# Patient Record
Sex: Female | Born: 1950 | Race: White | Hispanic: No | Marital: Married
Health system: Southern US, Community
[De-identification: ages and names within clinical notes are randomized; demographics above are authoritative.]

## PROBLEM LIST (undated history)

## (undated) DIAGNOSIS — I4891 Unspecified atrial fibrillation: Secondary | ICD-10-CM

## (undated) DIAGNOSIS — I639 Cerebral infarction, unspecified: Secondary | ICD-10-CM

## (undated) DIAGNOSIS — R0600 Dyspnea, unspecified: Secondary | ICD-10-CM

## (undated) DIAGNOSIS — Z95810 Presence of automatic (implantable) cardiac defibrillator: Secondary | ICD-10-CM

## (undated) DIAGNOSIS — I313 Pericardial effusion (noninflammatory): Secondary | ICD-10-CM

## (undated) DIAGNOSIS — E7800 Pure hypercholesterolemia, unspecified: Secondary | ICD-10-CM

## (undated) DIAGNOSIS — I499 Cardiac arrhythmia, unspecified: Secondary | ICD-10-CM

## (undated) DIAGNOSIS — K269 Duodenal ulcer, unspecified as acute or chronic, without hemorrhage or perforation: Secondary | ICD-10-CM

## (undated) DIAGNOSIS — M1990 Unspecified osteoarthritis, unspecified site: Secondary | ICD-10-CM

## (undated) DIAGNOSIS — I3139 Other pericardial effusion (noninflammatory): Secondary | ICD-10-CM

## (undated) DIAGNOSIS — I10 Essential (primary) hypertension: Secondary | ICD-10-CM

## (undated) DIAGNOSIS — I429 Cardiomyopathy, unspecified: Secondary | ICD-10-CM

## (undated) HISTORY — PX: FOOT SURGERY: SHX648

## (undated) HISTORY — DX: Presence of automatic (implantable) cardiac defibrillator: Z95.810

## (undated) HISTORY — DX: Duodenal ulcer, unspecified as acute or chronic, without hemorrhage or perforation: K26.9

## (undated) HISTORY — PX: ABDOMINAL HYSTERECTOMY: SHX81

---

## 1997-12-26 ENCOUNTER — Encounter: Admission: RE | Admit: 1997-12-26 | Discharge: 1998-03-26 | Payer: Self-pay | Admitting: Family Medicine

## 2000-08-22 ENCOUNTER — Emergency Department (HOSPITAL_COMMUNITY): Admission: EM | Admit: 2000-08-22 | Discharge: 2000-08-22 | Payer: Self-pay | Admitting: Emergency Medicine

## 2000-08-22 ENCOUNTER — Encounter: Payer: Self-pay | Admitting: Emergency Medicine

## 2001-03-09 ENCOUNTER — Emergency Department (HOSPITAL_COMMUNITY): Admission: EM | Admit: 2001-03-09 | Discharge: 2001-03-09 | Payer: Self-pay | Admitting: Emergency Medicine

## 2001-03-09 ENCOUNTER — Encounter: Payer: Self-pay | Admitting: Emergency Medicine

## 2001-03-26 ENCOUNTER — Encounter: Admission: RE | Admit: 2001-03-26 | Discharge: 2001-04-13 | Payer: Self-pay | Admitting: Family Medicine

## 2001-04-08 ENCOUNTER — Encounter: Payer: Self-pay | Admitting: Emergency Medicine

## 2001-04-08 ENCOUNTER — Emergency Department (HOSPITAL_COMMUNITY): Admission: EM | Admit: 2001-04-08 | Discharge: 2001-04-09 | Payer: Self-pay | Admitting: Emergency Medicine

## 2001-07-03 ENCOUNTER — Emergency Department (HOSPITAL_COMMUNITY): Admission: EM | Admit: 2001-07-03 | Discharge: 2001-07-03 | Payer: Self-pay | Admitting: *Deleted

## 2001-09-04 ENCOUNTER — Encounter: Payer: Self-pay | Admitting: Family Medicine

## 2001-09-04 ENCOUNTER — Ambulatory Visit (HOSPITAL_COMMUNITY): Admission: RE | Admit: 2001-09-04 | Discharge: 2001-09-04 | Payer: Self-pay | Admitting: Family Medicine

## 2002-05-11 ENCOUNTER — Ambulatory Visit (HOSPITAL_COMMUNITY): Admission: RE | Admit: 2002-05-11 | Discharge: 2002-05-11 | Payer: Self-pay | Admitting: Gastroenterology

## 2002-05-11 ENCOUNTER — Encounter: Payer: Self-pay | Admitting: Gastroenterology

## 2002-05-11 ENCOUNTER — Encounter (INDEPENDENT_AMBULATORY_CARE_PROVIDER_SITE_OTHER): Payer: Self-pay | Admitting: Specialist

## 2002-05-13 ENCOUNTER — Ambulatory Visit (HOSPITAL_COMMUNITY): Admission: RE | Admit: 2002-05-13 | Discharge: 2002-05-13 | Payer: Self-pay | Admitting: Gastroenterology

## 2002-11-11 ENCOUNTER — Ambulatory Visit (HOSPITAL_COMMUNITY): Admission: RE | Admit: 2002-11-11 | Discharge: 2002-11-11 | Payer: Self-pay | Admitting: Gastroenterology

## 2002-11-18 ENCOUNTER — Ambulatory Visit (HOSPITAL_COMMUNITY): Admission: RE | Admit: 2002-11-18 | Discharge: 2002-11-18 | Payer: Self-pay | Admitting: Family Medicine

## 2002-11-18 ENCOUNTER — Encounter: Payer: Self-pay | Admitting: Family Medicine

## 2003-07-27 ENCOUNTER — Encounter: Admission: RE | Admit: 2003-07-27 | Discharge: 2003-10-25 | Payer: Self-pay | Admitting: Family Medicine

## 2003-10-27 ENCOUNTER — Encounter: Admission: RE | Admit: 2003-10-27 | Discharge: 2003-12-19 | Payer: Self-pay | Admitting: Family Medicine

## 2003-12-07 ENCOUNTER — Ambulatory Visit (HOSPITAL_COMMUNITY): Admission: RE | Admit: 2003-12-07 | Discharge: 2003-12-07 | Payer: Self-pay | Admitting: Family Medicine

## 2004-01-24 ENCOUNTER — Encounter: Admission: RE | Admit: 2004-01-24 | Discharge: 2004-04-23 | Payer: Self-pay | Admitting: Orthopedic Surgery

## 2004-02-02 ENCOUNTER — Emergency Department (HOSPITAL_COMMUNITY): Admission: EM | Admit: 2004-02-02 | Discharge: 2004-02-02 | Payer: Self-pay | Admitting: Family Medicine

## 2004-12-12 ENCOUNTER — Ambulatory Visit (HOSPITAL_COMMUNITY): Admission: RE | Admit: 2004-12-12 | Discharge: 2004-12-12 | Payer: Self-pay | Admitting: Family Medicine

## 2005-09-25 ENCOUNTER — Encounter: Admission: RE | Admit: 2005-09-25 | Discharge: 2005-09-25 | Payer: Self-pay | Admitting: Otolaryngology

## 2005-09-26 ENCOUNTER — Encounter: Admission: RE | Admit: 2005-09-26 | Discharge: 2005-12-25 | Payer: Self-pay | Admitting: Psychiatry

## 2005-12-13 ENCOUNTER — Ambulatory Visit (HOSPITAL_COMMUNITY): Admission: RE | Admit: 2005-12-13 | Discharge: 2005-12-13 | Payer: Self-pay | Admitting: Family Medicine

## 2006-01-22 ENCOUNTER — Encounter: Admission: RE | Admit: 2006-01-22 | Discharge: 2006-01-22 | Payer: Self-pay | Admitting: Psychiatry

## 2006-11-28 ENCOUNTER — Ambulatory Visit (HOSPITAL_COMMUNITY): Admission: RE | Admit: 2006-11-28 | Discharge: 2006-11-29 | Payer: Self-pay | Admitting: Otolaryngology

## 2006-11-28 ENCOUNTER — Encounter (INDEPENDENT_AMBULATORY_CARE_PROVIDER_SITE_OTHER): Payer: Self-pay | Admitting: Specialist

## 2006-12-25 ENCOUNTER — Emergency Department (HOSPITAL_COMMUNITY): Admission: EM | Admit: 2006-12-25 | Discharge: 2006-12-25 | Payer: Self-pay | Admitting: Emergency Medicine

## 2007-01-23 ENCOUNTER — Ambulatory Visit (HOSPITAL_COMMUNITY): Admission: RE | Admit: 2007-01-23 | Discharge: 2007-01-23 | Payer: Self-pay | Admitting: Family Medicine

## 2008-01-25 ENCOUNTER — Ambulatory Visit (HOSPITAL_COMMUNITY): Admission: RE | Admit: 2008-01-25 | Discharge: 2008-01-25 | Payer: Self-pay | Admitting: Family Medicine

## 2009-01-31 ENCOUNTER — Ambulatory Visit (HOSPITAL_COMMUNITY): Admission: RE | Admit: 2009-01-31 | Discharge: 2009-01-31 | Payer: Self-pay | Admitting: Family Medicine

## 2010-02-01 ENCOUNTER — Ambulatory Visit (HOSPITAL_COMMUNITY): Admission: RE | Admit: 2010-02-01 | Discharge: 2010-02-01 | Payer: Self-pay | Admitting: Cardiology

## 2010-08-04 ENCOUNTER — Emergency Department (HOSPITAL_COMMUNITY): Admission: EM | Admit: 2010-08-04 | Discharge: 2010-08-04 | Payer: Self-pay | Admitting: Emergency Medicine

## 2010-11-27 LAB — GLUCOSE, CAPILLARY: Glucose-Capillary: 141 mg/dL — ABNORMAL HIGH (ref 70–99)

## 2011-01-09 ENCOUNTER — Other Ambulatory Visit (HOSPITAL_COMMUNITY): Payer: Self-pay | Admitting: Family Medicine

## 2011-01-09 DIAGNOSIS — Z1231 Encounter for screening mammogram for malignant neoplasm of breast: Secondary | ICD-10-CM

## 2011-02-01 NOTE — Op Note (Signed)
NAME:  Linda Lowe, Linda Lowe                            ACCOUNT NO.:  [id number]   MEDICAL RECORD NO.:  [id number]                   PATIENT TYPE:  AMB   LOCATION:  ENDO                                 FACILITY:  [facility]   PHYSICIAN:  Anselmo Rod, M.D.               DATE OF BIRTH:  1951-08-27   DATE OF PROCEDURE:  11/11/2002  DATE OF DISCHARGE:                                 OPERATIVE REPORT   PROCEDURE:  Screening colonoscopy.   ENDOSCOPIST:  Anselmo Rod, M.D.   INSTRUMENT USED:  Olympus video colonoscope.   INDICATION FOR PROCEDURE:  A 60 year old African-American female undergoing  screening colonoscopy.  Rule out colonic polyps, masses, etc.   PREPROCEDURE PREPARATION:  Informed consent was procured from the patient.  The patient fasted for eight hours prior to the procedure and prepped with a  bottle of Miralax and Gatorade the night prior to the procedure.   PREPROCEDURE PHYSICAL:  VITAL SIGNS:  The patient had stable vital signs.  NECK:  Supple.  CHEST:  Clear to auscultation.  S1, S2 regular.  ABDOMEN:  Soft with normal bowel sounds.   DESCRIPTION OF PROCEDURE:  The patient was placed in the left lateral  decubitus position and sedated with 50 mg of Demerol and 5 mg of Versed  intravenously.  Once the patient was adequately sedate and maintained on low-  flow oxygen and continuous cardiac monitoring, the Olympus video colonoscope  was advanced from the rectum to the cecum with slight difficulty.  There was  some residual stool in the colon.  Multiple washes were done.  No masses or  polyps were seen.  There was evidence of a few early right-sided  diverticula.  The appendiceal orifice and the ileocecal valve were clearly  visualized and photographed.  The terminal ileum appeared normal.  Small,  nonbleeding internal hemorrhoids were seen on retroflexion.  The patient  tolerated the procedure well without complications.   IMPRESSION:  1. Small, nonbleeding internal  hemorrhoids.  2. A few early right-sided diverticula.  3. No masses or polyps seen.  4. Normal-appearing terminal ileum.   RECOMMENDATIONS:  1. A high-fiber diet with liberal fluid intake has been advocated.     Brochures on diverticulosis have been given to the patient for her     education.  2.     Repeat CRC screening is recommended in the next 10 years unless the patient      develops any abnormal symptoms in the interim.  3. Outpatient follow-up on a p.r.n. basis.                                               Anselmo Rod, M.D.    JNM/MEDQ  D:  11/11/2002  T:  11/11/2002  Job:  [id number]   cc:   Renaye Rakers, M.D.  [phone] [address]  [place]  [place] [address]  Fax: [phone]

## 2011-02-01 NOTE — Op Note (Signed)
NAMETAMILYN, LUPIEN NO.:  [id number]   MEDICAL RECORD NO.:  [id number]          PATIENT TYPE:  OIB   LOCATION:  3305                         FACILITY:  [facility]   PHYSICIAN:  Suzanna Obey, M.D.       DATE OF BIRTH:  1950/12/20   DATE OF PROCEDURE:  11/28/2006  DATE OF DISCHARGE:  11/29/2006                               OPERATIVE REPORT   PREOPERATIVE DIAGNOSIS:  Obstructive sleep apnea with deviated septum  and turbinate hypertrophy.   POSTOPERATIVE DIAGNOSIS:  Obstructive sleep apnea with deviated septum  and turbinate hypertrophy.   SURGICAL PROCEDURES:  Septoplasty, submucous resection of inferior  turbinates, uvulopharyngeal palatoplasty, and tonsillectomy.   ANESTHESIA:  General.   ESTIMATED BLOOD LOSS:  Approximately 10 mL.   INDICATIONS:  This is a 60 year old with significance obstructive sleep  apnea that has failed CPAP.  She now wants to proceed with the he  surgery and understands the risks of bleeding, infection, scarring,  velopharyngeal insufficiency, change in the voice, chronic crusting and  drying, septal perforation, change in the external appearance of the  nose, injury to the teeth, and risk of the anesthetic.  She also  understands that there is about a 60% at best cure rate of the sleep  apnea from surgery.  All questions were answered and consent was  obtained.   OPERATION:  The patient was taken to the operating room and placed in  supine position. After adequate general endotracheal tube anesthesia,  was placed in supine position, prepped and draped in the usual sterile  manner.  The septum and inferior turbinates were injected with 1%  lidocaine with 1:100,000 epinephrine.  A left hemitransfixion incision  was performed raising a mucoperichondrial and mucoperiosteal flap.  The  cautery was used to divide about 2 cm posterior to the caudal strut and  the cartilage was removed with a Therapist, nutritional.  Portions of the bone  were  removed with the Glorious Peach, as well, and the Takahashi forceps.  This  corrected the septal deflection.  The turbinates were infractured, a  midline incision made with a 15 blade, the mucosal flap elevated  superiorly and the inferior  mucosa and bone were removed with the  turbinate scissors.  The edges was cauterized with suction cautery and  the flaps were laid back down over the raw surface and both turbinates  were outfractured.  The hemitransfixion incision was closed with  interrupted 4-0 chromic and a 4-0 plain gut quilting stitch placed in  the septum.  Telfa rolls soaked in bacitracin were placed in the nose  bilaterally and secured with a 3-0 nylon.   The oral cavity and oropharynx was then addressed.  A Crowe-Davis mouth  gag was inserted, retracted, and suspended from the Mayo stand.  The  palate was cut just at the base of the uvula and the dissection was done  with electrocautery carrying into the left tonsillar fossa where the  capsule of the tonsil was identified and removed with electrocautery  dissection. The dissection was carried across the palate to the other  tonsil and it was also removed to along its capsule and the tissue,  tonsils, and uvula with a portion of the palate were removed en bloc.  Suction cautery was used to gain hemostasis.  The tonsillar fossae and  palate were closed with interrupted 3-0 Vicryl.  Hypopharynx, esophagus, and stomach were suctioned with the NG tube.  The Crowe-Davis was released and resuspended, there was hemostasis  present in all locations.  The patient is awakened and brought to  recovery in stable condition.  Counts correct.           ______________________________  Suzanna Obey, M.D.     JB/MEDQ  D:  11/28/2006  T:  11/29/2006  Job:  [id number]   cc:   Renaye Rakers, M.D.

## 2011-02-01 NOTE — Op Note (Signed)
NAME:  CINCERE, DEPREY                            ACCOUNT NO.:  [id number]   MEDICAL RECORD NO.:  [id number]                   PATIENT TYPE:  AMB   LOCATION:  ENDO                                 FACILITY:  [facility]   PHYSICIAN:  Charna Elizabeth, M.D.                   DATE OF BIRTH:  12-16-1950   DATE OF PROCEDURE:  05/13/2002  DATE OF DISCHARGE:                                 OPERATIVE REPORT   PROCEDURE:  Esophagogastroduodenoscopy with biopsy.   ENDOSCOPIST:  Charna Elizabeth, M.D.   INSTRUMENT USED:  Olympus video panendoscope.   INDICATIONS FOR PROCEDURE:  Nausea and vomiting and epigastric pain with  abnormal abdominal ultrasound and HIDA scan in 60 year old female, rule out  peptic ulcer disease, esophagitis, gastric outlet obstruction.   PREPROCEDURE PREPARATION:  Informed consent was obtained from the patient.  The patient was fasted for eight hours prior to the procedure.   PREPROCEDURE PHYSICAL:  Patient with stable vital signs.  Neck supple.  Chest clear to auscultation.  S1 and S2 regular.  Abdomen soft with normal  bowel sounds.   DESCRIPTION OF PROCEDURE:  The patient was placed in the left lateral  decubitus position, sedated with 60 mg of Demerol and 6 mg Versed  intravenously.  Once the patient was adequately sedated, maintained on low  flow oxygen, continuous cardiac monitoring, the Olympus video panendoscope  was advanced through the mouth piece over the tongue into the esophagus  under direct vision.  The entire esophagus appeared normal without any  evidence of ring, strictures, masses, esophagitis, or Barrett's mucosa.  The  scope was then advanced into the stomach.  There were multiple small antral  and mid body ulcers seen with old heme overlying the small ulcers, there was  no evidence of visible vessels.  Biopsies were done from these areas to rule  out H. pylori.  The proximal small bowel appeared normal and without  lesions.  There was no evidence of outlet  obstruction.   IMPRESSION:  1. Multiple mid body and antral ulcerations, small punched out lesions with     old heme in them, no visible vessels present.  2. Normal appearing esophagus and proximal small bowel.   RECOMMENDATIONS:  1. Prevacid 30 mg 1 p.o. q.d., samples were given to the patient from the     office.  2.     Avoid all nonsteroidals including aspirin has been advised for now.  3. Treat with antibiotics if H. pylori present on pathology.  4. Outpatient follow up in the next two weeks for further recommendations.                                               Charna Elizabeth, M.D.    JM/MEDQ  D:  05/13/2002  T:  05/17/2002  Job:  [id number]   cc:   Geraldo Pitter, M.D.

## 2011-02-04 ENCOUNTER — Ambulatory Visit (HOSPITAL_COMMUNITY)
Admission: RE | Admit: 2011-02-04 | Discharge: 2011-02-04 | Disposition: A | Discharge status: Home / Self Care | Payer: Federal, State, Local not specified - PPO | Source: Ambulatory Visit | Attending: Family Medicine | Admitting: Family Medicine

## 2011-02-04 DIAGNOSIS — Z1231 Encounter for screening mammogram for malignant neoplasm of breast: Secondary | ICD-10-CM

## 2011-04-09 ENCOUNTER — Ambulatory Visit
Admission: RE | Admit: 2011-04-09 | Discharge: 2011-04-09 | Disposition: A | Discharge status: Home / Self Care | Payer: Federal, State, Local not specified - PPO | Source: Ambulatory Visit | Attending: Family Medicine | Admitting: Family Medicine

## 2011-04-09 ENCOUNTER — Other Ambulatory Visit: Payer: Self-pay | Admitting: Family Medicine

## 2011-04-09 DIAGNOSIS — M1250 Traumatic arthropathy, unspecified site: Secondary | ICD-10-CM

## 2011-11-06 DIAGNOSIS — IMO0001 Reserved for inherently not codable concepts without codable children: Secondary | ICD-10-CM | POA: Diagnosis not present

## 2011-12-18 DIAGNOSIS — E7800 Pure hypercholesterolemia, unspecified: Secondary | ICD-10-CM | POA: Diagnosis not present

## 2011-12-18 DIAGNOSIS — IMO0001 Reserved for inherently not codable concepts without codable children: Secondary | ICD-10-CM | POA: Diagnosis not present

## 2011-12-18 DIAGNOSIS — I10 Essential (primary) hypertension: Secondary | ICD-10-CM | POA: Diagnosis not present

## 2012-01-22 ENCOUNTER — Other Ambulatory Visit (HOSPITAL_COMMUNITY): Payer: Self-pay | Admitting: Family Medicine

## 2012-01-22 DIAGNOSIS — Z1231 Encounter for screening mammogram for malignant neoplasm of breast: Secondary | ICD-10-CM

## 2012-02-17 ENCOUNTER — Ambulatory Visit (HOSPITAL_COMMUNITY)
Admission: RE | Admit: 2012-02-17 | Discharge: 2012-02-17 | Disposition: A | Discharge status: Home / Self Care | Payer: Federal, State, Local not specified - PPO | Source: Ambulatory Visit | Attending: Family Medicine | Admitting: Family Medicine

## 2012-02-17 DIAGNOSIS — Z1231 Encounter for screening mammogram for malignant neoplasm of breast: Secondary | ICD-10-CM | POA: Diagnosis not present

## 2012-02-20 DIAGNOSIS — I10 Essential (primary) hypertension: Secondary | ICD-10-CM | POA: Diagnosis not present

## 2012-02-20 DIAGNOSIS — IMO0001 Reserved for inherently not codable concepts without codable children: Secondary | ICD-10-CM | POA: Diagnosis not present

## 2012-02-20 DIAGNOSIS — R809 Proteinuria, unspecified: Secondary | ICD-10-CM | POA: Diagnosis not present

## 2012-02-20 DIAGNOSIS — E7800 Pure hypercholesterolemia, unspecified: Secondary | ICD-10-CM | POA: Diagnosis not present

## 2012-04-23 DIAGNOSIS — IMO0001 Reserved for inherently not codable concepts without codable children: Secondary | ICD-10-CM | POA: Diagnosis not present

## 2012-04-23 DIAGNOSIS — I10 Essential (primary) hypertension: Secondary | ICD-10-CM | POA: Diagnosis not present

## 2012-04-23 DIAGNOSIS — E7800 Pure hypercholesterolemia, unspecified: Secondary | ICD-10-CM | POA: Diagnosis not present

## 2012-05-23 ENCOUNTER — Inpatient Hospital Stay (HOSPITAL_COMMUNITY)
Admission: EM | Admit: 2012-05-23 | Discharge: 2012-05-27 | DRG: 544 | Disposition: A | Discharge status: Home / Self Care | Payer: Federal, State, Local not specified - PPO | Attending: Internal Medicine | Admitting: Internal Medicine

## 2012-05-23 ENCOUNTER — Emergency Department (HOSPITAL_COMMUNITY): Payer: Federal, State, Local not specified - PPO

## 2012-05-23 ENCOUNTER — Encounter (HOSPITAL_COMMUNITY): Payer: Self-pay | Admitting: Physical Medicine and Rehabilitation

## 2012-05-23 DIAGNOSIS — I319 Disease of pericardium, unspecified: Secondary | ICD-10-CM | POA: Diagnosis present

## 2012-05-23 DIAGNOSIS — I509 Heart failure, unspecified: Secondary | ICD-10-CM | POA: Diagnosis present

## 2012-05-23 DIAGNOSIS — I447 Left bundle-branch block, unspecified: Secondary | ICD-10-CM | POA: Diagnosis present

## 2012-05-23 DIAGNOSIS — Z794 Long term (current) use of insulin: Secondary | ICD-10-CM | POA: Diagnosis not present

## 2012-05-23 DIAGNOSIS — Z79899 Other long term (current) drug therapy: Secondary | ICD-10-CM | POA: Diagnosis not present

## 2012-05-23 DIAGNOSIS — I5021 Acute systolic (congestive) heart failure: Principal | ICD-10-CM | POA: Diagnosis present

## 2012-05-23 DIAGNOSIS — E119 Type 2 diabetes mellitus without complications: Secondary | ICD-10-CM | POA: Diagnosis not present

## 2012-05-23 DIAGNOSIS — J189 Pneumonia, unspecified organism: Secondary | ICD-10-CM | POA: Diagnosis present

## 2012-05-23 DIAGNOSIS — M129 Arthropathy, unspecified: Secondary | ICD-10-CM | POA: Diagnosis present

## 2012-05-23 DIAGNOSIS — E7800 Pure hypercholesterolemia, unspecified: Secondary | ICD-10-CM | POA: Diagnosis present

## 2012-05-23 DIAGNOSIS — R599 Enlarged lymph nodes, unspecified: Secondary | ICD-10-CM | POA: Diagnosis present

## 2012-05-23 DIAGNOSIS — I10 Essential (primary) hypertension: Secondary | ICD-10-CM | POA: Diagnosis not present

## 2012-05-23 DIAGNOSIS — R112 Nausea with vomiting, unspecified: Secondary | ICD-10-CM | POA: Diagnosis present

## 2012-05-23 DIAGNOSIS — R110 Nausea: Secondary | ICD-10-CM | POA: Diagnosis not present

## 2012-05-23 DIAGNOSIS — I313 Pericardial effusion (noninflammatory): Secondary | ICD-10-CM

## 2012-05-23 DIAGNOSIS — I3139 Other pericardial effusion (noninflammatory): Secondary | ICD-10-CM

## 2012-05-23 DIAGNOSIS — I428 Other cardiomyopathies: Secondary | ICD-10-CM | POA: Diagnosis present

## 2012-05-23 DIAGNOSIS — Z90710 Acquired absence of both cervix and uterus: Secondary | ICD-10-CM | POA: Diagnosis not present

## 2012-05-23 DIAGNOSIS — I499 Cardiac arrhythmia, unspecified: Secondary | ICD-10-CM | POA: Diagnosis not present

## 2012-05-23 HISTORY — DX: Unspecified osteoarthritis, unspecified site: M19.90

## 2012-05-23 HISTORY — DX: Pericardial effusion (noninflammatory): I31.3

## 2012-05-23 HISTORY — DX: Essential (primary) hypertension: I10

## 2012-05-23 HISTORY — DX: Other pericardial effusion (noninflammatory): I31.39

## 2012-05-23 HISTORY — DX: Pure hypercholesterolemia, unspecified: E78.00

## 2012-05-23 HISTORY — DX: Cardiomyopathy, unspecified: I42.9

## 2012-05-23 LAB — CK TOTAL AND CKMB (NOT AT ARMC)
CK, MB: 3.6 ng/mL (ref 0.3–4.0)
CK, MB: 3.6 ng/mL (ref 0.3–4.0)
Relative Index: 3 — ABNORMAL HIGH (ref 0.0–2.5)
Relative Index: 3.4 — ABNORMAL HIGH (ref 0.0–2.5)
Total CK: 119 U/L (ref 7–177)
Total CK: 106 U/L (ref 7–177)

## 2012-05-23 LAB — COMPREHENSIVE METABOLIC PANEL
ALT: 22 U/L (ref 0–35)
AST: 22 U/L (ref 0–37)
Albumin: 3.8 g/dL (ref 3.5–5.2)
Alkaline Phosphatase: 127 U/L — ABNORMAL HIGH (ref 39–117)
BUN: 12 mg/dL (ref 6–23)
CO2: 25 mEq/L (ref 19–32)
Calcium: 9.1 mg/dL (ref 8.4–10.5)
Chloride: 101 mEq/L (ref 96–112)
Creatinine, Ser: 0.61 mg/dL (ref 0.50–1.10)
GFR calc Af Amer: >90 mL/min (ref >90)
GFR calc non Af Amer: >90 mL/min (ref >90)
Glucose, Bld: 271 mg/dL — ABNORMAL HIGH (ref 70–99)
Potassium: 3 mEq/L — ABNORMAL LOW (ref 3.5–5.1)
Sodium: 138 mEq/L (ref 135–145)
Total Bilirubin: 0.4 mg/dL (ref 0.3–1.2)
Total Protein: 7.6 g/dL (ref 6.0–8.3)

## 2012-05-23 LAB — CBC
HCT: 41.5 % (ref 36.0–46.0)
HCT: 39.7 % (ref 36.0–46.0)
Hemoglobin: 14.3 g/dL (ref 12.0–15.0)
Hemoglobin: 13.3 g/dL (ref 12.0–15.0)
MCH: 28.7 pg (ref 26.0–34.0)
MCH: 28.2 pg (ref 26.0–34.0)
MCHC: 34.5 g/dL (ref 30.0–36.0)
MCHC: 33.5 g/dL (ref 30.0–36.0)
MCV: 83.3 fL (ref 78.0–100.0)
MCV: 84.3 fL (ref 78.0–100.0)
Platelets: 248 10*3/uL (ref 150–400)
Platelets: 255 10*3/uL (ref 150–400)
RBC: 4.98 MIL/uL (ref 3.87–5.11)
RBC: 4.71 MIL/uL (ref 3.87–5.11)
RDW: 13.3 % (ref 11.5–15.5)
RDW: 13.4 % (ref 11.5–15.5)
WBC: 9.3 10*3/uL (ref 4.0–10.5)
WBC: 10.3 10*3/uL (ref 4.0–10.5)

## 2012-05-23 LAB — HEMOGLOBIN A1C
Hgb A1c MFr Bld: 9.3 % — ABNORMAL HIGH (ref <5.7)
Mean Plasma Glucose: 220 mg/dL — ABNORMAL HIGH (ref <117)

## 2012-05-23 LAB — CREATININE, SERUM
Creatinine, Ser: 0.76 mg/dL (ref 0.50–1.10)
GFR calc Af Amer: >90 mL/min (ref >90)
GFR calc non Af Amer: >90 mL/min (ref >90)

## 2012-05-23 LAB — TROPONIN I: Troponin I: <0.3 ng/mL (ref <0.30)

## 2012-05-23 LAB — LIPASE, BLOOD: Lipase: 20 U/L (ref 11–59)

## 2012-05-23 LAB — GLUCOSE, CAPILLARY: Glucose-Capillary: 239 mg/dL — ABNORMAL HIGH (ref 70–99)

## 2012-05-23 LAB — PRO B NATRIURETIC PEPTIDE: Pro B Natriuretic peptide (BNP): 1854 pg/mL — ABNORMAL HIGH (ref 0–125)

## 2012-05-23 MED ORDER — FUROSEMIDE 10 MG/ML IJ SOLN
40.0000 mg | Freq: Two times a day (BID) | INTRAMUSCULAR | Status: DC
  Administered 2012-05-23 – 2012-05-26 (×6): 40 mg via INTRAVENOUS
  Filled 2012-05-23 (×9): qty 4

## 2012-05-23 MED ORDER — POTASSIUM CHLORIDE CRYS ER 20 MEQ PO TBCR
40.0000 meq | EXTENDED_RELEASE_TABLET | Freq: Every day | ORAL | Status: DC
  Administered 2012-05-23 – 2012-05-27 (×4): 40 meq via ORAL
  Filled 2012-05-23 (×5): qty 2

## 2012-05-23 MED ORDER — DEXTROSE 5 % IV SOLN
1.0000 g | Freq: Once | INTRAVENOUS | Status: AC
  Administered 2012-05-23: 1 g via INTRAVENOUS
  Filled 2012-05-23: qty 10

## 2012-05-23 MED ORDER — ENOXAPARIN SODIUM 40 MG/0.4ML ~~LOC~~ SOLN
40.0000 mg | SUBCUTANEOUS | Status: DC
  Administered 2012-05-23 – 2012-05-25 (×3): 40 mg via SUBCUTANEOUS
  Filled 2012-05-23 (×5): qty 0.4

## 2012-05-23 MED ORDER — OXYBUTYNIN CHLORIDE ER 5 MG PO TB24
5.0000 mg | ORAL_TABLET | Freq: Every day | ORAL | Status: DC
  Administered 2012-05-23 – 2012-05-27 (×5): 5 mg via ORAL
  Filled 2012-05-23 (×5): qty 1

## 2012-05-23 MED ORDER — ACETAMINOPHEN 325 MG PO TABS
650.0000 mg | ORAL_TABLET | Freq: Four times a day (QID) | ORAL | Status: DC | PRN
  Administered 2012-05-23 – 2012-05-26 (×6): 650 mg via ORAL
  Filled 2012-05-23 (×6): qty 2

## 2012-05-23 MED ORDER — MORPHINE SULFATE 4 MG/ML IJ SOLN
6.0000 mg | Freq: Once | INTRAMUSCULAR | Status: AC
  Administered 2012-05-23: 6 mg via INTRAVENOUS
  Filled 2012-05-23: qty 2

## 2012-05-23 MED ORDER — INSULIN ASPART 100 UNIT/ML ~~LOC~~ SOLN
0.0000 [IU] | Freq: Three times a day (TID) | SUBCUTANEOUS | Status: DC
  Administered 2012-05-23: 3 [IU] via SUBCUTANEOUS

## 2012-05-23 MED ORDER — ASPIRIN 325 MG PO TABS
325.0000 mg | ORAL_TABLET | Freq: Once | ORAL | Status: AC
  Administered 2012-05-23: 325 mg via ORAL
  Filled 2012-05-23: qty 1

## 2012-05-23 MED ORDER — SIMVASTATIN 40 MG PO TABS
40.0000 mg | ORAL_TABLET | Freq: Every day | ORAL | Status: DC
  Administered 2012-05-23 – 2012-05-25 (×3): 40 mg via ORAL
  Filled 2012-05-23 (×5): qty 1

## 2012-05-23 MED ORDER — INSULIN ASPART 100 UNIT/ML ~~LOC~~ SOLN
8.0000 [IU] | Freq: Three times a day (TID) | SUBCUTANEOUS | Status: DC
  Administered 2012-05-23 – 2012-05-25 (×5): 8 [IU] via SUBCUTANEOUS

## 2012-05-23 MED ORDER — MONTELUKAST SODIUM 10 MG PO TABS
10.0000 mg | ORAL_TABLET | Freq: Every day | ORAL | Status: DC
  Administered 2012-05-23 – 2012-05-26 (×4): 10 mg via ORAL
  Filled 2012-05-23 (×5): qty 1

## 2012-05-23 MED ORDER — DEXTROSE 5 % IV SOLN
500.0000 mg | Freq: Once | INTRAVENOUS | Status: AC
  Administered 2012-05-23: 500 mg via INTRAVENOUS
  Filled 2012-05-23: qty 500

## 2012-05-23 MED ORDER — METOPROLOL TARTRATE 12.5 MG HALF TABLET
12.5000 mg | ORAL_TABLET | Freq: Two times a day (BID) | ORAL | Status: DC
  Administered 2012-05-23 – 2012-05-25 (×4): 12.5 mg via ORAL
  Filled 2012-05-23 (×5): qty 1

## 2012-05-23 MED ORDER — INSULIN DETEMIR 100 UNIT/ML ~~LOC~~ SOLN
10.0000 [IU] | Freq: Two times a day (BID) | SUBCUTANEOUS | Status: DC
  Administered 2012-05-23 – 2012-05-25 (×4): 10 [IU] via SUBCUTANEOUS
  Filled 2012-05-23: qty 10

## 2012-05-23 MED ORDER — ONDANSETRON HCL 4 MG/2ML IJ SOLN: 4.0000 mg | Freq: Four times a day (QID) | INTRAMUSCULAR | Status: DC | PRN

## 2012-05-23 MED ORDER — DEXTROSE 5 % IV SOLN
500.0000 mg | INTRAVENOUS | Status: DC
  Administered 2012-05-24 – 2012-05-25 (×2): 500 mg via INTRAVENOUS
  Filled 2012-05-23 (×2): qty 500

## 2012-05-23 MED ORDER — ASPIRIN 325 MG PO TABS
325.0000 mg | ORAL_TABLET | Freq: Every day | ORAL | Status: DC
  Administered 2012-05-23: 325 mg via ORAL
  Filled 2012-05-23 (×2): qty 1

## 2012-05-23 MED ORDER — ONDANSETRON HCL 4 MG/2ML IJ SOLN
4.0000 mg | Freq: Once | INTRAMUSCULAR | Status: AC
  Administered 2012-05-23: 4 mg via INTRAVENOUS
  Filled 2012-05-23: qty 2

## 2012-05-23 MED ORDER — ACETAMINOPHEN 650 MG RE SUPP: 650.0000 mg | Freq: Four times a day (QID) | RECTAL | Status: DC | PRN

## 2012-05-23 MED ORDER — DEXTROSE 5 % IV SOLN
1.0000 g | INTRAVENOUS | Status: DC
  Administered 2012-05-24 – 2012-05-25 (×2): 1 g via INTRAVENOUS
  Filled 2012-05-23 (×2): qty 10

## 2012-05-23 NOTE — ED Provider Notes (Signed)
History     CSN: [id number]  Arrival date & time 05/23/12  0910   First MD Initiated Contact with Patient 05/23/12 0930      Chief Complaint  Patient presents with  . Nausea  . Emesis  . Cough    (Consider location/radiation/quality/duration/timing/severity/associated sxs/prior treatment) The history is provided by the patient.   patient reports 2 weeks of cough and congestion.  She's had chills at home.  She has no documented fever.  Today she developed acute onset nausea and vomiting associated with coughing.  She reports generalized weakness as well.  Her EKG demonstrates a left bundle branch block which is new for her.  She has no prior history of coronary disease or congestive heart failure.  She does have diabetes hypertension and hypercholesterolemia.  She had some mild chest discomfort earlier today which seems to resolve.  She has had some shortness of breath.  She denies unilateral leg swelling.  No history of DVT or pulmonary and wasn't.  Past Medical History  Diagnosis Date  . Diabetes mellitus   . Hypertension   . Hypercholesteremia     No past surgical history on file.  No family history on file.  History  Substance Use Topics  . Smoking status: Never Smoker   . Smokeless tobacco: Not on file  . Alcohol Use: No    OB History    Grav Para Term Preterm Abortions TAB SAB Ect Mult Living                  Review of Systems  All other systems reviewed and are negative.    Allergies  Review of patient's allergies indicates no known allergies.  Home Medications   Current Outpatient Rx  Name Route Sig Dispense Refill  . GLYBURIDE-METFORMIN 1.25-250 MG PO TABS Oral Take 1 tablet by mouth 2 (two) times daily with a meal.    . NOVOLOG FLEXPEN [place] Subcutaneous Inject 18-25 Units into the skin 2 (two) times daily with a meal.    . LEVEMIR FLEXPEN [place] Subcutaneous Inject 10 Units into the skin 2 (two) times daily with a meal.    . MONTELUKAST SODIUM 10 MG PO  TABS Oral Take 10 mg by mouth at bedtime.    Marland Kitchen NAPROXEN 500 MG PO TABS Oral Take 500 mg by mouth 2 (two) times daily with a meal.    . OXYBUTYNIN CHLORIDE ER 5 MG PO TB24 Oral Take 5 mg by mouth daily.    Marland Kitchen POTASSIUM CHLORIDE CRYS ER 20 MEQ PO TBCR Oral Take 20 mEq by mouth daily.    Marland Kitchen SAXAGLIPTIN HCL 5 MG PO TABS Oral Take 5 mg by mouth daily.    Marland Kitchen SIMVASTATIN 40 MG PO TABS Oral Take 40 mg by mouth every evening.    Marland Kitchen SITAGLIPTIN-METFORMIN HCL 50-500 MG PO TABS Oral Take 1 tablet by mouth 2 (two) times daily with a meal.      BP 155/84  Pulse 72  Temp 97.9 F (36.6 C) (Oral)  Resp 20  SpO2 97%  Physical Exam  Nursing note and vitals reviewed. Constitutional: She is oriented to person, place, and time. She appears well-developed and well-nourished. No distress.  HENT:  Head: Normocephalic and atraumatic.  Eyes: EOM are normal.  Neck: Normal range of motion.  Cardiovascular: Normal rate, regular rhythm and normal heart sounds.   Pulmonary/Chest: Effort normal. She has no wheezes.       Rhonchi   Abdominal: Soft. She exhibits no  distension. There is no tenderness.  Musculoskeletal: Normal range of motion.  Neurological: She is alert and oriented to person, place, and time.  Skin: Skin is warm and dry.  Psychiatric: She has a normal mood and affect. Judgment normal.    ED Course  Procedures (including critical care time)   Date: 05/23/2012  Rate: 77  Rhythm: normal sinus rhythm  QRS Axis: normal  Intervals:LBBB  ST/T Wave abnormalities: normal  Conduction Disutrbances: none  Narrative Interpretation:   Old EKG Reviewed: LBBB is new since priorNo significant changes noted     Labs Reviewed  COMPREHENSIVE METABOLIC PANEL - Abnormal; Notable for the following:    Potassium 3.0 (*)     Glucose, Bld 271 (*)     Alkaline Phosphatase 127 (*)     All other components within normal limits  PRO B NATRIURETIC PEPTIDE - Abnormal; Notable for the following:    Pro B  Natriuretic peptide (BNP) 1854.0 (*)     All other components within normal limits  CBC  LIPASE, BLOOD  TROPONIN I   Dg Chest 2 View  05/23/2012  *RADIOLOGY REPORT*  Clinical Data: Nausea, emesis and cough.  CHEST - 2 VIEW  Comparison: Chest x-ray 11/26/2006.  Findings: Lung volumes are normal.  There is cephalization of the pulmonary vasculature, increased interstitial markings and patchy airspace disease scattered throughout the lungs bilaterally, most suspicious for moderate pulmonary edema (strictly speaking, multilobar pneumonia could have a similar appearance).  No definite pleural effusions.  Moderate cardiomegaly.  Mediastinal contours are otherwise unremarkable.  IMPRESSION: 1.  Findings, as above, most concerning for congestive heart failure.  Strictly speaking, the multifocal interstitial and airspace disease could also relate to multilobar pneumonia. Clinical correlation is recommended.   Original Report Authenticated By: Florencia Reasons, M.D.     I personally reviewed the imaging tests through PACS system  I reviewed available ER/hospitalization records thought the EMR   1. CAP (community acquired pneumonia)       MDM  The patient be meds the hospital for multifocal pneumonia versus diffuse pulmonary infiltrates suggestive of congestive heart failure.  BNP is elevated.  Her history are as per suggestive of pneumonia.  She recovered for community acquired pneumonia.  The patient be admitted to the hospitalist service.        Lyanne Co, MD 05/23/12 [phone]

## 2012-05-23 NOTE — ED Notes (Signed)
Pt presents to department via [facility] for evaluation of nausea/vomiting and productive cough. States generalized weakness and green colored sputum. LBBB noted on EKG. Pt is conscious alert and oriented x4.

## 2012-05-23 NOTE — ED Notes (Signed)
Transferred to [id number] via stretcher report called to [employer]

## 2012-05-23 NOTE — Progress Notes (Signed)
PCP -Renaye Rakers 60/F w/ cough/congestion/chills CXR w/ Multifocal pneumonia, BNP elevated too Accepted to team 10/tele  Zannie Cove, MD [phone]

## 2012-05-23 NOTE — ED Notes (Signed)
Denies pain or nausea 

## 2012-05-23 NOTE — H&P (Signed)
[facility]          History and Physical    PCP:   Geraldo Pitter, MD   Chief Complaint:  Shortness of breath  HPI: Ms.Atteberry is a 60/F with PMH of HTN, DM reports progressive dyspnea on exertion for 2 weeks, associated with cough initially dry then productive of white phlegm. She also experienced nausea and an episode of post tussive vomiting associated with chest tightness and pain up her chest briefly earlier today which has since resolved. She denies any fevers, reports chills Denies PND/orthopnea or swelling of feet. EValuation in ER noted CXR w/ volume overload vs interstitial pneumonia She was started on Rocephin and Zithromax in ER Allergies:  No Known Allergies    Past Medical History  Diagnosis Date  . Diabetes mellitus   . Hypertension   . Hypercholesteremia     No past surgical history on file.  Prior to Admission medications   Medication Sig Start Date End Date Taking? Authorizing Provider  glyBURIDE-metformin (GLUCOVANCE) 1.25-250 MG per tablet Take 1 tablet by mouth 2 (two) times daily with a meal.   Yes Historical Provider, MD  Insulin Aspart (NOVOLOG FLEXPEN [place]) Inject 18-25 Units into the skin 2 (two) times daily with a meal.   Yes Historical Provider, MD  Insulin Detemir (LEVEMIR FLEXPEN [place]) Inject 10 Units into the skin 2 (two) times daily with a meal.   Yes Historical Provider, MD  montelukast (SINGULAIR) 10 MG tablet Take 10 mg by mouth at bedtime.   Yes Historical Provider, MD  naproxen (NAPROSYN) 500 MG tablet Take 500 mg by mouth 2 (two) times daily with a meal.   Yes Historical Provider, MD  oxybutynin (DITROPAN-XL) 5 MG 24 hr tablet Take 5 mg by mouth daily.   Yes Historical Provider, MD  potassium chloride SA (K-DUR,KLOR-CON) 20 MEQ tablet Take 20 mEq by mouth daily.   Yes Historical Provider, MD  saxagliptin HCl (ONGLYZA) 5 MG TABS tablet Take 5 mg by mouth daily.   Yes Historical Provider, MD  simvastatin (ZOCOR) 40 MG tablet Take  40 mg by mouth every evening.   Yes Historical Provider, MD  sitaGLIPtan-metformin (JANUMET) 50-500 MG per tablet Take 1 tablet by mouth 2 (two) times daily with a meal.   Yes Historical Provider, MD    Social History:  reports that she has never smoked. She does not have any smokeless tobacco history on file. She reports that she does not drink alcohol or use illicit drugs.  No family history on file.  Review of Systems:  Constitutional: Denies fever, chills, diaphoresis, appetite change and fatigue.  HEENT: Denies photophobia, eye pain, redness, hearing loss, ear pain, congestion, sore throat, rhinorrhea, sneezing, mouth sores, trouble swallowing, neck pain, neck stiffness and tinnitus.   Respiratory: Denies SOB, DOE, cough, chest tightness,  and wheezing.   Cardiovascular: Denies chest pain, palpitations and leg swelling.  Gastrointestinal: Denies nausea, vomiting, abdominal pain, diarrhea, constipation, blood in stool and abdominal distention.  Genitourinary: Denies dysuria, urgency, frequency, hematuria, flank pain and difficulty urinating.  Musculoskeletal: Denies myalgias, back pain, joint swelling, arthralgias and gait problem.  Skin: Denies pallor, rash and wound.  Neurological: Denies dizziness, seizures, syncope, weakness, light-headedness, numbness and headaches.  Hematological: Denies adenopathy. Easy bruising, personal or family bleeding history  Psychiatric/Behavioral: Denies suicidal ideation, mood changes, confusion, nervousness, sleep disturbance and agitation   Physical Exam: Blood pressure 159/85, pulse 68, temperature 97.5 F (36.4 C), temperature source Oral, resp. rate 17, SpO2 96.00%. Gen:  AAOx3,  HEENT: PERRLA, EOMI, JVD 2 plus CVS: S1S2/RRR Lungs: bibasilar crackles Abd: soft,NT,BS present EXT: trace edema, no c/c Neuro: moves all ext, non focal Skin: no rashes or skin breakdown  Labs on Admission:  Results for orders placed during the hospital encounter  of 05/23/12 (from the past 48 hour(s))  CBC     Status: Normal   Collection Time   05/23/12  9:40 AM      Component Value Range Comment   WBC 9.3  4.0 - 10.5 K/uL    RBC 4.98  3.87 - 5.11 MIL/uL    Hemoglobin 14.3  12.0 - 15.0 g/dL    HCT 16.1  09.6 - 04.5 %    MCV 83.3  78.0 - 100.0 fL    MCH 28.7  26.0 - 34.0 pg    MCHC 34.5  30.0 - 36.0 g/dL    RDW 40.9  81.1 - 91.4 %    Platelets 248  150 - 400 K/uL   COMPREHENSIVE METABOLIC PANEL     Status: Abnormal   Collection Time   05/23/12  9:40 AM      Component Value Range Comment   Sodium 138  135 - 145 mEq/L    Potassium 3.0 (*) 3.5 - 5.1 mEq/L    Chloride 101  96 - 112 mEq/L    CO2 25  19 - 32 mEq/L    Glucose, Bld 271 (*) 70 - 99 mg/dL    BUN 12  6 - 23 mg/dL    Creatinine, Ser 7.82  0.50 - 1.10 mg/dL    Calcium 9.1  8.4 - 95.6 mg/dL    Total Protein 7.6  6.0 - 8.3 g/dL    Albumin 3.8  3.5 - 5.2 g/dL    AST 22  0 - 37 U/L    ALT 22  0 - 35 U/L    Alkaline Phosphatase 127 (*) 39 - 117 U/L    Total Bilirubin 0.4  0.3 - 1.2 mg/dL    GFR calc non Af Amer >90  >90 mL/min    GFR calc Af Amer >90  >90 mL/min   LIPASE, BLOOD     Status: Normal   Collection Time   05/23/12  9:40 AM      Component Value Range Comment   Lipase 20  11 - 59 U/L   TROPONIN I     Status: Normal   Collection Time   05/23/12  9:42 AM      Component Value Range Comment   Troponin I <0.30  <0.30 ng/mL   PRO B NATRIURETIC PEPTIDE     Status: Abnormal   Collection Time   05/23/12 11:09 AM      Component Value Range Comment   Pro B Natriuretic peptide (BNP) 1854.0 (*) 0 - 125 pg/mL     Radiological Exams on Admission: Dg Chest 2 View  05/23/2012  *RADIOLOGY REPORT*  Clinical Data: Nausea, emesis and cough.  CHEST - 2 VIEW  Comparison: Chest x-ray 11/26/2006.  Findings: Lung volumes are normal.  There is cephalization of the pulmonary vasculature, increased interstitial markings and patchy airspace disease scattered throughout the lungs bilaterally, most  suspicious for moderate pulmonary edema (strictly speaking, multilobar pneumonia could have a similar appearance).  No definite pleural effusions.  Moderate cardiomegaly.  Mediastinal contours are otherwise unremarkable.  IMPRESSION: 1.  Findings, as above, most concerning for congestive heart failure.  Strictly speaking, the multifocal interstitial and airspace disease could also relate  to multilobar pneumonia. Clinical correlation is recommended.   Original Report Authenticated By: Florencia Reasons, M.D.     Assessment/Plan 1.  CHF exacerbation: Clinically Suspect CHF exacerbation, over pneumonia Start IV lasix 40mg  Q12 Start low dose metoprolol 2D echo I/Os Daily weights Will keep on antibiotics for now and repeat CXR in am, could DC abx soon if CXR improving   2.  DM (diabetes mellitus): continue levemir/novolog with meals and SSI  3.  LBBB: denies any chest pain currently, no old EKG for comparison, cycle cardiac enzymes and FU echo Start ASA  4. HTN: stable  DVT prophylaxis: lovenox   Time Spent on Admission:  Denese Mentink [facility] Pager: [phone] 05/23/2012, 2:53 PM

## 2012-05-24 ENCOUNTER — Inpatient Hospital Stay (HOSPITAL_COMMUNITY): Payer: Federal, State, Local not specified - PPO

## 2012-05-24 DIAGNOSIS — I319 Disease of pericardium, unspecified: Secondary | ICD-10-CM

## 2012-05-24 LAB — CBC
HCT: 40.4 % (ref 36.0–46.0)
Hemoglobin: 13.3 g/dL (ref 12.0–15.0)
MCH: 28.1 pg (ref 26.0–34.0)
MCHC: 32.9 g/dL (ref 30.0–36.0)
MCV: 85.2 fL (ref 78.0–100.0)
Platelets: 263 10*3/uL (ref 150–400)
RBC: 4.74 MIL/uL (ref 3.87–5.11)
RDW: 13.5 % (ref 11.5–15.5)
WBC: 10.6 10*3/uL — ABNORMAL HIGH (ref 4.0–10.5)

## 2012-05-24 LAB — BASIC METABOLIC PANEL
BUN: 17 mg/dL (ref 6–23)
CO2: 27 mEq/L (ref 19–32)
Calcium: 9.2 mg/dL (ref 8.4–10.5)
Chloride: 100 mEq/L (ref 96–112)
Creatinine, Ser: 0.56 mg/dL (ref 0.50–1.10)
GFR calc Af Amer: >90 mL/min (ref >90)
GFR calc non Af Amer: >90 mL/min (ref >90)
Glucose, Bld: 222 mg/dL — ABNORMAL HIGH (ref 70–99)
Potassium: 3.4 mEq/L — ABNORMAL LOW (ref 3.5–5.1)
Sodium: 139 mEq/L (ref 135–145)

## 2012-05-24 LAB — GLUCOSE, CAPILLARY
Glucose-Capillary: 250 mg/dL — ABNORMAL HIGH (ref 70–99)
Glucose-Capillary: 215 mg/dL — ABNORMAL HIGH (ref 70–99)
Glucose-Capillary: 255 mg/dL — ABNORMAL HIGH (ref 70–99)
Glucose-Capillary: 212 mg/dL — ABNORMAL HIGH (ref 70–99)
Glucose-Capillary: 149 mg/dL — ABNORMAL HIGH (ref 70–99)

## 2012-05-24 MED ORDER — ASPIRIN 81 MG PO CHEW
81.0000 mg | CHEWABLE_TABLET | Freq: Every day | ORAL | Status: DC
  Administered 2012-05-24 – 2012-05-27 (×4): 81 mg via ORAL
  Filled 2012-05-24: qty 4
  Filled 2012-05-24 (×4): qty 1

## 2012-05-24 MED ORDER — INSULIN ASPART 100 UNIT/ML ~~LOC~~ SOLN
0.0000 [IU] | Freq: Every day | SUBCUTANEOUS | Status: DC
  Administered 2012-05-26: 10 [IU] via SUBCUTANEOUS

## 2012-05-24 MED ORDER — PNEUMOCOCCAL VAC POLYVALENT 25 MCG/0.5ML IJ INJ
0.5000 mL | INJECTION | INTRAMUSCULAR | Status: AC
  Administered 2012-05-25: 0.5 mL via INTRAMUSCULAR
  Filled 2012-05-24: qty 0.5

## 2012-05-24 MED ORDER — INSULIN ASPART 100 UNIT/ML ~~LOC~~ SOLN
0.0000 [IU] | Freq: Three times a day (TID) | SUBCUTANEOUS | Status: DC
  Administered 2012-05-24 (×2): 5 [IU] via SUBCUTANEOUS
  Administered 2012-05-24: 8 [IU] via SUBCUTANEOUS
  Administered 2012-05-25: 3 [IU] via SUBCUTANEOUS
  Administered 2012-05-25 (×2): 8 [IU] via SUBCUTANEOUS
  Administered 2012-05-27 (×2): 3 [IU] via SUBCUTANEOUS

## 2012-05-24 NOTE — Progress Notes (Signed)
[facility] PROGRESS NOTE  Linda Lowe ZOX:[id number] DOB: September 19, 1950 DOA: 05/23/2012 PCP: Geraldo Pitter, MD  Assessment/Plan: Acute CHF exacerbation - -1375 cc last night -Continue intravenous furosemide -Await echocardiogram Hypertension -Poorly controlled -Continue metoprolol tartrate, suspect blood pressure will come down with continued diuresis -Plan to switch to metoprolol succinate if no contraindications at discharge Diabetes mellitus type 2 -Patient has very poor insight into her medications -Unusual outpatient regimen -Will try to streamline regimen, hemoglobin A1c 9.3 -Check morning lipids -Monitor sugars for now, and adjust regimen -Hold oral hypoglycemics for now Pulmonary infiltrates -Cannot rule out underlying pneumonia -Followup chest x-ray after continued diuresis   Procedures/Studies: Dg Chest 2 View  05/23/2012  *RADIOLOGY REPORT*  Clinical Data: Nausea, emesis and cough.  CHEST - 2 VIEW  Comparison: Chest x-ray 11/26/2006.  Findings: Lung volumes are normal.  There is cephalization of the pulmonary vasculature, increased interstitial markings and patchy airspace disease scattered throughout the lungs bilaterally, most suspicious for moderate pulmonary edema (strictly speaking, multilobar pneumonia could have a similar appearance).  No definite pleural effusions.  Moderate cardiomegaly.  Mediastinal contours are otherwise unremarkable.  IMPRESSION: 1.  Findings, as above, most concerning for congestive heart failure.  Strictly speaking, the multifocal interstitial and airspace disease could also relate to multilobar pneumonia. Clinical correlation is recommended.   Original Report Authenticated By: Florencia Reasons, M.D.    **  Antibiotics:  Ceftriaxone September 7>>>  Azithromycin September 7>>>   Subjective: Patient complaint of headache this morning that is relieved with acetaminophen. Her breathing is a little bit better than yesterday. She did  have orthopnea prior to coming to the hospital. She denies any hemoptysis, chest pain, nausea, vomiting, diarrhea, abdominal pain, dysuria, fever, chills   Objective: Filed Vitals:   05/23/12 1700 05/23/12 2119 05/24/12 0200 05/24/12 0446  BP: 131/79 158/69 147/74 161/80  Pulse:  66 70 82  Temp:  97.7 F (36.5 C) 98.2 F (36.8 C) 98.5 F (36.9 C)  TempSrc:  Oral Oral Oral  Resp:  21 22 20   Height:      Weight:    84.732 kg (186 lb 12.8 oz)  SpO2:  90% 96% 96%    Intake/Output Summary (Last 24 hours) at 05/24/12 0715 Last data filed at 05/24/12 0300  Gross per 24 hour  Intake      0 ml  Output   1375 ml  Net  -1375 ml   Weight change:  Exam:   General:  Pt is alert, follows commands appropriately, not in acute distress  HEENT: No icterus, No thrush,  [place]/AT  Cardiovascular: Regular rate and rhythm, S1/S2, no rubs, no gallops  Respiratory: Bilateral crackles, no wheezes, good air movement, no rhonchi   Abdomen: Soft, non tender, non distended, bowel sounds present, no guarding  Extremities: 1+ edema, No lymphangitis, No petechiae, No rashes, no synovitis  Data Reviewed: Basic Metabolic Panel:  Lab 05/23/12 8119 05/23/12 0940  NA -- 138  K -- 3.0*  CL -- 101  CO2 -- 25  GLUCOSE -- 271*  BUN -- 12  CREATININE 0.76 0.61  CALCIUM -- 9.1  MG -- --  PHOS -- --   Liver Function Tests:  Lab 05/23/12 0940  AST 22  ALT 22  ALKPHOS 127*  BILITOT 0.4  PROT 7.6  ALBUMIN 3.8    Lab 05/23/12 0940  LIPASE 20  AMYLASE --   No results found for this basename: AMMONIA:5 in the last 168 hours CBC:  Lab 05/24/12  0515 05/23/12 1701 05/23/12 0940  WBC 10.6* 10.3 9.3  NEUTROABS -- -- --  HGB 13.3 13.3 14.3  HCT 40.4 39.7 41.5  MCV 85.2 84.3 83.3  PLT 263 255 248   Cardiac Enzymes:  Lab 05/23/12 2043 05/23/12 1656 05/23/12 0942  CKTOTAL 106 119 --  CKMB 3.6 3.6 --  CKMBINDEX -- -- --  TROPONINI -- -- <0.30   BNP: No components found with this basename:  POCBNP:5 CBG:  Lab 05/23/12 2143 05/23/12 1518  GLUCAP 250* 239*    No results found for this or any previous visit (from the past 240 hour(s)).   Scheduled Meds:   . aspirin  325 mg Oral Once  . aspirin  325 mg Oral Daily  . azithromycin (ZITHROMAX) 500 MG IVPB  500 mg Intravenous Once  . azithromycin (ZITHROMAX) 500 MG IVPB  500 mg Intravenous Q24H  . cefTRIAXone (ROCEPHIN)  IV  1 g Intravenous Once  . cefTRIAXone (ROCEPHIN)  IV  1 g Intravenous Q24H  . enoxaparin (LOVENOX) injection  40 mg Subcutaneous Q24H  . furosemide  40 mg Intravenous Q12H  . insulin aspart  0-9 Units Subcutaneous TID WC  . insulin aspart  8 Units Subcutaneous TID WC  . insulin detemir  10 Units Subcutaneous BID  . metoprolol tartrate  12.5 mg Oral BID  . montelukast  10 mg Oral QHS  .  morphine injection  6 mg Intravenous Once  . ondansetron (ZOFRAN) IV  4 mg Intravenous Once  . oxybutynin  5 mg Oral Daily  . potassium chloride SA  40 mEq Oral Daily  . simvastatin  40 mg Oral q1800   Continuous Infusions:    Richanda Darin, DO  [facility] Pager [phone]  If 7PM-7AM, please contact night-coverage [email or web address] Password [email or web address] 05/24/2012, 7:15 AM   LOS: 1 day

## 2012-05-24 NOTE — Progress Notes (Signed)
  Echocardiogram 2D Echocardiogram has been performed.  Tanisa Lagace FRANCES 05/24/2012, 9:19 AM

## 2012-05-25 ENCOUNTER — Inpatient Hospital Stay (HOSPITAL_COMMUNITY): Payer: Federal, State, Local not specified - PPO

## 2012-05-25 ENCOUNTER — Encounter (HOSPITAL_COMMUNITY): Payer: Self-pay | Admitting: Nurse Practitioner

## 2012-05-25 DIAGNOSIS — I313 Pericardial effusion (noninflammatory): Secondary | ICD-10-CM

## 2012-05-25 DIAGNOSIS — I5021 Acute systolic (congestive) heart failure: Principal | ICD-10-CM

## 2012-05-25 DIAGNOSIS — I319 Disease of pericardium, unspecified: Secondary | ICD-10-CM

## 2012-05-25 DIAGNOSIS — I3139 Other pericardial effusion (noninflammatory): Secondary | ICD-10-CM

## 2012-05-25 DIAGNOSIS — I509 Heart failure, unspecified: Secondary | ICD-10-CM

## 2012-05-25 LAB — GLUCOSE, CAPILLARY
Glucose-Capillary: 195 mg/dL — ABNORMAL HIGH (ref 70–99)
Glucose-Capillary: 272 mg/dL — ABNORMAL HIGH (ref 70–99)
Glucose-Capillary: 286 mg/dL — ABNORMAL HIGH (ref 70–99)
Glucose-Capillary: 134 mg/dL — ABNORMAL HIGH (ref 70–99)

## 2012-05-25 LAB — CBC
HCT: 44.1 % (ref 36.0–46.0)
Hemoglobin: 15 g/dL (ref 12.0–15.0)
MCH: 28.8 pg (ref 26.0–34.0)
MCHC: 34 g/dL (ref 30.0–36.0)
MCV: 84.6 fL (ref 78.0–100.0)
Platelets: 279 10*3/uL (ref 150–400)
RBC: 5.21 MIL/uL — ABNORMAL HIGH (ref 3.87–5.11)
RDW: 13.3 % (ref 11.5–15.5)
WBC: 14.1 10*3/uL — ABNORMAL HIGH (ref 4.0–10.5)

## 2012-05-25 LAB — LIPID PANEL
Cholesterol: 167 mg/dL (ref 0–200)
HDL: 61 mg/dL (ref >39)
LDL Cholesterol: 86 mg/dL (ref 0–99)
Total CHOL/HDL Ratio: 2.7 RATIO
Triglycerides: 101 mg/dL (ref <150)
VLDL: 20 mg/dL (ref 0–40)

## 2012-05-25 LAB — BASIC METABOLIC PANEL
BUN: 18 mg/dL (ref 6–23)
CO2: 34 mEq/L — ABNORMAL HIGH (ref 19–32)
Calcium: 9.9 mg/dL (ref 8.4–10.5)
Chloride: 97 mEq/L (ref 96–112)
Creatinine, Ser: 0.62 mg/dL (ref 0.50–1.10)
GFR calc Af Amer: >90 mL/min (ref >90)
GFR calc non Af Amer: >90 mL/min (ref >90)
Glucose, Bld: 210 mg/dL — ABNORMAL HIGH (ref 70–99)
Potassium: 3.3 mEq/L — ABNORMAL LOW (ref 3.5–5.1)
Sodium: 139 mEq/L (ref 135–145)

## 2012-05-25 LAB — MAGNESIUM: Magnesium: 2.1 mg/dL (ref 1.5–2.5)

## 2012-05-25 MED ORDER — INSULIN ASPART 100 UNIT/ML ~~LOC~~ SOLN
10.0000 [IU] | Freq: Three times a day (TID) | SUBCUTANEOUS | Status: DC
  Administered 2012-05-25 – 2012-05-27 (×3): 10 [IU] via SUBCUTANEOUS

## 2012-05-25 MED ORDER — METOPROLOL SUCCINATE ER 25 MG PO TB24
25.0000 mg | ORAL_TABLET | Freq: Every day | ORAL | Status: DC
  Filled 2012-05-25: qty 1

## 2012-05-25 MED ORDER — IRBESARTAN 75 MG PO TABS
75.0000 mg | ORAL_TABLET | Freq: Every day | ORAL | Status: DC
  Administered 2012-05-25: 75 mg via ORAL
  Filled 2012-05-25 (×2): qty 1

## 2012-05-25 MED ORDER — IOHEXOL 300 MG/ML  SOLN
80.0000 mL | Freq: Once | INTRAMUSCULAR | Status: AC | PRN
  Administered 2012-05-25: 80 mL via INTRAVENOUS

## 2012-05-25 MED ORDER — ASPIRIN 81 MG PO CHEW
324.0000 mg | CHEWABLE_TABLET | ORAL | Status: AC
  Administered 2012-05-26: 324 mg via ORAL

## 2012-05-25 MED ORDER — SODIUM CHLORIDE 0.9 % IJ SOLN
3.0000 mL | Freq: Two times a day (BID) | INTRAMUSCULAR | Status: DC
  Administered 2012-05-25 – 2012-05-26 (×2): 3 mL via INTRAVENOUS

## 2012-05-25 MED ORDER — SODIUM CHLORIDE 0.9 % IJ SOLN: 3.0000 mL | INTRAMUSCULAR | Status: DC | PRN

## 2012-05-25 MED ORDER — INSULIN DETEMIR 100 UNIT/ML ~~LOC~~ SOLN
15.0000 [IU] | Freq: Two times a day (BID) | SUBCUTANEOUS | Status: DC
  Administered 2012-05-25: 15 [IU] via SUBCUTANEOUS
  Filled 2012-05-25 (×2): qty 10

## 2012-05-25 MED ORDER — SODIUM CHLORIDE 0.9 % IV SOLN
250.0000 mL | INTRAVENOUS | Status: DC | PRN
  Administered 2012-05-26: 250 mL via INTRAVENOUS

## 2012-05-25 MED ORDER — CARVEDILOL 6.25 MG PO TABS
6.2500 mg | ORAL_TABLET | Freq: Two times a day (BID) | ORAL | Status: DC
  Administered 2012-05-25 – 2012-05-27 (×4): 6.25 mg via ORAL
  Filled 2012-05-25 (×6): qty 1

## 2012-05-25 NOTE — Progress Notes (Signed)
[facility] PROGRESS NOTE  Linda Lowe XBJ:[id number] DOB: 15-Apr-1951 DOA: 05/23/2012 PCP: Geraldo Pitter, MD  Assessment/Plan: Acute CHF exacerbation  - -5435cc for the admission -Continue intravenous furosemide  -Restart ARB, ejection fraction 35-40%, patient on Diovan at home Pericardial effusion -Due to its size, consult cardiology for recommendations -Patient remains hemodynamically stable -No clinical or electrocardiographic signs of tamponade Hypertension  -Improving with diuresis -Convert patient to metoprolol succinate, suspect blood pressure will come down with continued diuresis  -Plan to switch to metoprolol succinate if no contraindications at discharge  Diabetes mellitus type 2  -Patient has very poor insight into her medications  -Unusual outpatient regimen  -streamline regimen, hemoglobin A1c 9.3  -LDL is not at goal, start simvastatin - increase Levemir to 15 mg twice a day, increase pre-meal NovoLog  -Hold oral hypoglycemics for now  Pulmonary infiltrates  -Doubt pneumonia  - Discontinue antibiotics and observe     Procedures/Studies: Dg Chest 2 View  05/24/2012  *RADIOLOGY REPORT*  Clinical Data: CHF versus pneumonia.  Follow-up.  CHEST - 2 VIEW  Comparison: Chest radiograph 05/23/2012 and 11/26/2006  Findings: Stable moderate enlargement of the cardiopericardial silhouette.  Prominent right hilum appears similar to priors, likely reflecting prominent central pulmonary vascularity.  There is mild diffuse pulmonary vascular congestion, with some improvement since 05/23/2012.  Interstitial prominence and airspace opacities have significantly decreased since 05/23/2012. Streaky atelectasis left lung base.  No pleural effusion evident.  IMPRESSION: Significant improved aeration of both lungs.  Given the rapid improvement in aeration, pulmonary edema is favored over pneumonia.   Original Report Authenticated By: Britta Mccreedy, M.D.    Dg Chest 2 View  05/23/2012   *RADIOLOGY REPORT*  Clinical Data: Nausea, emesis and cough.  CHEST - 2 VIEW  Comparison: Chest x-ray 11/26/2006.  Findings: Lung volumes are normal.  There is cephalization of the pulmonary vasculature, increased interstitial markings and patchy airspace disease scattered throughout the lungs bilaterally, most suspicious for moderate pulmonary edema (strictly speaking, multilobar pneumonia could have a similar appearance).  No definite pleural effusions.  Moderate cardiomegaly.  Mediastinal contours are otherwise unremarkable.  IMPRESSION: 1.  Findings, as above, most concerning for congestive heart failure.  Strictly speaking, the multifocal interstitial and airspace disease could also relate to multilobar pneumonia. Clinical correlation is recommended.   Original Report Authenticated By: Florencia Reasons, M.D.       Code Status: Full Family Communication: husband at bedside  Disposition Plan: Home when medically stable  Subjective:  patient is breathing better. She is still having some dyspnea on exertion. Denies any chest pain, nausea, vomiting, dizziness, headache, syncope.   Objective: Filed Vitals:   05/24/12 2135 05/25/12 0503 05/25/12 0620 05/25/12 0922  BP: 138/63 120/55  134/72  Pulse: 68 59 63 74  Temp: 99.1 F (37.3 C) 98.1 F (36.7 C)    TempSrc: Oral Oral    Resp: 20 21    Height:      Weight:  82.827 kg (182 lb 9.6 oz)    SpO2: 97% 100%      Intake/Output Summary (Last 24 hours) at 05/25/12 1245 Last data filed at 05/25/12 0834  Gross per 24 hour  Intake    480 ml  Output   3150 ml  Net  -2670 ml   Weight change: -1.814 kg (-4 lb) Exam:   General:  Pt is alert, follows commands appropriately, not in acute distress  HEENT: No icterus, No thrush,  [place]/AT  Cardiovascular: Regular rate and rhythm,  no rubs, noS3  Respiratory: bibasilar crackles, no wheezes or rhonchi   Abdomen: Soft, non tender, non distended, bowel sounds present, no  guarding  Extremities: Trace pedal  edema, No lymphangitis, No petechiae, No rashes, no synovitis  Data Reviewed: Basic Metabolic Panel:  Lab 05/25/12 1308 05/24/12 0515 05/23/12 1701 05/23/12 0940  NA 139 139 -- 138  K 3.3* 3.4* -- 3.0*  CL 97 100 -- 101  CO2 34* 27 -- 25  GLUCOSE 210* 222* -- 271*  BUN 18 17 -- 12  CREATININE 0.62 0.56 0.76 0.61  CALCIUM 9.9 9.2 -- 9.1  MG 2.1 -- -- --  PHOS -- -- -- --   Liver Function Tests:  Lab 05/23/12 0940  AST 22  ALT 22  ALKPHOS 127*  BILITOT 0.4  PROT 7.6  ALBUMIN 3.8    Lab 05/23/12 0940  LIPASE 20  AMYLASE --   No results found for this basename: AMMONIA:5 in the last 168 hours CBC:  Lab 05/25/12 0622 05/24/12 0515 05/23/12 1701 05/23/12 0940  WBC 14.1* 10.6* 10.3 9.3  NEUTROABS -- -- -- --  HGB 15.0 13.3 13.3 14.3  HCT 44.1 40.4 39.7 41.5  MCV 84.6 85.2 84.3 83.3  PLT 279 263 255 248   Cardiac Enzymes:  Lab 05/23/12 2043 05/23/12 1656 05/23/12 0942  CKTOTAL 106 119 --  CKMB 3.6 3.6 --  CKMBINDEX -- -- --  TROPONINI -- -- <0.30   BNP: No components found with this basename: POCBNP:5 CBG:  Lab 05/25/12 1113 05/25/12 0607 05/24/12 2138 05/24/12 1617 05/24/12 1050  GLUCAP 272* 195* 149* 212* 255*    No results found for this or any previous visit (from the past 240 hour(s)).   Scheduled Meds:   . aspirin  81 mg Oral Daily  . azithromycin (ZITHROMAX) 500 MG IVPB  500 mg Intravenous Q24H  . cefTRIAXone (ROCEPHIN)  IV  1 g Intravenous Q24H  . enoxaparin (LOVENOX) injection  40 mg Subcutaneous Q24H  . furosemide  40 mg Intravenous Q12H  . insulin aspart  0-10 Units Subcutaneous QHS  . insulin aspart  0-15 Units Subcutaneous TID WC  . insulin aspart  10 Units Subcutaneous TID WC  . insulin detemir  15 Units Subcutaneous BID  . metoprolol tartrate  12.5 mg Oral BID  . montelukast  10 mg Oral QHS  . oxybutynin  5 mg Oral Daily  . pneumococcal 23 valent vaccine  0.5 mL Intramuscular Tomorrow-1000  .  potassium chloride SA  40 mEq Oral Daily  . simvastatin  40 mg Oral q1800  . DISCONTD: insulin aspart  8 Units Subcutaneous TID WC  . DISCONTD: insulin detemir  10 Units Subcutaneous BID   Continuous Infusions:    [name], DO  [facility] Pager [phone]  If 7PM-7AM, please contact night-coverage [email or web address] Password [email or web address] 05/25/2012, 12:45 PM   LOS: 2 days

## 2012-05-25 NOTE — Consult Note (Signed)
CARDIOLOGY CONSULT NOTE  Patient ID: Linda Lowe MRN: [id number], DOB/AGE: August 21, 1951   Admit date: 05/23/2012 Date of Consult: 05/25/2012   Primary Physician: Geraldo Pitter, MD Primary Cardiologist: new - seen by B. Jens Som, MD  Pt. Profile  61 y/o Asian female w/o prior cardiac hx who presented for dyspnea and has been found to have newly dx cardiomyopathy and pericardial effusion.  Problem List  Past Medical History  Diagnosis Date  . Diabetes mellitus     a. Dx > 5 y ago  . Hypertension   . Hypercholesteremia   . Arthritis   . Cardiomyopathy     a. 05/2012 Echo: EF 30-35%, Gr 1 DD, mild LVH, mild MR, pericardial effusion  . Pericardial effusion     a. 05/2012 Echo: circumferential pericardial effusion, mostly 10mm, largest pocket posterior - 23mm, mild RA indentation, no RV collapse, no tamponade.    Past Surgical History  Procedure Date  . Abdominal hysterectomy   . Foot surgery     Allergies  No Known Allergies  HPI   61 y/o female with the above problem list.  She has no prior cardiac hx.  She was in her usoh until approx 2 wks ago when she began to experience DOE w/o pnd, orthopnea, n, v, dizziness, syncope, edema, early satiety, of chest pain.  DOE occurred with less and less activity and this past Friday, 9/6, she became very dyspneic while lying in bed @ night, assoc w/ productive cough and mild chest and throat tightness.  This persisted into Saturday morning and she presented to the ED, where CXR showed increased interstitial markings and patchy ASD concerning for pulmonary edema, though multilobar pna could not be ruled out. She was admitted and placed on abx along with IV lasix.  Echo was performed yesterday and showed reduced LV fxn (30-35%) and mild to mod pericardial effusion.  There also appeared to be impingement on the RA.  With diuresis, pt has had symptomatic improvement, with a net negative I/O of 5 Liters and 4 lb weight loss.  She currently is Ss  free.  Inpatient Medications     . aspirin  81 mg Oral Daily  . azithromycin (ZITHROMAX) 500 MG IVPB  500 mg Intravenous Q24H  . carvedilol  6.25 mg Oral BID WC  . cefTRIAXone (ROCEPHIN)  IV  1 g Intravenous Q24H  . enoxaparin (LOVENOX) injection  40 mg Subcutaneous Q24H  . furosemide  40 mg Intravenous Q12H  . insulin aspart  0-10 Units Subcutaneous QHS  . insulin aspart  0-15 Units Subcutaneous TID WC  . insulin aspart  10 Units Subcutaneous TID WC  . insulin detemir  15 Units Subcutaneous BID  . irbesartan  75 mg Oral Daily  . montelukast  10 mg Oral QHS  . oxybutynin  5 mg Oral Daily  . pneumococcal 23 valent vaccine  0.5 mL Intramuscular Tomorrow-1000  . potassium chloride SA  40 mEq Oral Daily  . simvastatin  40 mg Oral q1800  . DISCONTD: insulin aspart  8 Units Subcutaneous TID WC  . DISCONTD: insulin detemir  10 Units Subcutaneous BID  . DISCONTD: metoprolol succinate  25 mg Oral QHS  . DISCONTD: metoprolol tartrate  12.5 mg Oral BID    Family History Family History  Problem Relation Age of Onset  . Other      No history of CAD  . Tuberculosis Father     died when pt was young.     Social  History History   Social History  . Marital Status: Married    Spouse Name: N/A    Number of Children: N/A  . Years of Education: N/A   Occupational History  . Not on file.   Social History Main Topics  . Smoking status: Former Games developer  . Smokeless tobacco: Not on file   Comment: Quit "long time ago."  . Alcohol Use: No  . Drug Use: No  . Sexually Active: Not on file   Other Topics Concern  . Not on file   Social History Narrative   Originally from the [place].  She lives in [place] with her husband.      Review of Systems  General:  No chills, fever, night sweats or weight changes.  Cardiovascular:  +++ chest tightness in the setting of coughing and dyspnea on Friday 9/6.  She has had DOE over the past 2 wks w/o edema, orthopnea, palpitations, paroxysmal  nocturnal dyspnea. Dermatological: No rash, lesions/masses Respiratory: +++ cough, dyspnea Urologic: No hematuria, dysuria Abdominal:   No nausea, vomiting, diarrhea, bright red blood per rectum, melena, or hematemesis Neurologic:  No visual changes, wkns, changes in mental status. All other systems reviewed and are otherwise negative except as noted above.  Physical Exam  Blood pressure 134/72, pulse 74, temperature 98.1 F (36.7 C), temperature source Oral, resp. rate 21, height 5\' 2"  (1.575 m), weight 182 lb 9.6 oz (82.827 kg), SpO2 100.00%.  General: Pleasant, NAD Psych: Normal affect. Neuro: Alert and oriented X 3. Moves all extremities spontaneously. HEENT: Normal  Neck: Supple without bruits or JVD. Lungs:  Resp regular and unlabored, bibasilar crackles. Heart: RRR no s3, s4, or murmurs. Abdomen: Soft, non-tender, non-distended, BS + x 4.  Extremities: No clubbing, cyanosis or edema. DP/PT/Radials 2+ and equal bilaterally.  Labs   [facility] 05/23/12 2043 05/23/12 1656 05/23/12 0942  CKTOTAL 106 119 --  CKMB 3.6 3.6 --  TROPONINI -- -- <0.30   Lab Results  Component Value Date   WBC 14.1* 05/25/2012   HGB 15.0 05/25/2012   HCT 44.1 05/25/2012   MCV 84.6 05/25/2012   PLT 279 05/25/2012     Lab 05/25/12 0622 05/23/12 0940  NA 139 --  K 3.3* --  CL 97 --  CO2 34* --  BUN 18 --  CREATININE 0.62 --  CALCIUM 9.9 --  PROT -- 7.6  BILITOT -- 0.4  ALKPHOS -- 127*  ALT -- 22  AST -- 22  GLUCOSE 210* --   Lab Results  Component Value Date   CHOL 167 05/25/2012   HDL 61 05/25/2012   LDLCALC 86 05/25/2012   TRIG 101 05/25/2012   No results found for this basename: DDIMER    Radiology/Studies  Dg Chest 2 View  05/24/2012  *RADIOLOGY REPORT*  Clinical Data: CHF versus pneumonia.  Follow-up.  CHEST - 2 VIEW  Comparison: Chest radiograph 05/23/2012 and 11/26/2006  Findings: Stable moderate enlargement of the cardiopericardial silhouette.  Prominent right hilum appears similar to  priors, likely reflecting prominent central pulmonary vascularity.  There is mild diffuse pulmonary vascular congestion, with some improvement since 05/23/2012.  Interstitial prominence and airspace opacities have significantly decreased since 05/23/2012. Streaky atelectasis left lung base.  No pleural effusion evident.  IMPRESSION: Significant improved aeration of both lungs.  Given the rapid improvement in aeration, pulmonary edema is favored over pneumonia.   Original Report Authenticated By: Britta Mccreedy, M.D.    Dg Chest 2 View  05/23/2012  *RADIOLOGY REPORT*  Clinical Data: Nausea, emesis and cough.  CHEST - 2 VIEW  Comparison: Chest x-ray 11/26/2006.  Findings: Lung volumes are normal.  There is cephalization of the pulmonary vasculature, increased interstitial markings and patchy airspace disease scattered throughout the lungs bilaterally, most suspicious for moderate pulmonary edema (strictly speaking, multilobar pneumonia could have a similar appearance).  No definite pleural effusions.  Moderate cardiomegaly.  Mediastinal contours are otherwise unremarkable.  IMPRESSION: 1.  Findings, as above, most concerning for congestive heart failure.  Strictly speaking, the multifocal interstitial and airspace disease could also relate to multilobar pneumonia. Clinical correlation is recommended.   Original Report Authenticated By: Florencia Reasons, M.D.    2D Echocardiogram 05/24/2012 Study Conclusions  - Left ventricle: The cavity size was normal. Wall thickness   was increased in a pattern of mild LVH. Systolic function   was moderately to severely reduced. The estimated ejection   fraction was in the range of 30% to 35%. Doppler   parameters are consistent with abnormal left ventricular   relaxation (grade 1 diastolic dysfunction). - Mitral valve: Mild regurgitation. - Pericardium, extracardiac: Pericardial effusion surrounds   heart. Mostly measures 10 mm . Largest pocket is posterior   to RA at  23 mm. There is mild RA indentation. No RV   collapse. Mtiral inflow is without signif variation. IVC   decreases with inspiration Overall does not meet criteria   for tamponade by echo criteria Clinical correlation   indicated. _____________   ECG  pending  ASSESSMENT AND PLAN  See Attending Note.   Signed, Nicolasa Ducking, NP 05/25/2012, 2:01 PM   As above, patient seen and examined. Briefly 61 year old female with diabetes, hypertension and hyperlipidemia for evaluation of congestive heart failure and pericardial effusion. Patient admitted on September 7 with complaints of 2 weeks of dyspnea on exertion. On September 6 she noticed significant coughing with lying flat productive of whitish sputum. This improved with sitting up. She had some chest tightness with her cough but otherwise no chest pain. She denies pedal edema, fevers, chills or other URI symptoms. Initial blood work showed a BNP of 1854. White count was 9.3. Chest x-ray showed CHF. She has been treated with diuretics and antibiotics with improvement in her symptoms. Echocardiogram shows an ejection fraction of 30-35%, mild mitral regurgitation and a small to moderate pericardial effusion. Upon my review there is a question of a mass near the right atrium. Note patient also has a father with a history of tuberculosis. Presentation most consistent with congestive heart failure. Would discontinue antibiotics. Add lisinopril 5 mg daily. Discontinue metoprolol. Add carvedilol 3.125 mg by mouth twice a day. Titrate medications as tolerated by pulse and blood pressure. Continue Lasix at 40 mg twice a day. Follow potassium and renal function. Patient will need right and left cardiac catheterization to exclude CAD as cause of cardiomyopathy. The risks and benefits were discussed and she agrees to proceed. Continue aspirin and statin. Check TSH. I will plan a chest CT without contrast for question mass close to right atrium. Note the  patient also with pericardial effusion. Given family history of tuberculosis this should be a consideration. 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 We will ask ID to evaluate for further workup. Plan followup echocardiogram in approximately 2 weeks. Olga Millers 2:01 PM

## 2012-05-26 ENCOUNTER — Encounter (HOSPITAL_COMMUNITY)
Admission: EM | Disposition: A | Discharge status: Home / Self Care | Payer: Self-pay | Source: Home / Self Care | Attending: Internal Medicine

## 2012-05-26 DIAGNOSIS — I509 Heart failure, unspecified: Secondary | ICD-10-CM

## 2012-05-26 DIAGNOSIS — J189 Pneumonia, unspecified organism: Secondary | ICD-10-CM

## 2012-05-26 HISTORY — PX: LEFT AND RIGHT HEART CATHETERIZATION WITH CORONARY ANGIOGRAM: SHX5449

## 2012-05-26 LAB — POCT I-STAT 3, ART BLOOD GAS (G3+)
Acid-Base Excess: 6 mmol/L — ABNORMAL HIGH (ref 0.0–2.0)
Bicarbonate: 31.5 mEq/L — ABNORMAL HIGH (ref 20.0–24.0)
O2 Saturation: 95 %
TCO2: 33 mmol/L (ref 0–100)
pCO2 arterial: 49 mmHg — ABNORMAL HIGH (ref 35.0–45.0)
pH, Arterial: 7.415 (ref 7.350–7.450)
pO2, Arterial: 78 mmHg — ABNORMAL LOW (ref 80.0–100.0)

## 2012-05-26 LAB — BASIC METABOLIC PANEL
BUN: 20 mg/dL (ref 6–23)
CO2: 30 mEq/L (ref 19–32)
Calcium: 9.6 mg/dL (ref 8.4–10.5)
Chloride: 98 mEq/L (ref 96–112)
Creatinine, Ser: 0.62 mg/dL (ref 0.50–1.10)
GFR calc Af Amer: >90 mL/min (ref >90)
GFR calc non Af Amer: >90 mL/min (ref >90)
Glucose, Bld: 230 mg/dL — ABNORMAL HIGH (ref 70–99)
Potassium: 3.4 mEq/L — ABNORMAL LOW (ref 3.5–5.1)
Sodium: 139 mEq/L (ref 135–145)

## 2012-05-26 LAB — CBC
HCT: 44.2 % (ref 36.0–46.0)
Hemoglobin: 15.1 g/dL — ABNORMAL HIGH (ref 12.0–15.0)
MCH: 28.8 pg (ref 26.0–34.0)
MCHC: 34.2 g/dL (ref 30.0–36.0)
MCV: 84.2 fL (ref 78.0–100.0)
Platelets: 248 10*3/uL (ref 150–400)
RBC: 5.25 MIL/uL — ABNORMAL HIGH (ref 3.87–5.11)
RDW: 13.3 % (ref 11.5–15.5)
WBC: 10.6 10*3/uL — ABNORMAL HIGH (ref 4.0–10.5)

## 2012-05-26 LAB — POCT I-STAT 3, VENOUS BLOOD GAS (G3P V)
Acid-Base Excess: 7 mmol/L — ABNORMAL HIGH (ref 0.0–2.0)
Bicarbonate: 33.6 mEq/L — ABNORMAL HIGH (ref 20.0–24.0)
O2 Saturation: 70 %
TCO2: 35 mmol/L (ref 0–100)
pCO2, Ven: 52.6 mmHg — ABNORMAL HIGH (ref 45.0–50.0)
pH, Ven: 7.414 — ABNORMAL HIGH (ref 7.250–7.300)
pO2, Ven: 37 mmHg (ref 30.0–45.0)

## 2012-05-26 LAB — GLUCOSE, CAPILLARY
Glucose-Capillary: 227 mg/dL — ABNORMAL HIGH (ref 70–99)
Glucose-Capillary: 207 mg/dL — ABNORMAL HIGH (ref 70–99)
Glucose-Capillary: 227 mg/dL — ABNORMAL HIGH (ref 70–99)
Glucose-Capillary: 391 mg/dL — ABNORMAL HIGH (ref 70–99)

## 2012-05-26 LAB — PROTIME-INR
INR: 1.05 (ref 0.00–1.49)
Prothrombin Time: 13.9 seconds (ref 11.6–15.2)

## 2012-05-26 SURGERY — LEFT AND RIGHT HEART CATHETERIZATION WITH CORONARY ANGIOGRAM

## 2012-05-26 MED ORDER — IRBESARTAN 150 MG PO TABS
150.0000 mg | ORAL_TABLET | Freq: Every day | ORAL | Status: DC
  Administered 2012-05-26 – 2012-05-27 (×2): 150 mg via ORAL
  Filled 2012-05-26 (×2): qty 1

## 2012-05-26 MED ORDER — SODIUM CHLORIDE 0.9 % IV SOLN: 1.0000 mL/kg/h | INTRAVENOUS | Status: DC

## 2012-05-26 MED ORDER — NITROGLYCERIN 0.2 MG/ML ON CALL CATH LAB
INTRAVENOUS | Status: AC
  Filled 2012-05-26: qty 1

## 2012-05-26 MED ORDER — HEPARIN (PORCINE) IN NACL 2-0.9 UNIT/ML-% IJ SOLN
INTRAMUSCULAR | Status: AC
  Filled 2012-05-26: qty 2000

## 2012-05-26 MED ORDER — FENTANYL CITRATE 0.05 MG/ML IJ SOLN
INTRAMUSCULAR | Status: AC
  Filled 2012-05-26: qty 2

## 2012-05-26 MED ORDER — SODIUM CHLORIDE 0.9 % IJ SOLN: 3.0000 mL | INTRAMUSCULAR | Status: DC | PRN

## 2012-05-26 MED ORDER — SODIUM CHLORIDE 0.9 % IJ SOLN: 3.0000 mL | Freq: Two times a day (BID) | INTRAMUSCULAR | Status: DC

## 2012-05-26 MED ORDER — LIDOCAINE HCL (PF) 1 % IJ SOLN
INTRAMUSCULAR | Status: AC
  Filled 2012-05-26: qty 30

## 2012-05-26 MED ORDER — SODIUM CHLORIDE 0.9 % IV SOLN: 250.0000 mL | INTRAVENOUS | Status: DC

## 2012-05-26 MED ORDER — ACETAMINOPHEN 325 MG PO TABS: 650.0000 mg | ORAL_TABLET | ORAL | Status: DC | PRN

## 2012-05-26 MED ORDER — SODIUM CHLORIDE 0.9 % IV SOLN
INTRAVENOUS | Status: AC
  Administered 2012-05-26: 18:00:00 via INTRAVENOUS

## 2012-05-26 MED ORDER — INSULIN DETEMIR 100 UNIT/ML ~~LOC~~ SOLN
20.0000 [IU] | Freq: Two times a day (BID) | SUBCUTANEOUS | Status: DC
  Administered 2012-05-26 – 2012-05-27 (×2): 20 [IU] via SUBCUTANEOUS
  Filled 2012-05-26: qty 10

## 2012-05-26 MED ORDER — MOXIFLOXACIN HCL 400 MG PO TABS
400.0000 mg | ORAL_TABLET | Freq: Every day | ORAL | Status: DC
  Administered 2012-05-26: 400 mg via ORAL
  Filled 2012-05-26 (×2): qty 1

## 2012-05-26 MED ORDER — MIDAZOLAM HCL 2 MG/2ML IJ SOLN
INTRAMUSCULAR | Status: AC
  Filled 2012-05-26: qty 2

## 2012-05-26 MED ORDER — ONDANSETRON HCL 4 MG/2ML IJ SOLN: 4.0000 mg | Freq: Four times a day (QID) | INTRAMUSCULAR | Status: DC | PRN

## 2012-05-26 MED ORDER — POTASSIUM CHLORIDE CRYS ER 20 MEQ PO TBCR
40.0000 meq | EXTENDED_RELEASE_TABLET | Freq: Once | ORAL | Status: AC
  Administered 2012-05-26: 40 meq via ORAL

## 2012-05-26 NOTE — Progress Notes (Signed)
Utilization Review Completed.  

## 2012-05-26 NOTE — H&P (View-Only) (Signed)
@   Subjective:  Denies CP or dyspnea   Objective:  Filed Vitals:   05/25/12 1554 05/25/12 2034 05/26/12 0447 05/26/12 0525  BP:  122/71 155/76 144/82  Pulse: 67 72 74 66  Temp:  99.5 F (37.5 C) 98.4 F (36.9 C)   TempSrc:  Oral Oral   Resp:  20 20   Height:      Weight:   181 lb 10.5 oz (82.4 kg)   SpO2:  97% 95%     Intake/Output from previous day:  Intake/Output Summary (Last 24 hours) at 05/26/12 0650 Last data filed at 05/26/12 0644  Gross per 24 hour  Intake    883 ml  Output   3075 ml  Net  -2192 ml    Physical Exam: Physical exam: Well-developed well-nourished in no acute distress.  Skin is warm and dry.  HEENT is normal.  Neck is supple.  Chest with mildly diminished BS bases bilaterally  Cardiovascular exam is regular rate and rhythm.  Abdominal exam nontender or distended. No masses palpated. Extremities show no edema. neuro grossly intact    Lab Results: Basic Metabolic Panel:  Basename 05/25/12 0622 05/24/12 0515  NA 139 139  K 3.3* 3.4*  CL 97 100  CO2 34* 27  GLUCOSE 210* 222*  BUN 18 17  CREATININE 0.62 0.56  CALCIUM 9.9 9.2  MG 2.1 --  PHOS -- --   CBC:  Basename 05/26/12 0615 05/25/12 0622  WBC 10.6* 14.1*  NEUTROABS -- --  HGB 15.1* 15.0  HCT 44.2 44.1  MCV 84.2 84.6  PLT 248 279   Cardiac Enzymes:  Basename 05/23/12 2043 05/23/12 1656 05/23/12 0942  CKTOTAL 106 119 --  CKMB 3.6 3.6 --  CKMBINDEX -- -- --  TROPONINI -- -- <0.30     Assessment/Plan:  1) Cardiomyopathy - Etiology unclear; for Right and left cath today; Await TSH; continue coreg; increase avapro to 150 mg po BID. 2) Acute systolic CHF - symptoms improving; hold lasix today for cath and resume in AM; follow renal function. 3) Pericardial effusion - small to moderate; repeat echo in 2 weeks. 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 Contribution to effusion; would ask ID to see to further w/u. 5) Abnormal chest CT - will need FU chest CT. Olga Millers 05/26/2012, 6:50 AM

## 2012-05-26 NOTE — CV Procedure (Signed)
   Cardiac Catheterization Procedure Note  Name: RHANDA LEMIRE MRN: [id number] DOB: 19-Apr-1951  Procedure: Right Heart Cath, Left Heart Cath, Selective Coronary Angiography, LV angiography  Indication: CHF, new-onset with severe LV dysfunction   Procedural Details: The right groin was prepped, draped, and anesthetized with 1% lidocaine. Using the modified Seldinger technique a 5 French sheath was placed in the right femoral artery and a 6 French sheath was placed in the right femoral vein. A multipurpose catheter was used for the right heart catheterization. Standard protocol was followed for recording of right heart pressures and sampling of oxygen saturations. Fick cardiac output was calculated. Standard Judkins catheters were used for selective coronary angiography and left ventriculography. There were no immediate procedural complications. The patient was transferred to the post catheterization recovery area for further monitoring.  Procedural Findings: Hemodynamics RA 4 RV 30/6 PA 34/17 mean 24 PCWP 7 LV 144/8 AO 145/76 mean 104  Oxygen saturations: PA 70 AO 96  Cardiac Output (Fick) 4.6  Cardiac Index (Fick) 2.5   Coronary angiography: Coronary dominance: right  Left mainstem: Angiographically normal, divides into the LAD and left circumflex the  Left anterior descending (LAD): Widely patent throughout. This is a large caliber vessel that wraps around the left ventricular apex. It supplies 2 diagonal branches without significant stenoses.  Left circumflex (LCx): Patent without significant disease. Supplies a moderate to large caliber obtuse marginal branch in the second OM distribution. The first OM is tiny.  Right coronary artery (RCA): Large, dominant vessel. No significant obstructive disease is noted. The PDA and PLA branches are widely patent.  Left ventriculography: Severe global hypokinesis of the left ventricle, the left ventricular ejection fraction is estimated at  30-35%.  Final Conclusions:   1. No significant coronary artery disease 2. Severe left ventricular dysfunction 3. Normal intracardiac hemodynamics  Recommendations: Medical therapy for nonischemic cardiomyopathy.  Tonny Bollman 05/26/2012, 5:08 PM

## 2012-05-26 NOTE — Progress Notes (Signed)
@   Subjective:  Denies CP or dyspnea   Objective:  Filed Vitals:   05/25/12 1554 05/25/12 2034 05/26/12 0447 05/26/12 0525  BP:  122/71 155/76 144/82  Pulse: 67 72 74 66  Temp:  99.5 F (37.5 C) 98.4 F (36.9 C)   TempSrc:  Oral Oral   Resp:  20 20   Height:      Weight:   181 lb 10.5 oz (82.4 kg)   SpO2:  97% 95%     Intake/Output from previous day:  Intake/Output Summary (Last 24 hours) at 05/26/12 0650 Last data filed at 05/26/12 0644  Gross per 24 hour  Intake    883 ml  Output   3075 ml  Net  -2192 ml    Physical Exam: Physical exam: Well-developed well-nourished in no acute distress.  Skin is warm and dry.  HEENT is normal.  Neck is supple.  Chest with mildly diminished BS bases bilaterally  Cardiovascular exam is regular rate and rhythm.  Abdominal exam nontender or distended. No masses palpated. Extremities show no edema. neuro grossly intact    Lab Results: Basic Metabolic Panel:  Basename 05/25/12 0622 05/24/12 0515  NA 139 139  K 3.3* 3.4*  CL 97 100  CO2 34* 27  GLUCOSE 210* 222*  BUN 18 17  CREATININE 0.62 0.56  CALCIUM 9.9 9.2  MG 2.1 --  PHOS -- --   CBC:  Basename 05/26/12 0615 05/25/12 0622  WBC 10.6* 14.1*  NEUTROABS -- --  HGB 15.1* 15.0  HCT 44.2 44.1  MCV 84.2 84.6  PLT 248 279   Cardiac Enzymes:  Basename 05/23/12 2043 05/23/12 1656 05/23/12 0942  CKTOTAL 106 119 --  CKMB 3.6 3.6 --  CKMBINDEX -- -- --  TROPONINI -- -- <0.30     Assessment/Plan:  1) Cardiomyopathy - Etiology unclear; for Right and left cath today; Await TSH; continue coreg; increase avapro to 150 mg po BID. 2) Acute systolic CHF - symptoms improving; hold lasix today for cath and resume in AM; follow renal function. 3) Pericardial effusion - small to moderate; repeat echo in 2 weeks. 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 Contribution to effusion; would ask ID to see to further w/u. 5) Abnormal chest CT - will need FU chest CT. Brian  Crenshaw 05/26/2012, 6:50 AM    

## 2012-05-26 NOTE — Progress Notes (Signed)
[facility] PROGRESS NOTE  Linda Lowe ZOX:[id number] DOB: [birth year] DOA: 05/23/2012 PCP: Geraldo Pitter, MD  Assessment/Plan: Acute CHF exacerbation  - -7267cc for the admission  -lasix stopped for hearth cath today (9/10) -Continue intravenous furosemide in am if ok with cardiology -continue ARB, ejection fraction 35-40%, patient on Diovan at home  -Cardiac catheterization today revealed  nonobstructive CAD Pericardial effusion  -Tuberculosis very low suspicion-patient has no fevers, no weight loss, no other "B"symptoms -Patient gives a history of a negative PPD 26 years ago when she came to [place] (from [place]) and a negative PPD again last year -Can consider Quanti-FERON-TB Gold if suspicion remained high do to  interobserver variability with PPD interpretation -Patient remains hemodynamically stable  -No clinical or electrocardiographic signs of tamponade  Pulmonary infiltrates with mediastinal lymphadenopathy -Will treat with antibiotics empirically at this time for CAP -After treatment, plan to repeat CT in approximately 6 weeks to ensure clearance of the infiltrates and mediastinal lymphadenopathy Hypertension  -Improving with diuresis  -Continue carvedilol, suspect blood pressure will come down with continued diuresis  -May need to increase dosing of antihypertensives Diabetes mellitus type 2  -Unusual outpatient regimen  -streamline regimen, hemoglobin A1c 9.3  -LDL is not at goal, start simvastatin  - increase Levemir to 20 mg twice a day, increase pre-meal NovoLog  -Hold oral hypoglycemics for now    Procedures/Studies: Dg Chest 2 View  05/24/2012  *RADIOLOGY REPORT*  Clinical Data: CHF versus pneumonia.  Follow-up.  CHEST - 2 VIEW  Comparison: Chest radiograph 05/23/2012 and 11/26/2006  Findings: Stable moderate enlargement of the cardiopericardial silhouette.  Prominent right hilum appears similar to priors, likely reflecting prominent central  pulmonary vascularity.  There is mild diffuse pulmonary vascular congestion, with some improvement since 05/23/2012.  Interstitial prominence and airspace opacities have significantly decreased since 05/23/2012. Streaky atelectasis left lung base.  No pleural effusion evident.  IMPRESSION: Significant improved aeration of both lungs.  Given the rapid improvement in aeration, pulmonary edema is favored over pneumonia.   Original Report Authenticated By: Britta Mccreedy, M.D.    Dg Chest 2 View  05/23/2012  *RADIOLOGY REPORT*  Clinical Data: Nausea, emesis and cough.  CHEST - 2 VIEW  Comparison: Chest x-ray 11/26/2006.  Findings: Lung volumes are normal.  There is cephalization of the pulmonary vasculature, increased interstitial markings and patchy airspace disease scattered throughout the lungs bilaterally, most suspicious for moderate pulmonary edema (strictly speaking, multilobar pneumonia could have a similar appearance).  No definite pleural effusions.  Moderate cardiomegaly.  Mediastinal contours are otherwise unremarkable.  IMPRESSION: 1.  Findings, as above, most concerning for congestive heart failure.  Strictly speaking, the multifocal interstitial and airspace disease could also relate to multilobar pneumonia. Clinical correlation is recommended.   Original Report Authenticated By: Florencia Reasons, M.D.    Ct Chest W Contrast  05/25/2012  *RADIOLOGY REPORT*  Clinical Data: Shortness of breath.  Abnormal echocardiogram.  CT CHEST WITH CONTRAST  Technique:  Multidetector CT imaging of the chest was performed following the standard protocol during bolus administration of intravenous contrast.  Contrast: 80mL OMNIPAQUE IOHEXOL 300 MG/ML  SOLN  Comparison: Chest radiograph 05/24/2012.  Findings: Sub-centimeter thyroid nodule(s) noted, too small to characterize, but most likely benign in the absence of known clinical risk factors for thyroid carcinoma. Mediastinal lymph nodes measure up to 10 mm in the low  right paratracheal station. Right hilar lymph node measures 8 mm.  No left hilar or axillary adenopathy.  Pulmonary arteries and  heart are enlarged.  Small pericardial effusion.  There is a small area of irregular airspace consolidation in the subpleural left lower lobe (image 41).  Prominent subpleural density is seen in the medial aspect of the superior segment right lower lobe, in the paraspinal region (image 22).  Scattered scarring and linear volume loss at the lung bases.  No pleural fluid.  Airway is unremarkable.  Incidental imaging of the upper abdomen shows no acute findings. No worrisome lytic or sclerotic lesions.  IMPRESSION:  1.  Small pericardial effusion. 2.  Irregular subpleural airspace consolidation in the left lower lobe.  Follow-up to clearing is recommended. 3.  Paraspinal right lower lobe subpleural soft tissue density may be due to scarring.  This can be reevaluated on follow-up imaging as well. 4.  Pulmonary arterial hypertension.   Original Report Authenticated By: Reyes Ivan, M.D.    Procedures:  Cardiac catheterization September 10  Antibiotics: Avelox September 10>>>  Code Status: Full Family Communication: Pt at bedside Disposition Plan: Home when medically stable  Subjective: Patient is feeling well. She denies any fevers, chills, chest pain, shortness of breath, dizziness, rash, vomiting, diarrhea.  Objective: Filed Vitals:   05/26/12 0826 05/26/12 1349 05/26/12 1617 05/26/12 1813  BP:  135/80  135/60  Pulse:  84 79 71  Temp:  98 F (36.7 C)  98.2 F (36.8 C)  TempSrc:  Oral  Oral  Resp:  20  18  Height:      Weight:      SpO2: 97% 97%      Intake/Output Summary (Last 24 hours) at 05/26/12 1845 Last data filed at 05/26/12 1300  Gross per 24 hour  Intake    223 ml  Output   1950 ml  Net  -1727 ml   Weight change: 0 kg (0 oz) Exam:   General:  Pt is alert, follows commands appropriately, not in acute distress  HEENT: No icterus, No  thrush, No neck mass, Dobson/AT  Cardiovascular: Regular rate and rhythm, S1/S2, no rubs, no gallops  Respiratory: Clear to auscultation bilaterally, no wheezing, no crackles, no rhonchi  Abdomen: Soft, non tender, non distended, bowel sounds present, no guarding  Extremities: Trace edema, No lymphangitis, No petechiae, No rashes, no synovitis  Data Reviewed: Basic Metabolic Panel:  Lab 05/26/12 4098 05/25/12 0622 05/24/12 0515 05/23/12 1701 05/23/12 0940  NA 139 139 139 -- 138  K 3.4* 3.3* 3.4* -- 3.0*  CL 98 97 100 -- 101  CO2 30 34* 27 -- 25  GLUCOSE 230* 210* 222* -- 271*  BUN 20 18 17  -- 12  CREATININE 0.62 0.62 0.56 0.76 0.61  CALCIUM 9.6 9.9 9.2 -- 9.1  MG -- 2.1 -- -- --  PHOS -- -- -- -- --   Liver Function Tests:  Lab 05/23/12 0940  AST 22  ALT 22  ALKPHOS 127*  BILITOT 0.4  PROT 7.6  ALBUMIN 3.8    Lab 05/23/12 0940  LIPASE 20  AMYLASE --   No results found for this basename: AMMONIA:5 in the last 168 hours CBC:  Lab 05/26/12 0615 05/25/12 0622 05/24/12 0515 05/23/12 1701 05/23/12 0940  WBC 10.6* 14.1* 10.6* 10.3 9.3  NEUTROABS -- -- -- -- --  HGB 15.1* 15.0 13.3 13.3 14.3  HCT 44.2 44.1 40.4 39.7 41.5  MCV 84.2 84.6 85.2 84.3 83.3  PLT 248 279 263 255 248   Cardiac Enzymes:  Lab 05/23/12 2043 05/23/12 1656 05/23/12 0942  CKTOTAL 106 119 --  CKMB 3.6 3.6 --  CKMBINDEX -- -- --  TROPONINI -- -- <0.30   BNP: No components found with this basename: POCBNP:5 CBG:  Lab 05/26/12 1713 05/26/12 1051 05/26/12 0610 05/25/12 2107 05/25/12 1615  GLUCAP 227* 207* 227* 134* 286*    No results found for this or any previous visit (from the past 240 hour(s)).   Scheduled Meds:   . aspirin  324 mg Oral Pre-Cath  . aspirin  81 mg Oral Daily  . carvedilol  6.25 mg Oral BID WC  . enoxaparin (LOVENOX) injection  40 mg Subcutaneous Q24H  . fentaNYL      . heparin      . insulin aspart  0-10 Units Subcutaneous QHS  . insulin aspart  0-15 Units  Subcutaneous TID WC  . insulin aspart  10 Units Subcutaneous TID WC  . insulin detemir  20 Units Subcutaneous BID  . irbesartan  150 mg Oral Daily  . lidocaine      . midazolam      . montelukast  10 mg Oral QHS  . moxifloxacin  400 mg Oral Q2000  . nitroGLYCERIN      . oxybutynin  5 mg Oral Daily  . potassium chloride SA  40 mEq Oral Daily  . potassium chloride  40 mEq Oral Once  . simvastatin  40 mg Oral q1800  . DISCONTD: furosemide  40 mg Intravenous Q12H  . DISCONTD: insulin detemir  15 Units Subcutaneous BID  . DISCONTD: irbesartan  75 mg Oral Daily  . DISCONTD: sodium chloride  3 mL Intravenous Q12H  . DISCONTD: sodium chloride  3 mL Intravenous Q12H   Continuous Infusions:   . sodium chloride 82 mL/hr at 05/26/12 1818  . DISCONTD: sodium chloride    . DISCONTD: sodium chloride       Shyheim Tanney, DO  [facility] Pager [phone]  If 7PM-7AM, please contact night-coverage [email or web address] Password [email or web address] 05/26/2012, 6:45 PM   LOS: 3 days

## 2012-05-26 NOTE — Interval H&P Note (Signed)
History and Physical Interval Note:  05/26/2012 4:37 PM  Linda Lowe  has presented today for surgery, with the diagnosis of cp  The various methods of treatment have been discussed with the patient and family. After consideration of risks, benefits and other options for treatment, the patient has consented to  Procedure(s) (LRB) with comments: LEFT AND RIGHT HEART CATHETERIZATION WITH CORONARY ANGIOGRAM () as a surgical intervention .  The patient's history has been reviewed, patient examined, no change in status, stable for surgery.  I have reviewed the patient's chart and labs.  Questions were answered to the patient's satisfaction.     Tonny Bollman

## 2012-05-27 LAB — BASIC METABOLIC PANEL
BUN: 20 mg/dL (ref 6–23)
CO2: 28 mEq/L (ref 19–32)
Calcium: 10.1 mg/dL (ref 8.4–10.5)
Chloride: 100 mEq/L (ref 96–112)
Creatinine, Ser: 0.62 mg/dL (ref 0.50–1.10)
GFR calc Af Amer: >90 mL/min (ref >90)
GFR calc non Af Amer: >90 mL/min (ref >90)
Glucose, Bld: 193 mg/dL — ABNORMAL HIGH (ref 70–99)
Potassium: 3.9 mEq/L (ref 3.5–5.1)
Sodium: 138 mEq/L (ref 135–145)

## 2012-05-27 LAB — GLUCOSE, CAPILLARY
Glucose-Capillary: 196 mg/dL — ABNORMAL HIGH (ref 70–99)
Glucose-Capillary: 199 mg/dL — ABNORMAL HIGH (ref 70–99)

## 2012-05-27 LAB — CBC
HCT: 45 % (ref 36.0–46.0)
Hemoglobin: 15.1 g/dL — ABNORMAL HIGH (ref 12.0–15.0)
MCH: 28.4 pg (ref 26.0–34.0)
MCHC: 33.6 g/dL (ref 30.0–36.0)
MCV: 84.7 fL (ref 78.0–100.0)
Platelets: 281 10*3/uL (ref 150–400)
RBC: 5.31 MIL/uL — ABNORMAL HIGH (ref 3.87–5.11)
RDW: 13.3 % (ref 11.5–15.5)
WBC: 9.3 10*3/uL (ref 4.0–10.5)

## 2012-05-27 LAB — TSH: TSH: 1.474 u[IU]/mL (ref 0.350–4.500)

## 2012-05-27 MED ORDER — MOXIFLOXACIN HCL 400 MG PO TABS: 400.0000 mg | ORAL_TABLET | Freq: Every day | ORAL | Status: AC

## 2012-05-27 MED ORDER — SODIUM CHLORIDE 0.9 % IJ SOLN: 3.0000 mL | INTRAMUSCULAR | Status: DC | PRN

## 2012-05-27 MED ORDER — CARVEDILOL 6.25 MG PO TABS: 6.2500 mg | ORAL_TABLET | Freq: Two times a day (BID) | ORAL | Status: DC

## 2012-05-27 MED ORDER — POTASSIUM CHLORIDE CRYS ER 20 MEQ PO TBCR: 40.0000 meq | EXTENDED_RELEASE_TABLET | Freq: Every day | ORAL | Status: DC

## 2012-05-27 MED ORDER — FUROSEMIDE 20 MG PO TABS
20.0000 mg | ORAL_TABLET | Freq: Every day | ORAL | Status: DC
  Administered 2012-05-27: 20 mg via ORAL
  Filled 2012-05-27: qty 1

## 2012-05-27 MED ORDER — FUROSEMIDE 20 MG PO TABS: 20.0000 mg | ORAL_TABLET | Freq: Every day | ORAL | Status: DC

## 2012-05-27 MED ORDER — INFLUENZA VIRUS VACC SPLIT PF IM SUSP
0.5000 mL | Freq: Once | INTRAMUSCULAR | Status: DC
  Filled 2012-05-27: qty 0.5

## 2012-05-27 MED ORDER — IRBESARTAN 150 MG PO TABS: 150.0000 mg | ORAL_TABLET | Freq: Every day | ORAL | Status: DC

## 2012-05-27 MED ORDER — SODIUM CHLORIDE 0.9 % IJ SOLN
3.0000 mL | Freq: Two times a day (BID) | INTRAMUSCULAR | Status: DC
  Administered 2012-05-27: 3 mL via INTRAVENOUS

## 2012-05-27 NOTE — Discharge Summary (Addendum)
Physician Discharge Summary  Patient ID: Linda Lowe MRN: [id number] DOB/AGE: [birth year] 61 y.o.  Admit date: 05/23/2012 Discharge date: 05/27/2012  Primary Care Physician:  Geraldo Pitter, MD  Cardiology: [facility]  Follow up 1. repeat CT in approximately 6 weeks to ensure clearance of the infiltrates 2. Repeat ECHO in 3 months to decide re: need for ICD 3. PCP in 1 week, please FU TSH and t4 4. Dr.Crenshaw, [place] [facility] in 1-2 months   Discharge Diagnoses:    Active Problems:  Acute systolic CHF (congestive heart failure) EF of 30-35%, NICM Cath 9/10 without significant coronary disease  DM (diabetes mellitus)  HTN (hypertension)  Pericardial effusion  Left lower lobe pneumonia  Dyslipidemia     Medication List     As of 05/27/2012  2:19 PM    STOP taking these medications         naproxen 500 MG tablet   Commonly known as: NAPROSYN      valsartan-hydrochlorothiazide 160-12.5 MG per tablet   Commonly known as: DIOVAN-HCT      TAKE these medications         buPROPion 300 MG 24 hr tablet   Commonly known as: WELLBUTRIN XL   Take 300 mg by mouth daily.      carvedilol 6.25 MG tablet   Commonly known as: COREG   Take 1 tablet (6.25 mg total) by mouth 2 (two) times daily with a meal.      EPIPEN 0.3 mg/0.3 mL Devi   Generic drug: EPINEPHrine   Inject 0.3 mg into the muscle as needed. For allergic reactions      furosemide 20 MG tablet   Commonly known as: LASIX   Take 1 tablet (20 mg total) by mouth daily.      irbesartan 150 MG tablet   Commonly known as: AVAPRO   Take 1 tablet (150 mg total) by mouth daily.      LEVEMIR FLEXPEN 100 UNIT/ML injection   Generic drug: insulin detemir   Inject 18-25 Units into the skin 2 (two) times daily before lunch and supper. Takes 18 units before lunch and 25 units before dinner      montelukast 10 MG tablet   Commonly known as: SINGULAIR   Take 10 mg by mouth at bedtime.      moxifloxacin 400 MG  tablet   Commonly known as: AVELOX   Take 1 tablet (400 mg total) by mouth daily at 8 pm. FOR 3 MORE DAYS      NOVOLOG FLEXPEN 100 UNIT/ML injection   Generic drug: insulin aspart   Inject 10-12 Units into the skin 2 (two) times daily before lunch and supper. Takes 10 units before lunch and 12 units before dinner      oxybutynin 5 MG 24 hr tablet   Commonly known as: DITROPAN-XL   Take 5 mg by mouth daily.      potassium chloride SA 20 MEQ tablet   Commonly known as: K-DUR,KLOR-CON   Take 2 tablets (40 mEq total) by mouth daily.      PRESCRIPTION MEDICATION   Apply 1 application topically daily as needed. Phenergan 25mg /mL gel.  Apply to skin on wrist for nausea from migraines.      simvastatin 40 MG tablet   Commonly known as: ZOCOR   Take 40 mg by mouth every evening.      sitaGLIPtan-metformin 50-500 MG per tablet   Commonly known as: JANUMET   Take 1 tablet by mouth 2 (  two) times daily with a meal.       Consults:  [place] [facility]  Significant Diagnostic Studies:  Ct Chest W Contrast  05/25/2012  *RADIOLOGY REPORT*  Clinical Data: Shortness of breath.  Abnormal echocardiogram.  CT CHEST WITH CONTRAST  Technique:  Multidetector CT imaging of the chest was performed following the standard protocol during bolus administration of intravenous contrast.  Contrast: 80mL OMNIPAQUE IOHEXOL 300 MG/ML  SOLN  Comparison: Chest radiograph 05/24/2012.  Findings: Sub-centimeter thyroid nodule(s) noted, too small to characterize, but most likely benign in the absence of known clinical risk factors for thyroid carcinoma. Mediastinal lymph nodes measure up to 10 mm in the low right paratracheal station. Right hilar lymph node measures 8 mm.  No left hilar or axillary adenopathy.  Pulmonary arteries and heart are enlarged.  Small pericardial effusion.  There is a small area of irregular airspace consolidation in the subpleural left lower lobe (image 41).  Prominent subpleural density is seen in  the medial aspect of the superior segment right lower lobe, in the paraspinal region (image 22).  Scattered scarring and linear volume loss at the lung bases.  No pleural fluid.  Airway is unremarkable.  Incidental imaging of the upper abdomen shows no acute findings. No worrisome lytic or sclerotic lesions.  IMPRESSION:  1.  Small pericardial effusion. 2.  Irregular subpleural airspace consolidation in the left lower lobe.  Follow-up to clearing is recommended. 3.  Paraspinal right lower lobe subpleural soft tissue density may be due to scarring.  This can be reevaluated on follow-up imaging as well. 4.  Pulmonary arterial hypertension.   Original Report Authenticated By: Reyes Ivan, M.D.    Echo 9/8 Study Conclusions - Left ventricle: The cavity size was normal. Wall thickness was increased in a pattern of mild LVH. Systolic function was moderately to severely reduced. The estimated ejection fraction was in the range of 30% to 35%. Doppler parameters are consistent with abnormal left ventricular relaxation (grade 1 diastolic dysfunction). - Mitral valve: Mild regurgitation. - Pericardium, extracardiac: Pericardial effusion surrounds heart. Mostly measures 10 mm . Largest pocket is posterior to RA at 23 mm. There is mild RA indentation. No RV collapse. Mtiral inflow is without signif variation. IVC decreases with inspiration Overall does not meet criteria for tamponade by echo criteria   Cath: 9/10 Left mainstem: Angiographically normal, divides into the LAD and left circumflex the  Left anterior descending (LAD): Widely patent throughout. This is a large caliber vessel that wraps around the left ventricular apex. It supplies 2 diagonal branches without significant stenoses.  Left circumflex (LCx): Patent without significant disease. Supplies a moderate to large caliber obtuse marginal branch in the second OM distribution. The first OM is tiny.  Right coronary artery (RCA): Large,  dominant vessel. No significant obstructive disease is noted. The PDA and PLA branches are widely patent.  Left ventriculography: Severe global hypokinesis of the left ventricle, the left ventricular ejection fraction is estimated at 30-35%.  Final Conclusions:  1. No significant coronary artery disease  2. Severe left ventricular dysfunction  3. Normal intracardiac hemodynamics   Brief H and P: Linda Lowe is a 60/F with PMH of HTN, DM reports progressive dyspnea on exertion for 2 weeks, associated with cough initially dry then productive of white phlegm.  She also experienced nausea and an episode of post tussive vomiting associated with chest tightness and pain up her chest briefly earlier today which has since resolved.  She denies any fevers, reports chills  Denies PND/orthopnea or swelling of feet.  EValuation in ER noted CXR w/ volume overload vs interstitial pneumonia  She was started on Rocephin and Zithromax in ER  Hospital Course:  Acute CHF exacerbation  Echo with EF of 35% Improved with diuresis Almost 8L diuresed during this admission  -continue ARB, ejection fraction 35-40%, patient on Diovan at home  -Cardiac catheterization 9/10 revealed nonobstructive CAD  -continue lasix 20mg  at DC Fu with [place] cards for repeat echo 3 months later; if EF < 35, would need ICD. TSH/free T4 pending  Pericardial effusion  -Tuberculosis very low suspicion-patient has no fevers, no weight loss, no other "B"symptoms  -Patient gives a history of a negative PPD 26 years ago when she came to [place] (from [place]) and a negative PPD again last year  -Can consider Quanti-FERON-TB Gold if suspicion remained high do to interobserver variability with PPD interpretation  -Patient remains hemodynamically stable  -No clinical or electrocardiographic signs of tamponade   Pulmonary infiltrates with mediastinal lymphadenopathy  -Will treat with antibiotics empirically at this time for CAP,  continue avelox for 3 more days -After treatment, plan to repeat CT in approximately 6 weeks to ensure clearance of the infiltrates and mediastinal lymphadenopathy, if doesn't resolve would recommend ID referral  Diabetes mellitus type 2  -Unusual outpatient regimen  - hemoglobin A1c 9.3  Resumed Levemir and novolog  -LDL 86, is not at goal, started on  simvastatin by Dr.Tat    Time spent on Discharge:  Signed: Nicolaas Savo [facility] Pager: [phone] 05/27/2012, 2:19 PM

## 2012-05-27 NOTE — Progress Notes (Signed)
Removed pressure dressing from R groin and upon palpation noticed small hematoma.  Slight capillary bleeding noted.  Placed band aid to site.  MD has been made aware.  Will continue to monitor.

## 2012-05-27 NOTE — Progress Notes (Signed)
@   Subjective:  Denies CP or dyspnea   Objective:  Filed Vitals:   05/26/12 1930 05/26/12 2000 05/26/12 2100 05/27/12 0511  BP: 145/73 138/61 146/79 120/65  Pulse: 91 80 96 76  Temp:   98.2 F (36.8 C) 98 F (36.7 C)  TempSrc:   Oral Oral  Resp:   18 18  Height:      Weight:    183 lb 3.2 oz (83.099 kg)  SpO2:    97%    Intake/Output from previous day:  Intake/Output Summary (Last 24 hours) at 05/27/12 0723 Last data filed at 05/26/12 2218  Gross per 24 hour  Intake    100 ml  Output    775 ml  Net   -675 ml    Physical Exam: Physical exam: Well-developed well-nourished in no acute distress.  Skin is warm and dry.  HEENT is normal.  Neck is supple.  Chest with mildly diminished BS bases bilaterally  Cardiovascular exam is regular rate and rhythm.  Abdominal exam nontender or distended. No masses palpated. Right groin with no hematoma and no bruit Extremities show no edema. neuro grossly intact    Lab Results: Basic Metabolic Panel:  Basename 05/27/12 0445 05/26/12 0615 05/25/12 0622  NA 138 139 --  K 3.9 3.4* --  CL 100 98 --  CO2 28 30 --  GLUCOSE 193* 230* --  BUN 20 20 --  CREATININE 0.62 0.62 --  CALCIUM 10.1 9.6 --  MG -- -- 2.1  PHOS -- -- --   CBC:  Basename 05/27/12 0445 05/26/12 0615  WBC 9.3 10.6*  NEUTROABS -- --  HGB 15.1* 15.1*  HCT 45.0 44.2  MCV 84.7 84.2  PLT 281 248   Cardiac Enzymes: No results found for this basename: CKTOTAL:3,CKMB:3,CKMBINDEX:3,TROPONINI:3 in the last 72 hours   Assessment/Plan:  1) Cardiomyopathy - Etiology unclear; cath reveals no CAD; Await TSH; continue coreg and avapro. Titrate as outpatient and repeat echo 3 months later; if EF < 35, would need ICD. 2) Acute systolic CHF - symptoms improved; PCWP 7 at time of cath; resume lasix 20 mg po daily; follow renal function. 3) Pericardial effusion - small to moderate; repeat echo in 2 weeks. Will leave ? Need for TB wu to primary care. 6) Abnormal chest CT  - will need FU chest CT per primary care. Olga Millers 05/27/2012, 7:23 AM

## 2012-05-27 NOTE — Progress Notes (Signed)
IV d/c'd.  Tele d/c'd.  Pt d/c'd to home.  Home meds and d/c instructions have been reviewed with pt.  Pt denies any questions or concerns at this time.  Pt leaving unit via wheelchair and appears in no acute distress.   Quantavis Obryant RN 

## 2012-06-05 DIAGNOSIS — Z23 Encounter for immunization: Secondary | ICD-10-CM | POA: Diagnosis not present

## 2012-06-05 DIAGNOSIS — I10 Essential (primary) hypertension: Secondary | ICD-10-CM | POA: Diagnosis not present

## 2012-06-05 DIAGNOSIS — IMO0001 Reserved for inherently not codable concepts without codable children: Secondary | ICD-10-CM | POA: Diagnosis not present

## 2012-06-09 ENCOUNTER — Other Ambulatory Visit (HOSPITAL_COMMUNITY): Payer: Self-pay | Admitting: Internal Medicine

## 2012-06-18 DIAGNOSIS — IMO0001 Reserved for inherently not codable concepts without codable children: Secondary | ICD-10-CM | POA: Diagnosis not present

## 2012-07-03 DIAGNOSIS — I10 Essential (primary) hypertension: Secondary | ICD-10-CM | POA: Diagnosis not present

## 2012-07-03 DIAGNOSIS — IMO0001 Reserved for inherently not codable concepts without codable children: Secondary | ICD-10-CM | POA: Diagnosis not present

## 2012-07-28 ENCOUNTER — Other Ambulatory Visit (HOSPITAL_COMMUNITY): Payer: Self-pay | Admitting: Internal Medicine

## 2012-09-21 DIAGNOSIS — I509 Heart failure, unspecified: Secondary | ICD-10-CM | POA: Diagnosis not present

## 2012-09-21 DIAGNOSIS — I319 Disease of pericardium, unspecified: Secondary | ICD-10-CM | POA: Diagnosis not present

## 2012-09-21 DIAGNOSIS — I10 Essential (primary) hypertension: Secondary | ICD-10-CM | POA: Diagnosis not present

## 2012-10-12 DIAGNOSIS — I319 Disease of pericardium, unspecified: Secondary | ICD-10-CM | POA: Diagnosis not present

## 2012-10-12 DIAGNOSIS — I509 Heart failure, unspecified: Secondary | ICD-10-CM | POA: Diagnosis not present

## 2012-10-23 DIAGNOSIS — I10 Essential (primary) hypertension: Secondary | ICD-10-CM | POA: Diagnosis not present

## 2012-10-23 DIAGNOSIS — E7800 Pure hypercholesterolemia, unspecified: Secondary | ICD-10-CM | POA: Diagnosis not present

## 2012-10-23 DIAGNOSIS — IMO0001 Reserved for inherently not codable concepts without codable children: Secondary | ICD-10-CM | POA: Diagnosis not present

## 2012-12-02 DIAGNOSIS — I509 Heart failure, unspecified: Secondary | ICD-10-CM | POA: Diagnosis not present

## 2013-01-22 DIAGNOSIS — IMO0001 Reserved for inherently not codable concepts without codable children: Secondary | ICD-10-CM | POA: Diagnosis not present

## 2013-01-22 DIAGNOSIS — E7800 Pure hypercholesterolemia, unspecified: Secondary | ICD-10-CM | POA: Diagnosis not present

## 2013-01-22 DIAGNOSIS — F329 Major depressive disorder, single episode, unspecified: Secondary | ICD-10-CM | POA: Diagnosis not present

## 2013-01-22 DIAGNOSIS — F3289 Other specified depressive episodes: Secondary | ICD-10-CM | POA: Diagnosis not present

## 2013-01-22 DIAGNOSIS — I10 Essential (primary) hypertension: Secondary | ICD-10-CM | POA: Diagnosis not present

## 2013-01-25 ENCOUNTER — Other Ambulatory Visit (HOSPITAL_COMMUNITY): Payer: Self-pay | Admitting: Family Medicine

## 2013-01-25 DIAGNOSIS — Z1231 Encounter for screening mammogram for malignant neoplasm of breast: Secondary | ICD-10-CM

## 2013-02-17 ENCOUNTER — Ambulatory Visit (HOSPITAL_COMMUNITY): Payer: Federal, State, Local not specified - PPO

## 2013-02-19 ENCOUNTER — Ambulatory Visit (HOSPITAL_COMMUNITY)
Admission: RE | Admit: 2013-02-19 | Discharge: 2013-02-19 | Disposition: A | Discharge status: Home / Self Care | Payer: Federal, State, Local not specified - PPO | Source: Ambulatory Visit | Attending: Family Medicine | Admitting: Family Medicine

## 2013-02-19 DIAGNOSIS — Z1231 Encounter for screening mammogram for malignant neoplasm of breast: Secondary | ICD-10-CM

## 2013-03-03 DIAGNOSIS — E785 Hyperlipidemia, unspecified: Secondary | ICD-10-CM | POA: Diagnosis not present

## 2013-03-03 DIAGNOSIS — I509 Heart failure, unspecified: Secondary | ICD-10-CM | POA: Diagnosis not present

## 2013-03-03 DIAGNOSIS — I10 Essential (primary) hypertension: Secondary | ICD-10-CM | POA: Diagnosis not present

## 2013-03-08 DIAGNOSIS — E7800 Pure hypercholesterolemia, unspecified: Secondary | ICD-10-CM | POA: Diagnosis not present

## 2013-03-08 DIAGNOSIS — IMO0001 Reserved for inherently not codable concepts without codable children: Secondary | ICD-10-CM | POA: Diagnosis not present

## 2013-03-08 DIAGNOSIS — F329 Major depressive disorder, single episode, unspecified: Secondary | ICD-10-CM | POA: Diagnosis not present

## 2013-03-08 DIAGNOSIS — I10 Essential (primary) hypertension: Secondary | ICD-10-CM | POA: Diagnosis not present

## 2013-03-08 DIAGNOSIS — F3289 Other specified depressive episodes: Secondary | ICD-10-CM | POA: Diagnosis not present

## 2013-04-07 ENCOUNTER — Ambulatory Visit: Payer: Federal, State, Local not specified - PPO | Admitting: Endocrinology

## 2013-04-14 ENCOUNTER — Encounter: Payer: Self-pay | Admitting: Endocrinology

## 2013-04-14 ENCOUNTER — Ambulatory Visit (INDEPENDENT_AMBULATORY_CARE_PROVIDER_SITE_OTHER): Payer: Federal, State, Local not specified - PPO | Admitting: Endocrinology

## 2013-04-14 VITALS — BP 136/72 | HR 69 | Temp 98.6°F | Resp 12 | Ht 63.0 in | Wt 185.4 lb

## 2013-04-14 DIAGNOSIS — I10 Essential (primary) hypertension: Secondary | ICD-10-CM

## 2013-04-14 DIAGNOSIS — IMO0001 Reserved for inherently not codable concepts without codable children: Secondary | ICD-10-CM

## 2013-04-14 NOTE — Patient Instructions (Addendum)
Please check blood sugars at least half the time about 2 hours after any meal and as directed on waking up. Please bring blood sugar monitor to each visit  GREEN LEVEMIR PEN 20 AT 9 AM AND 24 at [facility] TIME  Victoza, Orange pens same  Labs before visit

## 2013-04-14 NOTE — Progress Notes (Signed)
Patient ID: Linda Lowe, female   DOB: [birth year], 62 y.o.   MRN: [id number]  Reason for Appointment: Diabetes follow-up   History of Present Illness  Diagnosis: date of diagnosis: 3-4 years ago.  Monitors blood glucose: Once a day.  Glucometer: Contour.  Blood Glucose readings: 163 154 262 144 and bedtime: 202 153 198 116 92, unable to download monitor  Hypoglycemia frequency: none recently.  Meals: 2 meals per day usually at 11:30 a.m. and 7-8 p.m., she is following instructions given by  dietitian recently;  Physical activity: exercise: walks 10-15 min daily.  Certified Diabetes Educator visit: Most recent:, 12/12.  Dietician visit: Most recent:, 12/12.  Retinal exam: Most recent:5/12.  Pneumococcal immunization: 12/12.   PAST history: Her diabetes has been usually poorly controlled. She has been on multiple medications and basal bolus regimen also. However she has had difficulty with consistent compliance with insulin, diet and exercise regimen.  After repeated sessions with educators she was able to improve her compliance level and blood sugars had been improving since about 10/13. Also insulin doses had been increased. A1c previously had been usually over 8% and below this for the first time in 2/14.  RECENT history: She has been on various oral and injectable regiments or diabetes. She did not have dramatic improvements with Invokana but this has been continued. With starting Victoza her blood sugars appeared to be improved. On her last visit however she had misunderstood instructions and was not taking her Levemir insulin. She was only taking her NovoLog and Victoza and blood sugars were frequently over 200 Current insulin regimen: Levemir 22 units AT 9 AM AND 20 at suppertime. NOVOLOG pen 16 bid,  Victoza 1.2 mg daily at lunch.  Her blood sugars were unable to be reviewed by download today but they appear to be significantly better than the last time especially with some good readings  after supper but she has not checked any readings after breakfast, generally eating 2 meals a day  The last HbgA1c was 8.7 in 5/14 compared to 7.4 in 2/14, previous levels 9.6 in 2/13, in 6/13 was 8.1, 8.8% in 9/13  HYPERTENSION:  this is relatively mild and well controlled  No visits with results within 1 Week(s) from this visit. Latest known visit with results is:  Admission on 05/23/2012, Discharged on 05/27/2012  No results displayed because visit has over 200 results.        Medication List       This list is accurate as of: 04/14/13  9:30 AM.  Always use your most recent med list.               B-D UF III MINI PEN NEEDLES 31G X 5 MM Misc  Generic drug:  Insulin Pen Needle     BAYER CONTOUR NEXT TEST test strip  Generic drug:  glucose blood     buPROPion 300 MG 24 hr tablet  Commonly known as:  WELLBUTRIN XL  Take 300 mg by mouth daily.     carvedilol 6.25 MG tablet  Commonly known as:  COREG  Take 1 tablet (6.25 mg total) by mouth 2 (two) times daily with a meal.     [employer] Lancets Ultra Thin Misc     EPIPEN 0.3 mg/0.3 mL Devi  Generic drug:  EPINEPHrine  Inject 0.3 mg into the muscle as needed. For allergic reactions     furosemide 20 MG tablet  Commonly known as:  LASIX  Take 1 tablet (20  mg total) by mouth daily.     INVOKANA 100 MG Tabs  Generic drug:  Canagliflozin  100 mg.     irbesartan 150 MG tablet  Commonly known as:  AVAPRO  Take 1 tablet (150 mg total) by mouth daily.     LEVEMIR FLEXPEN 100 UNIT/ML injection  Generic drug:  insulin detemir  Inject 22 Units into the skin 2 (two) times daily before lunch and supper. Takes 22 units before lunch and 20 units before dinner     montelukast 10 MG tablet  Commonly known as:  SINGULAIR  Take 10 mg by mouth at bedtime.     naproxen 500 MG tablet  Commonly known as:  NAPROSYN  500 mg.     NOVOLOG FLEXPEN 100 UNIT/ML injection  Generic drug:  insulin aspart  Inject 16 Units into the skin 2  (two) times daily before lunch and supper. 16 units twice a day     ONGLYZA 5 MG Tabs tablet  Generic drug:  saxagliptin HCl  5 mg.     oxybutynin 5 MG 24 hr tablet  Commonly known as:  DITROPAN-XL  Take 5 mg by mouth daily.     oxybutynin 5 MG tablet  Commonly known as:  DITROPAN  Take 5 mg by mouth 3 (three) times daily.     potassium chloride SA 20 MEQ tablet  Commonly known as:  K-DUR,KLOR-CON  Take 2 tablets (40 mEq total) by mouth daily.     PRESCRIPTION MEDICATION  Apply 1 application topically daily as needed. Phenergan 25mg /mL gel.  Apply to skin on wrist for nausea from migraines.     simvastatin 40 MG tablet  Commonly known as:  ZOCOR  Take 40 mg by mouth every evening.     sitaGLIPtan-metformin 50-500 MG per tablet  Commonly known as:  JANUMET  Take 1 tablet by mouth 2 (two) times daily with a meal.     VICTOZA 18 MG/3ML Sopn  Generic drug:  Liraglutide  1.2 mg.        Allergies: No Known Allergies  Past Medical History  Diagnosis Date  . Diabetes mellitus     a. Dx > 5 y ago  . Hypertension   . Hypercholesteremia   . Arthritis   . Cardiomyopathy     a. 05/2012 Echo: EF 30-35%, Gr 1 DD, mild LVH, mild MR, pericardial effusion  . Pericardial effusion     a. 05/2012 Echo: circumferential pericardial effusion, mostly 10mm, largest pocket posterior - 23mm, mild RA indentation, no RV collapse, no tamponade.    Past Surgical History  Procedure Laterality Date  . Abdominal hysterectomy    . Foot surgery      Family History  Problem Relation Age of Onset  . Other      No history of CAD  . Tuberculosis Father     died when pt was young.    Social History:  reports that she has quit smoking. She does not have any smokeless tobacco history on file. She reports that she does not drink alcohol or use illicit drugs.  Review of Systems   No history of numbness in her feet   Examination:   BP 136/72  Pulse 69  Temp(Src) 98.6 F (37 C)  Resp 12   Ht 5\' 3"  (1.6 m)  Wt 185 lb 6.4 oz (84.097 kg)  BMI 32.85 kg/m2  SpO2 97%  Body mass index is 32.85 kg/(m^2).   Assesment:   Diabetes type 2, uncontrolled -  250.02  The patient's diabetes control appears to be relatively better controlled with restarting her Levemir insulin However she is still having relatively high readings in the morning and somewhat variable readings at night. She is also benefiting from Victoza  However she is not doing any readings after her first meal and not clear if  mealtime dose  needs to be adjusted In the morning  She will continue Victoza but increase her Levemir at night while reducing the morning dose to 20 Discussed frequency and timing of blood sugar monitoring and blood sugar targets  Trayven Lumadue 04/14/2013, 9:30 AM

## 2013-04-29 ENCOUNTER — Telehealth: Payer: Self-pay | Admitting: Endocrinology

## 2013-04-29 NOTE — Telephone Encounter (Signed)
No answer on home phone.

## 2013-04-29 NOTE — Telephone Encounter (Signed)
Pt is aware.  

## 2013-04-29 NOTE — Telephone Encounter (Signed)
Patient states her sugar was 56 this morning and she ate some ice cream.

## 2013-04-29 NOTE — Telephone Encounter (Signed)
She says it is now 250, wants to know what she should do?  Please advise

## 2013-04-29 NOTE — Telephone Encounter (Signed)
Reduce evening Levemir from 20 units down to 16 If the blood sugar is low she needs to drink 4 ounces of juice or regular soft drinks and not eat ice cream, blood sugar will come down on its own Continue other doses of insulin

## 2013-05-12 ENCOUNTER — Other Ambulatory Visit: Payer: Self-pay | Admitting: *Deleted

## 2013-05-12 MED ORDER — LIRAGLUTIDE 18 MG/3ML ~~LOC~~ SOPN: 1.2000 mg | PEN_INJECTOR | Freq: Every day | SUBCUTANEOUS | Status: DC

## 2013-05-25 ENCOUNTER — Telehealth: Payer: Self-pay | Admitting: Endocrinology

## 2013-05-25 MED ORDER — GLUCOSE BLOOD VI STRP: ORAL_STRIP | Status: DC

## 2013-05-25 NOTE — Telephone Encounter (Signed)
rx sent

## 2013-06-03 DIAGNOSIS — I10 Essential (primary) hypertension: Secondary | ICD-10-CM | POA: Diagnosis not present

## 2013-06-03 DIAGNOSIS — I428 Other cardiomyopathies: Secondary | ICD-10-CM | POA: Diagnosis not present

## 2013-06-03 DIAGNOSIS — I509 Heart failure, unspecified: Secondary | ICD-10-CM | POA: Diagnosis not present

## 2013-06-03 DIAGNOSIS — I5042 Chronic combined systolic (congestive) and diastolic (congestive) heart failure: Secondary | ICD-10-CM | POA: Diagnosis not present

## 2013-06-09 ENCOUNTER — Other Ambulatory Visit (INDEPENDENT_AMBULATORY_CARE_PROVIDER_SITE_OTHER): Payer: Federal, State, Local not specified - PPO

## 2013-06-09 DIAGNOSIS — IMO0001 Reserved for inherently not codable concepts without codable children: Secondary | ICD-10-CM

## 2013-06-09 LAB — LIPID PANEL
Cholesterol: 212 mg/dL — ABNORMAL HIGH (ref 0–200)
HDL: 51.7 mg/dL (ref >39.00)
Total CHOL/HDL Ratio: 4
Triglycerides: 136 mg/dL (ref 0.0–149.0)
VLDL: 27.2 mg/dL (ref 0.0–40.0)

## 2013-06-09 LAB — COMPREHENSIVE METABOLIC PANEL
ALT: 23 U/L (ref 0–35)
AST: 20 U/L (ref 0–37)
Albumin: 3.9 g/dL (ref 3.5–5.2)
Alkaline Phosphatase: 100 U/L (ref 39–117)
BUN: 12 mg/dL (ref 6–23)
CO2: 33 mEq/L — ABNORMAL HIGH (ref 19–32)
Calcium: 9.3 mg/dL (ref 8.4–10.5)
Chloride: 103 mEq/L (ref 96–112)
Creatinine, Ser: 0.6 mg/dL (ref 0.4–1.2)
GFR: 104.02 mL/min (ref >60.00)
Glucose, Bld: 169 mg/dL — ABNORMAL HIGH (ref 70–99)
Potassium: 3.5 mEq/L (ref 3.5–5.1)
Sodium: 140 mEq/L (ref 135–145)
Total Bilirubin: 0.5 mg/dL (ref 0.3–1.2)
Total Protein: 7.5 g/dL (ref 6.0–8.3)

## 2013-06-09 LAB — MICROALBUMIN / CREATININE URINE RATIO
Creatinine,U: 13.2 mg/dL
Microalb Creat Ratio: 24.2 mg/g (ref 0.0–30.0)
Microalb, Ur: 3.2 mg/dL — ABNORMAL HIGH (ref 0.0–1.9)

## 2013-06-09 LAB — HEMOGLOBIN A1C: Hgb A1c MFr Bld: 9.4 % — ABNORMAL HIGH (ref 4.6–6.5)

## 2013-06-09 LAB — LDL CHOLESTEROL, DIRECT: Direct LDL: 141 mg/dL

## 2013-06-16 ENCOUNTER — Ambulatory Visit: Payer: Federal, State, Local not specified - PPO | Admitting: Endocrinology

## 2013-06-22 ENCOUNTER — Ambulatory Visit (INDEPENDENT_AMBULATORY_CARE_PROVIDER_SITE_OTHER): Payer: Federal, State, Local not specified - PPO | Admitting: Endocrinology

## 2013-06-22 ENCOUNTER — Other Ambulatory Visit: Payer: Self-pay | Admitting: *Deleted

## 2013-06-22 ENCOUNTER — Encounter: Payer: Self-pay | Admitting: Endocrinology

## 2013-06-22 VITALS — BP 140/82 | HR 61 | Temp 98.5°F | Resp 14 | Ht 63.0 in | Wt 185.8 lb

## 2013-06-22 DIAGNOSIS — E785 Hyperlipidemia, unspecified: Secondary | ICD-10-CM | POA: Diagnosis not present

## 2013-06-22 DIAGNOSIS — I10 Essential (primary) hypertension: Secondary | ICD-10-CM | POA: Diagnosis not present

## 2013-06-22 DIAGNOSIS — IMO0001 Reserved for inherently not codable concepts without codable children: Secondary | ICD-10-CM | POA: Diagnosis not present

## 2013-06-22 MED ORDER — CANAGLIFLOZIN-METFORMIN HCL 150-1000 MG PO TABS: 150.0000 mg | ORAL_TABLET | Freq: Two times a day (BID) | ORAL | Status: DC

## 2013-06-22 MED ORDER — ATORVASTATIN CALCIUM 40 MG PO TABS: 40.0000 mg | ORAL_TABLET | Freq: Every day | ORAL | Status: DC

## 2013-06-22 NOTE — Progress Notes (Signed)
Patient ID: Linda Lowe, female   DOB: 11/07/1950, 62 y.o.   MRN: [id number]  Reason for Appointment: Diabetes follow-up   History of Present Illness   Diagnosis: date of diagnosis: 3-4 years ago.   PAST history: Her diabetes has been usually poorly controlled. She has been on multiple medications including Victoza and basal bolus regimen also. However she has had difficulty with consistent compliance with insulin, diet and exercise regimen. With starting Victoza her blood sugars appeared to be improved. After repeated sessions with educators she was able to improve her compliance level and blood sugars had been improving since about 10/13. Also insulin doses had been increased. A1c previously had been usually over 8% and below this for the first time in 2/14.  RECENT history: Her A1c has increased significantly and not clear why since her recent blood sugars look fairly good. She only has some high readings after eating breakfast probably from eating cereal now Also she has not been taking her Invokana which may be causing some of her hyperglycemia Currently not monitoring blood sugars after supper and these may be significantly high, discussed needing to check readings after supper also. She is appearing quite confused about her medications again and for some reason is taking Janumet along with her Victoza, is supposed to be on metformin  Current insulin regimen: Levemir 22 units AT 9 AM AND 16 at suppertime. NOVOLOG pen 16 bid,  Victoza 1.2 mg daily at lunch.  DIET: eating 2 meals a day, may be getting more carbohydrates like rice sometimes Monitors blood glucose: Once a day.  Glucometer: Contour.  Blood Glucose readings:  Morning 81-167, about 1-2 PM 100, 206, 249. 5-6 PM = 127-189 with overall average 158 for 2 weeks  Hypoglycemia frequency: none recently.   Meals: 2 meals per day usually at 11 a.m. and 7-8 p.m., she had been given instructions by dietitian; eating cereal at breakfast at  times however  Physical activity: exercise: walks 10-15 min daily.  Certified Diabetes Educator visit: Most recent:, 12/12.  Dietician visit: Most recent:, 12/12.  Retinal exam: Most recent:5/12.  Pneumococcal immunization: 12/12.   The last HbgA1c was 8.7 in 5/14 compared to 7.4 in 2/14, previous levels 9.6 in 2/13, in 6/13 was 8.1, 8.8% in 9/13  Wt Readings from Last 3 Encounters:  06/22/13 185 lb 12.8 oz (84.278 kg)  04/14/13 185 lb 6.4 oz (84.097 kg)  05/27/12 183 lb 3.2 oz (83.099 kg)     No visits with results within 1 Week(s) from this visit. Latest known visit with results is:  Appointment on 06/09/2013  Component Date Value Range Status  . Sodium 06/09/2013 140  135 - 145 mEq/L Final  . Potassium 06/09/2013 3.5  3.5 - 5.1 mEq/L Final  . Chloride 06/09/2013 103  96 - 112 mEq/L Final  . CO2 06/09/2013 33* 19 - 32 mEq/L Final  . Glucose, Bld 06/09/2013 169* 70 - 99 mg/dL Final  . BUN 14/78/2956 12  6 - 23 mg/dL Final  . Creatinine, Ser 06/09/2013 0.6  0.4 - 1.2 mg/dL Final  . Total Bilirubin 06/09/2013 0.5  0.3 - 1.2 mg/dL Final  . Alkaline Phosphatase 06/09/2013 100  39 - 117 U/L Final  . AST 06/09/2013 20  0 - 37 U/L Final  . ALT 06/09/2013 23  0 - 35 U/L Final  . Total Protein 06/09/2013 7.5  6.0 - 8.3 g/dL Final  . Albumin 21/30/8657 3.9  3.5 - 5.2 g/dL Final  . Calcium  06/09/2013 9.3  8.4 - 10.5 mg/dL Final  . GFR 16/06/9603 104.02  >60.00 mL/min Final  . Cholesterol 06/09/2013 212* 0 - 200 mg/dL Final   ATP III Classification       Desirable:  < 200 mg/dL               Borderline High:  200 - 239 mg/dL          High:  > = 540 mg/dL  . Triglycerides 06/09/2013 136.0  0.0 - 149.0 mg/dL Final   Normal:  <981 mg/dLBorderline High:  150 - 199 mg/dL  . HDL 06/09/2013 51.70  >39.00 mg/dL Final  . VLDL 19/14/7829 27.2  0.0 - 40.0 mg/dL Final  . Total CHOL/HDL Ratio 06/09/2013 4   Final                  Men          Women1/2 Average Risk     3.4          3.3Average Risk           5.0          4.42X Average Risk          9.6          7.13X Average Risk          15.0          11.0                      . Hemoglobin A1C 06/09/2013 9.4* 4.6 - 6.5 % Final   Glycemic Control Guidelines for People with Diabetes:Non Diabetic:  <6%Goal of Therapy: <7%Additional Action Suggested:  >8%   . Microalb, Ur 06/09/2013 3.2* 0.0 - 1.9 mg/dL Final  . Creatinine,U 56/21/3086 13.2   Final  . Microalb Creat Ratio 06/09/2013 24.2  0.0 - 30.0 mg/g Final  . Direct LDL 06/09/2013 141.0   Final   Optimal:  <100 mg/dLNear or Above Optimal:  100-129 mg/dLBorderline High:  130-159 mg/dLHigh:  160-189 mg/dLVery High:  >190 mg/dL      Medication List       This list is accurate as of: 06/22/13 11:59 PM.  Always use your most recent med list.               amLODipine 5 MG tablet  Commonly known as:  NORVASC     atorvastatin 40 MG tablet  Commonly known as:  LIPITOR  Take 1 tablet (40 mg total) by mouth daily.     B-D UF III MINI PEN NEEDLES 31G X 5 MM Misc  Generic drug:  Insulin Pen Needle     buPROPion 300 MG 24 hr tablet  Commonly known as:  WELLBUTRIN XL  Take 300 mg by mouth daily.     Canagliflozin-Metformin HCl [id number] MG Tabs  Take 150-1,000 mg by mouth 2 (two) times daily.     carvedilol 6.25 MG tablet  Commonly known as:  COREG  Take 6.25 mg by mouth 2 (two) times daily with a meal.     chlorpheniramine-HYDROcodone 10-8 MG/5ML Lqcr  Commonly known as:  TUSSIONEX     [employer] Lancets Ultra Thin Misc     EPIPEN 0.3 mg/0.3 mL Devi  Generic drug:  EPINEPHrine  Inject 0.3 mg into the muscle as needed. For allergic reactions     glucose blood test strip  Commonly known as:  BAYER CONTOUR NEXT TEST  Use to check blood sugars  once a day     INVOKANA 100 MG Tabs  Generic drug:  Canagliflozin  100 mg.     irbesartan 150 MG tablet  Commonly known as:  AVAPRO  Take 1 tablet (150 mg total) by mouth daily.     LEVEMIR FLEXPEN 100 UNIT/ML injection  Generic drug:   insulin detemir  Inject 22 Units into the skin 2 (two) times daily before lunch and supper. Takes 22 units before lunch and 16 units before dinner     Liraglutide 18 MG/3ML Sopn  Commonly known as:  VICTOZA  Inject 1.2 mg into the skin daily.     montelukast 10 MG tablet  Commonly known as:  SINGULAIR  Take 10 mg by mouth at bedtime.     naproxen 500 MG tablet  Commonly known as:  NAPROSYN  500 mg.     NOVOLOG FLEXPEN 100 UNIT/ML injection  Generic drug:  insulin aspart  Inject 16 Units into the skin 2 (two) times daily before lunch and supper. 16 units twice a day     oxybutynin 5 MG tablet  Commonly known as:  DITROPAN  Take 5 mg by mouth 3 (three) times daily.     potassium chloride SA 20 MEQ tablet  Commonly known as:  K-DUR,KLOR-CON  Take 2 tablets (40 mEq total) by mouth daily.     PRESCRIPTION MEDICATION  Apply 1 application topically daily as needed. Phenergan 25mg /mL gel.  Apply to skin on wrist for nausea from migraines.     simvastatin 40 MG tablet  Commonly known as:  ZOCOR  Take 40 mg by mouth every evening.     sitaGLIPtin-metformin 50-500 MG per tablet  Commonly known as:  JANUMET  Take 1 tablet by mouth 2 (two) times daily with a meal.        Allergies: No Known Allergies  Past Medical History  Diagnosis Date  . Diabetes mellitus     a. Dx > 5 y ago  . Hypertension   . Hypercholesteremia   . Arthritis   . Cardiomyopathy     a. 05/2012 Echo: EF 30-35%, Gr 1 DD, mild LVH, mild MR, pericardial effusion  . Pericardial effusion     a. 05/2012 Echo: circumferential pericardial effusion, mostly 10mm, largest pocket posterior - 23mm, mild RA indentation, no RV collapse, no tamponade.    Past Surgical History  Procedure Laterality Date  . Abdominal hysterectomy    . Foot surgery      Family History  Problem Relation Age of Onset  . Other      No history of CAD  . Tuberculosis Father     died when pt was young.    Social History:  reports that  she has quit smoking. She does not have any smokeless tobacco history on file. She reports that she does not drink alcohol or use illicit drugs.  Review of Systems   Lipids: LDL is now 141. She thinks she is taking simvastatin although did not bring the bottle for this along with her medications and not clear if she is taking this regularly  Hypertension: Has fair control  No history of numbness in her feet   Examination:   BP 140/82  Pulse 61  Temp(Src) 98.5 F (36.9 C)  Resp 14  Ht 5\' 3"  (1.6 m)  Wt 185 lb 12.8 oz (84.278 kg)  BMI 32.92 kg/m2  SpO2 95%  Body mass index is 32.92 kg/(m^2).   Assesment:   Diabetes type 2,  uncontrolled - 250.02 Her A1c has increased significantly and not clear why since her recent blood sugars look fairly good. She only has some high readings after eating breakfast probably from eating cereal now Also she has not been taking her Invokana which may be causing some of her hyperglycemia Currently not monitoring blood sugars after supper and these may be significantly high, discussed needing to check readings after supper also. She is appearing quite confused about her medications again and for some reason is taking Janumet along with her Victoza, is supposed to be on metformin  HYPERLIPIDEMIA: LDL is not controlled, this may be from noncompliance with simvastatin  Plan:  She will continue Victoza but increase her Levemir in the morning to 24 since readings are relatively higher at supper compared to breakfast Start Invokana and stop Janumet. For convenience we'll give her the combination of Invokana and metformin Written instructions given Encouraged her to walk more regularly She will stop eating cereal in the morning and given her examples of high protein foods to have instead Discussed frequency and timing of blood sugar monitoring and blood sugar targets  Hyperlipidemia: She claims she is taking her simvastatin and since LDL is high will try  her on Lipitor instead  Counseling time over 50% of today's 25 minute visit  Linda Lowe 06/23/2013, 2:49 PM

## 2013-06-22 NOTE — Patient Instructions (Addendum)
Invokamet 150/1000 twice daily  STOP JANUMET  LEVEMIR 24 IN AM, 16 IN PM  Please check blood sugars at least half the time about 2 hours after any meal and as directed on waking up. Please bring blood sugar monitor to each visit  NO CEREAL IN AMS  CHANGE SIMVASTATIN to Lipitor 40mg 

## 2013-06-23 DIAGNOSIS — E785 Hyperlipidemia, unspecified: Secondary | ICD-10-CM | POA: Insufficient documentation

## 2013-06-29 ENCOUNTER — Encounter (HOSPITAL_COMMUNITY): Payer: Self-pay | Admitting: Emergency Medicine

## 2013-06-29 DIAGNOSIS — E119 Type 2 diabetes mellitus without complications: Secondary | ICD-10-CM | POA: Diagnosis not present

## 2013-06-29 DIAGNOSIS — K625 Hemorrhage of anus and rectum: Secondary | ICD-10-CM | POA: Diagnosis not present

## 2013-06-29 DIAGNOSIS — E7800 Pure hypercholesterolemia, unspecified: Secondary | ICD-10-CM | POA: Insufficient documentation

## 2013-06-29 DIAGNOSIS — M129 Arthropathy, unspecified: Secondary | ICD-10-CM | POA: Diagnosis not present

## 2013-06-29 DIAGNOSIS — I10 Essential (primary) hypertension: Secondary | ICD-10-CM | POA: Diagnosis not present

## 2013-06-29 DIAGNOSIS — Z79899 Other long term (current) drug therapy: Secondary | ICD-10-CM | POA: Diagnosis not present

## 2013-06-29 DIAGNOSIS — Z794 Long term (current) use of insulin: Secondary | ICD-10-CM | POA: Insufficient documentation

## 2013-06-29 DIAGNOSIS — R197 Diarrhea, unspecified: Secondary | ICD-10-CM | POA: Diagnosis not present

## 2013-06-29 DIAGNOSIS — R112 Nausea with vomiting, unspecified: Secondary | ICD-10-CM | POA: Insufficient documentation

## 2013-06-29 DIAGNOSIS — Z87891 Personal history of nicotine dependence: Secondary | ICD-10-CM | POA: Diagnosis not present

## 2013-06-29 LAB — COMPREHENSIVE METABOLIC PANEL
ALT: 22 U/L (ref 0–35)
AST: 27 U/L (ref 0–37)
Albumin: 4.2 g/dL (ref 3.5–5.2)
Alkaline Phosphatase: 101 U/L (ref 39–117)
BUN: 16 mg/dL (ref 6–23)
CO2: 28 mEq/L (ref 19–32)
Calcium: 9.3 mg/dL (ref 8.4–10.5)
Chloride: 101 mEq/L (ref 96–112)
Creatinine, Ser: 0.71 mg/dL (ref 0.50–1.10)
GFR calc Af Amer: >90 mL/min (ref >90)
GFR calc non Af Amer: >90 mL/min (ref >90)
Glucose, Bld: 133 mg/dL — ABNORMAL HIGH (ref 70–99)
Potassium: 3.4 mEq/L — ABNORMAL LOW (ref 3.5–5.1)
Sodium: 144 mEq/L (ref 135–145)
Total Bilirubin: 0.4 mg/dL (ref 0.3–1.2)
Total Protein: 8.1 g/dL (ref 6.0–8.3)

## 2013-06-29 LAB — CBC WITH DIFFERENTIAL/PLATELET
Basophils Absolute: 0 10*3/uL (ref 0.0–0.1)
Basophils Relative: 0 % (ref 0–1)
Eosinophils Absolute: 0.2 10*3/uL (ref 0.0–0.7)
Eosinophils Relative: 2 % (ref 0–5)
HCT: 43.1 % (ref 36.0–46.0)
Hemoglobin: 15.5 g/dL — ABNORMAL HIGH (ref 12.0–15.0)
Lymphocytes Relative: 35 % (ref 12–46)
Lymphs Abs: 3.5 10*3/uL (ref 0.7–4.0)
MCH: 30.6 pg (ref 26.0–34.0)
MCHC: 36 g/dL (ref 30.0–36.0)
MCV: 85 fL (ref 78.0–100.0)
Monocytes Absolute: 0.6 10*3/uL (ref 0.1–1.0)
Monocytes Relative: 6 % (ref 3–12)
Neutro Abs: 5.7 10*3/uL (ref 1.7–7.7)
Neutrophils Relative %: 57 % (ref 43–77)
Platelets: 293 10*3/uL (ref 150–400)
RBC: 5.07 MIL/uL (ref 3.87–5.11)
RDW: 13 % (ref 11.5–15.5)
WBC: 10 10*3/uL (ref 4.0–10.5)

## 2013-06-29 MED ORDER — ONDANSETRON 4 MG PO TBDP
4.0000 mg | ORAL_TABLET | Freq: Once | ORAL | Status: AC
  Administered 2013-06-29: 4 mg via ORAL
  Filled 2013-06-29: qty 1

## 2013-06-29 NOTE — ED Notes (Signed)
Pt states vomiting for today. Pt states for the past 4 days but not today black stool. Pt states that she has been vomiting today which is new.

## 2013-06-30 ENCOUNTER — Emergency Department (HOSPITAL_COMMUNITY): Payer: Federal, State, Local not specified - PPO

## 2013-06-30 ENCOUNTER — Emergency Department (HOSPITAL_COMMUNITY)
Admission: EM | Admit: 2013-06-30 | Discharge: 2013-06-30 | Disposition: A | Discharge status: Home / Self Care | Payer: Federal, State, Local not specified - PPO | Attending: Emergency Medicine | Admitting: Emergency Medicine

## 2013-06-30 DIAGNOSIS — R112 Nausea with vomiting, unspecified: Secondary | ICD-10-CM

## 2013-06-30 DIAGNOSIS — R197 Diarrhea, unspecified: Secondary | ICD-10-CM

## 2013-06-30 LAB — URINALYSIS, ROUTINE W REFLEX MICROSCOPIC
Bilirubin Urine: NEGATIVE
Glucose, UA: >1000 mg/dL — AB
Hgb urine dipstick: NEGATIVE
Ketones, ur: 15 mg/dL — AB
Leukocytes, UA: NEGATIVE
Nitrite: NEGATIVE
Protein, ur: NEGATIVE mg/dL
Specific Gravity, Urine: 1.025 (ref 1.005–1.030)
Urobilinogen, UA: 0.2 mg/dL (ref 0.0–1.0)
pH: 6 (ref 5.0–8.0)

## 2013-06-30 LAB — URINE MICROSCOPIC-ADD ON

## 2013-06-30 LAB — OCCULT BLOOD, POC DEVICE: Fecal Occult Bld: NEGATIVE

## 2013-06-30 MED ORDER — SODIUM CHLORIDE 0.9 % IV SOLN
INTRAVENOUS | Status: DC
  Administered 2013-06-30: 01:00:00 via INTRAVENOUS

## 2013-06-30 MED ORDER — ONDANSETRON HCL 4 MG PO TABS: 4.0000 mg | ORAL_TABLET | Freq: Four times a day (QID) | ORAL | Status: DC

## 2013-06-30 NOTE — ED Notes (Signed)
Pt given a sprite 

## 2013-06-30 NOTE — ED Notes (Signed)
Lab called to inquire about urinalysis.

## 2013-06-30 NOTE — ED Notes (Signed)
Per RN, pt drank liquids without difficulties, tolerated fluids well.

## 2013-06-30 NOTE — ED Provider Notes (Signed)
CSN: [id number]     Arrival date & time 06/29/13  2038 History   First MD Initiated Contact with Patient 06/30/13 0018     Chief Complaint  Patient presents with  . Rectal Bleeding  . Emesis   (Consider location/radiation/quality/duration/timing/severity/associated sxs/prior Treatment) HPI History provided by patient. Has had some nausea and vomiting for the last 2 days. She is able to eat breakfast this morning due to nausea has not eaten anything else today. She denies any associated abdominal pain. She's been having loose stools for the last 4 days and initially noticed what looked like blood in her stool with some dark color to it. No history of GI bleeding. No blood thinners. No fevers or chills. Today her stool has been normal.  She denies any recent travel or known sick contacts. [facility] and is treated for diabetes, hypertension and high cholesterol. No chest pain or shortness of breath. No dysuria, urgency or frequency. No blood in emesis.  Past Medical History  Diagnosis Date  . Diabetes mellitus     a. Dx > 5 y ago  . Hypertension   . Hypercholesteremia   . Arthritis   . Cardiomyopathy     a. 05/2012 Echo: EF 30-35%, Gr 1 DD, mild LVH, mild MR, pericardial effusion  . Pericardial effusion     a. 05/2012 Echo: circumferential pericardial effusion, mostly 10mm, largest pocket posterior - 23mm, mild RA indentation, no RV collapse, no tamponade.   Past Surgical History  Procedure Laterality Date  . Abdominal hysterectomy    . Foot surgery     Family History  Problem Relation Age of Onset  . Other      No history of CAD  . Tuberculosis Father     died when pt was young.   History  Substance Use Topics  . Smoking status: Former Games developer  . Smokeless tobacco: Not on file     Comment: Quit "long time ago."  . Alcohol Use: No   OB History   Grav Para Term Preterm Abortions TAB SAB Ect Mult Living                 Review of Systems   Constitutional: Negative for fever and chills.  Eyes: Negative for visual disturbance.  Respiratory: Negative for shortness of breath.   Cardiovascular: Negative for chest pain.  Gastrointestinal: Positive for nausea and vomiting. Negative for abdominal pain.  Genitourinary: Negative for dysuria.  Musculoskeletal: Negative for back pain, neck pain and neck stiffness.  Skin: Negative for rash.  Neurological: Negative for headaches.  All other systems reviewed and are negative.    Allergies  Review of patient's allergies indicates no known allergies.  Home Medications   Current Outpatient Rx  Name  Route  Sig  Dispense  Refill  . amLODipine (NORVASC) 5 MG tablet               . atorvastatin (LIPITOR) 40 MG tablet   Oral   Take 1 tablet (40 mg total) by mouth daily.   30 tablet   5   . B-D UF III MINI PEN NEEDLES 31G X 5 MM MISC               . buPROPion (WELLBUTRIN XL) 300 MG 24 hr tablet   Oral   Take 300 mg by mouth daily.         . Canagliflozin-Metformin HCl [phone] MG TABS   Oral   Take  150-1,000 mg by mouth 2 (two) times daily.   60 tablet   5   . carvedilol (COREG) 6.25 MG tablet   Oral   Take 6.25 mg by mouth 2 (two) times daily with a meal.         . chlorpheniramine-HYDROcodone (TUSSIONEX) 10-8 MG/5ML LQCR               . [employer] Lancets Ultra Thin MISC               . EPINEPHrine (EPIPEN) 0.3 mg/0.3 mL DEVI   Intramuscular   Inject 0.3 mg into the muscle as needed. For allergic reactions         . glucose blood (BAYER CONTOUR NEXT TEST) test strip      Use to check blood sugars once a day   50 each   5   . insulin aspart (NOVOLOG FLEXPEN) 100 UNIT/ML injection   Subcutaneous   Inject 16 Units into the skin 2 (two) times daily before lunch and supper. 16 units twice a day         . insulin detemir (LEVEMIR FLEXPEN) 100 UNIT/ML injection   Subcutaneous   Inject 22 Units into the skin 2 (two) times daily before lunch and  supper. Takes 22 units before lunch and 16 units before dinner         . INVOKANA 100 MG TABS      100 mg.         . EXPIRED: irbesartan (AVAPRO) 150 MG tablet   Oral   Take 1 tablet (150 mg total) by mouth daily.   30 tablet   0   . Liraglutide (VICTOZA) 18 MG/3ML SOPN   Subcutaneous   Inject 1.2 mg into the skin daily.   2 pen   5   . montelukast (SINGULAIR) 10 MG tablet   Oral   Take 10 mg by mouth at bedtime.         . naproxen (NAPROSYN) 500 MG tablet      500 mg.         . oxybutynin (DITROPAN) 5 MG tablet   Oral   Take 5 mg by mouth 3 (three) times daily.         . potassium chloride SA (K-DUR,KLOR-CON) 20 MEQ tablet   Oral   Take 2 tablets (40 mEq total) by mouth daily.   30 tablet   0   . PRESCRIPTION MEDICATION   Topical   Apply 1 application topically daily as needed. Phenergan 25mg /mL gel.  Apply to skin on wrist for nausea from migraines.         . simvastatin (ZOCOR) 40 MG tablet   Oral   Take 40 mg by mouth every evening.         . sitaGLIPtan-metformin (JANUMET) 50-500 MG per tablet   Oral   Take 1 tablet by mouth 2 (two) times daily with a meal.          BP 170/76  Pulse 75  Temp(Src) 98.4 F (36.9 C) (Oral)  Resp 16  Wt 179 lb 8 oz (81.421 kg)  BMI 31.81 kg/m2  SpO2 94% Physical Exam  Constitutional: She is oriented to person, place, and time. She appears well-developed and well-nourished.  HENT:  Head: Normocephalic and atraumatic.  Eyes: EOM are normal. Pupils are equal, round, and reactive to light. No scleral icterus.  Neck: Neck supple.  Cardiovascular: Normal rate, regular rhythm and intact distal pulses.  Pulmonary/Chest: Effort normal and breath sounds normal. No respiratory distress. She exhibits no tenderness.  Abdominal: Soft. Bowel sounds are normal. She exhibits no distension. There is no tenderness. There is no rebound.  Genitourinary:  Rectal exam: Nontender, brown stool, no fissures   Musculoskeletal: Normal range of motion. She exhibits no edema.  Neurological: She is alert and oriented to person, place, and time. No cranial nerve deficit.  Skin: Skin is warm and dry.    ED Course  Procedures (including critical care time) Labs Review Labs Reviewed  CBC WITH DIFFERENTIAL - Abnormal; Notable for the following:    Hemoglobin 15.5 (*)    All other components within normal limits  COMPREHENSIVE METABOLIC PANEL - Abnormal; Notable for the following:    Potassium 3.4 (*)    Glucose, Bld 133 (*)    All other components within normal limits  URINALYSIS, ROUTINE W REFLEX MICROSCOPIC - Abnormal; Notable for the following:    Glucose, UA >1000 (*)    Ketones, ur 15 (*)    All other components within normal limits  URINE MICROSCOPIC-ADD ON  OCCULT BLOOD, POC DEVICE   Imaging Review Dg Chest 2 View  06/30/2013   *RADIOLOGY REPORT*  Clinical Data: Cough and emesis.  CHEST - 2 VIEW  Comparison: Chest radiograph May 24, 2012.  Findings: Cardiac silhouette is mild to moderately enlarged, improved from prior examination.  Mild chronic interstitial changes without pleural effusions or focal consolidations.  Pulmonary vasculature is non suspicious, improved from prior examination.  No pneumothorax.  Soft tissue planes and included osseous structures are not suspicious.  IMPRESSION: Mild to moderate cardiomegaly, no acute pulmonary process; improved appearance of chest from May 24, 2012   Original Report Authenticated By: Pernell Dupre Bloomer    IV fluids. Zofran.  4:29 AM on recheck is tolerating PO fluids without emesis and nausea completely resolved. Repeat abdominal exam remains benign.  Plan discharge home with prescription for Zofran and close outpatient followup. GI referral for reported blood in stool. Followup primary care physician for any recurrent symptoms. Return precautions verbalized as understood  MDM  Diagnosis: Nausea vomiting diarrhea  Labs reviewed.  Guaiac negative brown stool, no anemia, mild hypokalemia. Symptomatically improved with IV fluids and Zofran. Chest x-ray obtained for borderline hypoxia although patient denies any respiratory complaints and has normal pulmonary exam - history of CHF with mild to moderate cardiomegaly today. Vital signs and nursing notes reviewed and considered.     Linda Nielsen, MD 06/30/13 [phone]

## 2013-07-02 ENCOUNTER — Other Ambulatory Visit: Payer: Self-pay | Admitting: *Deleted

## 2013-07-02 MED ORDER — INSULIN PEN NEEDLE 31G X 5 MM MISC: Status: DC

## 2013-07-22 DIAGNOSIS — I428 Other cardiomyopathies: Secondary | ICD-10-CM | POA: Diagnosis not present

## 2013-07-22 DIAGNOSIS — I509 Heart failure, unspecified: Secondary | ICD-10-CM | POA: Diagnosis not present

## 2013-07-22 DIAGNOSIS — I5042 Chronic combined systolic (congestive) and diastolic (congestive) heart failure: Secondary | ICD-10-CM | POA: Diagnosis not present

## 2013-07-22 DIAGNOSIS — I447 Left bundle-branch block, unspecified: Secondary | ICD-10-CM | POA: Diagnosis not present

## 2013-07-23 ENCOUNTER — Other Ambulatory Visit: Payer: Self-pay | Admitting: *Deleted

## 2013-07-23 MED ORDER — INSULIN PEN NEEDLE 31G X 5 MM MISC: Status: DC

## 2013-07-29 ENCOUNTER — Other Ambulatory Visit (INDEPENDENT_AMBULATORY_CARE_PROVIDER_SITE_OTHER): Payer: Federal, State, Local not specified - PPO

## 2013-07-29 DIAGNOSIS — E785 Hyperlipidemia, unspecified: Secondary | ICD-10-CM

## 2013-07-29 DIAGNOSIS — IMO0001 Reserved for inherently not codable concepts without codable children: Secondary | ICD-10-CM

## 2013-07-29 LAB — COMPREHENSIVE METABOLIC PANEL
ALT: 19 U/L (ref 0–35)
AST: 20 U/L (ref 0–37)
Albumin: 4 g/dL (ref 3.5–5.2)
Alkaline Phosphatase: 90 U/L (ref 39–117)
BUN: 19 mg/dL (ref 6–23)
CO2: 29 mEq/L (ref 19–32)
Calcium: 9.3 mg/dL (ref 8.4–10.5)
Chloride: 102 mEq/L (ref 96–112)
Creatinine, Ser: 0.6 mg/dL (ref 0.4–1.2)
GFR: 100.23 mL/min (ref >60.00)
Glucose, Bld: 108 mg/dL — ABNORMAL HIGH (ref 70–99)
Potassium: 3 mEq/L — ABNORMAL LOW (ref 3.5–5.1)
Sodium: 138 mEq/L (ref 135–145)
Total Bilirubin: 0.6 mg/dL (ref 0.3–1.2)
Total Protein: 7.6 g/dL (ref 6.0–8.3)

## 2013-07-29 LAB — LIPID PANEL
Cholesterol: 125 mg/dL (ref 0–200)
HDL: 50.5 mg/dL (ref >39.00)
LDL Cholesterol: 63 mg/dL (ref 0–99)
Total CHOL/HDL Ratio: 2
Triglycerides: 57 mg/dL (ref 0.0–149.0)
VLDL: 11.4 mg/dL (ref 0.0–40.0)

## 2013-07-29 LAB — FRUCTOSAMINE: Fructosamine: 268 umol/L (ref <285)

## 2013-08-02 ENCOUNTER — Other Ambulatory Visit: Payer: Self-pay | Admitting: *Deleted

## 2013-08-02 MED ORDER — GLUCOSE BLOOD VI STRP: ORAL_STRIP | Status: DC

## 2013-08-03 ENCOUNTER — Encounter: Payer: Self-pay | Admitting: Endocrinology

## 2013-08-03 ENCOUNTER — Ambulatory Visit (INDEPENDENT_AMBULATORY_CARE_PROVIDER_SITE_OTHER): Payer: Federal, State, Local not specified - PPO | Admitting: Endocrinology

## 2013-08-03 VITALS — BP 138/90 | HR 58 | Temp 98.5°F | Resp 12 | Ht 63.0 in | Wt 179.0 lb

## 2013-08-03 DIAGNOSIS — E785 Hyperlipidemia, unspecified: Secondary | ICD-10-CM

## 2013-08-03 DIAGNOSIS — IMO0001 Reserved for inherently not codable concepts without codable children: Secondary | ICD-10-CM

## 2013-08-03 MED ORDER — POTASSIUM CHLORIDE CRYS ER 20 MEQ PO TBCR: 20.0000 meq | EXTENDED_RELEASE_TABLET | Freq: Every day | ORAL | Status: DC

## 2013-08-03 NOTE — Progress Notes (Addendum)
Patient ID: Linda Lowe, female   DOB: Aug 09, 1951, 62 y.o.   MRN: [id number]  Reason for Appointment: Diabetes follow-up   History of Present Illness   Diagnosis: date of diagnosis: 3-4 years ago.   PAST history: Her diabetes has been usually poorly controlled. She has been on multiple medications including Victoza and basal bolus regimen also. However she has had difficulty with consistent compliance with insulin, diet and exercise regimen. With starting Victoza her blood sugars appeared to be improved. After repeated sessions with educators she was able to improve her compliance level and blood sugars had been improving since about 10/13. Also insulin doses had been increased. A1c previously had been usually over 8% and below this for the first time in 2/14.  RECENT history: On her last visit her sugars were poorly controlled and A1c had increased significantly However her blood sugars at home were not particularly high She was told to increase her Levemir to 24 but she did not do so Also has been switched to a combination of Invokana and metformin instead of Janumet She does appear to have excellent blood sugars at home recently although is checking them only after breakfast and lunch Fructosamine is in the normal range now Her glucose average at home is 129 but has a number of high readings periodically as well as readings as low as 47 after breakfast  Difficult to get a good history about her diet but may not be getting balanced meals consistently Also taking Victoza as directed  Current insulin regimen: Levemir 22 units AT 9 AM AND 16 at bedtime. NOVOLOG pen 16 bid  Victoza 1.2 mg daily at lunch.   Glucometer: Contour.  Blood Glucose readings: PC breakfast 47-235 with mostly near normal readings recently, late night 52-213 and mostly under 110 recently Hypoglycemia frequency: Has about 3 episodes in the last month after breakfast and also after supper with readings mostly in the  50s Overall average 129, checking yearly 2 times a day on average  DIET: eating 2 meals a day, may be getting more carbohydrates like rice sometimes Meals: 2 meals per day usually at 11 a.m. and 7-8 p.m.  Physical activity: exercise: walks 10-15 min daily.  Certified Diabetes Educator visit: Most recent:, 12/12.  Dietician visit: Most recent:, 12/12.  Retinal exam: Most recent:5/12.  Pneumococcal immunization: 12/12.   Wt Readings from Last 3 Encounters:  08/03/13 179 lb (81.194 kg)  06/29/13 179 lb 8 oz (81.421 kg)  06/22/13 185 lb 12.8 oz (84.278 kg)   LABS: Diabetes labs Lab Results  Component Value Date   HGBA1C 9.4* 06/09/2013   HGBA1C 9.3* 05/23/2012   Lab Results  Component Value Date   MICROALBUR 3.2* 06/09/2013   LDLCALC 63 07/29/2013   CREATININE 0.6 07/29/2013     Appointment on 07/29/2013  Component Date Value Range Status  . Cholesterol 07/29/2013 125  0 - 200 mg/dL Final   ATP III Classification       Desirable:  < 200 mg/dL               Borderline High:  200 - 239 mg/dL          High:  > = 409 mg/dL  . Triglycerides 07/29/2013 57.0  0.0 - 149.0 mg/dL Final   Normal:  <811 mg/dLBorderline High:  150 - 199 mg/dL  . HDL 07/29/2013 50.50  >39.00 mg/dL Final  . VLDL 91/47/8295 11.4  0.0 - 40.0 mg/dL Final  . LDL Cholesterol 07/29/2013  63  0 - 99 mg/dL Final  . Total CHOL/HDL Ratio 07/29/2013 2   Final                  Men          Women1/2 Average Risk     3.4          3.3Average Risk          5.0          4.42X Average Risk          9.6          7.13X Average Risk          15.0          11.0                      . Sodium 07/29/2013 138  135 - 145 mEq/L Final  . Potassium 07/29/2013 3.0* 3.5 - 5.1 mEq/L Final  . Chloride 07/29/2013 102  96 - 112 mEq/L Final  . CO2 07/29/2013 29  19 - 32 mEq/L Final  . Glucose, Bld 07/29/2013 108* 70 - 99 mg/dL Final  . BUN 09/81/1914 19  6 - 23 mg/dL Final  . Creatinine, Ser 07/29/2013 0.6  0.4 - 1.2 mg/dL Final  . Total  Bilirubin 07/29/2013 0.6  0.3 - 1.2 mg/dL Final  . Alkaline Phosphatase 07/29/2013 90  39 - 117 U/L Final  . AST 07/29/2013 20  0 - 37 U/L Final  . ALT 07/29/2013 19  0 - 35 U/L Final  . Total Protein 07/29/2013 7.6  6.0 - 8.3 g/dL Final  . Albumin 78/29/5621 4.0  3.5 - 5.2 g/dL Final  . Calcium 30/86/5784 9.3  8.4 - 10.5 mg/dL Final  . GFR 69/62/9528 100.23  >60.00 mL/min Final  . Fructosamine 07/29/2013 268  <285 umol/L Final   Comment:                            Variations in levels of serum proteins (albumin and immunoglobulins)                          may affect fructosamine results.                                 Medication List       This list is accurate as of: 08/03/13 10:35 AM.  Always use your most recent med list.               atorvastatin 40 MG tablet  Commonly known as:  LIPITOR  Take 1 tablet (40 mg total) by mouth daily.     buPROPion 300 MG 24 hr tablet  Commonly known as:  WELLBUTRIN XL  Take 300 mg by mouth daily.     Canagliflozin-Metformin HCl [phone] MG Tabs  Take 150-1,000 mg by mouth 2 (two) times daily.     carvedilol 6.25 MG tablet  Commonly known as:  COREG  Take 6.25 mg by mouth 2 (two) times daily with a meal.     CVS Lancets Ultra Thin Misc     EPIPEN 0.3 mg/0.3 mL Devi  Generic drug:  EPINEPHrine  Inject 0.3 mg into the muscle as needed. For allergic reactions     furosemide 20 MG tablet  Commonly known as:  LASIX  Take 20 mg by mouth daily.     glucose blood test strip  Commonly known as:  BAYER CONTOUR NEXT TEST  Use to check blood sugars twice a day dx code 250.00     Insulin Pen Needle 31G X 5 MM Misc  Commonly known as:  B-D UF III MINI PEN NEEDLES  Use 5 pen needles per day as instructed     irbesartan 150 MG tablet  Commonly known as:  AVAPRO  Take 1 tablet (150 mg total) by mouth daily.     irbesartan 300 MG tablet  Commonly known as:  AVAPRO  Take 300 mg by mouth daily.     LEVEMIR FLEXPEN 100 UNIT/ML  injection  Generic drug:  insulin detemir  Inject 16-22 Units into the skin 2 (two) times daily before lunch and supper. Takes 22 units every morning and 16 units every evening     Liraglutide 18 MG/3ML Sopn  Commonly known as:  VICTOZA  Inject 1.2 mg into the skin daily.     montelukast 10 MG tablet  Commonly known as:  SINGULAIR  Take 10 mg by mouth at bedtime.     naproxen 500 MG tablet  Commonly known as:  NAPROSYN  Take 500 mg by mouth 2 (two) times daily with a meal.     NOVOLOG FLEXPEN 100 UNIT/ML injection  Generic drug:  insulin aspart  Inject 16 Units into the skin 2 (two) times daily before lunch and supper.     ondansetron 4 MG tablet  Commonly known as:  ZOFRAN  Take 1 tablet (4 mg total) by mouth every 6 (six) hours.     oxybutynin 5 MG 24 hr tablet  Commonly known as:  DITROPAN-XL  Take 5 mg by mouth daily.     PROCTOFOAM HC rectal foam  Generic drug:  hydrocortisone-pramoxine        Allergies: No Known Allergies  Past Medical History  Diagnosis Date  . Diabetes mellitus     a. Dx > 5 y ago  . Hypertension   . Hypercholesteremia   . Arthritis   . Cardiomyopathy     a. 05/2012 Echo: EF 30-35%, Gr 1 DD, mild LVH, mild MR, pericardial effusion  . Pericardial effusion     a. 05/2012 Echo: circumferential pericardial effusion, mostly 10mm, largest pocket posterior - 23mm, mild RA indentation, no RV collapse, no tamponade.    Past Surgical History  Procedure Laterality Date  . Abdominal hysterectomy    . Foot surgery      Family History  Problem Relation Age of Onset  . Other      No history of CAD  . Tuberculosis Father     died when pt was young.    Social History:  reports that she has quit smoking. She does not have any smokeless tobacco history on file. She reports that she does not drink alcohol or use illicit drugs.  Review of Systems   Lipids: LDL in the last visit was  141.  It was not clear if she is taking her simvastatin regularly.  LDL is now 63, likely to be from improved compliance   Hypertension: Has fair control, blood pressure relatively high today, usually followed by PCP   No history of numbness in her feet   Examination:   BP 138/90  Pulse 58  Temp(Src) 98.5 F (36.9 C)  Resp 12  Ht 5\' 3"  (1.6 m)  Wt 179 lb (81.194 kg)  BMI  31.72 kg/m2  SpO2 97%  Body mass index is 31.72 kg/(m^2).   Assesment:   Diabetes type 2, uncontrolled - 250.02 Her glucose levels are significantly better probably from adding Invokana Also is much more compliant with checking her blood sugars and may be watching her diet based on this Fructosamine indicates good control She is getting unusually low blood sugars after breakfast and supper periodically and she does not know after walking up meals However her weight has not come down with Invokana She has good compliance with her insulin regimen before meals and Levemir twice a day   Plan:  She will reduce her Humalog by at least 2 units to avoid hypoglycemia Discussed balanced meals with some protein at each meal May check blood sugars at different times of the day and call if unusually high or low She will continue Invokana/metformin combination    Talik Casique 08/03/2013, 10:35 AM

## 2013-08-03 NOTE — Patient Instructions (Addendum)
NOVOLOG pen 14  Before meals  Check 1/2 readings before eating  Stop Furosemide  Take potassium for 15 days only

## 2013-08-16 DIAGNOSIS — I428 Other cardiomyopathies: Secondary | ICD-10-CM | POA: Diagnosis not present

## 2013-08-23 ENCOUNTER — Other Ambulatory Visit: Payer: Self-pay | Admitting: *Deleted

## 2013-08-23 MED ORDER — CVS LANCETS ULTRA THIN 30G MISC: Status: DC

## 2013-08-24 DIAGNOSIS — I447 Left bundle-branch block, unspecified: Secondary | ICD-10-CM | POA: Diagnosis not present

## 2013-08-24 DIAGNOSIS — I10 Essential (primary) hypertension: Secondary | ICD-10-CM | POA: Diagnosis not present

## 2013-08-24 DIAGNOSIS — I509 Heart failure, unspecified: Secondary | ICD-10-CM | POA: Diagnosis not present

## 2013-08-24 DIAGNOSIS — I5042 Chronic combined systolic (congestive) and diastolic (congestive) heart failure: Secondary | ICD-10-CM | POA: Diagnosis not present

## 2013-08-26 ENCOUNTER — Telehealth: Payer: Self-pay | Admitting: Internal Medicine

## 2013-08-26 ENCOUNTER — Encounter: Payer: Self-pay | Admitting: Internal Medicine

## 2013-08-26 ENCOUNTER — Ambulatory Visit (INDEPENDENT_AMBULATORY_CARE_PROVIDER_SITE_OTHER): Payer: Federal, State, Local not specified - PPO | Admitting: Internal Medicine

## 2013-08-26 ENCOUNTER — Encounter (INDEPENDENT_AMBULATORY_CARE_PROVIDER_SITE_OTHER): Payer: Self-pay

## 2013-08-26 VITALS — BP 128/75 | HR 69 | Ht 62.5 in | Wt 177.0 lb

## 2013-08-26 DIAGNOSIS — I5022 Chronic systolic (congestive) heart failure: Secondary | ICD-10-CM

## 2013-08-26 DIAGNOSIS — I428 Other cardiomyopathies: Secondary | ICD-10-CM

## 2013-08-26 DIAGNOSIS — I5042 Chronic combined systolic (congestive) and diastolic (congestive) heart failure: Secondary | ICD-10-CM

## 2013-08-26 DIAGNOSIS — I447 Left bundle-branch block, unspecified: Secondary | ICD-10-CM | POA: Insufficient documentation

## 2013-08-26 DIAGNOSIS — I509 Heart failure, unspecified: Secondary | ICD-10-CM

## 2013-08-26 HISTORY — DX: Chronic systolic (congestive) heart failure: I50.22

## 2013-08-26 NOTE — Assessment & Plan Note (Signed)
As above.

## 2013-08-26 NOTE — Assessment & Plan Note (Signed)
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 She is a broad left bundle branch block. She is appropriately considered for CRT-D implantation. We utilized an Equities trader. We reviewed the potential benefits as well as the potential risks including but not limited to death perforation diaphragmatic stimulation. She understands and would like to return to discuss this with her husband

## 2013-08-26 NOTE — Telephone Encounter (Signed)
New Problem:  Pt's husband is asking if he can get FMLA from his job when his wife has pacemaker surgery. Pt's husband would like a call back.

## 2013-08-26 NOTE — Assessment & Plan Note (Signed)
Euvolemic. We'll continue her on current medications

## 2013-08-26 NOTE — Progress Notes (Signed)
Patient Care Team: Renaye Rakers, MD as PCP - General (Family Medicine)   HPI  Linda Lowe is a 62 y.o. female Seen a year ago by [facility] and found to have cardiomyopathy-nonischemic cath 9/13  Rx with carvedilol and irbesartan She is socially been seen by Dr. CV   Most recent echocardiogram 12/14 demonstrated persistent left ventricular dysfunction with an EF of 25-35%. She is also noted to have left bundle branch block.  She has dyspnea on exertion about 100-150 feet. She does not carry groceries. She denies edema, PND or syncope.  She has hx of DM HTN  Past Medical History  Diagnosis Date  . Diabetes mellitus     a. Dx > 5 y ago  . Hypertension   . Hypercholesteremia   . Arthritis   . Cardiomyopathy     a. 05/2012 Echo: EF 30-35%, Gr 1 DD, mild LVH, mild MR, pericardial effusion  . Pericardial effusion     a. 05/2012 Echo: circumferential pericardial effusion, mostly 10mm, largest pocket posterior - 23mm, mild RA indentation, no RV collapse, no tamponade.    Past Surgical History  Procedure Laterality Date  . Abdominal hysterectomy    . Foot surgery      Current Outpatient Prescriptions  Medication Sig Dispense Refill  . atorvastatin (LIPITOR) 40 MG tablet Take 1 tablet (40 mg total) by mouth daily.  30 tablet  5  . buPROPion (WELLBUTRIN XL) 300 MG 24 hr tablet Take 300 mg by mouth daily.      . Canagliflozin-Metformin HCl [phone] MG TABS Take 150-1,000 mg by mouth 2 (two) times daily.  60 tablet  5  . carvedilol (COREG) 6.25 MG tablet Take 6.25 mg by mouth 2 (two) times daily with a meal.      . CVS Lancets Ultra Thin MISC Use as directed 5 times per day  150 each  5  . EPINEPHrine (EPIPEN) 0.3 mg/0.3 mL DEVI Inject 0.3 mg into the muscle as needed. For allergic reactions      . furosemide (LASIX) 20 MG tablet Take 20 mg by mouth daily.      Marland Kitchen glucose blood (BAYER CONTOUR NEXT TEST) test strip Use to check blood sugars twice a day dx code 250.00  100 each  5  .  insulin aspart (NOVOLOG FLEXPEN) 100 UNIT/ML injection Inject 16 Units into the skin 2 (two) times daily before lunch and supper.       . insulin detemir (LEVEMIR FLEXPEN) 100 UNIT/ML injection Inject 16-22 Units into the skin 2 (two) times daily before lunch and supper. Takes 22 units every morning and 16 units every evening      . Insulin Pen Needle (B-D UF III MINI PEN NEEDLES) 31G X 5 MM MISC Use 5 pen needles per day as instructed  150 each  11  . irbesartan (AVAPRO) 300 MG tablet Take 300 mg by mouth daily.      . Liraglutide (VICTOZA) 18 MG/3ML SOPN Inject 1.2 mg into the skin daily.  2 pen  5  . montelukast (SINGULAIR) 10 MG tablet 10 mg daily.      . naproxen (NAPROSYN) 500 MG tablet Take 500 mg by mouth 2 (two) times daily with a meal.      . ondansetron (ZOFRAN) 4 MG tablet Take 1 tablet (4 mg total) by mouth every 6 (six) hours.  12 tablet  0  . oxybutynin (DITROPAN-XL) 5 MG 24 hr tablet Take 5 mg by mouth  daily.      . potassium chloride SA (K-DUR,KLOR-CON) 20 MEQ tablet Take 1 tablet (20 mEq total) by mouth daily.  30 tablet  3  . irbesartan (AVAPRO) 150 MG tablet Take 1 tablet (150 mg total) by mouth daily.  30 tablet  0   No current facility-administered medications for this visit.    No Known Allergies  Review of Systems negative except from HPI and PMH  Physical Exam BP 128/75  Pulse 69  Ht 5' 2.5" (1.588 m)  Wt 177 lb (80.287 kg)  BMI 31.84 kg/m2 Well developed and well nourished in no acute distress HENT normal E scleral and icterus clear Neck Supple JVP flat; carotids brisk and full Clear to ausculation  Regular rate and rhythm, no murmurs gallops or rub Soft with active bowel sounds No clubbing cyanosis none Edema Alert and oriented, grossly normal motor and sensory function Skin Warm and Dry Affect engaging Lymph nodes negative ECG demonstrates sinus rhythm at 69 intervals 17/16/47 Left bundle branch block     Assessment and  Plan

## 2013-08-26 NOTE — Patient Instructions (Signed)
Your physician recommends that you continue on your current medications as directed. Please refer to the Current Medication list given to you today.  Your physician recommends that you schedule a follow-up appointment on a Friday with Dr. Graciela Husbands.

## 2013-08-27 NOTE — Telephone Encounter (Addendum)
Per Dr. Graciela Husbands - ok for him to take FMLA when his wife has surgery. Pt husband verbalized understanding. They will call soon to make f/u appt

## 2013-08-30 ENCOUNTER — Telehealth: Payer: Self-pay | Admitting: Internal Medicine

## 2013-08-30 NOTE — Telephone Encounter (Signed)
Pt and her husband came by office to see Dr. Graciela Husbands - I explained that they needed to schedule OV. Pt husband states that he can do any day after 3pm, when he gets off work. They also asked about husband's FMLA papers - I explained that we could not fill these out until we had a surgery date (and we can't make surgery date until she comes in with her husband for OV). Pt husband states that now he understands, and that he didn't before. I will discuss with Dr. Graciela Husbands squeezing this pt in on a 4:15 slot. I will call them with day/time. They are agreeable to plan.

## 2013-08-30 NOTE — Telephone Encounter (Signed)
Follow Up  States that she was called and someone hung up on her// Please return call

## 2013-08-30 NOTE — Telephone Encounter (Signed)
New Message  Pt called states that the OV for Thursday at 4:15 is no good.. She says that she will come in this afternoon to drop FMLA forms so that her husband can get approval to be with her before she schedules the appt//

## 2013-08-30 NOTE — Telephone Encounter (Signed)
New Problem:  Pt is calling in asking about an appt for surgery. Pt is requesting a call back.

## 2013-08-30 NOTE — Telephone Encounter (Signed)
Discussed maybe Thursday afternoon at 4:15 for OV. Pt will talk with her husband and call me back.

## 2013-08-31 ENCOUNTER — Encounter: Payer: Self-pay | Admitting: Internal Medicine

## 2013-08-31 NOTE — Telephone Encounter (Signed)
appt made 09/21/12 at 4:15

## 2013-09-14 ENCOUNTER — Encounter: Payer: Self-pay | Admitting: *Deleted

## 2013-09-21 ENCOUNTER — Encounter: Payer: Self-pay | Admitting: Internal Medicine

## 2013-09-21 ENCOUNTER — Encounter: Payer: Self-pay | Admitting: *Deleted

## 2013-09-21 ENCOUNTER — Ambulatory Visit (INDEPENDENT_AMBULATORY_CARE_PROVIDER_SITE_OTHER): Payer: Federal, State, Local not specified - PPO | Admitting: Internal Medicine

## 2013-09-21 VITALS — BP 131/75 | HR 59 | Ht 63.5 in | Wt 176.8 lb

## 2013-09-21 DIAGNOSIS — I428 Other cardiomyopathies: Secondary | ICD-10-CM

## 2013-09-21 DIAGNOSIS — I447 Left bundle-branch block, unspecified: Secondary | ICD-10-CM

## 2013-09-21 DIAGNOSIS — I5022 Chronic systolic (congestive) heart failure: Secondary | ICD-10-CM | POA: Diagnosis not present

## 2013-09-21 NOTE — Progress Notes (Signed)
Patient Care Team: Renaye Rakers, MD as PCP - General (Family Medicine)   HPI  Linda Lowe is a 63 y.o. female Seen with her husband today following a meeting last month they consider CRT D. 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   Past Medical History  Diagnosis Date  . Diabetes mellitus     a. Dx > 5 y ago  . Hypertension   . Hypercholesteremia   . Arthritis   . Cardiomyopathy     a. 05/2012 Echo: EF 30-35%, Gr 1 DD, mild LVH, mild MR, pericardial effusion  . Pericardial effusion     a. 05/2012 Echo: circumferential pericardial effusion, mostly 49mm, largest pocket posterior - 45mm, mild RA indentation, no RV collapse, no tamponade.    Past Surgical History  Procedure Laterality Date  . Abdominal hysterectomy    . Foot surgery      Current Outpatient Prescriptions  Medication Sig Dispense Refill  . atorvastatin (LIPITOR) 40 MG tablet Take 1 tablet (40 mg total) by mouth daily.  30 tablet  5  . buPROPion (WELLBUTRIN XL) 300 MG 24 hr tablet Take 300 mg by mouth daily.      . Canagliflozin-Metformin HCl [phone] MG TABS Take 150-1,000 mg by mouth 2 (two) times daily.  60 tablet  5  . carvedilol (COREG) 6.25 MG tablet Take 6.25 mg by mouth 2 (two) times daily with a meal.      . [employer] Lancets Ultra Thin MISC Use as directed 5 times per day  150 each  5  . EPINEPHrine (EPIPEN) 0.3 mg/0.3 mL DEVI Inject 0.3 mg into the muscle as needed. For allergic reactions      . furosemide (LASIX) 20 MG tablet Take 20 mg by mouth daily.      Marland Kitchen glucose blood (BAYER CONTOUR NEXT TEST) test strip Use to check blood sugars twice a day dx code 250.00  100 each  5  . insulin aspart (NOVOLOG FLEXPEN) 100 UNIT/ML injection Inject 16 Units into the skin 2 (two) times daily before lunch and supper.       . insulin detemir (LEVEMIR FLEXPEN) 100 UNIT/ML injection Inject 16-22 Units into the skin 2 (two) times daily before lunch and supper. Takes 22 units every  morning and 16 units every evening      . Insulin Pen Needle (B-D UF III MINI PEN NEEDLES) 31G X 5 MM MISC Use 5 pen needles per day as instructed  150 each  11  . irbesartan (AVAPRO) 300 MG tablet Take 300 mg by mouth daily.      . Liraglutide (VICTOZA) 18 MG/3ML SOPN Inject 1.2 mg into the skin daily.  2 pen  5  . montelukast (SINGULAIR) 10 MG tablet 10 mg daily.      . naproxen (NAPROSYN) 500 MG tablet Take 500 mg by mouth 2 (two) times daily with a meal.      . ondansetron (ZOFRAN) 4 MG tablet Take 1 tablet (4 mg total) by mouth every 6 (six) hours.  12 tablet  0  . oxybutynin (DITROPAN-XL) 5 MG 24 hr tablet Take 5 mg by mouth daily.      . potassium chloride SA (K-DUR,KLOR-CON) 20 MEQ tablet Take 1 tablet (20 mEq total) by mouth daily.  30 tablet  3  . irbesartan (AVAPRO) 150 MG tablet Take 1 tablet (150 mg total) by mouth daily.  30 tablet  0   No current facility-administered medications  for this visit.    No Known Allergies  Review of Systems negative except from HPI and PMH  Physical Exam BP 131/75  Pulse 59  Ht 5' 3.5" (1.613 m)  Wt 176 lb 12.8 oz (80.196 kg)  BMI 30.82 kg/m2 Well developed and nourished in no acute distress HENT normal Neck supple with JVP-flat Clear Regular rate and rhythm, no murmurs or gallops Abd-soft with active BS No Clubbing cyanosis edema Skin-warm and dry A & Oriented  Grossly normal sensory and motor function     Assessment and  Plan

## 2013-09-21 NOTE — Assessment & Plan Note (Signed)
She and her husband would like to proceed with CRT-D implantation. We have reviewed the potential benefits including reduction risk of sudden death, improved heart failure symptoms, and a lower likelihood of deteriorating functional status as well as potential risks including but not limited to death, perforation of heart lung, inappropriate therapy, infection.

## 2013-09-21 NOTE — Patient Instructions (Signed)
You are scheduled for CRT-D implant on 10/15/2013 with Dr. Graciela Husbands. Please be at the hospital at 5:30am  Pre-procedure lab work 10/08/2013

## 2013-09-23 ENCOUNTER — Telehealth: Payer: Self-pay | Admitting: *Deleted

## 2013-09-23 NOTE — Telephone Encounter (Signed)
Asked pt to have her husband call me to discuss missing FMLA paperwork he needed to have filled out for wife's upcoming surgery. She will have him call office.

## 2013-09-23 NOTE — Telephone Encounter (Signed)
Husband returned my call. They will drop off paperwork this afternoon.

## 2013-09-28 ENCOUNTER — Other Ambulatory Visit: Payer: Federal, State, Local not specified - PPO

## 2013-10-01 ENCOUNTER — Encounter (HOSPITAL_COMMUNITY): Payer: Self-pay | Admitting: Pharmacy Technician

## 2013-10-05 ENCOUNTER — Other Ambulatory Visit: Payer: Federal, State, Local not specified - PPO

## 2013-10-06 ENCOUNTER — Telehealth: Payer: Self-pay | Admitting: Endocrinology

## 2013-10-06 ENCOUNTER — Other Ambulatory Visit: Payer: Self-pay | Admitting: *Deleted

## 2013-10-06 ENCOUNTER — Other Ambulatory Visit (INDEPENDENT_AMBULATORY_CARE_PROVIDER_SITE_OTHER): Payer: Federal, State, Local not specified - PPO

## 2013-10-06 ENCOUNTER — Telehealth: Payer: Self-pay | Admitting: *Deleted

## 2013-10-06 DIAGNOSIS — IMO0001 Reserved for inherently not codable concepts without codable children: Secondary | ICD-10-CM

## 2013-10-06 DIAGNOSIS — E1165 Type 2 diabetes mellitus with hyperglycemia: Principal | ICD-10-CM

## 2013-10-06 LAB — BASIC METABOLIC PANEL
BUN: 15 mg/dL (ref 6–23)
CO2: 30 mEq/L (ref 19–32)
Calcium: 9.6 mg/dL (ref 8.4–10.5)
Chloride: 101 mEq/L (ref 96–112)
Creatinine, Ser: 0.7 mg/dL (ref 0.4–1.2)
GFR: 84.72 mL/min (ref >60.00)
Glucose, Bld: 49 mg/dL — CL (ref 70–99)
Potassium: 4.2 mEq/L (ref 3.5–5.1)
Sodium: 139 mEq/L (ref 135–145)

## 2013-10-06 LAB — HEMOGLOBIN A1C: Hgb A1c MFr Bld: 7 % — ABNORMAL HIGH (ref 4.6–6.5)

## 2013-10-06 MED ORDER — LIRAGLUTIDE 18 MG/3ML ~~LOC~~ SOPN: 1.2000 mg | PEN_INJECTOR | Freq: Every day | SUBCUTANEOUS | Status: DC

## 2013-10-06 NOTE — Telephone Encounter (Signed)
noted 

## 2013-10-06 NOTE — Telephone Encounter (Signed)
Pt. Answered the phone and was not aware that her blood sugar this AM at 10:30 was low (49mg .).  She reported that she took her Levemir (22u) and her Novolog (16u), and Victoza before leaving the house this AM.  She did not eat her breakfast.       Her blood sugar is now 98, 4 hr.pcL today. She was told that she should not take her Novolog insulin if she is not eating her meal.  She was told that she can take her Levemir and Victoza, but to wait until she gets home and right before eating breakfast before taking her Novolog.    She reported good understanding of this and agreed to do this.

## 2013-10-06 NOTE — Telephone Encounter (Signed)
Advised husband FMLA paperwork ready and left at front desk for pick up. He verbalized understanding and agreeable to plan.

## 2013-10-08 ENCOUNTER — Other Ambulatory Visit (INDEPENDENT_AMBULATORY_CARE_PROVIDER_SITE_OTHER): Payer: Federal, State, Local not specified - PPO

## 2013-10-08 ENCOUNTER — Encounter: Payer: Self-pay | Admitting: Endocrinology

## 2013-10-08 ENCOUNTER — Ambulatory Visit (INDEPENDENT_AMBULATORY_CARE_PROVIDER_SITE_OTHER): Payer: Federal, State, Local not specified - PPO | Admitting: Endocrinology

## 2013-10-08 VITALS — BP 142/74 | HR 75 | Temp 98.3°F | Resp 14 | Ht 63.0 in | Wt 175.3 lb

## 2013-10-08 DIAGNOSIS — I428 Other cardiomyopathies: Secondary | ICD-10-CM | POA: Diagnosis not present

## 2013-10-08 DIAGNOSIS — IMO0001 Reserved for inherently not codable concepts without codable children: Secondary | ICD-10-CM

## 2013-10-08 DIAGNOSIS — I447 Left bundle-branch block, unspecified: Secondary | ICD-10-CM

## 2013-10-08 DIAGNOSIS — I5022 Chronic systolic (congestive) heart failure: Secondary | ICD-10-CM

## 2013-10-08 DIAGNOSIS — E1165 Type 2 diabetes mellitus with hyperglycemia: Principal | ICD-10-CM

## 2013-10-08 LAB — CBC WITH DIFFERENTIAL/PLATELET
Basophils Absolute: 0 10*3/uL (ref 0.0–0.1)
Basophils Relative: 0.4 % (ref 0.0–3.0)
Eosinophils Absolute: 0.5 10*3/uL (ref 0.0–0.7)
Eosinophils Relative: 5.9 % — ABNORMAL HIGH (ref 0.0–5.0)
HCT: 39.3 % (ref 36.0–46.0)
Hemoglobin: 12.9 g/dL (ref 12.0–15.0)
Lymphocytes Relative: 29.9 % (ref 12.0–46.0)
Lymphs Abs: 2.6 10*3/uL (ref 0.7–4.0)
MCHC: 32.8 g/dL (ref 30.0–36.0)
MCV: 87.7 fl (ref 78.0–100.0)
Monocytes Absolute: 0.7 10*3/uL (ref 0.1–1.0)
Monocytes Relative: 8.2 % (ref 3.0–12.0)
Neutro Abs: 4.9 10*3/uL (ref 1.4–7.7)
Neutrophils Relative %: 55.6 % (ref 43.0–77.0)
Platelets: 365 10*3/uL (ref 150.0–400.0)
RBC: 4.48 Mil/uL (ref 3.87–5.11)
RDW: 14.2 % (ref 11.5–14.6)
WBC: 8.9 10*3/uL (ref 4.5–10.5)

## 2013-10-08 LAB — BASIC METABOLIC PANEL
BUN: 12 mg/dL (ref 6–23)
CO2: 28 mEq/L (ref 19–32)
Calcium: 9.4 mg/dL (ref 8.4–10.5)
Chloride: 102 mEq/L (ref 96–112)
Creatinine, Ser: 0.6 mg/dL (ref 0.4–1.2)
GFR: 102.01 mL/min (ref >60.00)
Glucose, Bld: 153 mg/dL — ABNORMAL HIGH (ref 70–99)
Potassium: 3.6 mEq/L (ref 3.5–5.1)
Sodium: 139 mEq/L (ref 135–145)

## 2013-10-08 NOTE — Patient Instructions (Signed)
Please check blood sugars at least half the time about 2 hours after any meal  (1-2 pm and 9-10 pm) and every 2 days on waking up.   Please bring blood sugar monitor to each visit

## 2013-10-08 NOTE — Progress Notes (Signed)
Patient ID: Linda Lowe, female   DOB: 04/15/1951, 63 y.o.   MRN: [id number]  Reason for Appointment: Diabetes follow-up   History of Present Illness   Diagnosis: date of diagnosis: 3-4 years ago.   PAST history: Her diabetes has been usually poorly controlled. She has been on multiple medications including Victoza and basal bolus regimen also. However she  had difficulty with consistent compliance with insulin, diet and exercise regimen. With starting Victoza her blood sugars appeared to be improved. After repeated sessions with educators she was able to improve her compliance level and blood sugars had been improving since about 10/13. Insulin doses have been increased overall  A1c previously had been usually over 8% and below this for the first time in 2/14.  RECENT history:  She had been switched to a combination of Invokana and metformin instead of Janumet in late 2014 She does appear to have excellent blood sugars at home as on the last visit although is checking them only before breakfast now despite discussion on timing of glucose monitoring A1c is at target of 7% for the first time  Although she has not had any hypoglycemia at home she appears to have only minimal symptoms in her blood sugars are low. She had a glucose of 49 in the lab because of feeding for about 2 hours after her morning NovoLog. Her only symptoms that day were a little crawling sensation in her arms She thinks she is watching her diet better and taking her insulin as directed now Also taking Victoza as directed  Current insulin regimen: Levemir 22 units AT 9 AM AND 16 at bedtime. NOVOLOG pen 16 bid a.c.  Victoza 1.2 mg daily at lunch.   Glucometer: Contour.  Blood Glucose readings: Fasting 71-138 with average 117, nonfasting 98  DIET: Usually small portions, may be getting more carbohydrates like rice sometimes Meals: 2 meals per day usually at 11 a.m. and 7-8 p.m.  Physical activity: exercise: walks 10-15 min  daily.  Certified Diabetes Educator visit: Most recent:, 12/12.  Dietician visit: Most recent:, 12/12.   Retinal exam: Most recent:5/12.  Pneumococcal immunization: 12/12.   Wt Readings from Last 3 Encounters:  10/08/13 175 lb 4.8 oz (79.516 kg)  09/21/13 176 lb 12.8 oz (80.196 kg)  08/26/13 177 lb (80.287 kg)   LABS:  Lab Results  Component Value Date   HGBA1C 7.0* 10/06/2013   HGBA1C 9.4* 06/09/2013   HGBA1C 9.3* 05/23/2012   Lab Results  Component Value Date   MICROALBUR 3.2* 06/09/2013   LDLCALC 63 07/29/2013   CREATININE 0.7 10/06/2013     Appointment on 10/06/2013  Component Date Value Range Status  . Hemoglobin A1C 10/06/2013 7.0* 4.6 - 6.5 % Final   Glycemic Control Guidelines for People with Diabetes:Non Diabetic:  <6%Goal of Therapy: <7%Additional Action Suggested:  >8%   . Sodium 10/06/2013 139  135 - 145 mEq/L Final  . Potassium 10/06/2013 4.2  3.5 - 5.1 mEq/L Final  . Chloride 10/06/2013 101  96 - 112 mEq/L Final  . CO2 10/06/2013 30  19 - 32 mEq/L Final  . Glucose, Bld 10/06/2013 49* 70 - 99 mg/dL Final  . BUN 47/42/5956 15  6 - 23 mg/dL Final  . Creatinine, Ser 10/06/2013 0.7  0.4 - 1.2 mg/dL Final  . Calcium 38/75/6433 9.6  8.4 - 10.5 mg/dL Final  . GFR 29/51/8841 84.72  >60.00 mL/min Final      Medication List       This  list is accurate as of: 10/08/13 10:32 AM.  Always use your most recent med list.               atorvastatin 40 MG tablet  Commonly known as:  LIPITOR  Take 1 tablet (40 mg total) by mouth daily.     BAYER CONTOUR NEXT TEST test strip  Generic drug:  glucose blood     buPROPion 300 MG 24 hr tablet  Commonly known as:  WELLBUTRIN XL  Take 300 mg by mouth daily.     Canagliflozin-Metformin HCl [phone] MG Tabs  Take 150-1,000 mg by mouth 2 (two) times daily.     carvedilol 6.25 MG tablet  Commonly known as:  COREG  Take 6.25 mg by mouth 2 (two) times daily with a meal.     EPIPEN 0.3 mg/0.3 mL Devi  Generic drug:   EPINEPHrine  Inject 0.3 mg into the muscle as needed. For allergic reactions     furosemide 20 MG tablet  Commonly known as:  LASIX  Take 20 mg by mouth daily.     Insulin Pen Needle 31G X 5 MM Misc  Commonly known as:  B-D UF III MINI PEN NEEDLES  Use 5 pen needles per day as instructed     irbesartan 150 MG tablet  Commonly known as:  AVAPRO  Take 1 tablet (150 mg total) by mouth daily.     irbesartan 300 MG tablet  Commonly known as:  AVAPRO  Take 300 mg by mouth daily.     LEVEMIR FLEXPEN 100 UNIT/ML injection  Generic drug:  insulin detemir  Inject 16-22 Units into the skin 2 (two) times daily before lunch and supper. Takes 22 units every morning and 16 units every evening     Liraglutide 18 MG/3ML Sopn  Commonly known as:  VICTOZA  Inject 1.2 mg into the skin daily.     naproxen 500 MG tablet  Commonly known as:  NAPROSYN  Take 500 mg by mouth 2 (two) times daily with a meal.     NOVOLOG FLEXPEN 100 UNIT/ML injection  Generic drug:  insulin aspart  Inject 16 Units into the skin 2 (two) times daily before lunch and supper.     oxybutynin 5 MG 24 hr tablet  Commonly known as:  DITROPAN-XL  Take 5 mg by mouth daily.     potassium chloride SA 20 MEQ tablet  Commonly known as:  K-DUR,KLOR-CON  Take 1 tablet (20 mEq total) by mouth daily.        Allergies: No Known Allergies  Past Medical History  Diagnosis Date  . Diabetes mellitus     a. Dx > 5 y ago  . Hypertension   . Hypercholesteremia   . Arthritis   . Cardiomyopathy     a. 05/2012 Echo: EF 30-35%, Gr 1 DD, mild LVH, mild MR, pericardial effusion  . Pericardial effusion     a. 05/2012 Echo: circumferential pericardial effusion, mostly 36mm, largest pocket posterior - 67mm, mild RA indentation, no RV collapse, no tamponade.    Past Surgical History  Procedure Laterality Date  . Abdominal hysterectomy    . Foot surgery      Family History  Problem Relation Age of Onset  . Other      No history  of CAD  . Tuberculosis Father     died when pt was young.    Social History:  reports that she has quit smoking. She does not have any smokeless  tobacco history on file. She reports that she does not drink alcohol or use illicit drugs.  Review of Systems   Lipids: LDL on the last visit was  63, likely to be from improved compliance   Hypertension: Has fair control, blood pressure relatively high today, usually followed by PCP   No history of numbness in her feet   Examination:   BP 142/74  Pulse 75  Temp(Src) 98.3 F (36.8 C)  Resp 14  Ht 5\' 3"  (1.6 m)  Wt 175 lb 4.8 oz (79.516 kg)  BMI 31.06 kg/m2  SpO2 95%  Body mass index is 31.06 kg/(m^2).   Assesment:   Diabetes type 2, uncontrolled  Her glucose levels are significantly better since adding Invokana and her A1c is 7% for the first time She has good compliance with her insulin regimen before meals and Levemir twice a day Her fasting readings are fairly consistent but she has not done any readings after meals No reported hypoglycemia but she appears to have only minimal symptoms with a glucose of 49 in the lab  Plan:  No change in insulin Discussed balanced meals with some protein at each meal May check blood sugars at different times of the day and call if unusually high or low She will continue Invokana/metformin combination    Madysyn Hanken 10/08/2013, 10:32 AM

## 2013-10-11 ENCOUNTER — Telehealth: Payer: Self-pay | Admitting: Internal Medicine

## 2013-10-11 NOTE — Telephone Encounter (Signed)
New problem:  Pt states her mom passed away and she needs to reschedule her surgery w/ Dr. Graciela Husbands.

## 2013-10-11 NOTE — Telephone Encounter (Signed)
Pt tells me her mother passed away yesterday and she will be leaving tomorrow, plan on being gone for a couple weeks possibly. Agreement that I will touch base middle of February to try and reschedule, and she will call when she gets back from handling family matters. I will pass information along to Dr. Graciela Husbands.

## 2013-10-15 ENCOUNTER — Encounter (HOSPITAL_COMMUNITY): Admission: RE | Payer: Self-pay | Source: Ambulatory Visit

## 2013-10-15 ENCOUNTER — Ambulatory Visit (HOSPITAL_COMMUNITY)
Admission: RE | Admit: 2013-10-15 | Payer: Federal, State, Local not specified - PPO | Source: Ambulatory Visit | Admitting: Internal Medicine

## 2013-10-15 SURGERY — BI-VENTRICULAR IMPLANTABLE CARDIOVERTER DEFIBRILLATOR  (CRT-D)
Anesthesia: Monitor Anesthesia Care

## 2013-10-29 ENCOUNTER — Telehealth: Payer: Self-pay | Admitting: Internal Medicine

## 2013-10-29 NOTE — Telephone Encounter (Signed)
Patient ready to reschedule her pacemaker surgery.  Informed her that Dr Odessa Fleming nurse  is out of the office today and i will pass this on to her.  Informed patient she should get a call early next week.  Pt verbalizes understanding and agreement.

## 2013-10-29 NOTE — Telephone Encounter (Signed)
New problem   Pt want to r/s her pacemaker surgery. Please call pt.

## 2013-11-05 NOTE — Telephone Encounter (Signed)
Called pt to schedule CRT-D - need to verify if anesthesia needed for procedure - pt and I will touch base by beginning of next week to schedule after reviewing with Dr. Graciela Husbands. Patient verbalized understanding and agreeable to plan.

## 2013-11-09 ENCOUNTER — Other Ambulatory Visit: Payer: Self-pay | Admitting: *Deleted

## 2013-11-09 ENCOUNTER — Encounter: Payer: Self-pay | Admitting: *Deleted

## 2013-11-09 DIAGNOSIS — I428 Other cardiomyopathies: Secondary | ICD-10-CM

## 2013-11-09 DIAGNOSIS — Z01812 Encounter for preprocedural laboratory examination: Secondary | ICD-10-CM

## 2013-11-09 NOTE — Telephone Encounter (Signed)
Called pt to schedule CRT-D implant for 12/01/13 at 12:30 w/ Dr. Graciela Husbands. Pt to be at hospital at 10:30am. Pre procedure labs 11/26/13. Letter of instructions reviewed with pt and left at front desk for pick up. Patient verbalized understanding and agreeable to plan.

## 2013-11-10 ENCOUNTER — Other Ambulatory Visit: Payer: Self-pay | Admitting: Internal Medicine

## 2013-11-16 ENCOUNTER — Telehealth: Payer: Self-pay | Admitting: Internal Medicine

## 2013-11-16 NOTE — Telephone Encounter (Signed)
New message    FYI Dropped off FMLA papers yesterday.  Need Dr Graciela Husbands to complete them.  Please call pt when forms are complete

## 2013-11-19 ENCOUNTER — Encounter (HOSPITAL_COMMUNITY): Payer: Self-pay | Admitting: Pharmacy Technician

## 2013-11-22 DIAGNOSIS — I428 Other cardiomyopathies: Secondary | ICD-10-CM | POA: Diagnosis not present

## 2013-11-22 DIAGNOSIS — I5042 Chronic combined systolic (congestive) and diastolic (congestive) heart failure: Secondary | ICD-10-CM | POA: Diagnosis not present

## 2013-11-22 DIAGNOSIS — I10 Essential (primary) hypertension: Secondary | ICD-10-CM | POA: Diagnosis not present

## 2013-11-22 DIAGNOSIS — I509 Heart failure, unspecified: Secondary | ICD-10-CM | POA: Diagnosis not present

## 2013-11-23 ENCOUNTER — Other Ambulatory Visit: Payer: Self-pay | Admitting: *Deleted

## 2013-11-23 MED ORDER — INSULIN ASPART 100 UNIT/ML ~~LOC~~ SOLN: 16.0000 [IU] | Freq: Two times a day (BID) | SUBCUTANEOUS | Status: DC

## 2013-11-25 ENCOUNTER — Other Ambulatory Visit: Payer: Self-pay | Admitting: Endocrinology

## 2013-11-26 ENCOUNTER — Ambulatory Visit: Payer: Federal, State, Local not specified - PPO | Admitting: *Deleted

## 2013-11-26 DIAGNOSIS — I428 Other cardiomyopathies: Secondary | ICD-10-CM

## 2013-11-26 DIAGNOSIS — Z01812 Encounter for preprocedural laboratory examination: Secondary | ICD-10-CM

## 2013-11-30 ENCOUNTER — Other Ambulatory Visit: Payer: Self-pay | Admitting: Endocrinology

## 2013-11-30 ENCOUNTER — Other Ambulatory Visit: Payer: Self-pay | Admitting: Internal Medicine

## 2013-11-30 DIAGNOSIS — M129 Arthropathy, unspecified: Secondary | ICD-10-CM | POA: Diagnosis not present

## 2013-11-30 DIAGNOSIS — I428 Other cardiomyopathies: Secondary | ICD-10-CM | POA: Diagnosis not present

## 2013-11-30 DIAGNOSIS — E119 Type 2 diabetes mellitus without complications: Secondary | ICD-10-CM | POA: Diagnosis not present

## 2013-11-30 DIAGNOSIS — I5022 Chronic systolic (congestive) heart failure: Secondary | ICD-10-CM | POA: Diagnosis not present

## 2013-11-30 DIAGNOSIS — I447 Left bundle-branch block, unspecified: Secondary | ICD-10-CM | POA: Diagnosis not present

## 2013-11-30 DIAGNOSIS — I10 Essential (primary) hypertension: Secondary | ICD-10-CM | POA: Diagnosis not present

## 2013-11-30 DIAGNOSIS — E7800 Pure hypercholesterolemia, unspecified: Secondary | ICD-10-CM | POA: Diagnosis not present

## 2013-11-30 DIAGNOSIS — I509 Heart failure, unspecified: Secondary | ICD-10-CM | POA: Diagnosis not present

## 2013-11-30 DIAGNOSIS — Z794 Long term (current) use of insulin: Secondary | ICD-10-CM | POA: Diagnosis not present

## 2013-11-30 MED ORDER — GENTAMICIN SULFATE 40 MG/ML IJ SOLN
80.0000 mg | INTRAMUSCULAR | Status: DC
  Filled 2013-11-30: qty 2

## 2013-11-30 MED ORDER — CHLORHEXIDINE GLUCONATE 4 % EX LIQD
60.0000 mL | Freq: Once | CUTANEOUS | Status: DC
  Filled 2013-11-30: qty 60

## 2013-11-30 MED ORDER — CEFAZOLIN SODIUM-DEXTROSE 2-3 GM-% IV SOLR
2.0000 g | INTRAVENOUS | Status: DC
  Filled 2013-11-30: qty 50

## 2013-12-01 ENCOUNTER — Encounter (HOSPITAL_COMMUNITY)
Admission: RE | Disposition: A | Discharge status: Home / Self Care | Payer: Self-pay | Source: Ambulatory Visit | Attending: Internal Medicine

## 2013-12-01 ENCOUNTER — Ambulatory Visit (HOSPITAL_COMMUNITY)
Admission: RE | Admit: 2013-12-01 | Discharge: 2013-12-02 | Disposition: A | Discharge status: Home / Self Care | Payer: Federal, State, Local not specified - PPO | Source: Ambulatory Visit | Attending: Internal Medicine | Admitting: Internal Medicine

## 2013-12-01 DIAGNOSIS — I447 Left bundle-branch block, unspecified: Secondary | ICD-10-CM

## 2013-12-01 DIAGNOSIS — M129 Arthropathy, unspecified: Secondary | ICD-10-CM | POA: Insufficient documentation

## 2013-12-01 DIAGNOSIS — I10 Essential (primary) hypertension: Secondary | ICD-10-CM | POA: Diagnosis not present

## 2013-12-01 DIAGNOSIS — I428 Other cardiomyopathies: Secondary | ICD-10-CM | POA: Diagnosis not present

## 2013-12-01 DIAGNOSIS — I509 Heart failure, unspecified: Secondary | ICD-10-CM

## 2013-12-01 DIAGNOSIS — E119 Type 2 diabetes mellitus without complications: Secondary | ICD-10-CM | POA: Diagnosis not present

## 2013-12-01 DIAGNOSIS — E1165 Type 2 diabetes mellitus with hyperglycemia: Secondary | ICD-10-CM

## 2013-12-01 DIAGNOSIS — E7800 Pure hypercholesterolemia, unspecified: Secondary | ICD-10-CM | POA: Insufficient documentation

## 2013-12-01 DIAGNOSIS — E785 Hyperlipidemia, unspecified: Secondary | ICD-10-CM

## 2013-12-01 DIAGNOSIS — I5022 Chronic systolic (congestive) heart failure: Secondary | ICD-10-CM | POA: Insufficient documentation

## 2013-12-01 DIAGNOSIS — IMO0001 Reserved for inherently not codable concepts without codable children: Secondary | ICD-10-CM

## 2013-12-01 DIAGNOSIS — Z794 Long term (current) use of insulin: Secondary | ICD-10-CM | POA: Insufficient documentation

## 2013-12-01 HISTORY — PX: BI-VENTRICULAR IMPLANTABLE CARDIOVERTER DEFIBRILLATOR: SHX5459

## 2013-12-01 LAB — CBC
HCT: 43.4 % (ref 36.0–46.0)
Hemoglobin: 14.9 g/dL (ref 12.0–15.0)
MCH: 29.6 pg (ref 26.0–34.0)
MCHC: 34.3 g/dL (ref 30.0–36.0)
MCV: 86.3 fL (ref 78.0–100.0)
Platelets: 301 10*3/uL (ref 150–400)
RBC: 5.03 MIL/uL (ref 3.87–5.11)
RDW: 13.9 % (ref 11.5–15.5)
WBC: 8.2 10*3/uL (ref 4.0–10.5)

## 2013-12-01 LAB — BASIC METABOLIC PANEL
BUN: 19 mg/dL (ref 6–23)
CO2: 25 mEq/L (ref 19–32)
Calcium: 9.7 mg/dL (ref 8.4–10.5)
Chloride: 102 mEq/L (ref 96–112)
Creatinine, Ser: 0.51 mg/dL (ref 0.50–1.10)
GFR calc Af Amer: >90 mL/min (ref >90)
GFR calc non Af Amer: >90 mL/min (ref >90)
Glucose, Bld: 158 mg/dL — ABNORMAL HIGH (ref 70–99)
Potassium: 4.2 mEq/L (ref 3.7–5.3)
Sodium: 141 mEq/L (ref 137–147)

## 2013-12-01 LAB — GLUCOSE, CAPILLARY
Glucose-Capillary: 126 mg/dL — ABNORMAL HIGH (ref 70–99)
Glucose-Capillary: 73 mg/dL (ref 70–99)

## 2013-12-01 LAB — SURGICAL PCR SCREEN
MRSA, PCR: NEGATIVE
Staphylococcus aureus: NEGATIVE

## 2013-12-01 SURGERY — BI-VENTRICULAR IMPLANTABLE CARDIOVERTER DEFIBRILLATOR  (CRT-D)
Anesthesia: LOCAL

## 2013-12-01 MED ORDER — INSULIN DETEMIR 100 UNIT/ML ~~LOC~~ SOLN
16.0000 [IU] | Freq: Every day | SUBCUTANEOUS | Status: DC
Start: 2013-12-01 — End: 2013-12-02
  Administered 2013-12-01: 16 [IU] via SUBCUTANEOUS
  Filled 2013-12-01 (×2): qty 0.16

## 2013-12-01 MED ORDER — CEFAZOLIN SODIUM 1-5 GM-% IV SOLN
1.0000 g | Freq: Four times a day (QID) | INTRAVENOUS | Status: AC
  Administered 2013-12-01 – 2013-12-02 (×3): 1 g via INTRAVENOUS
  Filled 2013-12-01 (×3): qty 50

## 2013-12-01 MED ORDER — FENTANYL CITRATE 0.05 MG/ML IJ SOLN
INTRAMUSCULAR | Status: AC
  Filled 2013-12-01: qty 2

## 2013-12-01 MED ORDER — CANAGLIFLOZIN 300 MG PO TABS
150.0000 mg | ORAL_TABLET | Freq: Two times a day (BID) | ORAL | Status: DC
  Filled 2013-12-01 (×2): qty 1

## 2013-12-01 MED ORDER — MIDAZOLAM HCL 5 MG/5ML IJ SOLN
INTRAMUSCULAR | Status: AC
  Filled 2013-12-01: qty 5

## 2013-12-01 MED ORDER — SODIUM CHLORIDE 0.9 % IV SOLN: INTRAVENOUS | Status: AC

## 2013-12-01 MED ORDER — IRBESARTAN 150 MG PO TABS: 150.0000 mg | ORAL_TABLET | Freq: Every day | ORAL | Status: DC

## 2013-12-01 MED ORDER — MUPIROCIN 2 % EX OINT
TOPICAL_OINTMENT | Freq: Once | CUTANEOUS | Status: AC
  Administered 2013-12-01: 1 via NASAL

## 2013-12-01 MED ORDER — MUPIROCIN 2 % EX OINT
TOPICAL_OINTMENT | CUTANEOUS | Status: AC
  Administered 2013-12-01: 1 via NASAL
  Filled 2013-12-01: qty 22

## 2013-12-01 MED ORDER — NAPROXEN 500 MG PO TABS
500.0000 mg | ORAL_TABLET | Freq: Two times a day (BID) | ORAL | Status: DC
  Administered 2013-12-01 – 2013-12-02 (×2): 500 mg via ORAL
  Filled 2013-12-01 (×4): qty 1

## 2013-12-01 MED ORDER — LIRAGLUTIDE 18 MG/3ML ~~LOC~~ SOPN: 1.2000 mg | PEN_INJECTOR | Freq: Every day | SUBCUTANEOUS | Status: DC

## 2013-12-01 MED ORDER — ONDANSETRON HCL 4 MG/2ML IJ SOLN: 4.0000 mg | Freq: Four times a day (QID) | INTRAMUSCULAR | Status: DC | PRN

## 2013-12-01 MED ORDER — SODIUM CHLORIDE 0.9 % IV SOLN
INTRAVENOUS | Status: DC
  Administered 2013-12-01: 10:00:00 via INTRAVENOUS

## 2013-12-01 MED ORDER — BUPROPION HCL ER (XL) 300 MG PO TB24
300.0000 mg | ORAL_TABLET | Freq: Every day | ORAL | Status: DC
  Administered 2013-12-01 – 2013-12-02 (×2): 300 mg via ORAL
  Filled 2013-12-01 (×2): qty 1

## 2013-12-01 MED ORDER — METFORMIN HCL 500 MG PO TABS: 1000.0000 mg | ORAL_TABLET | Freq: Two times a day (BID) | ORAL | Status: DC

## 2013-12-01 MED ORDER — CANAGLIFLOZIN-METFORMIN HCL 150-1000 MG PO TABS: 150.0000 mg | ORAL_TABLET | Freq: Two times a day (BID) | ORAL | Status: DC

## 2013-12-01 MED ORDER — CANAGLIFLOZIN 300 MG PO TABS
150.0000 mg | ORAL_TABLET | Freq: Two times a day (BID) | ORAL | Status: DC
  Administered 2013-12-02: 150 mg via ORAL
  Filled 2013-12-01 (×3): qty 1

## 2013-12-01 MED ORDER — OXYBUTYNIN CHLORIDE ER 5 MG PO TB24
5.0000 mg | ORAL_TABLET | Freq: Every day | ORAL | Status: DC
  Administered 2013-12-01 – 2013-12-02 (×2): 5 mg via ORAL
  Filled 2013-12-01 (×2): qty 1

## 2013-12-01 MED ORDER — LIDOCAINE HCL (PF) 1 % IJ SOLN
INTRAMUSCULAR | Status: AC
  Filled 2013-12-01: qty 60

## 2013-12-01 MED ORDER — INSULIN ASPART 100 UNIT/ML ~~LOC~~ SOLN
16.0000 [IU] | Freq: Two times a day (BID) | SUBCUTANEOUS | Status: DC
  Administered 2013-12-01: 16 [IU] via SUBCUTANEOUS

## 2013-12-01 MED ORDER — CARVEDILOL 12.5 MG PO TABS
12.5000 mg | ORAL_TABLET | Freq: Two times a day (BID) | ORAL | Status: DC
  Administered 2013-12-01 – 2013-12-02 (×2): 12.5 mg via ORAL
  Filled 2013-12-01 (×4): qty 1

## 2013-12-01 MED ORDER — INSULIN DETEMIR 100 UNIT/ML ~~LOC~~ SOLN
22.0000 [IU] | Freq: Every day | SUBCUTANEOUS | Status: DC
  Filled 2013-12-01: qty 0.22

## 2013-12-01 MED ORDER — ATORVASTATIN CALCIUM 40 MG PO TABS
40.0000 mg | ORAL_TABLET | Freq: Every day | ORAL | Status: DC
  Administered 2013-12-01: 40 mg via ORAL
  Filled 2013-12-01 (×2): qty 1

## 2013-12-01 MED ORDER — FUROSEMIDE 20 MG PO TABS
20.0000 mg | ORAL_TABLET | Freq: Every day | ORAL | Status: DC
  Administered 2013-12-01 – 2013-12-02 (×2): 20 mg via ORAL
  Filled 2013-12-01 (×2): qty 1

## 2013-12-01 MED ORDER — IRBESARTAN 300 MG PO TABS
300.0000 mg | ORAL_TABLET | Freq: Every day | ORAL | Status: DC
  Administered 2013-12-02: 300 mg via ORAL
  Filled 2013-12-01: qty 1

## 2013-12-01 MED ORDER — METFORMIN HCL 500 MG PO TABS
1000.0000 mg | ORAL_TABLET | Freq: Two times a day (BID) | ORAL | Status: DC
  Filled 2013-12-01 (×2): qty 2

## 2013-12-01 MED ORDER — ACETAMINOPHEN 325 MG PO TABS: 325.0000 mg | ORAL_TABLET | ORAL | Status: DC | PRN

## 2013-12-01 NOTE — Discharge Summary (Signed)
ELECTROPHYSIOLOGY DISCHARGE SUMMARY   Patient ID: Linda Lowe,  MRN: [id number], DOB/AGE: 1951-06-07 63 y.o.  Admit date: 12/01/2013 Discharge date: 12/02/2013  Primary Care Physician: Renaye Rakers, MD Primary EP: Berton Mount, MD  Primary Discharge Diagnosis:  1. Nonischemic CM with chronic systolic HF and LBBB s/p CRT-D implantation  Secondary Discharge Diagnoses:  1. HTN 2. Dyslipidemia 3. DM 4. History of bradycardia  Procedures This Admission:  1. CRT-D implantation  RA lead - St. Jude 1688 TC 52 cm active fixation atrial lead, serial number [id number] RV lead - St. Jude 7122Q 58 cm single coil defibrillator lead, serial number [id number] LV lead - Medtronic 4398 88 cm lead serial number [id number]  Device - St. Jude biventricular defibrillator serial number [id number], serial number [id number]  History and Hospital Course:  Linda Lowe is a 63 year old woman with a NICM, EF 30%, chronic systolic HF and LBBB who presented yesterday for BiV ICD implantation for CRT and primary prevention of SCD. Linda Lowe tolerated this procedure well without any immediate complication. She remains hemodynamically stable and afebrile. Her chest xray shows stable lead placement without pneumothorax. Her device interrogation shows normal CRT-D function with stable lead parameters/measurements. Her implant site is intact without significant bleeding or hematoma. She has been given discharge instructions including wound care and activity restrictions. She will follow-up in 10 days for wound check. Of note, she is on guideline-directed medical therapy with Coreg and ARB. She has been seen, examined and deemed stable for discharge today by Dr. Berton Mount.   Physical Exam: Vitals: Blood pressure 145/65, pulse 70, temperature 98.1 F (36.7 C), temperature source Oral, resp. rate 20, height 5' 3.5" (1.613 m), weight 170 lb 1.6 oz (77.157 kg), SpO2 95.00%.  General: Well developed, well appearing 63 year old  female in no acute distress. Neck: Supple. JVD not elevated. Lungs: Clear bilaterally to auscultation without wheezes, rales, or rhonchi. Breathing is unlabored. Heart: RRR S1 S2 without murmurs, rubs, or gallops.  Abdomen: Soft, non-distended. Extremities: No clubbing or cyanosis. No edema.  Distal pedal pulses are 2+ and equal bilaterally. Neuro: Alert and oriented X 3. Moves all extremities spontaneously. No focal deficits. Skin: Left upper chest / implant site intact without bleeding or hematoma.  Labs: Lab Results  Component Value Date   WBC 8.2 12/01/2013   HGB 14.9 12/01/2013   HCT 43.4 12/01/2013   MCV 86.3 12/01/2013   PLT 301 12/01/2013     Recent Labs Lab 12/01/13 1001  NA 141  K 4.2  CL 102  CO2 25  BUN 19  CREATININE 0.51  CALCIUM 9.7  GLUCOSE 158*   Lab Results  Component Value Date   CKTOTAL 106 05/23/2012   CKMB 3.6 05/23/2012   TROPONINI <0.30 05/23/2012    Disposition:  The patient is being discharged in stable condition.  Follow-up:     Follow-up Information   Follow up with [facility] On 12/13/2013. (At 10:30 AM for wound check)    Specialty:  Cardiology   Contact information:   [address] [place] [address] [phone]      Follow up with Rick Duff, PA-C On 01/11/2014. (At 11:30 AM (Dr. Odessa Fleming PA))    Specialty:  Cardiology   Contact information:   [address] [place] [address] [phone]       Follow up with Sherryl Manges, MD On 03/07/2014. (At 10:15 AM)    Specialty:  Cardiology   Contact information:   [address] [place] [address] [phone]      Discharge Medications:    Medication List         atorvastatin 40 MG tablet  Commonly known as:  LIPITOR  TAKE 1 TABLET (40 MG TOTAL) BY MOUTH DAILY.     BAYER CONTOUR NEXT TEST test strip  Generic drug:  glucose blood     buPROPion 300 MG 24 hr tablet  Commonly known as:  WELLBUTRIN XL    Take 300 mg by mouth daily.     Canagliflozin-Metformin HCl [phone] MG Tabs  Take 150-1,000 mg by mouth 2 (two) times daily. HOLD for 2 days. Resume usual dose on Saturday 12/04/2013.     carvedilol 12.5 MG tablet  Commonly known as:  COREG  Take 12.5 mg by mouth 2 (two) times daily with a meal.     EPIPEN 0.3 mg/0.3 mL Devi  Generic drug:  EPINEPHrine  Inject 0.3 mg into the muscle as needed. For allergic reactions     furosemide 20 MG tablet  Commonly known as:  LASIX  Take 20 mg by mouth daily.     insulin aspart 100 UNIT/ML injection  Commonly known as:  NOVOLOG FLEXPEN  Inject 16 Units into the skin 2 (two) times daily before lunch and supper.     Insulin Pen Needle 31G X 5 MM Misc  Commonly known as:  B-D UF III MINI PEN NEEDLES  Use 5 pen needles per day as instructed     irbesartan 300 MG tablet  Commonly known as:  AVAPRO  Take 300 mg by mouth daily.     KLOR-CON M20 20 MEQ tablet  Generic drug:  potassium chloride SA  TAKE 1 TABLET (20 MEQ TOTAL) BY MOUTH DAILY.     LEVEMIR FLEXPEN 100 UNIT/ML injection  Generic drug:  insulin detemir  Inject 16-22 Units into the skin 2 (two) times daily before lunch and supper. Takes 22 units every morning and 16 units every evening     Liraglutide 18 MG/3ML Sopn  Commonly known as:  VICTOZA  Inject 1.2 mg into the skin daily.     naproxen 500 MG tablet  Commonly known as:  NAPROSYN  Take 500 mg by mouth 2 (two) times daily with a meal.     oxybutynin 5 MG 24 hr tablet  Commonly known as:  DITROPAN-XL  Take 5 mg by mouth daily.       Duration of Discharge Encounter: Greater than 30 minutes including physician time.  Signed, Rick Duff, PA-C 12/02/2013, 9:51 AM

## 2013-12-01 NOTE — Interval H&P Note (Signed)
ICD Criteria  Current LVEF:25 % ;Obtained > or = 1 month ago and < or = 3 months ago.  NYHA Functional Classification: Class III  Heart Failure History:  Yes, Duration of heart failure since onset is > 9 months  Non-Ischemic Dilated Cardiomyopathy History:  Yes, timeframe is > 9 months  Atrial Fibrillation/Atrial Flutter:  No.  Ventricular Tachycardia History:  No.  Cardiac Arrest History:  No  History of Syndromes with Risk of Sudden Death:  No.  Previous ICD:  No.  Electrophysiology Study: No.  Prior MI: No.  PPM: No.  OSA:  No  Patient Life Expectancy of >=1 year: Yes.  Anticoagulation Therapy:  Patient is NOT on anticoagulation therapy.   Beta Blocker Therapy:  No, Reason not on Beta Blocker therapy: bradycardia  Ace Inhibitor/ARB Therapy:  Yes.History and Physical Interval Note:  12/01/2013 11:13 AM  Linda Lowe  has presented today for surgery, with the diagnosis of cm  The various methods of treatment have been discussed with the patient and family. After consideration of risks, benefits and other options for treatment, the patient has consented to  Procedure(s): BI-VENTRICULAR IMPLANTABLE CARDIOVERTER DEFIBRILLATOR  (CRT-D) (N/A) as a surgical intervention .  The patient's history has been reviewed, patient examined, no change in status, stable for surgery.  I have reviewed the patient's chart and labs.  Questions were answered to the patient's satisfaction.     Sherryl Manges

## 2013-12-01 NOTE — H&P (Signed)
Patient Care Team: Renaye Rakers, MD as PCP - General (Family Medicine) Florian Buff   HPI  Linda Lowe is a 63 y.o. female Seen a year ago by [facility] and found to have cardiomyopathy-nonischemic cath 9/13 Rx with carvedilol and irbesartan  She is subsequently been seen by Dr. CV  Most recent echocardiogram 12/14 demonstrated persistent left ventricular dysfunction with an EF of 25-35%. She is also noted to have left bundle branch block.  She has dyspnea on exertion about 100-150 feet. She does not carry groceries. She denies edema, PND or syncope.  She has hx of DM HTN   Past Medical History  Diagnosis Date  . Diabetes mellitus     a. Dx > 5 y ago  . Hypertension   . Hypercholesteremia   . Arthritis   . Cardiomyopathy     a. 05/2012 Echo: EF 30-35%, Gr 1 DD, mild LVH, mild MR, pericardial effusion  . Pericardial effusion     a. 05/2012 Echo: circumferential pericardial effusion, mostly 9mm, largest pocket posterior - 6mm, mild RA indentation, no RV collapse, no tamponade.    Past Surgical History  Procedure Laterality Date  . Abdominal hysterectomy    . Foot surgery      Current Facility-Administered Medications  Medication Dose Route Frequency Provider Last Rate Last Dose  . 0.9 %  sodium chloride infusion   Intravenous Continuous Duke Salvia, MD 50 mL/hr at 12/01/13 1011    . ceFAZolin (ANCEF) IVPB 2 g/50 mL premix  2 g Intravenous On Call Duke Salvia, MD      . chlorhexidine (HIBICLENS) 4 % liquid 4 application  60 mL Topical Once Duke Salvia, MD      . gentamicin (GARAMYCIN) 80 mg in sodium chloride irrigation 0.9 % 500 mL irrigation  80 mg Irrigation On Call Duke Salvia, MD       No current facility-administered medications on file prior to encounter.   Current Outpatient Prescriptions on File Prior to Encounter  Medication Sig Dispense Refill  . BAYER CONTOUR NEXT TEST test strip       . buPROPion (WELLBUTRIN XL) 300 MG 24 hr tablet Take 300 mg  by mouth daily.      . Canagliflozin-Metformin HCl [phone] MG TABS Take 150-1,000 mg by mouth 2 (two) times daily.  60 tablet  5  . furosemide (LASIX) 20 MG tablet Take 20 mg by mouth daily.      . insulin detemir (LEVEMIR FLEXPEN) 100 UNIT/ML injection Inject 16-22 Units into the skin 2 (two) times daily before lunch and supper. Takes 22 units every morning and 16 units every evening      . Insulin Pen Needle (B-D UF III MINI PEN NEEDLES) 31G X 5 MM MISC Use 5 pen needles per day as instructed  150 each  11  . irbesartan (AVAPRO) 300 MG tablet Take 300 mg by mouth daily.      . Liraglutide (VICTOZA) 18 MG/3ML SOPN Inject 1.2 mg into the skin daily.  2 pen  5  . naproxen (NAPROSYN) 500 MG tablet Take 500 mg by mouth 2 (two) times daily with a meal.      . oxybutynin (DITROPAN-XL) 5 MG 24 hr tablet Take 5 mg by mouth daily.      Marland Kitchen EPINEPHrine (EPIPEN) 0.3 mg/0.3 mL DEVI Inject 0.3 mg into the muscle as needed. For allergic reactions      . irbesartan (AVAPRO) 150 MG  tablet Take 1 tablet (150 mg total) by mouth daily.  30 tablet  0     No Known Allergies  Review of Systems negative except from HPI and PMH  Physical Exam BP 143/62  Pulse 69  Temp(Src) 98.1 F (36.7 C) (Oral)  Resp 18  Ht 5' 3.5" (1.613 m)  Wt 166 lb (75.297 kg)  BMI 28.94 kg/m2  SpO2 97% Well developed and well nourished in no acute distress HENT normal E scleral and icterus clear Neck Supple JVP flat; carotids brisk and full Clear to ausculation  Regular rate and rhythm, no murmurs gallops or rub Soft with active bowel sounds No clubbing cyanosis  Edema Alert and oriented, grossly normal motor and sensory function Skin Warm and Dry    Assessment and  Plan  NICM  CHF systolic  LBBB  Brdycardia  For Crt today  Have reviewed the potential benefits and risks of ICD implantation including but not limited to death, perforation of heart or lung, lead dislodgement, infection,  device malfunction and  inappropriate shocks.  The patient and familyexpress understanding  and are willing to proceed.

## 2013-12-01 NOTE — Op Note (Signed)
Linda Lowe, HUTT NO.:  [id number]  MEDICAL RECORD NO.:  [id number]  LOCATION:  [id number]                        FACILITY:  [facility]  PHYSICIAN:  Duke Salvia, MD, FACCDATE OF BIRTH:  [birth year]  DATE OF PROCEDURE:  12/01/2013 DATE OF DISCHARGE:                              OPERATIVE REPORT   PREOPERATIVE DIAGNOSES:  Congestive heart failure, nonischemic cardiomyopathy, left bundle-branch block.  POSTOPERATIVE DIAGNOSES:  Congestive heart failure, nonischemic cardiomyopathy, left bundle-branch block.  PROCEDURES:  ICD implantation with left ventricular lead placement.  Following obtaining informed consent, the patient was brought to electrophysiology laboratory and placed on the fluoroscopic table in a supine position.  After routine prep and drape of the left upper chest, lidocaine was infiltrated in the prepectoral subclavicular region. Incision was made and carried down to layer of the prepectoral fascia. Using electrocautery and sharp dissection, a pocket was formed similarly.  Hemostasis was obtained.  Thereafter, attention was turned to gain access to the extrathoracic left subclavian vein, which was accomplished without difficulty without the aspiration or puncture of the artery.  Three separate venipunctures were accomplished and sequentially 9.5, a 7, and 9.5-French sheaths were placed through which were advanced a St. Jude [id number] 58 cm single coil defibrillator lead, serial number [id number], a St. Jude 1688 TC 52 cm active fixation atrial lead, serial number [id number], and a St. Jude 135 CS cannulation catheter.  The RV defibrillator lead was manipulated to the right ventricular apex where the bipolar R-wave was 14 with a pace impedance of 900, threshold 0.6 at 0.5.  Current threshold 0.6 mA.  There was no diaphragmatic pacing at 10 V.  The current of injury was brisk.  This lead was secured to the prepectoral fascia and allowed to be programmed  to backup VVI mode.  We then deployed the atrial lead to the right atrial appendage where bipolar P-wave was 5.0, pace impedance of 590, threshold 0.5 at 0.5. Current threshold was 0.9 mA and diaphragmatic pacing was absent at 10 V.  The current of injury was brisk.  This lead was secured to the prepectoral fascia.  The coronary sinus was then cannulated without difficulty.  A venogram demonstrated a midlateral branch with a proximal lateral takeoff.  We initially cannulating this, placed a whisper wire, but we were unable to deploy the St. Jude 1458 lead through the secondary bend.  We then tried exploring high lateral vein, but unfortunately we were only able to cannulate an anterior branch.  We then went back to the posterolateral/lateral branch and after cannulation placed, a stiffer wire and then a Medtronic 4398 88 cm lead serial number [id number] move easily over the wire into a location between the mid and distal third. In this location, the L wave was 4, the pace impedance of 650, threshold 1.6 at 0.5, current threshold 1.8 mA.  There was no diaphragmatic pacing at 10 V.  The LV/RV spacing was 120 milliseconds.  We then looked at the 1-2 spacing, which was 133 milliseconds.  We then attached the leads to a St. Jude biventricular defibrillator serial number [id number], serial number [id number].  Through this device, bipolar R-wave was 1.8  with a pace impedance that is not recorded here and threshold 0.5 at 0.5.  The RA amplitude was 4.5 with a pace impedance of 0.5 at 0.5 and the LV threshold was 1.2 at 0.5 and then to coil configuration.  QRS duration at this time was 135 milliseconds. The pocket was copiously irrigated with antibiotic containing saline solution.  Hemostasis was assured.  Leads and pulse generator were placed in the pocket, secured to the prepectoral fascia.  Wound was closed in 3 layers in normal fashion.  Wound was washed, dried, and a Dermabond dressing  was applied.  Needle counts, sponge counts, and instrument counts were correct at the end of the procedure according to the staff.  The patient tolerated the procedure without apparent complications.     Duke Salvia, MD, [facility]     SCK/MEDQ  D:  12/01/2013  T:  12/01/2013  Job:  [id number]

## 2013-12-01 NOTE — CV Procedure (Signed)
Linda Lowe [id number]  [id number]  Preop IN:OMVEHMCNOBS cardiomyopathy LLBB CHF Postop Dx same/   Procedure:CRT -D implantation  Cx: None   Dictation number [id number]  Sherryl Manges, MD 12/01/2013 3:43 PM

## 2013-12-02 ENCOUNTER — Ambulatory Visit (HOSPITAL_COMMUNITY): Payer: Federal, State, Local not specified - PPO

## 2013-12-02 ENCOUNTER — Telehealth: Payer: Self-pay

## 2013-12-02 DIAGNOSIS — I428 Other cardiomyopathies: Secondary | ICD-10-CM

## 2013-12-02 DIAGNOSIS — I509 Heart failure, unspecified: Secondary | ICD-10-CM | POA: Diagnosis not present

## 2013-12-02 DIAGNOSIS — I10 Essential (primary) hypertension: Secondary | ICD-10-CM | POA: Diagnosis not present

## 2013-12-02 DIAGNOSIS — I447 Left bundle-branch block, unspecified: Secondary | ICD-10-CM | POA: Diagnosis not present

## 2013-12-02 DIAGNOSIS — E119 Type 2 diabetes mellitus without complications: Secondary | ICD-10-CM | POA: Diagnosis not present

## 2013-12-02 DIAGNOSIS — I5022 Chronic systolic (congestive) heart failure: Secondary | ICD-10-CM | POA: Diagnosis not present

## 2013-12-02 LAB — GLUCOSE, CAPILLARY: Glucose-Capillary: 125 mg/dL — ABNORMAL HIGH (ref 70–99)

## 2013-12-02 MED ORDER — CANAGLIFLOZIN-METFORMIN HCL 150-1000 MG PO TABS: 150.0000 mg | ORAL_TABLET | Freq: Two times a day (BID) | ORAL | Status: DC

## 2013-12-02 NOTE — Telephone Encounter (Signed)
Called pt this morning - advised to contact Metformin prescribing doctor to discuss refill. Explained FMLA forms to be filled out today . Pt's husband agreeable to plan.

## 2013-12-02 NOTE — Progress Notes (Signed)
Reviewed discharge instructions with patient and she stated her understanding.  Discharged home with husband.  Colman Cater

## 2013-12-02 NOTE — Telephone Encounter (Signed)
FMLA papers completed. Left on Linda Lowe (medical records) desk.

## 2013-12-02 NOTE — Progress Notes (Signed)
UR Completed Avina Eberle Graves-Bigelow, RN,BSN [phone]  

## 2013-12-02 NOTE — Discharge Instructions (Signed)
° °  Supplemental Discharge Instructions for  Pacemaker/Defibrillator Patients  Activity No heavy lifting or vigorous activity with your left/right arm for 6 to 8 weeks.  Do not raise your left/right arm above your head for one week.  Gradually raise your affected arm as drawn below.           03/21                      03/22                        03/23                     03/24       NO DRIVING for 1 week; you may begin driving on 38/46/6599. WOUND CARE   Keep the wound area clean and dry.  You may shower but no soaking in tub bath, swimming pool or hot tub for 7-10 days until wound completely healed.    The Dermabond (glue) on your wound will fall off on its own; do not pull it off.  No bandage is needed on the site.  DO  NOT apply any creams, oils, or ointments to the wound area.   If you notice any drainage or discharge from the wound, any swelling or bruising at the site, or you develop a fever > 101? F after you are discharged home, call the office at once.  Special Instructions   You are still able to use cellular telephones; use the ear opposite the side where you have your pacemaker/defibrillator.  Avoid carrying your cellular phone near your device.   When traveling through airports, show security personnel your identification card to avoid being screened in the metal detectors.  Ask the security personnel to use the hand wand.   Avoid arc welding equipment, MRI testing (magnetic resonance imaging), TENS units (transcutaneous nerve stimulators).  Call the office for questions about other devices.   Avoid electrical appliances that are in poor condition or are not properly grounded.   Microwave ovens are safe to be near or to operate.  Additional information for defibrillator patients should your device go off:   If your device goes off ONCE and you feel fine afterward, notify the device clinic nurses.   If your device goes off ONCE and you do not feel well afterward, call 911.   If your device goes off TWICE, call 911.   If your device goes off THREE times in one day, call 911.  DO NOT DRIVE YOURSELF OR A FAMILY MEMBER WITH A DEFIBRILLATOR TO THE HOSPITAL--CALL 911.

## 2013-12-02 NOTE — Progress Notes (Signed)
        Patient Name: Linda Lowe      SUBJECTIVE: with out pain or sob  Past Medical History  Diagnosis Date  . Diabetes mellitus     a. Dx > 5 y ago  . Hypertension   . Hypercholesteremia   . Arthritis   . Cardiomyopathy     a. 05/2012 Echo: EF 30-35%, Gr 1 DD, mild LVH, mild MR, pericardial effusion  . Pericardial effusion     a. 05/2012 Echo: circumferential pericardial effusion, mostly 22mm, largest pocket posterior - 74mm, mild RA indentation, no RV collapse, no tamponade.    Scheduled Meds:  Scheduled Meds: . atorvastatin  40 mg Oral q1800  . buPROPion  300 mg Oral Daily  . [START ON 12/04/2013] metFORMIN  1,000 mg Oral BID WC   And  . Canagliflozin  150 mg Oral BID WC  . carvedilol  12.5 mg Oral BID WC  . furosemide  20 mg Oral Daily  . insulin aspart  16 Units Subcutaneous BID AC  . insulin detemir  16 Units Subcutaneous QHS  . insulin detemir  22 Units Subcutaneous Daily  . irbesartan  300 mg Oral Daily  . Liraglutide  1.2 mg Subcutaneous Daily  . naproxen  500 mg Oral BID WC  . oxybutynin  5 mg Oral Daily   Continuous Infusions:   PHYSICAL EXAM Filed Vitals:   12/01/13 1700 12/01/13 1920 12/01/13 2110 12/02/13 0645  BP: 151/62 146/92 140/83 145/65  Pulse:  81 94 70  Temp:  98.3 F (36.8 C)  98.1 F (36.7 C)  TempSrc:  Oral  Oral  Resp:  18 20 20   Height:      Weight:    170 lb 1.6 oz (77.157 kg)  SpO2:  94% 97% 95%   Well developed and nourished in no acute distress HENT normal Neck supple with JVP-flat Clear Regular rate and rhythm, no murmurs or gallops Abd-soft with active BS No Clubbing cyanosis edema Skin-warm and dry A & Oriented  Grossly normal sensory and motor fun.pes .psps   Intake/Output Summary (Last 24 hours) at 12/02/13 0658 Last data filed at 12/01/13 1930  Gross per 24 hour  Intake    360 ml  Output   2400 ml  Net  -2040 ml    LABS: Basic Metabolic Panel:  Recent Labs Lab 12/01/13 1001  NA 141  K 4.2  CL  102  CO2 25  GLUCOSE 158*  BUN 19  CREATININE 0.51  CALCIUM 9.7   Cardiac Enzymes: No results found for this basename: CKTOTAL, CKMB, CKMBINDEX, TROPONINI,  in the last 72 hours CBC:  Recent Labs Lab 12/01/13 1001  WBC 8.2  HGB 14.9  HCT 43.4  MCV 86.3  PLT 301   PROTIME: No results found for this basename: LABPROT, INR,  in the last 72 hours Liver Function Tests: No results found for this basename: AST, ALT, ALKPHOS, BILITOT, PROT, ALBUMIN,  in the last 72 hours No results found for this basename: LIPASE, AMYLASE,  in the last 72 hours BNP:  Device Interrogation    ASSESSMENT AND PLAN:  Active Problems:   Chronic systolic heart failure begin coreg   Signed, 12/03/13 MD  12/02/2013  Called with phrenic pacing CXR suggests some retraction will reprogram to distal pacing

## 2013-12-03 ENCOUNTER — Other Ambulatory Visit: Payer: Federal, State, Local not specified - PPO

## 2013-12-03 NOTE — Telephone Encounter (Signed)
FMLA Signed,Scanned In & Also Left VM On Pt's Cell ready For Pick Up

## 2013-12-06 ENCOUNTER — Ambulatory Visit: Payer: Federal, State, Local not specified - PPO | Admitting: Endocrinology

## 2013-12-09 NOTE — Telephone Encounter (Signed)
Follow up     Husband said he was told that there is no FMLA papers up front to be picked up. Please call him to let him know if the papers are complete

## 2013-12-09 NOTE — Telephone Encounter (Signed)
Advised pt's husband paperwork is in medical record office. Stop by tomorrow to pick them up. He is agreeable to plan.

## 2013-12-13 ENCOUNTER — Ambulatory Visit (INDEPENDENT_AMBULATORY_CARE_PROVIDER_SITE_OTHER): Payer: Federal, State, Local not specified - PPO | Admitting: *Deleted

## 2013-12-13 ENCOUNTER — Encounter: Payer: Self-pay | Admitting: Internal Medicine

## 2013-12-13 DIAGNOSIS — I5022 Chronic systolic (congestive) heart failure: Secondary | ICD-10-CM | POA: Diagnosis not present

## 2013-12-13 DIAGNOSIS — I428 Other cardiomyopathies: Secondary | ICD-10-CM | POA: Diagnosis not present

## 2013-12-13 LAB — MDC_IDC_ENUM_SESS_TYPE_INCLINIC
Battery Remaining Longevity: 75.6 mo
Brady Statistic RA Percent Paced: 42 %
Brady Statistic RV Percent Paced: 88 %
Date Time Interrogation Session: 20150330123124
HighPow Impedance: 60 Ohm
Implantable Pulse Generator Serial Number: 7155584
Lead Channel Impedance Value: 487.5 Ohm
Lead Channel Impedance Value: 562.5 Ohm
Lead Channel Impedance Value: 850 Ohm
Lead Channel Pacing Threshold Amplitude: 0.625 V
Lead Channel Pacing Threshold Amplitude: 0.625 V
Lead Channel Pacing Threshold Amplitude: 2.75 V
Lead Channel Pacing Threshold Amplitude: 2.75 V
Lead Channel Pacing Threshold Pulse Width: 0.5 ms
Lead Channel Pacing Threshold Pulse Width: 0.5 ms
Lead Channel Pacing Threshold Pulse Width: 0.5 ms
Lead Channel Pacing Threshold Pulse Width: 0.5 ms
Lead Channel Sensing Intrinsic Amplitude: 11.8 mV
Lead Channel Sensing Intrinsic Amplitude: 2.1 mV
Lead Channel Setting Pacing Amplitude: 1.625
Lead Channel Setting Pacing Amplitude: 2 V
Lead Channel Setting Pacing Amplitude: 3.75 V
Lead Channel Setting Pacing Pulse Width: 0.5 ms
Lead Channel Setting Pacing Pulse Width: 0.5 ms
Lead Channel Setting Sensing Sensitivity: 0.5 mV
Zone Setting Detection Interval: 250 ms
Zone Setting Detection Interval: 280 ms
Zone Setting Detection Interval: 330 ms

## 2013-12-13 NOTE — Progress Notes (Signed)
Wound check appointment. No steri-strips removed, surgical glue present. Wound without redness or edema. Incision edges approximated, wound well healed. Normal device function. Thresholds, sensing, and impedances consistent with implant measurements for RA & RV. LV threshold up, attempted multiple vectors--pt had diaphragmatic stim at ideal settings; changed LV vector from D1-M2 to D1-P4, turned off LV cap confirm, set fixed outputfrom 4.25 to 3.75V. Device programmed at 3.5V/auto capture for extra safety margin until 3 month visit. Histogram distribution appropriate for patient and level of activity. No mode switches or ventricular arrhythmias noted. Patient educated about wound care, arm mobility, lifting restrictions, shock plan. ROV w/ Brooke 01/11/14 @11 :30.

## 2013-12-24 ENCOUNTER — Encounter: Payer: Self-pay | Admitting: Cardiology

## 2014-01-11 ENCOUNTER — Encounter: Payer: Federal, State, Local not specified - PPO | Admitting: Cardiology

## 2014-01-24 ENCOUNTER — Other Ambulatory Visit (INDEPENDENT_AMBULATORY_CARE_PROVIDER_SITE_OTHER): Payer: Federal, State, Local not specified - PPO

## 2014-01-24 DIAGNOSIS — IMO0001 Reserved for inherently not codable concepts without codable children: Secondary | ICD-10-CM

## 2014-01-24 DIAGNOSIS — E1165 Type 2 diabetes mellitus with hyperglycemia: Principal | ICD-10-CM

## 2014-01-24 LAB — COMPREHENSIVE METABOLIC PANEL
ALT: 44 U/L — ABNORMAL HIGH (ref 0–35)
AST: 32 U/L (ref 0–37)
Albumin: 4.1 g/dL (ref 3.5–5.2)
Alkaline Phosphatase: 125 U/L — ABNORMAL HIGH (ref 39–117)
BUN: 23 mg/dL (ref 6–23)
CO2: 29 mEq/L (ref 19–32)
Calcium: 9.4 mg/dL (ref 8.4–10.5)
Chloride: 102 mEq/L (ref 96–112)
Creatinine, Ser: 0.7 mg/dL (ref 0.4–1.2)
GFR: 93.31 mL/min (ref >60.00)
Glucose, Bld: 177 mg/dL — ABNORMAL HIGH (ref 70–99)
Potassium: 4 mEq/L (ref 3.5–5.1)
Sodium: 138 mEq/L (ref 135–145)
Total Bilirubin: 0.4 mg/dL (ref 0.2–1.2)
Total Protein: 7.9 g/dL (ref 6.0–8.3)

## 2014-01-24 LAB — HEMOGLOBIN A1C: Hgb A1c MFr Bld: 7.9 % — ABNORMAL HIGH (ref 4.6–6.5)

## 2014-01-24 LAB — MICROALBUMIN / CREATININE URINE RATIO
Creatinine,U: 44 mg/dL
Microalb Creat Ratio: 3.6 mg/g (ref 0.0–30.0)
Microalb, Ur: 1.6 mg/dL (ref 0.0–1.9)

## 2014-01-24 LAB — LIPID PANEL
Cholesterol: 170 mg/dL (ref 0–200)
HDL: 64.1 mg/dL (ref >39.00)
LDL Cholesterol: 75 mg/dL (ref 0–99)
Total CHOL/HDL Ratio: 3
Triglycerides: 154 mg/dL — ABNORMAL HIGH (ref 0.0–149.0)
VLDL: 30.8 mg/dL (ref 0.0–40.0)

## 2014-01-28 ENCOUNTER — Ambulatory Visit (INDEPENDENT_AMBULATORY_CARE_PROVIDER_SITE_OTHER): Payer: Federal, State, Local not specified - PPO | Admitting: Endocrinology

## 2014-01-28 ENCOUNTER — Encounter: Payer: Self-pay | Admitting: Endocrinology

## 2014-01-28 VITALS — BP 138/96 | HR 86 | Temp 97.9°F | Resp 16 | Ht 63.0 in | Wt 176.2 lb

## 2014-01-28 DIAGNOSIS — E785 Hyperlipidemia, unspecified: Secondary | ICD-10-CM | POA: Diagnosis not present

## 2014-01-28 DIAGNOSIS — I10 Essential (primary) hypertension: Secondary | ICD-10-CM

## 2014-01-28 DIAGNOSIS — IMO0001 Reserved for inherently not codable concepts without codable children: Secondary | ICD-10-CM

## 2014-01-28 DIAGNOSIS — E1165 Type 2 diabetes mellitus with hyperglycemia: Principal | ICD-10-CM

## 2014-01-28 NOTE — Progress Notes (Signed)
Patient ID: Linda Lowe, female   DOB: July 01, 1951, 63 y.o.   MRN: [id number]  Reason for Appointment: Diabetes follow-up   History of Present Illness   Diagnosis: date of diagnosis: 3-4 years ago.   PAST history: Her diabetes has been usually poorly controlled. She has been on multiple medications including Victoza and basal bolus regimen also. However she  had difficulty with consistent compliance with insulin, diet and exercise regimen. With starting Victoza her blood sugars appeared to be improved. After repeated sessions with educators she was able to improve her compliance level and blood sugars had been improving since about 10/13. Insulin doses have been increased overall  A1c previously had been usually over 8% and below this for the first time in 2/14. She had been switched to a combination of Invokana and metformin instead of Janumet in late 2014  RECENT history:  In 1/15 her blood sugars are excellent with A1c 7% However since then her blood sugars are gradually increasing rising A1c Difficult to assess her blood sugar pattern since she is checking blood sugars mostly in the mornings Hypoglycemia: She had 3 readings below 65 last month on waking up at about 9 AM and she thinks this was also be from not eating as much, no recent hypoglycemia Compliance with insulin: Reportedly good but is taking her Levemir at suppertime instead of bedtime as previously discussed She thinks she is watching her diet but has gained weight  Also taking Victoza as directed  Current insulin regimen: Levemir 22 units AT 9 AM AND 16 at supper. NOVOLOG pen 16 bid a.c.  Victoza 1.2 mg daily at lunch.   Glucometer: Contour. Checking on average about once a day  Blood Glucose readings from monitor for the last 30 days:  PREMEAL Breakfast  2 PM   5 PM  Bedtime Overall  Glucose range:  46-191   125, 161   118-191     Mean/median:  121    141    126    DIET: Usually small portions, may be getting more  carbohydrates like rice sometimes Meals: 2 meals per day usually at 11 a.m. and 7-8 p.m.  Physical activity: exercise: walks 10-15 min daily.  Certified Diabetes Educator visit: Most recent:, 12/12.  Dietician visit: Most recent:, 12/12.   Retinal exam: Most recent:5/12.  Pneumococcal immunization: 12/12.   Wt Readings from Last 3 Encounters:  01/28/14 176 lb 3.2 oz (79.924 kg)  12/02/13 170 lb 1.6 oz (77.157 kg)  12/02/13 170 lb 1.6 oz (77.157 kg)   LABS:  Lab Results  Component Value Date   HGBA1C 7.9* 01/24/2014   HGBA1C 7.0* 10/06/2013   HGBA1C 9.4* 06/09/2013   Lab Results  Component Value Date   MICROALBUR 1.6 01/24/2014   LDLCALC 75 01/24/2014   CREATININE 0.7 01/24/2014     Appointment on 01/24/2014  Component Date Value Ref Range Status  . Hemoglobin A1C 01/24/2014 7.9* 4.6 - 6.5 % Final   Glycemic Control Guidelines for People with Diabetes:Non Diabetic:  <6%Goal of Therapy: <7%Additional Action Suggested:  >8%   . Sodium 01/24/2014 138  135 - 145 mEq/L Final  . Potassium 01/24/2014 4.0  3.5 - 5.1 mEq/L Final  . Chloride 01/24/2014 102  96 - 112 mEq/L Final  . CO2 01/24/2014 29  19 - 32 mEq/L Final  . Glucose, Bld 01/24/2014 177* 70 - 99 mg/dL Final  . BUN 00/93/8182 23  6 - 23 mg/dL Final  . Creatinine, Ser 01/24/2014 0.7  0.4 - 1.2 mg/dL Final  . Total Bilirubin 01/24/2014 0.4  0.2 - 1.2 mg/dL Final  . Alkaline Phosphatase 01/24/2014 125* 39 - 117 U/L Final  . AST 01/24/2014 32  0 - 37 U/L Final  . ALT 01/24/2014 44* 0 - 35 U/L Final  . Total Protein 01/24/2014 7.9  6.0 - 8.3 g/dL Final  . Albumin 16/06/9603 4.1  3.5 - 5.2 g/dL Final  . Calcium 54/05/8118 9.4  8.4 - 10.5 mg/dL Final  . GFR 14/78/2956 93.31  >60.00 mL/min Final  . Cholesterol 01/24/2014 170  0 - 200 mg/dL Final   ATP III Classification       Desirable:  < 200 mg/dL               Borderline High:  200 - 239 mg/dL          High:  > = 213 mg/dL  . Triglycerides 01/24/2014 154.0* 0.0 - 149.0 mg/dL  Final   Normal:  <086 mg/dLBorderline High:  150 - 199 mg/dL  . HDL 01/24/2014 64.10  >39.00 mg/dL Final  . VLDL 57/84/6962 30.8  0.0 - 40.0 mg/dL Final  . LDL Cholesterol 01/24/2014 75  0 - 99 mg/dL Final  . Total CHOL/HDL Ratio 01/24/2014 3   Final                  Men          Women1/2 Average Risk     3.4          3.3Average Risk          5.0          4.42X Average Risk          9.6          7.13X Average Risk          15.0          11.0                      . Microalb, Ur 01/24/2014 1.6  0.0 - 1.9 mg/dL Final  . Creatinine,U 95/28/4132 44.0   Final  . Microalb Creat Ratio 01/24/2014 3.6  0.0 - 30.0 mg/g Final      Medication List       This list is accurate as of: 01/28/14  1:09 PM.  Always use your most recent med list.               atorvastatin 40 MG tablet  Commonly known as:  LIPITOR  TAKE 1 TABLET (40 MG TOTAL) BY MOUTH DAILY.     BAYER CONTOUR NEXT TEST test strip  Generic drug:  glucose blood     buPROPion 300 MG 24 hr tablet  Commonly known as:  WELLBUTRIN XL  Take 300 mg by mouth daily.     Canagliflozin-Metformin HCl [phone] MG Tabs  Take 150-1,000 mg by mouth 2 (two) times daily. HOLD for 2 days. Resume usual dose on Saturday 12/04/2013.     carvedilol 12.5 MG tablet  Commonly known as:  COREG  Take 12.5 mg by mouth 2 (two) times daily with a meal.     EPIPEN 0.3 mg/0.3 mL Devi  Generic drug:  EPINEPHrine  Inject 0.3 mg into the muscle as needed. For allergic reactions     furosemide 20 MG tablet  Commonly known as:  LASIX  Take 20 mg by mouth daily.     insulin aspart 100 UNIT/ML injection  Commonly known as:  NOVOLOG FLEXPEN  Inject 16 Units into the skin 2 (two) times daily before lunch and supper.     Insulin Pen Needle 31G X 5 MM Misc  Commonly known as:  B-D UF III MINI PEN NEEDLES  Use 5 pen needles per day as instructed     irbesartan 300 MG tablet  Commonly known as:  AVAPRO  Take 300 mg by mouth daily.     KLOR-CON M20 20 MEQ  tablet  Generic drug:  potassium chloride SA  TAKE 1 TABLET (20 MEQ TOTAL) BY MOUTH DAILY.     LEVEMIR FLEXPEN 100 UNIT/ML injection  Generic drug:  insulin detemir  Inject 16-22 Units into the skin 2 (two) times daily before lunch and supper. Takes 22 units every morning and 16 units every evening     Liraglutide 18 MG/3ML Sopn  Commonly known as:  VICTOZA  Inject 1.2 mg into the skin daily.     naproxen 500 MG tablet  Commonly known as:  NAPROSYN  Take 500 mg by mouth 2 (two) times daily with a meal.     oxybutynin 5 MG 24 hr tablet  Commonly known as:  DITROPAN-XL  Take 5 mg by mouth daily.        Allergies: No Known Allergies  Past Medical History  Diagnosis Date  . Diabetes mellitus     a. Dx > 5 y ago  . Hypertension   . Hypercholesteremia   . Arthritis   . Cardiomyopathy     a. 05/2012 Echo: EF 30-35%, Gr 1 DD, mild LVH, mild MR, pericardial effusion  . Pericardial effusion     a. 05/2012 Echo: circumferential pericardial effusion, mostly 42mm, largest pocket posterior - 14mm, mild RA indentation, no RV collapse, no tamponade.    Past Surgical History  Procedure Laterality Date  . Abdominal hysterectomy    . Foot surgery      Family History  Problem Relation Age of Onset  . Other      No history of CAD  . Tuberculosis Father     died when pt was young.    Social History:  reports that she has quit smoking. She does not have any smokeless tobacco history on file. She reports that she does not drink alcohol or use illicit drugs.  Review of Systems   Lipids: LDL controlled, taking Lipitor 40 mg, previously had been less compliant  Lab Results  Component Value Date   CHOL 170 01/24/2014   HDL 64.10 01/24/2014   LDLCALC 75 01/24/2014   LDLDIRECT 141.0 06/09/2013   TRIG 154.0* 01/24/2014   CHOLHDL 3 01/24/2014    Hypertension: Has inadequate control, blood pressure again high today; is being treated by PCP   No recent swelling of ankles    Examination:   BP 138/96  Pulse 86  Temp(Src) 97.9 F (36.6 C)  Resp 16  Ht 5\' 3"  (1.6 m)  Wt 176 lb 3.2 oz (79.924 kg)  BMI 31.22 kg/m2  SpO2 94%  Body mass index is 31.22 kg/(m^2).   Assesment:   Diabetes type 2, uncontrolled:   Although her blood sugars are recently averaging about 140 5 in the morning she appears to have a higher A1c Most likely has some postprandial hyperglycemia and is not checking her readings after her evening meal Also has gained weight possibly from inconsistent diet and exercise Not clear why she was having hyperglycemia on waking up last month but this has resolved She  did benefit from adding Invokana to her regimen of insulin and Victoza previously  Hypercholesterolemia: Excellent control with Lipitor  Plan:   She will change evening Levemir insulin to bedtime instead of suppertime, this may improve her fasting readings  Emphasized the need for more readings at bedtime to help adjust her suppertime dose  Increase duration or frequency of walking for exercise and weight loss  Avoid large quantities of carbohydrates  Followup with PCP for hypertension   Reather Littler 01/28/2014, 1:09 PM

## 2014-01-28 NOTE — Patient Instructions (Signed)
More sugars at 10-11 pm  Try Taking Levemir at 11 pm instead of dinner  Walk upto 2x daily

## 2014-02-02 ENCOUNTER — Encounter: Payer: Self-pay | Admitting: Internal Medicine

## 2014-02-02 ENCOUNTER — Ambulatory Visit (INDEPENDENT_AMBULATORY_CARE_PROVIDER_SITE_OTHER): Payer: Federal, State, Local not specified - PPO | Admitting: Internal Medicine

## 2014-02-02 VITALS — BP 129/68 | HR 67 | Ht 63.5 in | Wt 177.1 lb

## 2014-02-02 DIAGNOSIS — I5022 Chronic systolic (congestive) heart failure: Secondary | ICD-10-CM

## 2014-02-02 DIAGNOSIS — I428 Other cardiomyopathies: Secondary | ICD-10-CM | POA: Diagnosis not present

## 2014-02-02 DIAGNOSIS — I447 Left bundle-branch block, unspecified: Secondary | ICD-10-CM | POA: Diagnosis not present

## 2014-02-02 LAB — MDC_IDC_ENUM_SESS_TYPE_INCLINIC
Battery Remaining Longevity: 74.4 mo
Brady Statistic RA Percent Paced: 22 %
Brady Statistic RV Percent Paced: 94 %
Date Time Interrogation Session: 20150520144843
HighPow Impedance: 75.375
Implantable Pulse Generator Serial Number: 7155584
Lead Channel Impedance Value: 525 Ohm
Lead Channel Impedance Value: 600 Ohm
Lead Channel Impedance Value: 837.5 Ohm
Lead Channel Pacing Threshold Amplitude: 0.5 V
Lead Channel Pacing Threshold Amplitude: 0.75 V
Lead Channel Pacing Threshold Amplitude: 1.75 V
Lead Channel Pacing Threshold Amplitude: 2.5 V
Lead Channel Pacing Threshold Amplitude: 2.5 V
Lead Channel Pacing Threshold Pulse Width: 0.5 ms
Lead Channel Pacing Threshold Pulse Width: 0.5 ms
Lead Channel Pacing Threshold Pulse Width: 0.5 ms
Lead Channel Pacing Threshold Pulse Width: 0.5 ms
Lead Channel Pacing Threshold Pulse Width: 1 ms
Lead Channel Sensing Intrinsic Amplitude: 1.6 mV
Lead Channel Sensing Intrinsic Amplitude: 11.8 mV
Lead Channel Setting Pacing Amplitude: 1.75 V
Lead Channel Setting Pacing Amplitude: 2 V
Lead Channel Setting Pacing Amplitude: 2.75 V
Lead Channel Setting Pacing Pulse Width: 0.5 ms
Lead Channel Setting Pacing Pulse Width: 1 ms
Lead Channel Setting Sensing Sensitivity: 0.5 mV
Zone Setting Detection Interval: 250 ms
Zone Setting Detection Interval: 280 ms
Zone Setting Detection Interval: 330 ms

## 2014-02-02 NOTE — Progress Notes (Signed)
Patient Care Team: Renaye Rakers, MD as PCP - General (Family Medicine)   HPI  Linda Lowe is a 63 y.o. female Seen in followup for CRT-D implanted in the setting of nonischemic cardiomyopathy.  She has noted some interval improvement in functional status.  She has some discomfort in her left arm.   Past Medical History  Diagnosis Date  . Diabetes mellitus     a. Dx > 5 y ago  . Hypertension   . Hypercholesteremia   . Arthritis   . Cardiomyopathy     a. 05/2012 Echo: EF 30-35%, Gr 1 DD, mild LVH, mild MR, pericardial effusion  . Pericardial effusion     a. 05/2012 Echo: circumferential pericardial effusion, mostly 29mm, largest pocket posterior - 70mm, mild RA indentation, no RV collapse, no tamponade.    Past Surgical History  Procedure Laterality Date  . Abdominal hysterectomy    . Foot surgery      Current Outpatient Prescriptions  Medication Sig Dispense Refill  . atorvastatin (LIPITOR) 40 MG tablet TAKE 1 TABLET (40 MG TOTAL) BY MOUTH DAILY.  30 tablet  5  . BAYER CONTOUR NEXT TEST test strip       . buPROPion (WELLBUTRIN XL) 300 MG 24 hr tablet Take 300 mg by mouth daily.      . Canagliflozin-Metformin HCl [phone] MG TABS Take 150-1,000 mg by mouth 2 (two) times daily. HOLD for 2 days. Resume usual dose on Saturday 12/04/2013.  60 tablet  5  . carvedilol (COREG) 12.5 MG tablet Take 12.5 mg by mouth 2 (two) times daily with a meal.      . EPINEPHrine (EPIPEN) 0.3 mg/0.3 mL DEVI Inject 0.3 mg into the muscle as needed. For allergic reactions      . furosemide (LASIX) 20 MG tablet Take 20 mg by mouth daily.      . insulin aspart (NOVOLOG FLEXPEN) 100 UNIT/ML injection Inject 16 Units into the skin 2 (two) times daily before lunch and supper.  15 mL  3  . insulin detemir (LEVEMIR FLEXPEN) 100 UNIT/ML injection Inject 16-22 Units into the skin 2 (two) times daily before lunch and supper. Takes 22 units every morning and 16 units every evening      . Insulin Pen  Needle (B-D UF III MINI PEN NEEDLES) 31G X 5 MM MISC Use 5 pen needles per day as instructed  150 each  11  . irbesartan (AVAPRO) 300 MG tablet Take 300 mg by mouth daily.      Marland Kitchen KLOR-CON M20 20 MEQ tablet TAKE 1 TABLET (20 MEQ TOTAL) BY MOUTH DAILY.  30 tablet  3  . Liraglutide 18 MG/3ML SOPN Inject 12.5 mg into the skin daily.      . naproxen (NAPROSYN) 500 MG tablet Take 500 mg by mouth 2 (two) times daily with a meal.      . oxybutynin (DITROPAN-XL) 5 MG 24 hr tablet Take 5 mg by mouth daily.       No current facility-administered medications for this visit.    No Known Allergies  Review of Systems negative except from HPI and PMH  Physical Exam BP 129/68  Pulse 67  Ht 5' 3.5" (1.613 m)  Wt 177 lb 1.9 oz (80.341 kg)  BMI 30.88 kg/m2 Well developed and nourished in no acute distress HENT normal Neck supple with JVP-flat Clear Device pocket well healed; without hematoma or erythema.  There is no tethering  Regular rate and rhythm,  no murmurs or gallops Abd-soft with active BS No Clubbing cyanosis edema Skin-warm and dry A & Oriented  Grossly normal sensory and motor function  ECG sinus rhythm biventricular pacing   Assessment and  Plan  Nonischemic cardiomyopathy  Congestive heart failure-chronic-systolic  Defibrillator-CRT-[employer]  The patient's device was interrogated and the information was fully reviewed.  The device was reprogrammed to shorten the AV delay to enhance ventricular pacing percentages above 94% and to decrease outputs   We will continue current medications

## 2014-02-02 NOTE — Patient Instructions (Addendum)
Remote monitoring is used to monitor your ICD from home. This monitoring reduces the number of office visits required to check your device to one time per year. It allows [place] to keep an eye on the functioning of your device to ensure it is working properly. You are scheduled for a device check from home on 05-09-2014. You may send your transmission at any time that day. If you have a wireless device, the transmission will be sent automatically. After your physician reviews your transmission, you will receive a postcard with your next transmission date.  Your physician recommends that you schedule a follow-up appointment in: March 2016 with Dr.Klein

## 2014-02-04 ENCOUNTER — Encounter: Payer: Self-pay | Admitting: Internal Medicine

## 2014-02-14 DIAGNOSIS — I428 Other cardiomyopathies: Secondary | ICD-10-CM | POA: Diagnosis not present

## 2014-02-14 DIAGNOSIS — I509 Heart failure, unspecified: Secondary | ICD-10-CM | POA: Diagnosis not present

## 2014-02-14 DIAGNOSIS — Z95810 Presence of automatic (implantable) cardiac defibrillator: Secondary | ICD-10-CM | POA: Diagnosis not present

## 2014-02-14 DIAGNOSIS — I5042 Chronic combined systolic (congestive) and diastolic (congestive) heart failure: Secondary | ICD-10-CM | POA: Diagnosis not present

## 2014-02-17 ENCOUNTER — Other Ambulatory Visit (HOSPITAL_COMMUNITY): Payer: Self-pay | Admitting: Family Medicine

## 2014-02-17 DIAGNOSIS — Z1231 Encounter for screening mammogram for malignant neoplasm of breast: Secondary | ICD-10-CM

## 2014-02-22 ENCOUNTER — Ambulatory Visit (HOSPITAL_COMMUNITY)
Admission: RE | Admit: 2014-02-22 | Discharge: 2014-02-22 | Disposition: A | Discharge status: Home / Self Care | Payer: Federal, State, Local not specified - PPO | Source: Ambulatory Visit | Attending: Family Medicine | Admitting: Family Medicine

## 2014-02-22 DIAGNOSIS — Z1231 Encounter for screening mammogram for malignant neoplasm of breast: Secondary | ICD-10-CM

## 2014-03-01 ENCOUNTER — Telehealth: Payer: Self-pay | Admitting: Internal Medicine

## 2014-03-01 NOTE — Telephone Encounter (Signed)
New message     Patient needs help with her device box.  She is crying because she is frustrated.  Please call

## 2014-03-01 NOTE — Telephone Encounter (Signed)
Attempted to return pt call x1 no answer and unable to leave VM

## 2014-03-02 NOTE — Telephone Encounter (Signed)
Spoke with pt and pt stated that he spoke with someone on Tuesday and that they would send a filter to him in mail.

## 2014-03-04 ENCOUNTER — Telehealth: Payer: Self-pay | Admitting: Internal Medicine

## 2014-03-04 NOTE — Telephone Encounter (Signed)
New Message  Pt called states that she has received the piece that was missing from her device. Requests a call back to fix//SR

## 2014-03-04 NOTE — Telephone Encounter (Signed)
Explained to pt on how to plug filter into wall and monitor.

## 2014-03-07 ENCOUNTER — Encounter: Payer: Federal, State, Local not specified - PPO | Admitting: Internal Medicine

## 2014-03-18 ENCOUNTER — Other Ambulatory Visit: Payer: Self-pay | Admitting: Endocrinology

## 2014-03-21 ENCOUNTER — Other Ambulatory Visit: Payer: Self-pay | Admitting: *Deleted

## 2014-03-21 MED ORDER — LIRAGLUTIDE 18 MG/3ML ~~LOC~~ SOPN: PEN_INJECTOR | SUBCUTANEOUS | Status: DC

## 2014-03-21 MED ORDER — INSULIN ASPART 100 UNIT/ML ~~LOC~~ SOLN: 16.0000 [IU] | Freq: Two times a day (BID) | SUBCUTANEOUS | Status: DC

## 2014-04-18 DIAGNOSIS — E1139 Type 2 diabetes mellitus with other diabetic ophthalmic complication: Secondary | ICD-10-CM | POA: Diagnosis not present

## 2014-04-25 ENCOUNTER — Other Ambulatory Visit (INDEPENDENT_AMBULATORY_CARE_PROVIDER_SITE_OTHER): Payer: Federal, State, Local not specified - PPO

## 2014-04-25 DIAGNOSIS — E1165 Type 2 diabetes mellitus with hyperglycemia: Principal | ICD-10-CM

## 2014-04-25 DIAGNOSIS — IMO0001 Reserved for inherently not codable concepts without codable children: Secondary | ICD-10-CM | POA: Diagnosis not present

## 2014-04-25 LAB — BASIC METABOLIC PANEL
BUN: 20 mg/dL (ref 6–23)
CO2: 25 mEq/L (ref 19–32)
Calcium: 9.5 mg/dL (ref 8.4–10.5)
Chloride: 102 mEq/L (ref 96–112)
Creatinine, Ser: 0.8 mg/dL (ref 0.4–1.2)
GFR: 73.06 mL/min (ref >60.00)
Glucose, Bld: 205 mg/dL — ABNORMAL HIGH (ref 70–99)
Potassium: 4.2 mEq/L (ref 3.5–5.1)
Sodium: 137 mEq/L (ref 135–145)

## 2014-04-25 LAB — HEMOGLOBIN A1C: Hgb A1c MFr Bld: 8.5 % — ABNORMAL HIGH (ref 4.6–6.5)

## 2014-04-28 ENCOUNTER — Ambulatory Visit (INDEPENDENT_AMBULATORY_CARE_PROVIDER_SITE_OTHER): Payer: Federal, State, Local not specified - PPO | Admitting: Endocrinology

## 2014-04-28 ENCOUNTER — Encounter: Payer: Self-pay | Admitting: Endocrinology

## 2014-04-28 VITALS — BP 122/74 | HR 87 | Temp 97.9°F | Resp 16 | Ht 63.0 in | Wt 180.8 lb

## 2014-04-28 DIAGNOSIS — IMO0001 Reserved for inherently not codable concepts without codable children: Secondary | ICD-10-CM

## 2014-04-28 DIAGNOSIS — I10 Essential (primary) hypertension: Secondary | ICD-10-CM

## 2014-04-28 DIAGNOSIS — E785 Hyperlipidemia, unspecified: Secondary | ICD-10-CM

## 2014-04-28 DIAGNOSIS — E1165 Type 2 diabetes mellitus with hyperglycemia: Principal | ICD-10-CM

## 2014-04-28 NOTE — Patient Instructions (Addendum)
Levemir /greenfor sleep pen 24 units on waking up AND 14 at dinner.   NOVOLOG (orange) take 18 units before meals  Victoza 1.8mg  daily  Walk daily  Please check blood sugars at least half the time about 2 hours after any meal and 3 times per week on waking up. Please bring blood sugar monitor to each visit Call if sugar getting below 65 over once a week

## 2014-04-28 NOTE — Progress Notes (Addendum)
Patient ID: Linda Lowe, female   DOB: 1951/07/27, 63 y.o.   MRN: [id number]  Reason for Appointment: Diabetes follow-up   History of Present Illness   Diagnosis: date of diagnosis: ? 2009  PAST history: Her diabetes has been usually poorly controlled. She has been on multiple medications including Victoza and basal bolus regimen also. However she  had difficulty with consistent compliance with insulin, diet and exercise regimen. With starting Victoza her blood sugars appeared to be improved. After repeated sessions with educators she was able to improve her compliance level and blood sugars had been improving since about 10/13. Insulin doses have been increased overall  A1c previously had been usually over 8% and below this for the first time in 2/14. She had been switched to a combination of Invokana and metformin instead of Janumet in late 2014  RECENT history:  In 1/15 her blood sugars are excellent with A1c 7% However since then her blood sugars are gradually increasing rising A1c and it is now 8.5 Difficult to assess her blood sugar pattern since she did not bring her monitor today Her blood sugars appear to be variable by history especially in the morning when she can sometimes have a low sugar However her reading after breakfast was high in the lab Also she thinks her sugars may be higher after supper Hypoglycemia: Recently had a 53 glucose in the morning and she thinks her sugar when download after she had her lab work in the office She again has gained weight  Also taking Victoza as directed at her first meal She believes she is taking her insulin as directed and of NovoLog before her meals; usually not eating a snack in between meals  Current insulin regimen: Levemir 22 units AT 9 AM AND 16 at supper. NOVOLOG pen 16 bid a.c.  Victoza 1.2 mg daily at lunch.   Glucometer: Contour. Checking on average about once a day  Blood Glucose readings from monitor download Am: [id number] with  an average of 135, p.c. breakfast 50, 58 and 8 PM = 137  DIET: Usually small portions, may be getting more carbohydrates like rice sometimes Meals: 2 meals per day usually at 11 a.m. and 7-8 p.m.  Physical activity: exercise: walks 10-15 min at times.  Certified Diabetes Educator visit: Most recent:, 12/12.  Dietician visit: Most recent:, 12/12.   Retinal exam: Most recent:5/12.  Pneumococcal immunization: 12/12.   Wt Readings from Last 3 Encounters:  04/28/14 180 lb 12.8 oz (82.01 kg)  02/02/14 177 lb 1.9 oz (80.341 kg)  01/28/14 176 lb 3.2 oz (79.924 kg)   LABS:  Lab Results  Component Value Date   HGBA1C 8.5* 04/25/2014   HGBA1C 7.9* 01/24/2014   HGBA1C 7.0* 10/06/2013   Lab Results  Component Value Date   MICROALBUR 1.6 01/24/2014   LDLCALC 75 01/24/2014   CREATININE 0.8 04/25/2014     Appointment on 04/25/2014  Component Date Value Ref Range Status  . Hemoglobin A1C 04/25/2014 8.5* 4.6 - 6.5 % Final   Glycemic Control Guidelines for People with Diabetes:Non Diabetic:  <6%Goal of Therapy: <7%Additional Action Suggested:  >8%   . Sodium 04/25/2014 137  135 - 145 mEq/L Final  . Potassium 04/25/2014 4.2  3.5 - 5.1 mEq/L Final  . Chloride 04/25/2014 102  96 - 112 mEq/L Final  . CO2 04/25/2014 25  19 - 32 mEq/L Final  . Glucose, Bld 04/25/2014 205* 70 - 99 mg/dL Final  . BUN 65/99/3570 20  6 -  23 mg/dL Final  . Creatinine, Ser 04/25/2014 0.8  0.4 - 1.2 mg/dL Final  . Calcium 40/04/6760 9.5  8.4 - 10.5 mg/dL Final  . GFR 95/05/3266 73.06  >60.00 mL/min Final      Medication List       This list is accurate as of: 04/28/14  1:33 PM.  Always use your most recent med list.               atorvastatin 40 MG tablet  Commonly known as:  LIPITOR  TAKE 1 TABLET (40 MG TOTAL) BY MOUTH DAILY.     BAYER CONTOUR NEXT TEST test strip  Generic drug:  glucose blood     buPROPion 300 MG 24 hr tablet  Commonly known as:  WELLBUTRIN XL  Take 300 mg by mouth daily.      Canagliflozin-Metformin HCl [phone] MG Tabs  Take 150-1,000 mg by mouth 2 (two) times daily. HOLD for 2 days. Resume usual dose on Saturday 12/04/2013.     carvedilol 12.5 MG tablet  Commonly known as:  COREG  Take 12.5 mg by mouth 2 (two) times daily with a meal.     EPIPEN 0.3 mg/0.3 mL Devi  Generic drug:  EPINEPHrine  Inject 0.3 mg into the muscle as needed. For allergic reactions     furosemide 20 MG tablet  Commonly known as:  LASIX  Take 20 mg by mouth daily.     insulin aspart 100 UNIT/ML injection  Commonly known as:  NOVOLOG FLEXPEN  Inject 16 Units into the skin 2 (two) times daily before lunch and supper.     Insulin Pen Needle 31G X 5 MM Misc  Commonly known as:  B-D UF III MINI PEN NEEDLES  Use 5 pen needles per day as instructed     irbesartan 300 MG tablet  Commonly known as:  AVAPRO  Take 300 mg by mouth daily.     KLOR-CON M20 20 MEQ tablet  Generic drug:  potassium chloride SA  TAKE 1 TABLET (20 MEQ TOTAL) BY MOUTH DAILY.     LEVEMIR FLEXPEN 100 UNIT/ML injection  Generic drug:  insulin detemir  Inject 16-22 Units into the skin 2 (two) times daily before lunch and supper. Takes 22 units every morning and 16 units every evening     Liraglutide 18 MG/3ML Sopn  Inject 1.2 mg daily     naproxen 500 MG tablet  Commonly known as:  NAPROSYN  Take 500 mg by mouth 2 (two) times daily with a meal.     oxybutynin 5 MG 24 hr tablet  Commonly known as:  DITROPAN-XL  Take 5 mg by mouth daily.        Allergies: No Known Allergies  Past Medical History  Diagnosis Date  . Diabetes mellitus     a. Dx > 5 y ago  . Hypertension   . Hypercholesteremia   . Arthritis   . Cardiomyopathy     a. 05/2012 Echo: EF 30-35%, Gr 1 DD, mild LVH, mild MR, pericardial effusion  . Pericardial effusion     a. 05/2012 Echo: circumferential pericardial effusion, mostly 10mm, largest pocket posterior - 40mm, mild RA indentation, no RV collapse, no tamponade.    Past Surgical  History  Procedure Laterality Date  . Abdominal hysterectomy    . Foot surgery      Family History  Problem Relation Age of Onset  . Other      No history of CAD  . Tuberculosis  Father     died when pt was young.    Social History:  reports that she has quit smoking. She does not have any smokeless tobacco history on file. She reports that she does not drink alcohol or use illicit drugs.  Review of Systems   Lipids: LDL controlled, taking Lipitor 40 mg, previously had been less compliant  Lab Results  Component Value Date   CHOL 170 01/24/2014   HDL 64.10 01/24/2014   LDLCALC 75 01/24/2014   LDLDIRECT 141.0 06/09/2013   TRIG 154.0* 01/24/2014   CHOLHDL 3 01/24/2014    Hypertension: Has inadequate control, blood pressure was initially high but better with the large cuff. Also follows with PCP  No recent numbness in her feet is   Examination:   BP 122/74  Pulse 87  Temp(Src) 97.9 F (36.6 C)  Resp 16  Ht 5\' 3"  (1.6 m)  Wt 180 lb 12.8 oz (82.01 kg)  BMI 32.04 kg/m2  SpO2 96%  Body mass index is 32.04 kg/(m^2).   Assesment:   Diabetes type 2, uncontrolled:   Her blood sugars are somewhat difficult to assess as she did not bring her monitor She is requiring relatively large amounts of mealtime insulin despite taking the collar She will bring her monitor tomorrow and this will be reviewed Also will need to make sure she is taking all her oral medications as directed and she will bring them for review   Consider switching to Toujeo  Encouraged her to start walking more regularly  She needs to watch her carbohydrates and she will probably need a followup with diabetes educator for meal planning  Meanwhile will increase her Victoza dose to 1.8 mg  Hypertension: Blood pressure appears to be fairly good today and she will also followup with PCP  Detailed instructions in patient instructions section  Counseling time over 50% of today's 25 minute  visit   Nickey Canedo 04/28/2014, 1:33 PM   8/14: Addendum Patient brought her monitor her today and appears to have usually good readings in the mornings averaging 135 and relatively low readings after breakfast. Has only one reading at suppertime She will need to do at least half of her readings after lunch and supper and followup in 2 weeks without any increase in insulin Reduce morning NovoLog to 14, followup in 4 weeks

## 2014-04-29 ENCOUNTER — Telehealth: Payer: Self-pay | Admitting: Endocrinology

## 2014-04-29 NOTE — Telephone Encounter (Signed)
Blood sugar monitor shows readings mostly before breakfast. She needs to check his sugar in the morning only every other day but at least once a day check sugar either before or 2 hours after dinner and some readings after breakfast Not to change the insulin as discussed yesterday but only increase the dose to 1.8 Also she would reduce her morning NovoLog to 14 units instead of the 16 she is taking She will see me back in 4 weeks instead of 6

## 2014-04-29 NOTE — Telephone Encounter (Signed)
Noted, patient is aware. 

## 2014-05-09 ENCOUNTER — Encounter: Payer: Federal, State, Local not specified - PPO | Admitting: *Deleted

## 2014-05-10 ENCOUNTER — Other Ambulatory Visit: Payer: Self-pay | Admitting: Endocrinology

## 2014-05-17 ENCOUNTER — Encounter: Payer: Federal, State, Local not specified - PPO | Admitting: Nutrition

## 2014-05-24 ENCOUNTER — Encounter: Payer: Federal, State, Local not specified - PPO | Admitting: Nutrition

## 2014-05-30 ENCOUNTER — Encounter: Payer: Federal, State, Local not specified - PPO | Attending: Endocrinology | Admitting: Nutrition

## 2014-05-30 DIAGNOSIS — E1165 Type 2 diabetes mellitus with hyperglycemia: Secondary | ICD-10-CM | POA: Diagnosis not present

## 2014-05-30 DIAGNOSIS — E118 Type 2 diabetes mellitus with unspecified complications: Secondary | ICD-10-CM

## 2014-05-30 DIAGNOSIS — IMO0002 Reserved for concepts with insufficient information to code with codable children: Secondary | ICD-10-CM | POA: Diagnosis not present

## 2014-06-01 NOTE — Patient Instructions (Addendum)
Walk for 20-25 min. 5 days per week. Test blood sugar before supper one day and 2hr. After supper one day. Increase walking to 10 min. 2 or 3 times/day

## 2014-06-01 NOTE — Progress Notes (Signed)
This patient is here to review diet and blood sugar readings.  Diet:  6AM:  Diet pepsie          6:30AM:  Coffee with 1 tsp. Of sugar and CoffeeMate          11AM: noodes or rice with pork and vegetables, with diet pepsie to dirnk          4-5PM: 1 package of nabs(6 crackers         7PM: Meat, (4-6 ounces), with rice or noodles, or 1 starchy veg. And one nonstarchy veg.          Denies eating anything after super  Exercise: walks around the block (10 min) once a day.  Insulin:  Levemir: 24u and can remember how much she take at HS              Novolog: she taking she takes 14u acL and 16u acS.    Strongly encouraged more walking--twice a day around the block to equal at least 20 min. A day  She agreed to do this.  SBGM:  She did not bring her meter.  Did not test her blood sugar this AM, and does not remember what it was yesterday.  She is only testing acB.  Stressed the need to test acS or 2 hr. pcS and she agreed to do this.   I promised to call her on Monday for those blood sugar readings.

## 2014-06-06 ENCOUNTER — Other Ambulatory Visit (INDEPENDENT_AMBULATORY_CARE_PROVIDER_SITE_OTHER): Payer: Federal, State, Local not specified - PPO

## 2014-06-06 ENCOUNTER — Other Ambulatory Visit: Payer: Self-pay | Admitting: *Deleted

## 2014-06-06 DIAGNOSIS — E785 Hyperlipidemia, unspecified: Secondary | ICD-10-CM

## 2014-06-06 DIAGNOSIS — IMO0001 Reserved for inherently not codable concepts without codable children: Secondary | ICD-10-CM

## 2014-06-06 DIAGNOSIS — E1165 Type 2 diabetes mellitus with hyperglycemia: Principal | ICD-10-CM

## 2014-06-06 LAB — COMPREHENSIVE METABOLIC PANEL
ALT: 23 U/L (ref 0–35)
AST: 23 U/L (ref 0–37)
Albumin: 4.2 g/dL (ref 3.5–5.2)
Alkaline Phosphatase: 128 U/L — ABNORMAL HIGH (ref 39–117)
BUN: 28 mg/dL — ABNORMAL HIGH (ref 6–23)
CO2: 29 mEq/L (ref 19–32)
Calcium: 9.6 mg/dL (ref 8.4–10.5)
Chloride: 103 mEq/L (ref 96–112)
Creatinine, Ser: 0.8 mg/dL (ref 0.4–1.2)
GFR: 73.03 mL/min (ref >60.00)
Glucose, Bld: 194 mg/dL — ABNORMAL HIGH (ref 70–99)
Potassium: 4.2 mEq/L (ref 3.5–5.1)
Sodium: 138 mEq/L (ref 135–145)
Total Bilirubin: 0.2 mg/dL (ref 0.2–1.2)
Total Protein: 8.4 g/dL — ABNORMAL HIGH (ref 6.0–8.3)

## 2014-06-06 LAB — LIPID PANEL
Cholesterol: 162 mg/dL (ref 0–200)
HDL: 48.3 mg/dL (ref >39.00)
LDL Cholesterol: 85 mg/dL (ref 0–99)
NonHDL: 113.7
Total CHOL/HDL Ratio: 3
Triglycerides: 146 mg/dL (ref 0.0–149.0)
VLDL: 29.2 mg/dL (ref 0.0–40.0)

## 2014-06-06 LAB — HEMOGLOBIN A1C: Hgb A1c MFr Bld: 8.1 % — ABNORMAL HIGH (ref 4.6–6.5)

## 2014-06-09 ENCOUNTER — Encounter: Payer: Self-pay | Admitting: Endocrinology

## 2014-06-09 ENCOUNTER — Ambulatory Visit (INDEPENDENT_AMBULATORY_CARE_PROVIDER_SITE_OTHER): Payer: Federal, State, Local not specified - PPO | Admitting: Endocrinology

## 2014-06-09 VITALS — BP 144/90 | HR 80 | Temp 98.2°F | Wt 178.0 lb

## 2014-06-09 DIAGNOSIS — IMO0001 Reserved for inherently not codable concepts without codable children: Secondary | ICD-10-CM | POA: Diagnosis not present

## 2014-06-09 DIAGNOSIS — Z23 Encounter for immunization: Secondary | ICD-10-CM | POA: Diagnosis not present

## 2014-06-09 DIAGNOSIS — I10 Essential (primary) hypertension: Secondary | ICD-10-CM

## 2014-06-09 DIAGNOSIS — E785 Hyperlipidemia, unspecified: Secondary | ICD-10-CM | POA: Diagnosis not present

## 2014-06-09 DIAGNOSIS — E1165 Type 2 diabetes mellitus with hyperglycemia: Principal | ICD-10-CM

## 2014-06-09 NOTE — Patient Instructions (Signed)
Reduce rice to 3/4 cup  Orange pen Novolog: 14 before am meal 14 units and DINNER 16 UNITS  WALK DAILY  Please check blood sugars at least half the time about 2 hours after any meal and 3 times per week on waking up. Please bring blood sugar monitor to each visit

## 2014-06-09 NOTE — Progress Notes (Signed)
Patient ID: Linda Lowe, female   DOB: [birth year], 63 y.o.   MRN: [id number]   Reason for Appointment: Diabetes follow-up   History of Present Illness   Diagnosis: date of diagnosis: ? 2009  PAST history: Her diabetes has been usually poorly controlled. She has been on multiple medications including Victoza and basal bolus regimen also. However she  had difficulty with consistent compliance with insulin, diet and exercise regimen. With starting Victoza her blood sugars appeared to be improved. After repeated sessions with educators she was able to improve her compliance level and blood sugars had been improving since about 10/13. Insulin doses have been increased overall  A1c previously had been usually over 8% and below this for the first time in 2/14. She had been switched to a combination of Invokana and metformin instead of Janumet in late 2014 In 1/15 her blood sugars are excellent with A1c 7%  RECENT history:   In 2015 her blood sugars have been gradually more difficult to control with rising A1c and recently over 8% Difficult to assess her blood sugar pattern since she mostly checks her blood sugars in the mornings and also not very often On her last visit her fasting reading was fairly good, averaging about 135 and these are still fairly good She has only one reading after dinner in the last month and this was high Has relatively higher readings at times in the afternoons She does eat a whole cup of rice with breakfast and dinner and likely has postprandial hyperglycemia which she does not document This is despite taking her Victoza which she thinks she is compliant with; this has been increased to 1.8 mg now Her weight is down 2 pounds Hypoglycemia: Minimal with only one reading of 69 before her first meal She believes she is taking all her insulin doses as directed including NovoLog before her meals. All her injectable regimen was verified in detail  Current insulin regimen:  Levemir 22 units AT 9 AM AND 14 at supper. NOVOLOG pen 14 bid a.c.  Victoza 1.8 mg daily at lunch.   Glucometer: Contour. Has only 6 readings in the last 2 weeks, previous readings are mostly in the morning  Blood Glucose readings from monitor download  PREMEAL Breakfast Lunch Dinner Bedtime Overall  Glucose range: 69-148  101-173  205    Mean/median:     147   DIET: Usually small portions, may be getting more carbohydrates like rice sometimes Meals: 2 meals per day usually at 11 a.m. and 7-8 p.m.  Physical activity: exercise: walks 10-15 min every 2 days Certified Diabetes Educator visit: Most recent:, 12/12.  Dietician visit: Most recent:, 12/12.   Retinal exam: Most recent:5/12.  Pneumococcal immunization: 12/12.   Wt Readings from Last 3 Encounters:  06/09/14 178 lb (80.74 kg)  04/28/14 180 lb 12.8 oz (82.01 kg)  02/02/14 177 lb 1.9 oz (80.341 kg)   LABS:  Lab Results  Component Value Date   HGBA1C 8.1* 06/06/2014   HGBA1C 8.5* 04/25/2014   HGBA1C 7.9* 01/24/2014   Lab Results  Component Value Date   MICROALBUR 1.6 01/24/2014   LDLCALC 85 06/06/2014   CREATININE 0.8 06/06/2014     Appointment on 06/06/2014  Component Date Value Ref Range Status  . Hemoglobin A1C 06/06/2014 8.1* 4.6 - 6.5 % Final   Glycemic Control Guidelines for People with Diabetes:Non Diabetic:  <6%Goal of Therapy: <7%Additional Action Suggested:  >8%   . Sodium 06/06/2014 138  135 - 145 mEq/L  Final  . Potassium 06/06/2014 4.2  3.5 - 5.1 mEq/L Final  . Chloride 06/06/2014 103  96 - 112 mEq/L Final  . CO2 06/06/2014 29  19 - 32 mEq/L Final  . Glucose, Bld 06/06/2014 194* 70 - 99 mg/dL Final  . BUN 74/04/1447 28* 6 - 23 mg/dL Final  . Creatinine, Ser 06/06/2014 0.8  0.4 - 1.2 mg/dL Final  . Total Bilirubin 06/06/2014 0.2  0.2 - 1.2 mg/dL Final  . Alkaline Phosphatase 06/06/2014 128* 39 - 117 U/L Final  . AST 06/06/2014 23  0 - 37 U/L Final  . ALT 06/06/2014 23  0 - 35 U/L Final  . Total Protein  06/06/2014 8.4* 6.0 - 8.3 g/dL Final  . Albumin 18/56/3149 4.2  3.5 - 5.2 g/dL Final  . Calcium 70/26/3785 9.6  8.4 - 10.5 mg/dL Final  . GFR 88/50/2774 73.03  >60.00 mL/min Final  . Cholesterol 06/06/2014 162  0 - 200 mg/dL Final   ATP III Classification       Desirable:  < 200 mg/dL               Borderline High:  200 - 239 mg/dL          High:  > = 128 mg/dL  . Triglycerides 06/06/2014 146.0  0.0 - 149.0 mg/dL Final   Normal:  <786 mg/dLBorderline High:  150 - 199 mg/dL  . HDL 06/06/2014 48.30  >39.00 mg/dL Final  . VLDL 76/72/0947 29.2  0.0 - 40.0 mg/dL Final  . LDL Cholesterol 06/06/2014 85  0 - 99 mg/dL Final  . Total CHOL/HDL Ratio 06/06/2014 3   Final                  Men          Women1/2 Average Risk     3.4          3.3Average Risk          5.0          4.42X Average Risk          9.6          7.13X Average Risk          15.0          11.0                      . NonHDL 06/06/2014 113.70   Final   NOTE:  Non-HDL goal should be 30 mg/dL higher than patient's LDL goal (i.e. LDL goal of < 70 mg/dL, would have non-HDL goal of < 100 mg/dL)      Medication List       This list is accurate as of: 06/09/14  5:03 PM.  Always use your most recent med list.               atorvastatin 40 MG tablet  Commonly known as:  LIPITOR  TAKE 1 TABLET (40 MG TOTAL) BY MOUTH DAILY.     BAYER CONTOUR NEXT TEST test strip  Generic drug:  glucose blood     buPROPion 300 MG 24 hr tablet  Commonly known as:  WELLBUTRIN XL  Take 300 mg by mouth daily.     Canagliflozin-Metformin HCl [id number] MG Tabs  Take 150-1,000 mg by mouth 2 (two) times daily. HOLD for 2 days. Resume usual dose on Saturday 12/04/2013.     carvedilol 12.5 MG tablet  Commonly known as:  COREG  Take 12.5 mg by mouth 2 (two) times daily with a meal.     EPIPEN 0.3 mg/0.3 mL Devi  Generic drug:  EPINEPHrine  Inject 0.3 mg into the muscle as needed. For allergic reactions     furosemide 20 MG tablet  Commonly known as:   LASIX  Take 20 mg by mouth daily.     insulin aspart 100 UNIT/ML injection  Commonly known as:  NOVOLOG FLEXPEN  Inject 16 Units into the skin 2 (two) times daily before lunch and supper.     Insulin Pen Needle 31G X 5 MM Misc  Commonly known as:  B-D UF III MINI PEN NEEDLES  Use 5 pen needles per day as instructed     irbesartan 300 MG tablet  Commonly known as:  AVAPRO  Take 300 mg by mouth daily.     KLOR-CON M20 20 MEQ tablet  Generic drug:  potassium chloride SA  TAKE 1 TABLET (20 MEQ TOTAL) BY MOUTH DAILY.     LEVEMIR FLEXPEN 100 UNIT/ML injection  Generic drug:  insulin detemir  Inject 16-22 Units into the skin 2 (two) times daily before lunch and supper. Takes 22 units every morning and 16 units every evening     Liraglutide 18 MG/3ML Sopn  Inject 1.2 mg daily     naproxen 500 MG tablet  Commonly known as:  NAPROSYN  Take 500 mg by mouth 2 (two) times daily with a meal.     oxybutynin 5 MG 24 hr tablet  Commonly known as:  DITROPAN-XL  Take 5 mg by mouth daily.        Allergies: No Known Allergies  Past Medical History  Diagnosis Date  . Diabetes mellitus     a. Dx > 5 y ago  . Hypertension   . Hypercholesteremia   . Arthritis   . Cardiomyopathy     a. 05/2012 Echo: EF 30-35%, Gr 1 DD, mild LVH, mild MR, pericardial effusion  . Pericardial effusion     a. 05/2012 Echo: circumferential pericardial effusion, mostly 51mm, largest pocket posterior - 31mm, mild RA indentation, no RV collapse, no tamponade.    Past Surgical History  Procedure Laterality Date  . Abdominal hysterectomy    . Foot surgery      Family History  Problem Relation Age of Onset  . Other      No history of CAD  . Tuberculosis Father     died when pt was young.    Social History:  reports that she has quit smoking. She does not have any smokeless tobacco history on file. She reports that she does not drink alcohol or use illicit drugs.  Review of Systems   Lipids: LDL  controlled, taking Lipitor 40 mg   Lab Results  Component Value Date   CHOL 162 06/06/2014   HDL 48.30 06/06/2014   LDLCALC 85 06/06/2014   LDLDIRECT 141.0 06/09/2013   TRIG 146.0 06/06/2014   CHOLHDL 3 06/06/2014    Hypertension: Has inadequate control, she follows with PCP  No tingling or numbness in feet  Examination:   BP 144/90  Pulse 80  Temp(Src) 98.2 F (36.8 C)  Wt 178 lb (80.74 kg)  SpO2 98%  Body mass index is 31.54 kg/(m^2).   Assesment:   Diabetes type 2, uncontrolled:   Her blood sugars are somewhat difficult to assess as she checks blood sugars mostly in the mornings and also overall infrequently Appears to have relatively high postprandial readings including  on the lab work and once after dinner She has relatively high carbohydrate meals with a cup of rice at least with her first meal; discussed reducing portions Since increasing her Victoza she is starting to lose weight but A1c is only slightly better She will continue her insulin doses unchanged except 2 more units at supper time of NovoLog Discussed compliance with all her insulin doses Improve exercise regimen and glucose monitoring as directed  Hypertension: Blood pressure appears to be higher today, needs to followup with PCP  Patient Instructions  Reduce rice to 3/4 cup  Orange pen Novolog: 14 before am meal 14 units and DINNER 16 UNITS  WALK DAILY  Please check blood sugars at least half the time about 2 hours after any meal and 3 times per week on waking up. Please bring blood sugar monitor to each visit   Counseling time over 50% of today's 25 minute visit  Allisa Einspahr 06/09/2014, 5:03 PM

## 2014-06-09 NOTE — Progress Notes (Signed)
Pre visit review using our clinic review tool, if applicable. No additional management support is needed unless otherwise documented below in the visit note. 

## 2014-07-22 ENCOUNTER — Other Ambulatory Visit: Payer: Self-pay | Admitting: Endocrinology

## 2014-07-23 ENCOUNTER — Other Ambulatory Visit: Payer: Self-pay | Admitting: Endocrinology

## 2014-08-08 ENCOUNTER — Other Ambulatory Visit: Payer: Self-pay | Admitting: Endocrinology

## 2014-08-10 ENCOUNTER — Other Ambulatory Visit: Payer: Self-pay | Admitting: Endocrinology

## 2014-08-19 ENCOUNTER — Other Ambulatory Visit: Payer: Self-pay | Admitting: Endocrinology

## 2014-08-25 ENCOUNTER — Encounter (HOSPITAL_COMMUNITY): Payer: Self-pay | Admitting: Cardiovascular Disease

## 2014-08-31 ENCOUNTER — Other Ambulatory Visit (INDEPENDENT_AMBULATORY_CARE_PROVIDER_SITE_OTHER): Payer: Federal, State, Local not specified - PPO

## 2014-08-31 DIAGNOSIS — IMO0002 Reserved for concepts with insufficient information to code with codable children: Secondary | ICD-10-CM

## 2014-08-31 DIAGNOSIS — E1165 Type 2 diabetes mellitus with hyperglycemia: Secondary | ICD-10-CM

## 2014-08-31 LAB — BASIC METABOLIC PANEL
BUN: 20 mg/dL (ref 6–23)
CO2: 26 mEq/L (ref 19–32)
Calcium: 9.3 mg/dL (ref 8.4–10.5)
Chloride: 105 mEq/L (ref 96–112)
Creatinine, Ser: 0.7 mg/dL (ref 0.4–1.2)
GFR: 85.8 mL/min (ref >60.00)
Glucose, Bld: 165 mg/dL — ABNORMAL HIGH (ref 70–99)
Potassium: 4.3 mEq/L (ref 3.5–5.1)
Sodium: 138 mEq/L (ref 135–145)

## 2014-08-31 LAB — HEMOGLOBIN A1C: Hgb A1c MFr Bld: 8.8 % — ABNORMAL HIGH (ref 4.6–6.5)

## 2014-09-05 ENCOUNTER — Ambulatory Visit (INDEPENDENT_AMBULATORY_CARE_PROVIDER_SITE_OTHER): Payer: Federal, State, Local not specified - PPO | Admitting: Endocrinology

## 2014-09-05 ENCOUNTER — Encounter: Payer: Self-pay | Admitting: Endocrinology

## 2014-09-05 VITALS — BP 146/78 | HR 86 | Temp 98.2°F | Resp 16 | Ht 63.0 in | Wt 179.4 lb

## 2014-09-05 DIAGNOSIS — IMO0002 Reserved for concepts with insufficient information to code with codable children: Secondary | ICD-10-CM

## 2014-09-05 DIAGNOSIS — I10 Essential (primary) hypertension: Secondary | ICD-10-CM

## 2014-09-05 DIAGNOSIS — E1165 Type 2 diabetes mellitus with hyperglycemia: Secondary | ICD-10-CM

## 2014-09-05 NOTE — Progress Notes (Signed)
Patient ID: Linda Lowe, female   DOB: [birth year], 63 y.o.   MRN: [id number]   Reason for Appointment: Diabetes follow-up   History of Present Illness   Diagnosis: date of diagnosis: ? 2009  PAST history: Her diabetes has been usually poorly controlled. She has been on multiple medications including Victoza and basal bolus regimen also. However she  had difficulty with consistent compliance with insulin, diet and exercise regimen. With starting Victoza her blood sugars appeared to be improved. After repeated sessions with educators she was able to improve her compliance level and blood sugars had been improving since about 10/13. Insulin doses have been increased overall  A1c previously had been usually over 8% and below this for the first time in 2/14. She had been switched to a combination of Invokana and metformin instead of Janumet in late 2014 In 1/15 her blood sugars are excellent with A1c 7%  RECENT history:  In 2015 her blood sugars have been gradually more difficult to control with rising A1c and persistently over 8% Difficult to assess her blood sugar pattern since she is using a new glucose monitor now for the last 6 weeks and it has the wrong date programmed on it.  However her lab glucose was 165 fasting History is somewhat difficult to obtain because of her language difficulty Again she is checking blood sugars mostly in the mornings and not many readings after meals, has only occasional readings in the evenings. However her blood sugars are better than expected of her A1c of 8.8  She thinks she has been trying to be consistent with her diet and not excessive amounts of carbohydrates  Again eating only 2 meals a day Does not mark her sugars as after meals when she checks them He does take her morning Levemir dose when she wakes up She taking her Victoza which she thinks she is compliant with; this has been increased to 1.8 mg  Her weight is down 2 pounds.  Hypoglycemia: None  recently  Current insulin regimen: Levemir 22 units AT 9 AM AND 14 at supper. NOVOLOG pen 14 bid a.c.  Victoza 1.8 mg daily at lunch.   Glucometer: Contour.  Her new glucose monitor does not have the correct date and time  Blood Glucose readings recently mostly below 150, only once high at 189 in the last few days  DIET: Usually small portions, may be getting more carbohydrates like rice sometimes Meals: 2 meals per day usually at 11 a.m. and 7-8 p.m.  Physical activity: exercise: walks 10-15 min every 2 days Certified Diabetes Educator visit: Most recent:, 12/12.  Dietician visit: Most recent: 10/15  Retinal exam: Most recent:5/12.  Pneumococcal immunization: 12/12.   Wt Readings from Last 3 Encounters:  09/05/14 179 lb 6.4 oz (81.375 kg)  06/09/14 178 lb (80.74 kg)  04/28/14 180 lb 12.8 oz (82.01 kg)   LABS:  Lab Results  Component Value Date   HGBA1C 8.8* 08/31/2014   HGBA1C 8.1* 06/06/2014   HGBA1C 8.5* 04/25/2014   Lab Results  Component Value Date   MICROALBUR 1.6 01/24/2014   LDLCALC 85 06/06/2014   CREATININE 0.7 08/31/2014     Appointment on 08/31/2014  Component Date Value Ref Range Status  . Sodium 08/31/2014 138  135 - 145 mEq/L Final  . Potassium 08/31/2014 4.3  3.5 - 5.1 mEq/L Final  . Chloride 08/31/2014 105  96 - 112 mEq/L Final  . CO2 08/31/2014 26  19 - 32 mEq/L Final  .  Glucose, Bld 08/31/2014 165* 70 - 99 mg/dL Final  . BUN 25/42/7062 20  6 - 23 mg/dL Final  . Creatinine, Ser 08/31/2014 0.7  0.4 - 1.2 mg/dL Final  . Calcium 37/62/8315 9.3  8.4 - 10.5 mg/dL Final  . GFR 17/61/6073 85.80  >60.00 mL/min Final  . Hgb A1c MFr Bld 08/31/2014 8.8* 4.6 - 6.5 % Final   Glycemic Control Guidelines for People with Diabetes:Non Diabetic:  <6%Goal of Therapy: <7%Additional Action Suggested:  >8%       Medication List       This list is accurate as of: 09/05/14 11:06 AM.  Always use your most recent med list.               atorvastatin 40 MG tablet   Commonly known as:  LIPITOR  TAKE 1 TABLET (40 MG TOTAL) BY MOUTH DAILY.     B-D UF III MINI PEN NEEDLES 31G X 5 MM Misc  Generic drug:  Insulin Pen Needle  USE 5 PEN NEEDLES PER DAY AS INSTRUCTED     BAYER CONTOUR NEXT TEST test strip  Generic drug:  glucose blood  USE TO CHECK BLOOD SUGARS TWICE A DAY DX CODE 250.00     buPROPion 300 MG 24 hr tablet  Commonly known as:  WELLBUTRIN XL  Take 300 mg by mouth daily.     Canagliflozin-Metformin HCl [phone] MG Tabs  Take 150-1,000 mg by mouth 2 (two) times daily. HOLD for 2 days. Resume usual dose on Saturday 12/04/2013.     carvedilol 12.5 MG tablet  Commonly known as:  COREG  Take 12.5 mg by mouth 2 (two) times daily with a meal.     EPIPEN 0.3 mg/0.3 mL Devi  Generic drug:  EPINEPHrine  Inject 0.3 mg into the muscle as needed. For allergic reactions     furosemide 20 MG tablet  Commonly known as:  LASIX  Take 20 mg by mouth daily.     insulin aspart 100 UNIT/ML injection  Commonly known as:  NOVOLOG FLEXPEN  Inject 16 Units into the skin 2 (two) times daily before lunch and supper.     irbesartan 300 MG tablet  Commonly known as:  AVAPRO  Take 300 mg by mouth daily.     KLOR-CON M20 20 MEQ tablet  Generic drug:  potassium chloride SA  TAKE 1 TABLET BY MOUTH EVERY DAY     LEVEMIR FLEXPEN 100 UNIT/ML injection  Generic drug:  insulin detemir  Inject 16-22 Units into the skin 2 (two) times daily before lunch and supper. Takes 22 units every morning and 16 units every evening     naproxen 500 MG tablet  Commonly known as:  NAPROSYN  Take 500 mg by mouth 2 (two) times daily with a meal.     oxybutynin 5 MG 24 hr tablet  Commonly known as:  DITROPAN-XL  Take 5 mg by mouth daily.     VICTOZA 18 MG/3ML Sopn  Generic drug:  Liraglutide  INJECT 1.8 MG DAILY        Allergies: No Known Allergies  Past Medical History  Diagnosis Date  . Diabetes mellitus     a. Dx > 5 y ago  . Hypertension   .  Hypercholesteremia   . Arthritis   . Cardiomyopathy     a. 05/2012 Echo: EF 30-35%, Gr 1 DD, mild LVH, mild MR, pericardial effusion  . Pericardial effusion     a. 05/2012 Echo: circumferential pericardial effusion,  mostly 89mm, largest pocket posterior - 2mm, mild RA indentation, no RV collapse, no tamponade.    Past Surgical History  Procedure Laterality Date  . Abdominal hysterectomy    . Foot surgery    . Left and right heart catheterization with coronary angiogram  05/26/2012    Procedure: LEFT AND RIGHT HEART CATHETERIZATION WITH CORONARY ANGIOGRAM;  Surgeon: Tonny Bollman, MD;  Location: [facility] CATH LAB;  Service: Cardiovascular;;  . Bi-ventricular implantable cardioverter defibrillator N/A 12/01/2013    Procedure: BI-VENTRICULAR IMPLANTABLE CARDIOVERTER DEFIBRILLATOR  (CRT-D);  Surgeon: Duke Salvia, MD;  Location: [facility] CATH LAB;  Service: Cardiovascular;  Laterality: N/A;    Family History  Problem Relation Age of Onset  . Other      No history of CAD  . Tuberculosis Father     died when pt was young.    Social History:  reports that she has quit smoking. She does not have any smokeless tobacco history on file. She reports that she does not drink alcohol or use illicit drugs.  Review of Systems   Lipids: LDL controlled, taking Lipitor 40 mg   Lab Results  Component Value Date   CHOL 162 06/06/2014   HDL 48.30 06/06/2014   LDLCALC 85 06/06/2014   LDLDIRECT 141.0 06/09/2013   TRIG 146.0 06/06/2014   CHOLHDL 3 06/06/2014    Hypertension: Has been on multiple medications, she follows with PCP and was seen last week and was told her blood pressure is adequately controlled  No tingling or numbness in feet   Examination:   BP 146/89 mmHg  Pulse 86  Temp(Src) 98.2 F (36.8 C)  Resp 16  Ht 5\' 3"  (1.6 m)  Wt 179 lb 6.4 oz (81.375 kg)  BMI 31.79 kg/m2  SpO2 97%  Body mass index is 31.79 kg/(m^2).   Repeat blood pressure 146/78 with large cuff No edema  Assesment:    Diabetes type 2, uncontrolled:   Her blood sugars are somewhat difficult to assess as she has not programmed the date on her monitor properly Again checking mostly in the mornings and also overall infrequently Since there is no consistent trend in her blood sugars will not change her insulin regimen as yet Encouraged her to watch diet and walk regularly  Her glucose monitor was programmed with the correct date and time   Hypertension: Blood pressure appears to be high normal and followed by PCP    [facility] 09/05/2014, 11:06 AM

## 2014-09-05 NOTE — Patient Instructions (Addendum)
Please check blood sugars at least half the time about 2 hours after any meal and 3 times per week on waking up. Please bring blood sugar monitor to each visit  Call if sugars over 200 after eating or > 150 in ams

## 2014-09-06 ENCOUNTER — Telehealth: Payer: Self-pay | Admitting: Endocrinology

## 2014-09-06 ENCOUNTER — Telehealth: Payer: Self-pay | Admitting: *Deleted

## 2014-09-06 NOTE — Telephone Encounter (Signed)
Patient called, she checked her sugar last night and it was 236, when she checked it this morning before eating it was 336, Please advise.

## 2014-09-06 NOTE — Telephone Encounter (Signed)
Did she forget any of the insulin doses yesterday?  She can take additional 4 units of her rapid acting insulin with her breakfast dosage and call if blood sugars are consistently high

## 2014-09-06 NOTE — Telephone Encounter (Signed)
error 

## 2014-09-06 NOTE — Telephone Encounter (Signed)
Noted, patient is aware. 

## 2014-09-19 ENCOUNTER — Other Ambulatory Visit: Payer: Self-pay | Admitting: *Deleted

## 2014-09-19 MED ORDER — INSULIN PEN NEEDLE 31G X 5 MM MISC: Status: DC

## 2014-09-26 ENCOUNTER — Other Ambulatory Visit: Payer: Self-pay | Admitting: Endocrinology

## 2014-09-27 ENCOUNTER — Telehealth: Payer: Self-pay | Admitting: Endocrinology

## 2014-09-27 NOTE — Telephone Encounter (Signed)
invokana will not be covered by the insurance for the pt. Please advise. The insurance letter has already been brought up to let us know of the alternates

## 2014-09-28 ENCOUNTER — Other Ambulatory Visit: Payer: Self-pay | Admitting: *Deleted

## 2014-09-28 MED ORDER — CANAGLIFLOZIN-METFORMIN HCL 150-1000 MG PO TABS: ORAL_TABLET | ORAL | Status: DC

## 2014-10-07 DIAGNOSIS — Z95810 Presence of automatic (implantable) cardiac defibrillator: Secondary | ICD-10-CM | POA: Diagnosis not present

## 2014-10-16 ENCOUNTER — Other Ambulatory Visit: Payer: Self-pay | Admitting: Endocrinology

## 2014-10-17 ENCOUNTER — Other Ambulatory Visit: Payer: Self-pay | Admitting: *Deleted

## 2014-10-17 MED ORDER — INSULIN ASPART 100 UNIT/ML ~~LOC~~ SOLN: 16.0000 [IU] | Freq: Two times a day (BID) | SUBCUTANEOUS | Status: DC

## 2014-11-02 ENCOUNTER — Other Ambulatory Visit (INDEPENDENT_AMBULATORY_CARE_PROVIDER_SITE_OTHER): Payer: Federal, State, Local not specified - PPO

## 2014-11-02 DIAGNOSIS — E1165 Type 2 diabetes mellitus with hyperglycemia: Secondary | ICD-10-CM

## 2014-11-02 DIAGNOSIS — I10 Essential (primary) hypertension: Secondary | ICD-10-CM | POA: Diagnosis not present

## 2014-11-02 DIAGNOSIS — Z95810 Presence of automatic (implantable) cardiac defibrillator: Secondary | ICD-10-CM | POA: Diagnosis not present

## 2014-11-02 DIAGNOSIS — IMO0002 Reserved for concepts with insufficient information to code with codable children: Secondary | ICD-10-CM

## 2014-11-02 DIAGNOSIS — I5042 Chronic combined systolic (congestive) and diastolic (congestive) heart failure: Secondary | ICD-10-CM | POA: Diagnosis not present

## 2014-11-02 DIAGNOSIS — I429 Cardiomyopathy, unspecified: Secondary | ICD-10-CM | POA: Diagnosis not present

## 2014-11-02 LAB — URINALYSIS, ROUTINE W REFLEX MICROSCOPIC
Bilirubin Urine: NEGATIVE
Hgb urine dipstick: NEGATIVE
Ketones, ur: NEGATIVE
Leukocytes, UA: NEGATIVE
Nitrite: NEGATIVE
RBC / HPF: NONE SEEN (ref 0–?)
Specific Gravity, Urine: 1.015 (ref 1.000–1.030)
Total Protein, Urine: NEGATIVE
Urine Glucose: >=1000 — AB
Urobilinogen, UA: 0.2 (ref 0.0–1.0)
pH: 6.5 (ref 5.0–8.0)

## 2014-11-02 LAB — COMPREHENSIVE METABOLIC PANEL
ALT: 40 U/L — ABNORMAL HIGH (ref 0–35)
AST: 29 U/L (ref 0–37)
Albumin: 4.3 g/dL (ref 3.5–5.2)
Alkaline Phosphatase: 128 U/L — ABNORMAL HIGH (ref 39–117)
BUN: 22 mg/dL (ref 6–23)
CO2: 29 mEq/L (ref 19–32)
Calcium: 10.1 mg/dL (ref 8.4–10.5)
Chloride: 101 mEq/L (ref 96–112)
Creatinine, Ser: 0.68 mg/dL (ref 0.40–1.20)
GFR: 93.07 mL/min (ref >60.00)
Glucose, Bld: 145 mg/dL — ABNORMAL HIGH (ref 70–99)
Potassium: 4.4 mEq/L (ref 3.5–5.1)
Sodium: 139 mEq/L (ref 135–145)
Total Bilirubin: 0.4 mg/dL (ref 0.2–1.2)
Total Protein: 8.1 g/dL (ref 6.0–8.3)

## 2014-11-02 LAB — MICROALBUMIN / CREATININE URINE RATIO
Creatinine,U: 60.8 mg/dL
Microalb Creat Ratio: 8.9 mg/g (ref 0.0–30.0)
Microalb, Ur: 5.4 mg/dL — ABNORMAL HIGH (ref 0.0–1.9)

## 2014-11-02 LAB — HEMOGLOBIN A1C: Hgb A1c MFr Bld: 9 % — ABNORMAL HIGH (ref 4.6–6.5)

## 2014-11-07 ENCOUNTER — Encounter: Payer: Self-pay | Admitting: Endocrinology

## 2014-11-07 ENCOUNTER — Ambulatory Visit (INDEPENDENT_AMBULATORY_CARE_PROVIDER_SITE_OTHER): Payer: Federal, State, Local not specified - PPO | Admitting: Endocrinology

## 2014-11-07 VITALS — BP 132/82 | HR 83 | Temp 98.5°F | Resp 16 | Ht 63.0 in | Wt 172.0 lb

## 2014-11-07 DIAGNOSIS — E1165 Type 2 diabetes mellitus with hyperglycemia: Secondary | ICD-10-CM | POA: Diagnosis not present

## 2014-11-07 DIAGNOSIS — Z95810 Presence of automatic (implantable) cardiac defibrillator: Secondary | ICD-10-CM | POA: Diagnosis not present

## 2014-11-07 DIAGNOSIS — I10 Essential (primary) hypertension: Secondary | ICD-10-CM | POA: Diagnosis not present

## 2014-11-07 DIAGNOSIS — IMO0002 Reserved for concepts with insufficient information to code with codable children: Secondary | ICD-10-CM

## 2014-11-07 NOTE — Progress Notes (Signed)
Patient ID: Linda Lowe, female   DOB: 1951-03-07, 64 y.o.   MRN: [id number]   Reason for Appointment: Diabetes follow-up   History of Present Illness   Diagnosis: date of diagnosis: ? 2009  PAST history: Her diabetes has been usually poorly controlled. She has been on multiple medications including Victoza and basal bolus regimen also. However she  had difficulty with consistent compliance with insulin, diet and exercise regimen. With starting Victoza her blood sugars appeared to be improved. After repeated sessions with educators she was able to improve her compliance level and blood sugars had been improving since about 10/13. Insulin doses have been increased overall  A1c previously had been usually over 8% and below this for the first time in 2/14. She had been switched to a combination of Invokana and metformin instead of Janumet in late 2014 In 1/15 her blood sugars are excellent with A1c 7%  RECENT history:  In 2015 her blood sugars had been gradually more difficult to control with rising A1c and persistently over 8% A1c is now over 9% Difficult to assess her blood sugar pattern since she is still checking blood sugars mostly before her first meal These are fairly good but blood sugars are relatively higher before her evening meal She has not checked readings after meals and not clear how high these are He is eating more carbohydrate with 2 cups of rice a day and this is likely causing hyperglycemia Does not think she is drinking drinks with sugar Her weight has gone down probably because of hyperglycemia and not enough exercise She taking her Victoza which she thinks she is compliant with 1.8 mg daily  He does take her morning Levemir dose when she wakes up and evening dose before supper with her NovoLog Her weight is down 7 pounds.  Hypoglycemia: None although occasionally may feel hypoglycemic in the morning  Current insulin regimen: Levemir 22 units AT 9 AM AND 14 at supper.  NOVOLOG pen 14 bid a.c.  Victoza 1.8 mg daily at lunch.   Glucometer: Contour.  Blood Glucose readings are being checked mostly before her first meal  PRE-MEAL Breakfast Lunch Dinner Bedtime Overall  Glucose range:  93-207    132-190     Mean/median:  129    175    142    DIET: Usually small portions, is getting more carbohydrates like rice and now eating 1 cup twice a day  Meals: 2 meals per day usually at 11 a.m. and 7-8 p.m.  Physical activity: exercise: walks 15 min every 2 days Certified Diabetes Educator visit: Most recent:, 12/12.  Dietician visit: Most recent: 10/15  Retinal exam: Most recent:5/12.  Pneumococcal immunization: 12/12.   Wt Readings from Last 3 Encounters:  11/07/14 172 lb (78.019 kg)  09/05/14 179 lb 6.4 oz (81.375 kg)  06/09/14 178 lb (80.74 kg)   LABS:  Lab Results  Component Value Date   HGBA1C 9.0* 11/02/2014   HGBA1C 8.8* 08/31/2014   HGBA1C 8.1* 06/06/2014   Lab Results  Component Value Date   MICROALBUR 5.4* 11/02/2014   LDLCALC 85 06/06/2014   CREATININE 0.68 11/02/2014     Appointment on 11/02/2014  Component Date Value Ref Range Status  . Hgb A1c MFr Bld 11/02/2014 9.0* 4.6 - 6.5 % Final   Glycemic Control Guidelines for People with Diabetes:Non Diabetic:  <6%Goal of Therapy: <7%Additional Action Suggested:  >8%   . Sodium 11/02/2014 139  135 - 145 mEq/L Final  . Potassium 11/02/2014  4.4  3.5 - 5.1 mEq/L Final  . Chloride 11/02/2014 101  96 - 112 mEq/L Final  . CO2 11/02/2014 29  19 - 32 mEq/L Final  . Glucose, Bld 11/02/2014 145* 70 - 99 mg/dL Final  . BUN 27/74/1287 22  6 - 23 mg/dL Final  . Creatinine, Ser 11/02/2014 0.68  0.40 - 1.20 mg/dL Final  . Total Bilirubin 11/02/2014 0.4  0.2 - 1.2 mg/dL Final  . Alkaline Phosphatase 11/02/2014 128* 39 - 117 U/L Final  . AST 11/02/2014 29  0 - 37 U/L Final  . ALT 11/02/2014 40* 0 - 35 U/L Final  . Total Protein 11/02/2014 8.1  6.0 - 8.3 g/dL Final  . Albumin 86/76/7209 4.3  3.5 - 5.2  g/dL Final  . Calcium 47/05/6282 10.1  8.4 - 10.5 mg/dL Final  . GFR 66/29/4765 93.07  >60.00 mL/min Final  . Microalb, Ur 11/02/2014 5.4* 0.0 - 1.9 mg/dL Final  . Creatinine,U 46/50/3546 60.8   Final  . Microalb Creat Ratio 11/02/2014 8.9  0.0 - 30.0 mg/g Final  . Color, Urine 11/02/2014 YELLOW  Yellow;Lt. Yellow Final  . APPearance 11/02/2014 CLEAR  Clear Final  . Specific Gravity, Urine 11/02/2014 1.015  1.000-1.030 Final  . pH 11/02/2014 6.5  5.0 - 8.0 Final  . Total Protein, Urine 11/02/2014 NEGATIVE  Negative Final  . Urine Glucose 11/02/2014 >=1000* Negative Final  . Ketones, ur 11/02/2014 NEGATIVE  Negative Final  . Bilirubin Urine 11/02/2014 NEGATIVE  Negative Final  . Hgb urine dipstick 11/02/2014 NEGATIVE  Negative Final  . Urobilinogen, UA 11/02/2014 0.2  0.0 - 1.0 Final  . Leukocytes, UA 11/02/2014 NEGATIVE  Negative Final  . Nitrite 11/02/2014 NEGATIVE  Negative Final  . WBC, UA 11/02/2014 3-6/hpf* 0-2/hpf Final  . RBC / HPF 11/02/2014 none seen  0-2/hpf Final  . Squamous Epithelial / LPF 11/02/2014 Rare(0-4/hpf)  Rare(0-4/hpf) Final      Medication List       This list is accurate as of: 11/07/14 10:37 AM.  Always use your most recent med list.               atorvastatin 40 MG tablet  Commonly known as:  LIPITOR  TAKE 1 TABLET (40 MG TOTAL) BY MOUTH DAILY.     BAYER CONTOUR NEXT TEST test strip  Generic drug:  glucose blood  USE TO CHECK BLOOD SUGARS TWICE A DAY DX CODE 250.00     buPROPion 300 MG 24 hr tablet  Commonly known as:  WELLBUTRIN XL  Take 300 mg by mouth daily.     Canagliflozin-Metformin HCl [phone] MG Tabs  Take 1 tablet 2 times daily     carvedilol 12.5 MG tablet  Commonly known as:  COREG  Take 12.5 mg by mouth 2 (two) times daily with a meal.     EPIPEN 0.3 mg/0.3 mL Devi  Generic drug:  EPINEPHrine  Inject 0.3 mg into the muscle as needed. For allergic reactions     furosemide 20 MG tablet  Commonly known as:  LASIX  Take 20  mg by mouth daily.     insulin aspart 100 UNIT/ML injection  Commonly known as:  novoLOG  Inject 16 Units into the skin 2 (two) times daily before lunch and supper.     Insulin Pen Needle 31G X 5 MM Misc  Commonly known as:  B-D UF III MINI PEN NEEDLES  USE 5 PEN NEEDLES PER DAY AS INSTRUCTED     irbesartan 300  MG tablet  Commonly known as:  AVAPRO  Take 300 mg by mouth daily.     KLOR-CON M20 20 MEQ tablet  Generic drug:  potassium chloride SA  TAKE 1 TABLET BY MOUTH EVERY DAY     LEVEMIR FLEXPEN 100 UNIT/ML injection  Generic drug:  insulin detemir  Inject 16-22 Units into the skin 2 (two) times daily before lunch and supper. Takes 22 units every morning and 16 units every evening     naproxen 500 MG tablet  Commonly known as:  NAPROSYN  Take 500 mg by mouth 2 (two) times daily with a meal.     oxybutynin 5 MG 24 hr tablet  Commonly known as:  DITROPAN-XL  Take 5 mg by mouth daily.     VICTOZA 18 MG/3ML Sopn  Generic drug:  Liraglutide  INJECT 1.8 MG DAILY        Allergies: No Known Allergies  Past Medical History  Diagnosis Date  . Diabetes mellitus     a. Dx > 5 y ago  . Hypertension   . Hypercholesteremia   . Arthritis   . Cardiomyopathy     a. 05/2012 Echo: EF 30-35%, Gr 1 DD, mild LVH, mild MR, pericardial effusion  . Pericardial effusion     a. 05/2012 Echo: circumferential pericardial effusion, mostly 83mm, largest pocket posterior - 80mm, mild RA indentation, no RV collapse, no tamponade.    Past Surgical History  Procedure Laterality Date  . Abdominal hysterectomy    . Foot surgery    . Left and right heart catheterization with coronary angiogram  05/26/2012    Procedure: LEFT AND RIGHT HEART CATHETERIZATION WITH CORONARY ANGIOGRAM;  Surgeon: Tonny Bollman, MD;  Location: [facility] CATH LAB;  Service: Cardiovascular;;  . Bi-ventricular implantable cardioverter defibrillator N/A 12/01/2013    Procedure: BI-VENTRICULAR IMPLANTABLE CARDIOVERTER DEFIBRILLATOR   (CRT-D);  Surgeon: Duke Salvia, MD;  Location: [facility] CATH LAB;  Service: Cardiovascular;  Laterality: N/A;    Family History  Problem Relation Age of Onset  . Other      No history of CAD  . Tuberculosis Father     died when pt was young.    Social History:  reports that she has quit smoking. She does not have any smokeless tobacco history on file. She reports that she does not drink alcohol or use illicit drugs.  Review of Systems   Lipids: LDL controlled, taking Lipitor 40 mg   Lab Results  Component Value Date   CHOL 162 06/06/2014   HDL 48.30 06/06/2014   LDLCALC 85 06/06/2014   LDLDIRECT 141.0 06/09/2013   TRIG 146.0 06/06/2014   CHOLHDL 3 06/06/2014    Hypertension: Has been on multiple medications, she follows with PCP  No numbness or tingling in her feet.   Examination:   BP 139/99 mmHg  Pulse 83  Temp(Src) 98.5 F (36.9 C)  Resp 16  Ht 5\' 3"  (1.6 m)  Wt 172 lb (78.019 kg)  BMI 30.48 kg/m2  SpO2 97%  Body mass index is 30.48 kg/(m^2).   Repeat blood pressure 132/82  Diabetic foot exam shows normal monofilament sensation in the toes and plantar surfaces, no skin lesions or ulcers on the feet and normal pedal pulses  Assesment:   Diabetes type 2, uncontrolled:   Her blood sugars are poorly controlled with A1c now 9% Most likely she is having postprandial hyperglycemia since fasting readings are fairly good See history of present illness for current management and problems identified  She does need to at least increase her Levemir at breakfast by 2 units and NovoLog also by 2 units at suppertime Encouraged her to cut back on carbohydrates especially rice  She will need to have a follow-up to evaluate her postprandial readings and adjust her mealtime dose accordingly She does need to let us know if blood sugars are not well controlled Also consider changing to Toujeo on her next visit, she has a large supply of Levemir currently  Hypertension: Blood  pressure is controlled    Patient Instructions  Please check blood sugars at least half the time about 2 hours after any meal and 3 times per week on waking up. Please bring blood sugar monitor to each visit. Recommended blood sugar levels about 2 hours after meal is 140-180 and on waking up 90-130  LEVEMIR 24 IN AM AND 16 IN PM  Novolog 16 before breakfast and 18 before supper  Reduce portions of rice  Call before refilling Levemir   Counseling time over 50% of today's 25 minute visit  Yeshaya Vath 11/07/2014, 10:37 AM

## 2014-11-07 NOTE — Patient Instructions (Addendum)
Please check blood sugars at least half the time about 2 hours after any meal and 3 times per week on waking up. Please bring blood sugar monitor to each visit. Recommended blood sugar levels about 2 hours after meal is 140-180 and on waking up 90-130  LEVEMIR 24 IN AM AND 16 IN PM  Novolog 16 before breakfast and 18 before supper  Reduce portions of rice  Call before refilling Levemir

## 2014-11-12 ENCOUNTER — Other Ambulatory Visit: Payer: Self-pay | Admitting: Endocrinology

## 2014-11-17 DIAGNOSIS — K5900 Constipation, unspecified: Secondary | ICD-10-CM | POA: Diagnosis not present

## 2014-11-17 DIAGNOSIS — K601 Chronic anal fissure: Secondary | ICD-10-CM | POA: Diagnosis not present

## 2014-11-18 ENCOUNTER — Other Ambulatory Visit: Payer: Self-pay | Admitting: Endocrinology

## 2014-12-06 ENCOUNTER — Other Ambulatory Visit: Payer: Self-pay | Admitting: *Deleted

## 2014-12-06 MED ORDER — CANAGLIFLOZIN-METFORMIN HCL 150-1000 MG PO TABS: ORAL_TABLET | ORAL | Status: DC

## 2015-01-04 DIAGNOSIS — I10 Essential (primary) hypertension: Secondary | ICD-10-CM | POA: Diagnosis not present

## 2015-01-04 DIAGNOSIS — E0800 Diabetes mellitus due to underlying condition with hyperosmolarity without nonketotic hyperglycemic-hyperosmolar coma (NKHHC): Secondary | ICD-10-CM | POA: Diagnosis not present

## 2015-01-06 DIAGNOSIS — Z95810 Presence of automatic (implantable) cardiac defibrillator: Secondary | ICD-10-CM | POA: Diagnosis not present

## 2015-01-28 ENCOUNTER — Other Ambulatory Visit: Payer: Self-pay | Admitting: Endocrinology

## 2015-01-31 ENCOUNTER — Other Ambulatory Visit (HOSPITAL_COMMUNITY): Payer: Self-pay | Admitting: Family Medicine

## 2015-01-31 DIAGNOSIS — Z1231 Encounter for screening mammogram for malignant neoplasm of breast: Secondary | ICD-10-CM

## 2015-02-01 ENCOUNTER — Other Ambulatory Visit (INDEPENDENT_AMBULATORY_CARE_PROVIDER_SITE_OTHER): Payer: Federal, State, Local not specified - PPO

## 2015-02-01 DIAGNOSIS — E1165 Type 2 diabetes mellitus with hyperglycemia: Secondary | ICD-10-CM | POA: Diagnosis not present

## 2015-02-01 DIAGNOSIS — IMO0002 Reserved for concepts with insufficient information to code with codable children: Secondary | ICD-10-CM

## 2015-02-01 LAB — HEMOGLOBIN A1C: Hgb A1c MFr Bld: 8.6 % — ABNORMAL HIGH (ref 4.6–6.5)

## 2015-02-01 LAB — COMPREHENSIVE METABOLIC PANEL
ALT: 22 U/L (ref 0–35)
AST: 21 U/L (ref 0–37)
Albumin: 4.4 g/dL (ref 3.5–5.2)
Alkaline Phosphatase: 120 U/L — ABNORMAL HIGH (ref 39–117)
BUN: 21 mg/dL (ref 6–23)
CO2: 26 mEq/L (ref 19–32)
Calcium: 10.2 mg/dL (ref 8.4–10.5)
Chloride: 102 mEq/L (ref 96–112)
Creatinine, Ser: 0.74 mg/dL (ref 0.40–1.20)
GFR: 84.35 mL/min (ref >60.00)
Glucose, Bld: 173 mg/dL — ABNORMAL HIGH (ref 70–99)
Potassium: 3.7 mEq/L (ref 3.5–5.1)
Sodium: 137 mEq/L (ref 135–145)
Total Bilirubin: 0.6 mg/dL (ref 0.2–1.2)
Total Protein: 8.1 g/dL (ref 6.0–8.3)

## 2015-02-05 DIAGNOSIS — Z95810 Presence of automatic (implantable) cardiac defibrillator: Secondary | ICD-10-CM | POA: Diagnosis not present

## 2015-02-06 ENCOUNTER — Encounter: Payer: Self-pay | Admitting: Endocrinology

## 2015-02-06 ENCOUNTER — Ambulatory Visit (INDEPENDENT_AMBULATORY_CARE_PROVIDER_SITE_OTHER): Payer: Federal, State, Local not specified - PPO | Admitting: Endocrinology

## 2015-02-06 VITALS — BP 122/70 | HR 68 | Temp 98.1°F | Ht 63.0 in | Wt 178.0 lb

## 2015-02-06 DIAGNOSIS — E1165 Type 2 diabetes mellitus with hyperglycemia: Secondary | ICD-10-CM | POA: Diagnosis not present

## 2015-02-06 DIAGNOSIS — IMO0002 Reserved for concepts with insufficient information to code with codable children: Secondary | ICD-10-CM

## 2015-02-06 DIAGNOSIS — E785 Hyperlipidemia, unspecified: Secondary | ICD-10-CM

## 2015-02-06 DIAGNOSIS — I10 Essential (primary) hypertension: Secondary | ICD-10-CM | POA: Diagnosis not present

## 2015-02-06 NOTE — Progress Notes (Signed)
Patient ID: Linda Lowe, female   DOB: 04/30/1951, 64 y.o.   MRN: [id number]   Reason for Appointment: Diabetes follow-up   History of Present Illness   Diagnosis: date of diagnosis: ? 2009  PAST history: Her diabetes has been usually poorly controlled. She has been on multiple medications including Victoza and basal bolus regimen also. However she  had difficulty with consistent compliance with insulin, diet and exercise regimen. With starting Victoza her blood sugars appeared to be improved. After repeated sessions with educators she was able to improve her compliance level and blood sugars had been improving since about 10/13. Insulin doses have been increased overall  A1c previously had been usually over 8% and below this for the first time in 2/14. She had been switched to a combination of Invokana and metformin instead of Janumet in late 2014 In 1/15 her blood sugars are excellent with A1c 7%  RECENT history:   Current insulin regimen: Levemir 24 units AT 6 AM AND 16 at supper. NOVOLOG pen 14 bid a.c.  Victoza 1.8 mg daily at lunch.   In 2015 her blood sugars had been gradually more difficult to control with rising A1c and persistently over 8% A1c is now slightly better but still over 8% Current blood sugar patterns and problems identified:  Difficult to assess her blood sugar reading after meals since she is still checking blood sugars mostly before her first meal midday and not on waking up or after meals at all.   She has variable blood sugars when she checked them around 10-11 AM and this may depend on whether or not she is eating breakfast which she is doing about 3 times a week now.  She says that when she does not eat breakfast and takes her Levemir earlier in the morning she will feel the blood sugar getting low  She has only one reading after her evening meal which was high and she does not know why  She now says that she is taking the Invokamet only once a day in the  morning instead of twice a day as directed  Not clear why her weight is higher by 6 pounds on this visit  She thinks she is trying to exercise regularly She taking her Victoza which she thinks she is compliant with 1.8 mg daily She was told to call before refilling her Levemir but she has not done so and still has 3 boxes of this  Hypoglycemia: Couple of times a week she may feel low before lunch if not eating breakfast, has only one documented low normal reading of 65   Glucometer: Contour.  Blood Glucose readings are being checked mostly before her first meal  PRE-MEAL Breakfast Lunch  7:30 PM  Bedtime Overall  Glucose range:  65-218    255     Mean/median:  147         DIET: Usually small portions, is getting more carbohydrates like rice and now eating 1 cup twice a day  Meals: 2 meals per day usually at 11 a.m. and 7-8 p.m.  Physical activity: exercise: walks 15 min or bike Certified Diabetes Educator visit: Most recent:, 12/12.  Dietician visit: Most recent: 10/15  Retinal exam: Most recent:5/12.  Pneumococcal immunization: 12/12.   Wt Readings from Last 3 Encounters:  02/06/15 178 lb (80.74 kg)  11/07/14 172 lb (78.019 kg)  09/05/14 179 lb 6.4 oz (81.375 kg)   LABS:  Lab Results  Component Value Date   HGBA1C  8.6* 02/01/2015   HGBA1C 9.0* 11/02/2014   HGBA1C 8.8* 08/31/2014   Lab Results  Component Value Date   MICROALBUR 5.4* 11/02/2014   LDLCALC 85 06/06/2014   CREATININE 0.74 02/01/2015     Lab on 02/01/2015  Component Date Value Ref Range Status  . Hgb A1c MFr Bld 02/01/2015 8.6* 4.6 - 6.5 % Final   Glycemic Control Guidelines for People with Diabetes:Non Diabetic:  <6%Goal of Therapy: <7%Additional Action Suggested:  >8%   . Sodium 02/01/2015 137  135 - 145 mEq/L Final  . Potassium 02/01/2015 3.7  3.5 - 5.1 mEq/L Final  . Chloride 02/01/2015 102  96 - 112 mEq/L Final  . CO2 02/01/2015 26  19 - 32 mEq/L Final  . Glucose, Bld 02/01/2015 173* 70 - 99  mg/dL Final  . BUN 40/06/2724 21  6 - 23 mg/dL Final  . Creatinine, Ser 02/01/2015 0.74  0.40 - 1.20 mg/dL Final  . Total Bilirubin 02/01/2015 0.6  0.2 - 1.2 mg/dL Final  . Alkaline Phosphatase 02/01/2015 120* 39 - 117 U/L Final  . AST 02/01/2015 21  0 - 37 U/L Final  . ALT 02/01/2015 22  0 - 35 U/L Final  . Total Protein 02/01/2015 8.1  6.0 - 8.3 g/dL Final  . Albumin 36/64/4034 4.4  3.5 - 5.2 g/dL Final  . Calcium 74/25/9563 10.2  8.4 - 10.5 mg/dL Final  . GFR 87/56/4332 84.35  >60.00 mL/min Final      Medication List       This list is accurate as of: 02/06/15 10:59 AM.  Always use your most recent med list.               atorvastatin 40 MG tablet  Commonly known as:  LIPITOR  TAKE 1 TABLET (40 MG TOTAL) BY MOUTH DAILY.     BAYER CONTOUR NEXT TEST test strip  Generic drug:  glucose blood  USE TO CHECK BLOOD SUGARS TWICE A DAY DX CODE 250.00     buPROPion 300 MG 24 hr tablet  Commonly known as:  WELLBUTRIN XL  Take 300 mg by mouth daily.     Canagliflozin-Metformin HCl [phone] MG Tabs  Take 1 tablet 2 times daily     carvedilol 12.5 MG tablet  Commonly known as:  COREG  Take 12.5 mg by mouth 2 (two) times daily with a meal.     EPIPEN 0.3 mg/0.3 mL Devi  Generic drug:  EPINEPHrine  Inject 0.3 mg into the muscle as needed. For allergic reactions     furosemide 20 MG tablet  Commonly known as:  LASIX  Take 20 mg by mouth daily.     Insulin Pen Needle 31G X 5 MM Misc  Commonly known as:  B-D UF III MINI PEN NEEDLES  USE 5 PEN NEEDLES PER DAY AS INSTRUCTED     irbesartan 300 MG tablet  Commonly known as:  AVAPRO  Take 300 mg by mouth daily.     KLOR-CON M20 20 MEQ tablet  Generic drug:  potassium chloride SA  TAKE 1 TABLET BY MOUTH EVERY DAY     LEVEMIR FLEXPEN 100 UNIT/ML injection  Generic drug:  insulin detemir  Inject 16-22 Units into the skin 2 (two) times daily before lunch and supper. Takes 22 units every morning and 16 units every evening      naproxen 500 MG tablet  Commonly known as:  NAPROSYN  Take 500 mg by mouth 2 (two) times daily with a meal.  NOVOLOG FLEXPEN 100 UNIT/ML FlexPen  Generic drug:  insulin aspart  INJECT 16 UNITS INTO THE SKIN 2 (TWO) TIMES DAILY BEFORE LUNCH AND SUPPER.     oxybutynin 5 MG 24 hr tablet  Commonly known as:  DITROPAN-XL  Take 5 mg by mouth daily.     VICTOZA 18 MG/3ML Sopn  Generic drug:  Liraglutide  INJECT 1.8 MG DAILY        Allergies: No Known Allergies  Past Medical History  Diagnosis Date  . Diabetes mellitus     a. Dx > 5 y ago  . Hypertension   . Hypercholesteremia   . Arthritis   . Cardiomyopathy     a. 05/2012 Echo: EF 30-35%, Gr 1 DD, mild LVH, mild MR, pericardial effusion  . Pericardial effusion     a. 05/2012 Echo: circumferential pericardial effusion, mostly 44mm, largest pocket posterior - 44mm, mild RA indentation, no RV collapse, no tamponade.    Past Surgical History  Procedure Laterality Date  . Abdominal hysterectomy    . Foot surgery    . Left and right heart catheterization with coronary angiogram  05/26/2012    Procedure: LEFT AND RIGHT HEART CATHETERIZATION WITH CORONARY ANGIOGRAM;  Surgeon: Tonny Bollman, MD;  Location: [facility] CATH LAB;  Service: Cardiovascular;;  . Bi-ventricular implantable cardioverter defibrillator N/A 12/01/2013    Procedure: BI-VENTRICULAR IMPLANTABLE CARDIOVERTER DEFIBRILLATOR  (CRT-D);  Surgeon: Duke Salvia, MD;  Location: [facility] CATH LAB;  Service: Cardiovascular;  Laterality: N/A;    Family History  Problem Relation Age of Onset  . Other      No history of CAD  . Tuberculosis Father     died when pt was young.    Social History:  reports that she has quit smoking. She does not have any smokeless tobacco history on file. She reports that she does not drink alcohol or use illicit drugs.  Review of Systems   Lipids: LDL controlled, taking Lipitor 40 mg   Lab Results  Component Value Date   CHOL 162 06/06/2014   HDL  48.30 06/06/2014   LDLCALC 85 06/06/2014   LDLDIRECT 141.0 06/09/2013   TRIG 146.0 06/06/2014   CHOLHDL 3 06/06/2014    Hypertension: Has been on multiple medications, she follows with PCP  Taking 20mg  Lasix because of history of LV dysfunction, followed by cardiologist still near 9%   No numbness or tingling in her feet. Diabetic foot exam was normal in 2/16   Examination:   BP 122/70 mmHg  Pulse 68  Temp(Src) 98.1 F (36.7 C) (Oral)  Ht 5\' 3"  (1.6 m)  Wt 178 lb (80.74 kg)  BMI 31.54 kg/m2  SpO2 95%  Body mass index is 31.54 kg/(m^2).    No pedal edema  Assesment:   Diabetes type 2, uncontrolled:   See history of present illness for current management and problems identified  Her blood sugars are poorly controlled with A1c still near 9% Most likely she is having postprandial hyperglycemia since fasting readings are overall not significantly high although there are inconsistent She had an increase in her insulin done on the last visit but this is not improving her sugars much Also it appears that she is not taking Invokamet, as directed twice a day even though it is on her prescription She was advised to starting this twice a day  She is trying a little better to improve her diet and try to walk also but for some reason has gained a significant amount of  weight  Not clear why she forgets to keep taking her blood sugar reading after meals especially in the evenings; she does have relatively high reading once after supper This is despite her taking Victoza also  Will need to evaluate her postprandial readings and adjust her mealtime dose accordingly Other problems include tendency to low blood sugars before lunch when she skips her breakfast and takes her Levemir in the morning relatively early; discussed that she can delay taking her Levemir in the morning if she is not eating breakfast for now She does need to cover all 3 meals with insulin if she is eating them;  however she is very inconsistent with this Also consider changing to Toujeo before she refills her Levemir, she has a large supply of Levemir currently Discussed the differences between Levemir and Toujeo and she can take this once a day for more consistent control, probably start doing this in the evenings  Hypertension: Blood pressure is controlled and relatively lower today Discussed that she may have lower blood pressure readings with increasing her Invokamet to twice a day and she will need to follow-up with cardiologist and PCP May consider stopping Lasix since she is on Invokana    Patient Instructions  If not eating Breakfast take levemir at 9-10 am  Call before refilling Levemir  Check blood sugars on waking up ..3 .. times a week Also check blood sugars about 2 hours after a meal and do this after different meals by rotation  Recommended blood sugar levels on waking up is 90-130 and about 2 hours after meal is 140-180 Please bring blood sugar monitor to each visit.  Take Invokamet 2x daily    Counseling time on subjects discussed above is over 50% of today's 25 minute visit  Athenia Rys 02/06/2015, 10:59 AM

## 2015-02-06 NOTE — Patient Instructions (Addendum)
If not eating Breakfast take levemir at 9-10 am  Call before refilling Levemir  Check blood sugars on waking up ..3 .. times a week Also check blood sugars about 2 hours after a meal and do this after different meals by rotation  Recommended blood sugar levels on waking up is 90-130 and about 2 hours after meal is 140-180 Please bring blood sugar monitor to each visit.  Take Invokamet 2x daily

## 2015-02-06 NOTE — Progress Notes (Signed)
Pre visit review using our clinic review tool, if applicable. No additional management support is needed unless otherwise documented below in the visit note. 

## 2015-02-09 ENCOUNTER — Other Ambulatory Visit: Payer: Self-pay | Admitting: *Deleted

## 2015-02-09 MED ORDER — CVS LANCETS THIN MISC: Status: DC

## 2015-02-21 ENCOUNTER — Other Ambulatory Visit: Payer: Self-pay | Admitting: Endocrinology

## 2015-02-24 ENCOUNTER — Ambulatory Visit (HOSPITAL_COMMUNITY)
Admission: RE | Admit: 2015-02-24 | Discharge: 2015-02-24 | Disposition: A | Discharge status: Home / Self Care | Payer: Federal, State, Local not specified - PPO | Source: Ambulatory Visit | Attending: Family Medicine | Admitting: Family Medicine

## 2015-02-24 DIAGNOSIS — Z1231 Encounter for screening mammogram for malignant neoplasm of breast: Secondary | ICD-10-CM | POA: Diagnosis not present

## 2015-02-28 ENCOUNTER — Encounter: Payer: Self-pay | Admitting: *Deleted

## 2015-02-28 ENCOUNTER — Other Ambulatory Visit: Payer: Self-pay | Admitting: Family Medicine

## 2015-02-28 DIAGNOSIS — R928 Other abnormal and inconclusive findings on diagnostic imaging of breast: Secondary | ICD-10-CM

## 2015-03-10 ENCOUNTER — Ambulatory Visit
Admission: RE | Admit: 2015-03-10 | Discharge: 2015-03-10 | Disposition: A | Discharge status: Home / Self Care | Payer: Federal, State, Local not specified - PPO | Source: Ambulatory Visit | Attending: Family Medicine | Admitting: Family Medicine

## 2015-03-10 DIAGNOSIS — R928 Other abnormal and inconclusive findings on diagnostic imaging of breast: Secondary | ICD-10-CM

## 2015-03-11 ENCOUNTER — Other Ambulatory Visit: Payer: Self-pay | Admitting: Endocrinology

## 2015-04-07 DIAGNOSIS — Z95810 Presence of automatic (implantable) cardiac defibrillator: Secondary | ICD-10-CM | POA: Diagnosis not present

## 2015-04-08 ENCOUNTER — Other Ambulatory Visit: Payer: Self-pay | Admitting: Endocrinology

## 2015-04-28 ENCOUNTER — Telehealth: Payer: Self-pay | Admitting: Endocrinology

## 2015-04-28 ENCOUNTER — Other Ambulatory Visit: Payer: Self-pay | Admitting: Endocrinology

## 2015-04-28 ENCOUNTER — Telehealth: Payer: Self-pay | Admitting: *Deleted

## 2015-04-28 NOTE — Telephone Encounter (Signed)
Patient ask you to return her call, it's about her medication. please advise

## 2015-04-28 NOTE — Telephone Encounter (Signed)
Patient said you told her to call here when she was almost out of her levemir, she has 2 pens left, your last office note did not say what you wanted to change it too? Please advise

## 2015-04-28 NOTE — Telephone Encounter (Signed)
[email or web address]

## 2015-04-28 NOTE — Telephone Encounter (Signed)
Please see below and advise.

## 2015-04-29 ENCOUNTER — Other Ambulatory Visit: Payer: Self-pay | Admitting: Endocrinology

## 2015-04-30 NOTE — Telephone Encounter (Signed)
40 units of Toujeo once a day

## 2015-05-01 ENCOUNTER — Other Ambulatory Visit: Payer: Self-pay | Admitting: *Deleted

## 2015-05-01 MED ORDER — INSULIN GLARGINE 300 UNIT/ML ~~LOC~~ SOPN: 40.0000 [IU] | PEN_INJECTOR | Freq: Every day | SUBCUTANEOUS | Status: DC

## 2015-05-01 NOTE — Telephone Encounter (Signed)
Noted, rx sent, patient aware 

## 2015-05-04 ENCOUNTER — Other Ambulatory Visit (INDEPENDENT_AMBULATORY_CARE_PROVIDER_SITE_OTHER): Payer: Federal, State, Local not specified - PPO

## 2015-05-04 DIAGNOSIS — IMO0002 Reserved for concepts with insufficient information to code with codable children: Secondary | ICD-10-CM

## 2015-05-04 DIAGNOSIS — E1165 Type 2 diabetes mellitus with hyperglycemia: Secondary | ICD-10-CM | POA: Diagnosis not present

## 2015-05-04 LAB — COMPREHENSIVE METABOLIC PANEL
ALT: 17 U/L (ref 0–35)
AST: 17 U/L (ref 0–37)
Albumin: 4.2 g/dL (ref 3.5–5.2)
Alkaline Phosphatase: 91 U/L (ref 39–117)
BUN: 23 mg/dL (ref 6–23)
CO2: 29 mEq/L (ref 19–32)
Calcium: 9.5 mg/dL (ref 8.4–10.5)
Chloride: 103 mEq/L (ref 96–112)
Creatinine, Ser: 0.79 mg/dL (ref 0.40–1.20)
GFR: 78.16 mL/min (ref >60.00)
Glucose, Bld: 137 mg/dL — ABNORMAL HIGH (ref 70–99)
Potassium: 4 mEq/L (ref 3.5–5.1)
Sodium: 139 mEq/L (ref 135–145)
Total Bilirubin: 0.4 mg/dL (ref 0.2–1.2)
Total Protein: 7.6 g/dL (ref 6.0–8.3)

## 2015-05-04 LAB — HEMOGLOBIN A1C: Hgb A1c MFr Bld: 8.9 % — ABNORMAL HIGH (ref 4.6–6.5)

## 2015-05-05 DIAGNOSIS — E0800 Diabetes mellitus due to underlying condition with hyperosmolarity without nonketotic hyperglycemic-hyperosmolar coma (NKHHC): Secondary | ICD-10-CM | POA: Diagnosis not present

## 2015-05-05 DIAGNOSIS — F329 Major depressive disorder, single episode, unspecified: Secondary | ICD-10-CM | POA: Diagnosis not present

## 2015-05-05 DIAGNOSIS — T7431XA Adult psychological abuse, confirmed, initial encounter: Secondary | ICD-10-CM | POA: Diagnosis not present

## 2015-05-05 DIAGNOSIS — I119 Hypertensive heart disease without heart failure: Secondary | ICD-10-CM | POA: Diagnosis not present

## 2015-05-09 ENCOUNTER — Ambulatory Visit (INDEPENDENT_AMBULATORY_CARE_PROVIDER_SITE_OTHER): Payer: Federal, State, Local not specified - PPO | Admitting: Endocrinology

## 2015-05-09 ENCOUNTER — Encounter: Payer: Self-pay | Admitting: Endocrinology

## 2015-05-09 VITALS — BP 134/82 | HR 70 | Temp 98.2°F | Resp 14 | Ht 63.0 in | Wt 173.2 lb

## 2015-05-09 DIAGNOSIS — E1165 Type 2 diabetes mellitus with hyperglycemia: Secondary | ICD-10-CM | POA: Diagnosis not present

## 2015-05-09 DIAGNOSIS — I10 Essential (primary) hypertension: Secondary | ICD-10-CM

## 2015-05-09 DIAGNOSIS — E785 Hyperlipidemia, unspecified: Secondary | ICD-10-CM | POA: Diagnosis not present

## 2015-05-09 DIAGNOSIS — IMO0002 Reserved for concepts with insufficient information to code with codable children: Secondary | ICD-10-CM

## 2015-05-09 NOTE — Progress Notes (Signed)
Patient ID: Linda Lowe, female   DOB: [birth year], 64 y.o.   MRN: [id number]   Reason for Appointment: Diabetes follow-up   History of Present Illness   Diagnosis: date of diagnosis: ? 2009  PAST history: Her diabetes has been usually poorly controlled. She has been on multiple medications including Victoza and basal bolus regimen also. However she  had difficulty with consistent compliance with insulin, diet and exercise regimen. With starting Victoza her blood sugars appeared to be improved. After repeated sessions with educators she was able to improve her compliance level and blood sugars had been improving since about 10/13. Insulin doses have been increased overall  A1c previously had been usually over 8% and below this for the first time in 2/14. She had been switched to a combination of Invokana and metformin instead of Janumet in late 2014 In 1/15 her blood sugars are excellent with A1c 7% In 2015 her blood sugars had been gradually more difficult to control with rising A1c and persistently over 8%  RECENT history:   Current insulin regimen: Levemir 22 units AT 6 AM AND 16 at supper. NOVOLOG pen 14 bid a.c.  Victoza 1.8 mg daily at lunch.   A1c is now slightly worse with A1c nearly 9% However her blood sugars at home and in the lab appear to be fairly good and not clear when she is having hyperglycemia  Current blood sugar patterns and problems identified:  Difficult to assess her blood sugar patterns as she is not checking blood sugars mostly midmorning and only occasionally in the evening  Also the patient now says that she is starting to eat breakfast in the morning which she was not doing an readings are about 3 hours after eating  She was told to switch Levemir to [facility] but she still is waiting to finish her Levemir supply  Mealtime coverage: She is still taking this only at lunch and supper time and not clear what her postprandial readings are, has only one reading of  about 190 after supper  She does not appear to have significantly high readings after breakfast, only one high reading of 223 which she thinks was right after eating  She thinks she is trying to exercise regularly and has lost about 5 pounds She taking her Victoza which she thinks she is compliant with 1.8 mg daily She was told to call before refilling her Levemir but she has not done so and still has 3 boxes of this  Hypoglycemia:    Glucometer: Contour.  Blood Glucose readings are being checked mostly before lunch and rarely at bedtime  Mean values apply above for all meters except median for One Touch  PRE-MEAL Fasting  11 AM  Dinner Bedtime Overall  Glucose range: ?    102-223    117-196    Mean/median:   134    152       DIET: Usually small portions  Meals: 3 meals per day usually at 11 a.m. and 7-8 p.m.  Physical activity: exercise: walks 15 min or bike daily Certified Diabetes Educator visit: Most recent:, 12/12.  Dietician visit: Most recent: 10/15  Retinal exam: Most recent:5/12.  Pneumococcal immunization: 12/12.   Wt Readings from Last 3 Encounters:  05/09/15 173 lb 3.2 oz (78.563 kg)  02/06/15 178 lb (80.74 kg)  11/07/14 172 lb (78.019 kg)   LABS:  Lab Results  Component Value Date   HGBA1C 8.9* 05/04/2015   HGBA1C 8.6* 02/01/2015   HGBA1C 9.0*  11/02/2014   Lab Results  Component Value Date   MICROALBUR 5.4* 11/02/2014   LDLCALC 85 06/06/2014   CREATININE 0.79 05/04/2015     Appointment on 05/04/2015  Component Date Value Ref Range Status  . Hgb A1c MFr Bld 05/04/2015 8.9* 4.6 - 6.5 % Final   Glycemic Control Guidelines for People with Diabetes:Non Diabetic:  <6%Goal of Therapy: <7%Additional Action Suggested:  >8%   . Sodium 05/04/2015 139  135 - 145 mEq/L Final  . Potassium 05/04/2015 4.0  3.5 - 5.1 mEq/L Final  . Chloride 05/04/2015 103  96 - 112 mEq/L Final  . CO2 05/04/2015 29  19 - 32 mEq/L Final  . Glucose, Bld 05/04/2015 137* 70 - 99  mg/dL Final  . BUN 76/16/0737 23  6 - 23 mg/dL Final  . Creatinine, Ser 05/04/2015 0.79  0.40 - 1.20 mg/dL Final  . Total Bilirubin 05/04/2015 0.4  0.2 - 1.2 mg/dL Final  . Alkaline Phosphatase 05/04/2015 91  39 - 117 U/L Final  . AST 05/04/2015 17  0 - 37 U/L Final  . ALT 05/04/2015 17  0 - 35 U/L Final  . Total Protein 05/04/2015 7.6  6.0 - 8.3 g/dL Final  . Albumin 10/62/6948 4.2  3.5 - 5.2 g/dL Final  . Calcium 54/62/7035 9.5  8.4 - 10.5 mg/dL Final  . GFR 00/93/8182 78.16  >60.00 mL/min Final      Medication List       This list is accurate as of: 05/09/15  9:20 PM.  Always use your most recent med list.               atorvastatin 40 MG tablet  Commonly known as:  LIPITOR  TAKE 1 TABLET (40 MG TOTAL) BY MOUTH DAILY.     atorvastatin 40 MG tablet  Commonly known as:  LIPITOR  TAKE 1 TABLET (40 MG TOTAL) BY MOUTH DAILY.     BAYER CONTOUR NEXT TEST test strip  Generic drug:  glucose blood  USE TO CHECK BLOOD SUGARS TWICE A DAY DX CODE 250.00     buPROPion 300 MG 24 hr tablet  Commonly known as:  WELLBUTRIN XL  Take 300 mg by mouth daily.     Canagliflozin-Metformin HCl [phone] MG Tabs  Take 1 tablet 2 times daily     carvedilol 12.5 MG tablet  Commonly known as:  COREG  Take 12.5 mg by mouth 2 (two) times daily with a meal.     [facility] LANCETS THIN Misc  Use as directed 5 times per day dx code E11.65     EPIPEN 0.3 mg/0.3 mL Devi  Generic drug:  EPINEPHrine  Inject 0.3 mg into the muscle as needed. For allergic reactions     furosemide 20 MG tablet  Commonly known as:  LASIX  Take 20 mg by mouth daily.     Insulin Glargine 300 UNIT/ML Sopn  Commonly known as:  TOUJEO SOLOSTAR  Inject 40 Units into the skin daily.     Insulin Pen Needle 31G X 5 MM Misc  Commonly known as:  B-D UF III MINI PEN NEEDLES  USE 5 PEN NEEDLES PER DAY AS INSTRUCTED     irbesartan 300 MG tablet  Commonly known as:  AVAPRO  Take 300 mg by mouth daily.     naproxen 500 MG tablet   Commonly known as:  NAPROSYN  Take 500 mg by mouth 2 (two) times daily with a meal.     NOVOLOG FLEXPEN  100 UNIT/ML FlexPen  Generic drug:  insulin aspart  INJECT 16 UNITS INTO THE SKIN 2 (TWO) TIMES DAILY BEFORE LUNCH AND SUPPER.     oxybutynin 5 MG 24 hr tablet  Commonly known as:  DITROPAN-XL  Take 5 mg by mouth daily.     VICTOZA 18 MG/3ML Sopn  Generic drug:  Liraglutide  INJECT 0.3 MLS SUBCUTANEOUSLY DAILY        Allergies: No Known Allergies  Past Medical History  Diagnosis Date  . Diabetes mellitus     a. Dx > 5 y ago  . Hypertension   . Hypercholesteremia   . Arthritis   . Cardiomyopathy     a. 05/2012 Echo: EF 30-35%, Gr 1 DD, mild LVH, mild MR, pericardial effusion  . Pericardial effusion     a. 05/2012 Echo: circumferential pericardial effusion, mostly 63mm, largest pocket posterior - 65mm, mild RA indentation, no RV collapse, no tamponade.    Past Surgical History  Procedure Laterality Date  . Abdominal hysterectomy    . Foot surgery    . Left and right heart catheterization with coronary angiogram  05/26/2012    Procedure: LEFT AND RIGHT HEART CATHETERIZATION WITH CORONARY ANGIOGRAM;  Surgeon: Tonny Bollman, MD;  Location: [facility] CATH LAB;  Service: Cardiovascular;;  . Bi-ventricular implantable cardioverter defibrillator N/A 12/01/2013    Procedure: BI-VENTRICULAR IMPLANTABLE CARDIOVERTER DEFIBRILLATOR  (CRT-D);  Surgeon: Duke Salvia, MD;  Location: [facility] CATH LAB;  Service: Cardiovascular;  Laterality: N/A;    Family History  Problem Relation Age of Onset  . Other      No history of CAD  . Tuberculosis Father     died when pt was young.    Social History:  reports that she has quit smoking. She does not have any smokeless tobacco history on file. She reports that she does not drink alcohol or use illicit drugs.  Review of Systems   Lipids: LDL controlled, taking Lipitor 40 mg, no recent labs available   Lab Results  Component Value Date   CHOL 162  06/06/2014   HDL 48.30 06/06/2014   LDLCALC 85 06/06/2014   LDLDIRECT 141.0 06/09/2013   TRIG 146.0 06/06/2014   CHOLHDL 3 06/06/2014    Hypertension: Has been on multiple medications, she follows with PCP  Taking 20mg  Lasix because of history of LV dysfunction, followed by cardiologist still near 9%  Joint pains: She states she takes Naprosyn about once a day, does not have any significant joint pains  No numbness or tingling in her feet. Diabetic foot exam was normal in 2/16   Examination:   BP 134/82 mmHg  Pulse 70  Temp(Src) 98.2 F (36.8 C)  Resp 14  Ht 5\' 3"  (1.6 m)  Wt 173 lb 3.2 oz (78.563 kg)  BMI 30.69 kg/m2  SpO2 95%  Body mass index is 30.69 kg/(m^2).    No pedal edema  Assesment:   Diabetes type 2, uncontrolled:   See history of present illness for current management and problems identified Currently in a regimen of basal bolus insulin, Victoza and Invokamet  Her blood sugars are poorly controlled with A1c still near 9% Again difficult to identify any high readings at home or in the lab Most likely she may be having some high readings after supper when she is usually not checking her readings Also lately has not checked readings when she wakes up; she thinks her readings mid morning or after a light breakfast  Recommended to the patient timing  of glucose monitoring and to use a variety of timings by rotation to get a better blood sugar pattern Switch to Toujeo 40 units in the morning instead of Levemir Discussed adjusting Toujeo based on fasting readings and consider reducing the dose if morning sugars are low normal, discussed  Will need to have her come back for short-term Review of her blood sugar patterns  HYPERTENSION: Fairly well controlled  Hyperlipidemia: We will get reports from her PCP for review  Medication management: Advised her not to take Naprosyn unless she is having joint pains    Patient Instructions  Check blood sugars on  waking up .Marland Kitchen 2-3 .Marland Kitchen times a week  Also check blood sugars about 2 hours after a meal and do this after different meals by rotation  Recommended blood sugar levels on waking up is 90-130 and about 2 hours after meal is 140-180 Please bring blood sugar monitor to each visit.  TOUJEO 40 UNITS IN AM ONCE DAILY, call if sugars go below 75  Novolog 16 at lunch and 18 at dinner  Take Naproxen only as needed for pain  Counseling time on subjects discussed above is over 50% of today's 25 minute visit   Lorena Benham 05/09/2015, 9:20 PM

## 2015-05-09 NOTE — Patient Instructions (Addendum)
Check blood sugars on waking up .Marland Kitchen 2-3 .Marland Kitchen times a week  Also check blood sugars about 2 hours after a meal and do this after different meals by rotation  Recommended blood sugar levels on waking up is 90-130 and about 2 hours after meal is 140-180 Please bring blood sugar monitor to each visit.  TOUJEO 40 UNITS IN AM ONCE DAILY, call if sugars go below 75  Novolog 16 at lunch and 18 at dinner  Take Naproxen only as needed for pain

## 2015-07-04 ENCOUNTER — Other Ambulatory Visit: Payer: Self-pay | Admitting: Endocrinology

## 2015-07-07 DIAGNOSIS — I10 Essential (primary) hypertension: Secondary | ICD-10-CM | POA: Diagnosis not present

## 2015-07-07 DIAGNOSIS — I429 Cardiomyopathy, unspecified: Secondary | ICD-10-CM | POA: Diagnosis not present

## 2015-07-07 DIAGNOSIS — I5042 Chronic combined systolic (congestive) and diastolic (congestive) heart failure: Secondary | ICD-10-CM | POA: Diagnosis not present

## 2015-07-07 DIAGNOSIS — Z95810 Presence of automatic (implantable) cardiac defibrillator: Secondary | ICD-10-CM | POA: Diagnosis not present

## 2015-07-10 ENCOUNTER — Other Ambulatory Visit: Payer: Self-pay | Admitting: Endocrinology

## 2015-07-12 ENCOUNTER — Telehealth: Payer: Self-pay | Admitting: Endocrinology

## 2015-07-12 ENCOUNTER — Other Ambulatory Visit: Payer: Self-pay | Admitting: *Deleted

## 2015-07-12 MED ORDER — INSULIN GLARGINE 300 UNIT/ML ~~LOC~~ SOPN: 40.0000 [IU] | PEN_INJECTOR | Freq: Every day | SUBCUTANEOUS | Status: DC

## 2015-07-12 NOTE — Telephone Encounter (Signed)
Toujeo was sent to patients pharmacy

## 2015-07-12 NOTE — Telephone Encounter (Signed)
Can the pt get the new insulin trajeo [facility]

## 2015-07-22 IMAGING — CR DG CHEST 2V
2 series · 2 of 2 positions shown · non-contrast
Comparison: Prior radiograph from 06/30/2013.

CLINICAL DATA: Post pacemaker insertion

EXAM:
CHEST  2 VIEW

[w chest pa]
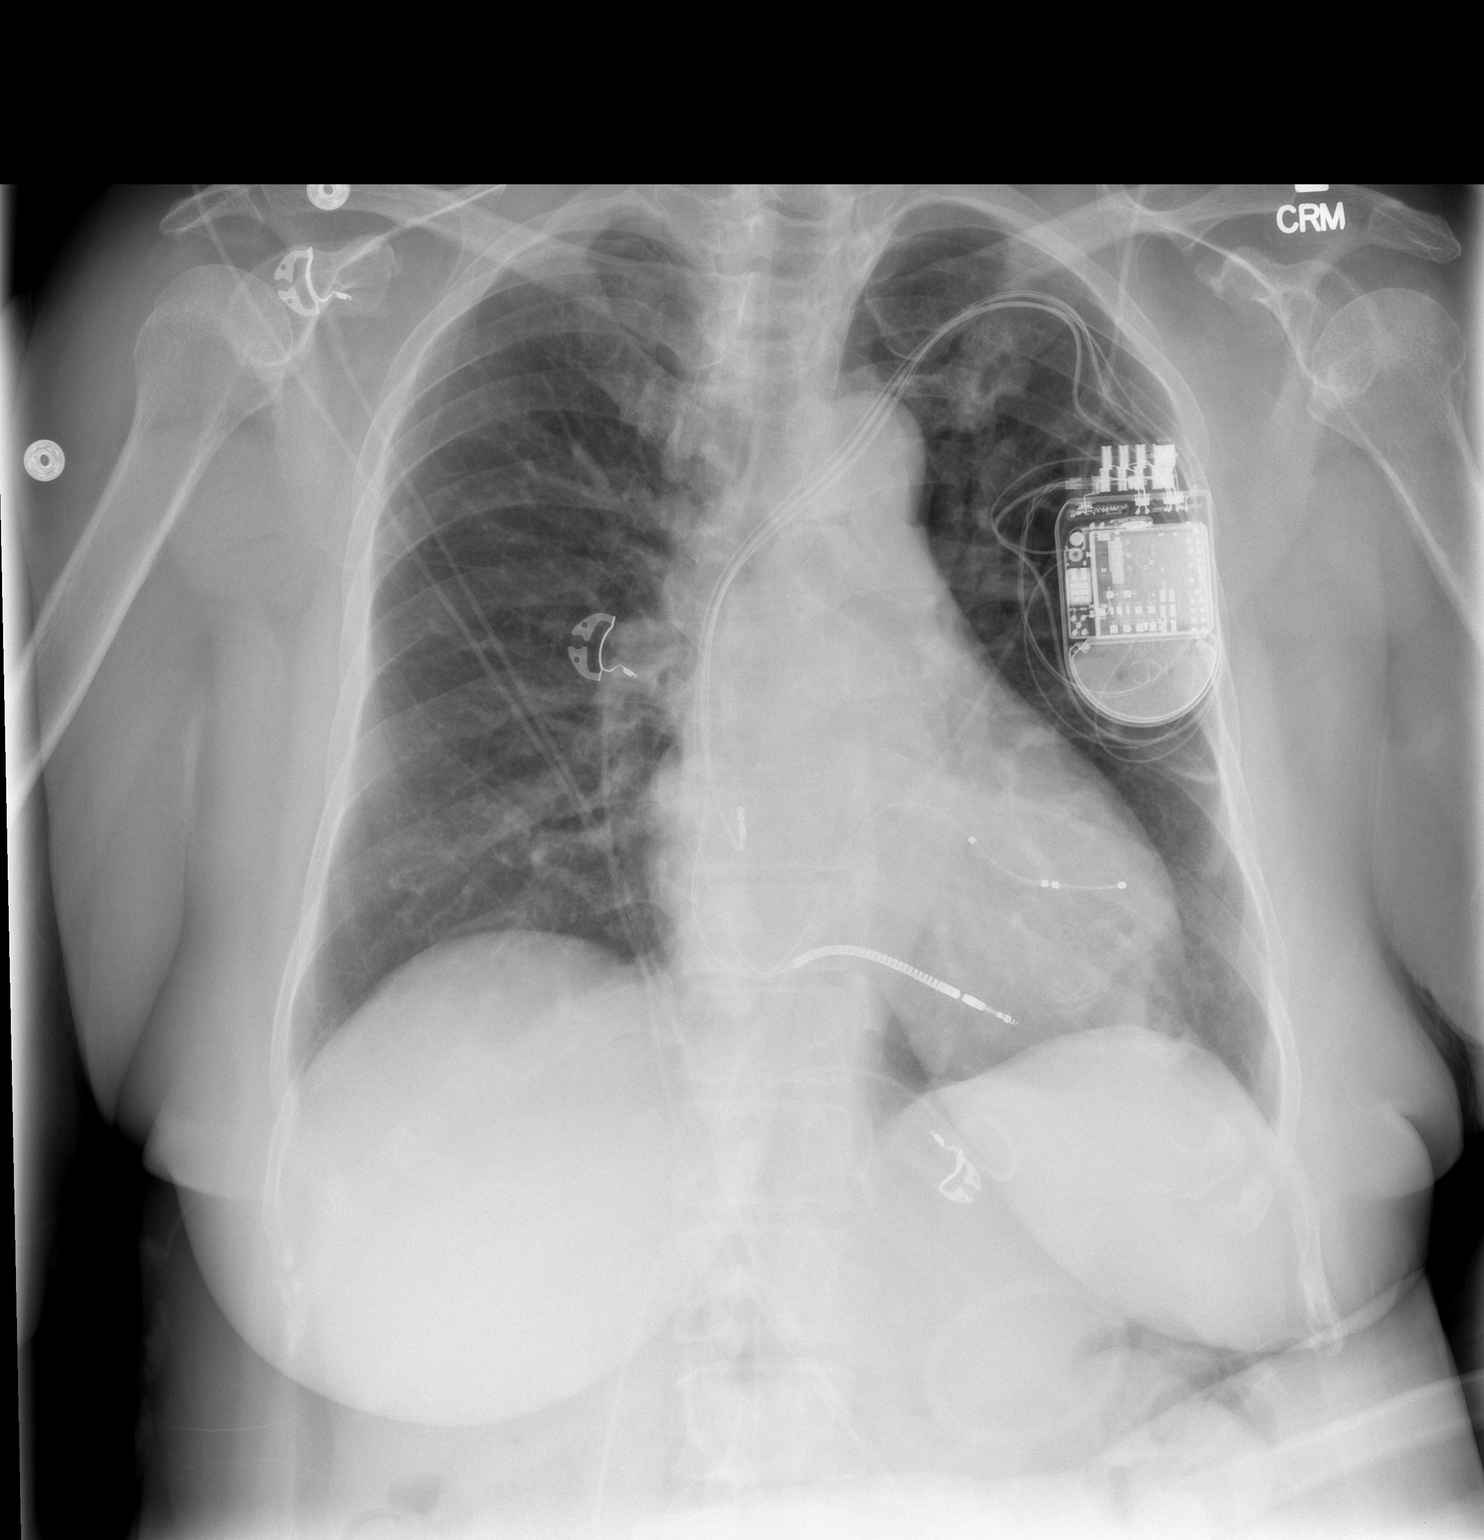

[w chest lat]
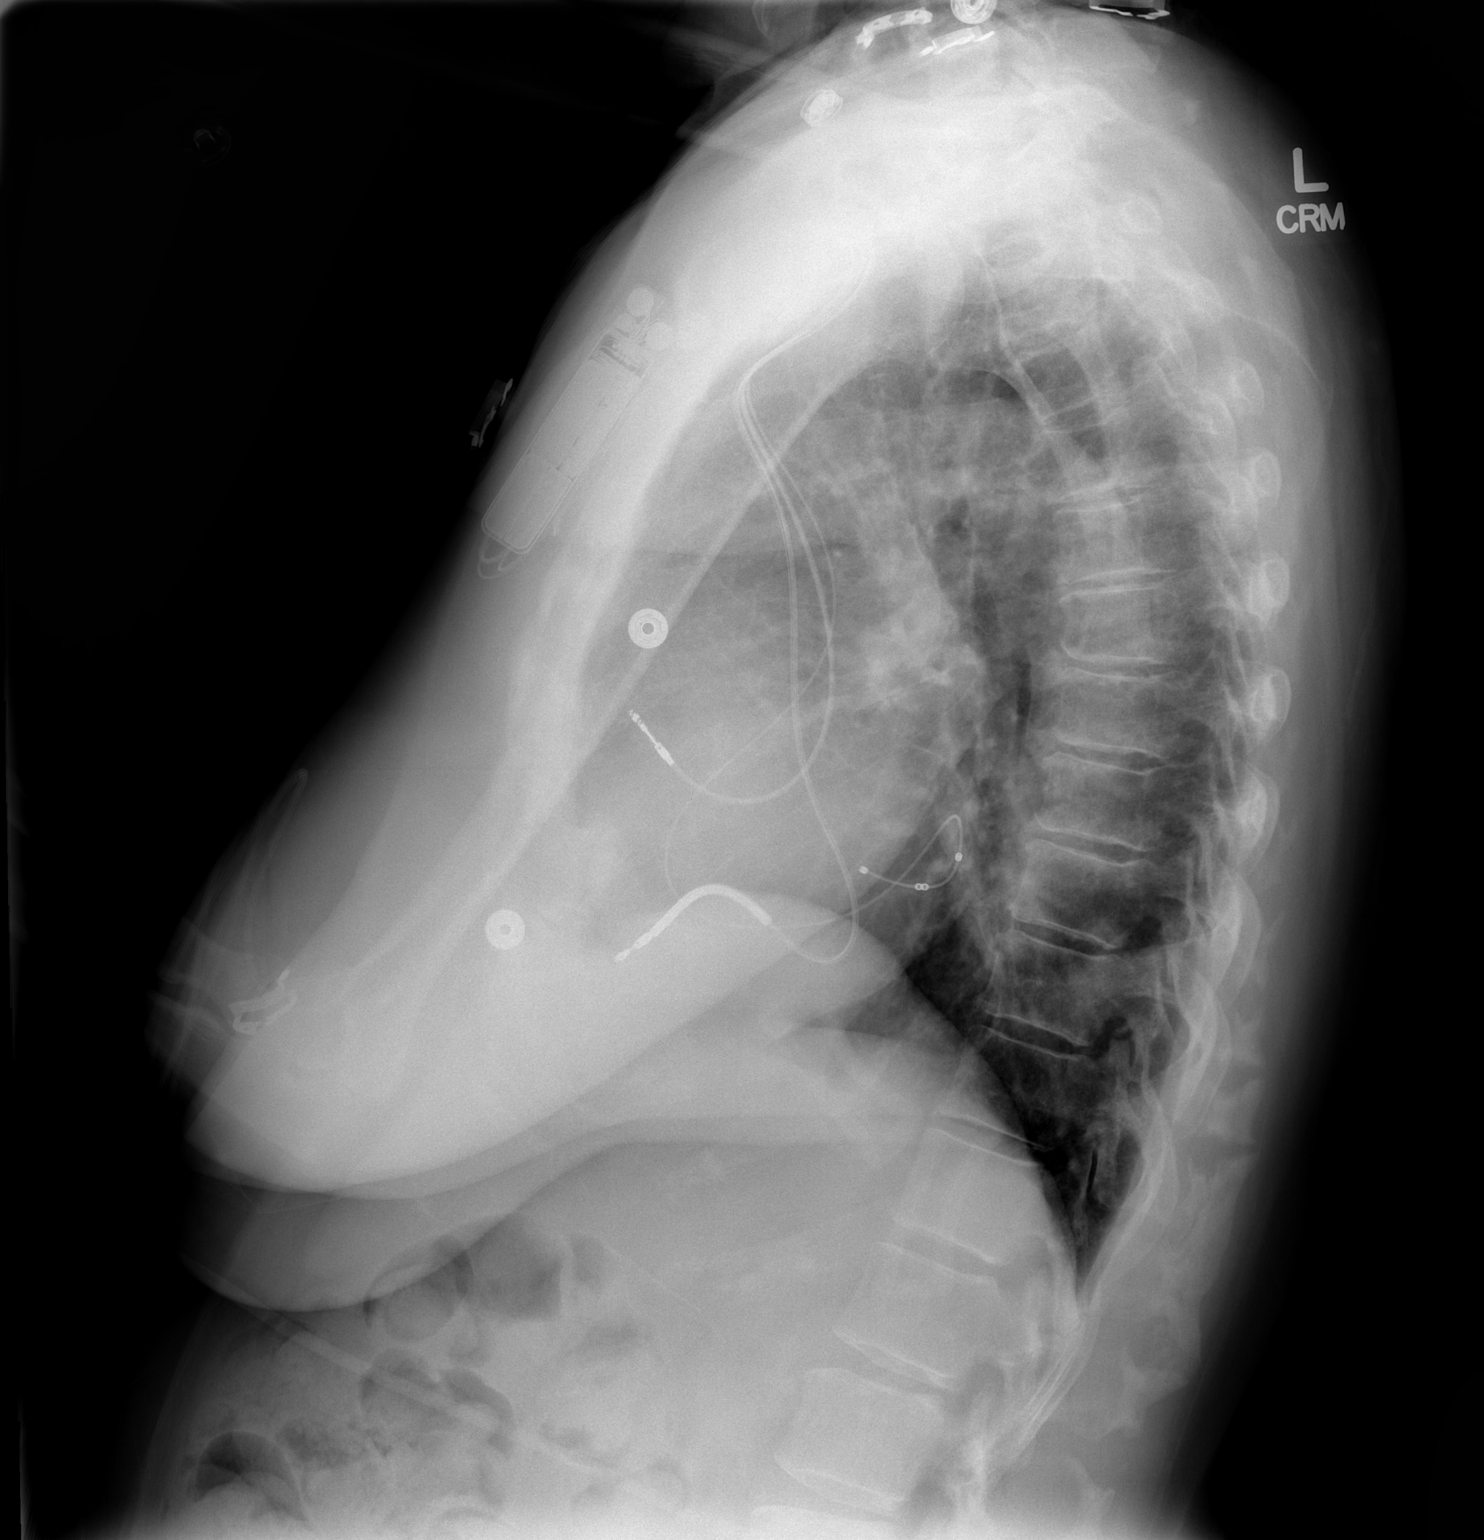

[2 of 2 positions shown; findings below may reference images not displayed]

FINDINGS: There has been interval placement of a left-sided transvenous
pacemaker/ AICD. The electrodes appear intact. No evidence of
complication. No pneumothorax.

Cardiac and mediastinal silhouettes are unchanged.

Lungs are normally inflated. No focal infiltrate, pulmonary edema,
or pleural effusion.

No acute osseus abnormality.
IMPRESSION: 1. Interval placement of left-sided transvenous pacemaker/AICD
without complication. No acute pneumothorax.
2. No acute cardiopulmonary abnormality.

## 2015-08-03 ENCOUNTER — Telehealth: Payer: Self-pay | Admitting: Cardiology

## 2015-08-03 NOTE — Telephone Encounter (Signed)
Patient called about [facility] Jude recall certified letter. Patient stated that she wanted to know more details about the recall. Pt has her device followed by Dr. Nadara Eaton. I instructed pt to call his office to see what they were doing about the recall. Pt verbalized understanding.

## 2015-08-13 ENCOUNTER — Other Ambulatory Visit: Payer: Self-pay | Admitting: Endocrinology

## 2015-08-14 ENCOUNTER — Other Ambulatory Visit: Payer: Self-pay | Admitting: Endocrinology

## 2015-08-15 ENCOUNTER — Other Ambulatory Visit: Payer: Self-pay

## 2015-08-15 ENCOUNTER — Other Ambulatory Visit (INDEPENDENT_AMBULATORY_CARE_PROVIDER_SITE_OTHER): Payer: Federal, State, Local not specified - PPO

## 2015-08-15 DIAGNOSIS — E1310 Other specified diabetes mellitus with ketoacidosis without coma: Secondary | ICD-10-CM | POA: Diagnosis not present

## 2015-08-15 LAB — COMPREHENSIVE METABOLIC PANEL
ALT: 18 U/L (ref 0–35)
AST: 19 U/L (ref 0–37)
Albumin: 4.1 g/dL (ref 3.5–5.2)
Alkaline Phosphatase: 88 U/L (ref 39–117)
BUN: 12 mg/dL (ref 6–23)
CO2: 34 mEq/L — ABNORMAL HIGH (ref 19–32)
Calcium: 9.4 mg/dL (ref 8.4–10.5)
Chloride: 100 mEq/L (ref 96–112)
Creatinine, Ser: 0.57 mg/dL (ref 0.40–1.20)
GFR: 113.81 mL/min (ref >60.00)
Glucose, Bld: 112 mg/dL — ABNORMAL HIGH (ref 70–99)
Potassium: 3.2 mEq/L — ABNORMAL LOW (ref 3.5–5.1)
Sodium: 142 mEq/L (ref 135–145)
Total Bilirubin: 0.6 mg/dL (ref 0.2–1.2)
Total Protein: 7.6 g/dL (ref 6.0–8.3)

## 2015-08-15 LAB — HEMOGLOBIN A1C: Hgb A1c MFr Bld: 8.6 % — ABNORMAL HIGH (ref 4.6–6.5)

## 2015-08-16 NOTE — Progress Notes (Signed)
Quick Note:  Please let patient know that potassium is low, start K-Dur 20 mEq daily ______

## 2015-08-17 ENCOUNTER — Other Ambulatory Visit: Payer: Self-pay | Admitting: *Deleted

## 2015-08-17 MED ORDER — POTASSIUM CHLORIDE CRYS ER 20 MEQ PO TBCR: 20.0000 meq | EXTENDED_RELEASE_TABLET | Freq: Every day | ORAL | Status: DC

## 2015-08-18 ENCOUNTER — Ambulatory Visit (INDEPENDENT_AMBULATORY_CARE_PROVIDER_SITE_OTHER): Payer: Federal, State, Local not specified - PPO | Admitting: Endocrinology

## 2015-08-18 ENCOUNTER — Encounter: Payer: Self-pay | Admitting: Endocrinology

## 2015-08-18 VITALS — BP 138/72 | HR 67 | Temp 98.3°F | Resp 14 | Ht 63.0 in | Wt 174.2 lb

## 2015-08-18 DIAGNOSIS — Z23 Encounter for immunization: Secondary | ICD-10-CM

## 2015-08-18 DIAGNOSIS — E785 Hyperlipidemia, unspecified: Secondary | ICD-10-CM | POA: Diagnosis not present

## 2015-08-18 DIAGNOSIS — E1165 Type 2 diabetes mellitus with hyperglycemia: Secondary | ICD-10-CM

## 2015-08-18 DIAGNOSIS — E876 Hypokalemia: Secondary | ICD-10-CM | POA: Diagnosis not present

## 2015-08-18 DIAGNOSIS — Z794 Long term (current) use of insulin: Secondary | ICD-10-CM

## 2015-08-18 NOTE — Patient Instructions (Signed)
Check blood sugars on waking up 2-3  times a week  Also check blood sugars about 2 hours after a meal and do this after different meals by rotation  Recommended blood sugar levels on waking up is 90-130 and about 2 hours after meal is 130-160  Please bring your blood sugar monitor to each visit, thank you  Novolog 10 units IN AM IF EATING BREAKFAST;   Reduce Novolog to 14 at lunch  Dinner if eating bigger meals or more starches take 20 Novolog

## 2015-08-18 NOTE — Progress Notes (Signed)
Patient ID: Linda Lowe, female   DOB: 06/19/1951, 64 y.o.   MRN: [id number]   Reason for Appointment: Diabetes follow-up   History of Present Illness   Diagnosis: date of diagnosis: ? 2009  RECENT history:  Current insulin regimen:      TOUJEO 40 UNITS IN AM ONCE DAILY  Novolog 16 at lunch and 18 at dinner  Victoza 1.8 mg daily at lunch.   A1c is now slightly better with that result 8.6, previously 8.9 However her blood sugars at home and in the lab appear to be fairly good again and not clear when she is having hyperglycemia  Current blood sugar patterns and problems identified:  She has switched from Levemir to [facility] since her last visit but she has not done any fasting readings to assess this.  No overnight hypoglycemia reported  She is having fairly good blood sugars after her lunch and dinner but relatively lower after lunch.  She has had a couple of low readings after lunch and once after dinner also  He probably has variable meal size and carbohydrate intake causing some fluctuation  Also compliant with Invokamet  She thinks she is trying to exercise regularly She taking her Victoza which she thinks she is compliant with 1.8 mg daily  Hypoglycemia: as above, lowest reading 53   Glucometer: Contour.  Blood Glucose readings are being checked mostly after lunch and dinner or at bedtime  AVERAGE glucose overall 115  POST-MEAL PC Breakfast PC Lunch PC Dinner  Glucose range:  66-146 53-204  Mean/median:  105 131  '  DIET: Usually small portions  Meals: 3 meals per day usually at 11 a.m. and 7-8 p.m.  Physical activity: exercise: walks 15 min 4/7  Certified Diabetes Educator visit: Most recent:, 12/12.  Dietician visit: Most recent: 10/15     Wt Readings from Last 3 Encounters:  08/18/15 174 lb 3.2 oz (79.017 kg)  05/09/15 173 lb 3.2 oz (78.563 kg)  02/06/15 178 lb (80.74 kg)   LABS:  Lab Results  Component Value Date   HGBA1C 8.6*  08/15/2015   HGBA1C 8.9* 05/04/2015   HGBA1C 8.6* 02/01/2015   Lab Results  Component Value Date   MICROALBUR 5.4* 11/02/2014   LDLCALC 85 06/06/2014   CREATININE 0.57 08/15/2015     PAST history: Her diabetes has been usually poorly controlled. She has been on multiple medications including Victoza and basal bolus regimen also. However she  had difficulty with consistent compliance with insulin, diet and exercise regimen. With starting Victoza her blood sugars appeared to be improved. After repeated sessions with educators she was able to improve her compliance level and blood sugars had been improving since about 10/13. Insulin doses have been increased overall  A1c previously had been usually over 8% and below this for the first time in 2/14. She had been switched to a combination of Invokana and metformin instead of Janumet in late 2014 In 1/15 her blood sugars are excellent with A1c 7% In 2015 her blood sugars had been gradually more difficult to control with rising A1c and persistently over 8%     Lab on 08/15/2015  Component Date Value Ref Range Status  . Sodium 08/15/2015 142  135 - 145 mEq/L Final  . Potassium 08/15/2015 3.2* 3.5 - 5.1 mEq/L Final  . Chloride 08/15/2015 100  96 - 112 mEq/L Final  . CO2 08/15/2015 34* 19 - 32 mEq/L Final  . Glucose, Bld 08/15/2015 112* 70 -  99 mg/dL Final  . BUN 49/44/9675 12  6 - 23 mg/dL Final  . Creatinine, Ser 08/15/2015 0.57  0.40 - 1.20 mg/dL Final  . Total Bilirubin 08/15/2015 0.6  0.2 - 1.2 mg/dL Final  . Alkaline Phosphatase 08/15/2015 88  39 - 117 U/L Final  . AST 08/15/2015 19  0 - 37 U/L Final  . ALT 08/15/2015 18  0 - 35 U/L Final  . Total Protein 08/15/2015 7.6  6.0 - 8.3 g/dL Final  . Albumin 91/63/8466 4.1  3.5 - 5.2 g/dL Final  . Calcium 59/93/5701 9.4  8.4 - 10.5 mg/dL Final  . GFR 77/93/9030 113.81  >60.00 mL/min Final  . Hgb A1c MFr Bld 08/15/2015 8.6* 4.6 - 6.5 % Final   Glycemic Control Guidelines for People with  Diabetes:Non Diabetic:  <6%Goal of Therapy: <7%Additional Action Suggested:  >8%       Medication List       This list is accurate as of: 08/18/15  9:10 AM.  Always use your most recent med list.               atorvastatin 40 MG tablet  Commonly known as:  LIPITOR  TAKE 1 TABLET (40 MG TOTAL) BY MOUTH DAILY.     BAYER CONTOUR NEXT TEST test strip  Generic drug:  glucose blood  USE TO CHECK BLOOD SUGARS TWICE A DAY DX CODE 250.00     buPROPion 300 MG 24 hr tablet  Commonly known as:  WELLBUTRIN XL  Take 300 mg by mouth daily.     carvedilol 25 MG tablet  Commonly known as:  COREG  Take 25 mg by mouth 2 (two) times daily.     [facility] LANCETS THIN Misc  Use as directed 5 times per day dx code E11.65     EPIPEN 0.3 mg/0.3 mL Devi  Generic drug:  EPINEPHrine  Inject 0.3 mg into the muscle as needed. For allergic reactions     furosemide 20 MG tablet  Commonly known as:  LASIX  Take 20 mg by mouth daily.     Insulin Glargine 300 UNIT/ML Sopn  Commonly known as:  TOUJEO SOLOSTAR  Inject 40 Units into the skin daily.     Insulin Pen Needle 31G X 5 MM Misc  Commonly known as:  B-D UF III MINI PEN NEEDLES  USE 5 PEN NEEDLES PER DAY AS INSTRUCTED     INVOKAMET [phone] MG Tabs  Generic drug:  Canagliflozin-Metformin HCl  TAKE 1 TABLET 2 TIMES DAILY     irbesartan 300 MG tablet  Commonly known as:  AVAPRO  Take 300 mg by mouth daily.     naproxen 500 MG tablet  Commonly known as:  NAPROSYN  Take 500 mg by mouth 2 (two) times daily with a meal.     NOVOLOG FLEXPEN 100 UNIT/ML FlexPen  Generic drug:  insulin aspart  INJECT 16 UNITS INTO THE SKIN 2 (TWO) TIMES DAILY BEFORE LUNCH AND SUPPER.     oxybutynin 5 MG 24 hr tablet  Commonly known as:  DITROPAN-XL  Take 5 mg by mouth daily.     potassium chloride SA 20 MEQ tablet  Commonly known as:  K-DUR,KLOR-CON  Take 1 tablet (20 mEq total) by mouth daily.     VICTOZA 18 MG/3ML Sopn  Generic drug:  Liraglutide  INJECT  0.3 MLS SUBCUTANEOUSLY DAILY        Allergies: No Known Allergies  Past Medical History  Diagnosis Date  .  Diabetes mellitus     a. Dx > 5 y ago  . Hypertension   . Hypercholesteremia   . Arthritis   . Cardiomyopathy (HCC)     a. 05/2012 Echo: EF 30-35%, Gr 1 DD, mild LVH, mild MR, pericardial effusion  . Pericardial effusion     a. 05/2012 Echo: circumferential pericardial effusion, mostly 79mm, largest pocket posterior - 104mm, mild RA indentation, no RV collapse, no tamponade.    Past Surgical History  Procedure Laterality Date  . Abdominal hysterectomy    . Foot surgery    . Left and right heart catheterization with coronary angiogram  05/26/2012    Procedure: LEFT AND RIGHT HEART CATHETERIZATION WITH CORONARY ANGIOGRAM;  Surgeon: Tonny Bollman, MD;  Location: [facility] CATH LAB;  Service: Cardiovascular;;  . Bi-ventricular implantable cardioverter defibrillator N/A 12/01/2013    Procedure: BI-VENTRICULAR IMPLANTABLE CARDIOVERTER DEFIBRILLATOR  (CRT-D);  Surgeon: Duke Salvia, MD;  Location: [facility] CATH LAB;  Service: Cardiovascular;  Laterality: N/A;    Family History  Problem Relation Age of Onset  . Other      No history of CAD  . Tuberculosis Father     died when pt was young.    Social History:  reports that she has quit smoking. She does not have any smokeless tobacco history on file. She reports that she does not drink alcohol or use illicit drugs.  Review of Systems   Lipids: LDL controlled, taking Lipitor 40 mg, no recent labs available from PCPs office   Lab Results  Component Value Date   CHOL 162 06/06/2014   HDL 48.30 06/06/2014   LDLCALC 85 06/06/2014   LDLDIRECT 141.0 06/09/2013   TRIG 146.0 06/06/2014   CHOLHDL 3 06/06/2014    Hypertension: Has been on multiple medications, she follows with PCP and cardiologist  Taking 20mg  Lasix because of history of LV dysfunction, followed by cardiologist   HYPOKALEMIA: Her potassium is low recently and she has been  started on supplements  Joint pains: She states she takes Naprosyn as needed, does not have any significant joint pains  Diabetic foot exam was normal in 2/16   Examination:   BP 138/72 mmHg  Pulse 67  Temp(Src) 98.3 F (36.8 C)  Resp 14  Ht 5\' 3"  (1.6 m)  Wt 174 lb 3.2 oz (79.017 kg)  BMI 30.87 kg/m2  SpO2 97%  Body mass index is 30.87 kg/(m^2).    No pedal edema  Assesment:   Diabetes type 2, uncontrolled:   See history of present illness for detailed discussion of his current management, blood sugar patterns and problems identified Currently in a regimen of basal bolus insulin, Victoza and Invokamet She is doing somewhat better with Toujeo compared to Levemir twice a day although fasting readings are not available to assess this Even though her A1c is over 8% she does not have any significantly high readings at home with checking twice a day Will need to do a fructosamine to assess her glucose control to different way  For now we will adjust her insulin as in patient instructions based on her meal intake and carbohydrates Will reduce her lunchtime dose by 2 units Since she is sometimes eating breakfast also she will need to take about 10 units of Novolog to cover this She will call if she has any unusually high or low readings especially overnight  HYPERTENSION: Fairly well controlled  Hyperlipidemia: We will get reports from her PCP for review  Influenza vaccine given  Patient Instructions  Check blood sugars on waking up 2-3  times a week  Also check blood sugars about 2 hours after a meal and do this after different meals by rotation  Recommended blood sugar levels on waking up is 90-130 and about 2 hours after meal is 130-160  Please bring your blood sugar monitor to each visit, thank you  Novolog 10 units IN AM IF EATING BREAKFAST;   Reduce Novolog to 14 at lunch  Dinner if eating bigger meals or more starches take 20 Novolog  Counseling time on  subjects discussed above is over 50% of today's 25 minute visit   Lorana Maffeo 08/18/2015, 9:10 AM

## 2015-09-07 ENCOUNTER — Telehealth: Payer: Self-pay | Admitting: Endocrinology

## 2015-09-07 ENCOUNTER — Other Ambulatory Visit: Payer: Self-pay | Admitting: *Deleted

## 2015-09-07 MED ORDER — BAYER MICROLET LANCETS MISC: Status: DC

## 2015-09-07 NOTE — Telephone Encounter (Signed)
rx sent

## 2015-09-07 NOTE — Telephone Encounter (Signed)
Pt needs lancets please call to [facility]

## 2015-10-16 ENCOUNTER — Other Ambulatory Visit: Payer: Self-pay | Admitting: Endocrinology

## 2015-10-20 ENCOUNTER — Other Ambulatory Visit: Payer: Self-pay | Admitting: *Deleted

## 2015-10-20 ENCOUNTER — Telehealth: Payer: Self-pay | Admitting: Endocrinology

## 2015-10-20 MED ORDER — INSULIN PEN NEEDLE 31G X 5 MM MISC: Status: DC

## 2015-10-20 NOTE — Telephone Encounter (Signed)
-  rx was sent earlier today

## 2015-10-20 NOTE — Telephone Encounter (Signed)
Pt needs pen needles sent to [facility] please

## 2015-11-13 ENCOUNTER — Other Ambulatory Visit (INDEPENDENT_AMBULATORY_CARE_PROVIDER_SITE_OTHER): Payer: Federal, State, Local not specified - PPO

## 2015-11-13 DIAGNOSIS — Z794 Long term (current) use of insulin: Secondary | ICD-10-CM

## 2015-11-13 DIAGNOSIS — E785 Hyperlipidemia, unspecified: Secondary | ICD-10-CM

## 2015-11-13 DIAGNOSIS — E1165 Type 2 diabetes mellitus with hyperglycemia: Secondary | ICD-10-CM

## 2015-11-13 LAB — BASIC METABOLIC PANEL
BUN: 13 mg/dL (ref 6–23)
CO2: 30 mEq/L (ref 19–32)
Calcium: 9.7 mg/dL (ref 8.4–10.5)
Chloride: 102 mEq/L (ref 96–112)
Creatinine, Ser: 0.69 mg/dL (ref 0.40–1.20)
GFR: 91.22 mL/min (ref >60.00)
Glucose, Bld: 152 mg/dL — ABNORMAL HIGH (ref 70–99)
Potassium: 3.9 mEq/L (ref 3.5–5.1)
Sodium: 140 mEq/L (ref 135–145)

## 2015-11-13 LAB — LIPID PANEL
Cholesterol: 150 mg/dL (ref 0–200)
HDL: 45.9 mg/dL (ref >39.00)
LDL Cholesterol: 83 mg/dL (ref 0–99)
NonHDL: 103.78
Total CHOL/HDL Ratio: 3
Triglycerides: 104 mg/dL (ref 0.0–149.0)
VLDL: 20.8 mg/dL (ref 0.0–40.0)

## 2015-11-13 LAB — MICROALBUMIN / CREATININE URINE RATIO
Creatinine,U: 91.4 mg/dL
Microalb Creat Ratio: 10.2 mg/g (ref 0.0–30.0)
Microalb, Ur: 9.3 mg/dL — ABNORMAL HIGH (ref 0.0–1.9)

## 2015-11-13 LAB — HEMOGLOBIN A1C: Hgb A1c MFr Bld: 8.1 % — ABNORMAL HIGH (ref 4.6–6.5)

## 2015-11-16 ENCOUNTER — Ambulatory Visit: Payer: Federal, State, Local not specified - PPO | Admitting: Endocrinology

## 2015-11-21 ENCOUNTER — Encounter: Payer: Self-pay | Admitting: Endocrinology

## 2015-11-21 ENCOUNTER — Ambulatory Visit (INDEPENDENT_AMBULATORY_CARE_PROVIDER_SITE_OTHER): Payer: Federal, State, Local not specified - PPO | Admitting: Endocrinology

## 2015-11-21 VITALS — BP 136/84 | HR 76 | Temp 98.0°F | Resp 16 | Ht 63.0 in | Wt 174.8 lb

## 2015-11-21 DIAGNOSIS — E1165 Type 2 diabetes mellitus with hyperglycemia: Secondary | ICD-10-CM | POA: Diagnosis not present

## 2015-11-21 DIAGNOSIS — Z794 Long term (current) use of insulin: Secondary | ICD-10-CM | POA: Diagnosis not present

## 2015-11-21 DIAGNOSIS — E785 Hyperlipidemia, unspecified: Secondary | ICD-10-CM

## 2015-11-21 NOTE — Patient Instructions (Addendum)
Bike daily  Check blood sugars on waking up 3  times a week Also check blood sugars about 2 hours after a meal and do this after different meals by rotation  Recommended blood sugar levels on waking up is 90-130 and about 2 hours after meal is 130-180  Please bring your blood sugar monitor to each visit, thank you  Cut rice portions in 1/2, small portions of sweets  Mark after meal sugars

## 2015-11-21 NOTE — Progress Notes (Signed)
Patient ID: Linda Lowe, female   DOB: 13-Apr-1951, 65 y.o.   MRN: [id number]   Reason for Appointment: Diabetes follow-up   History of Present Illness   Diagnosis: date of diagnosis: ? 2009  RECENT history:  Current insulin regimen:      TOUJEO 40 UNITS IN AM ONCE DAILY  Novolog 16 at lunch and 18 at dinner  Victoza 1.8 mg daily at lunch.   A1c is now slightly better at 8.1, as high as 8.9 previously  Current blood sugar patterns and problems identified:  She has checked her sugar at various times but mostly in the evenings  Difficult to know whether her evening readings are before or after meals Sometimes she will check sugars less than 2 hours after a meal.  Also she does not know why there is some variability in the evening readings with several good readings and some significantly high readings  She also checks some readings before and after her first meal and they are mildly increased but not consistent.  Early morning readings are fairly good  No hypoglycemia recently  She has not been a exercise as much  She now says that she is usually eating 2 cups of rice in the evening  She taking her Victoza which she thinks she is compliant with 1.8 mg daily  Hypoglycemia: None   Glucometer: Contour.  Blood Glucose readings are being checked mostly after lunch and dinner or at bedtime  Mean values apply above for all meters except median for One Touch  PRE-MEAL Fasting 2-3 PM  8-9 PM  Bedtime Overall  Glucose range: 90-163  132-290  108-345     Mean/median:   180   162     DIET: Usually small portions  Meals: 3 meals per day usually at 11 a.m. and 7-8 p.m.  Physical activity: exercise: bike 2/7  Certified Diabetes Educator visit: Most recent:, 12/12.  Dietician visit: Most recent: 10/15     Wt Readings from Last 3 Encounters:  11/21/15 174 lb 12.8 oz (79.289 kg)  08/18/15 174 lb 3.2 oz (79.017 kg)  05/09/15 173 lb 3.2 oz (78.563 kg)    LABS:  Lab Results  Component Value Date   HGBA1C 8.1* 11/13/2015   HGBA1C 8.6* 08/15/2015   HGBA1C 8.9* 05/04/2015   Lab Results  Component Value Date   MICROALBUR 9.3* 11/13/2015   LDLCALC 83 11/13/2015   CREATININE 0.69 11/13/2015     PAST history: Her diabetes has been usually poorly controlled. She has been on multiple medications including Victoza and basal bolus regimen also. However she  had difficulty with consistent compliance with insulin, diet and exercise regimen. With starting Victoza her blood sugars appeared to be improved. After repeated sessions with educators she was able to improve her compliance level and blood sugars had been improving since about 10/13. Insulin doses have been increased overall  A1c previously had been usually over 8% and below this for the first time in 2/14. She had been switched to a combination of Invokana and metformin instead of Janumet in late 2014 In 1/15 her blood sugars are excellent with A1c 7% In 2015 her blood sugars had been gradually more difficult to control with rising A1c and persistently over 8%     No visits with results within 1 Week(s) from this visit. Latest known visit with results is:  Lab on 11/13/2015  Component Date Value Ref Range Status  . Hgb A1c MFr Bld 11/13/2015 8.1* 4.6 -  6.5 % Final   Glycemic Control Guidelines for People with Diabetes:Non Diabetic:  <6%Goal of Therapy: <7%Additional Action Suggested:  >8%   . Sodium 11/13/2015 140  135 - 145 mEq/L Final  . Potassium 11/13/2015 3.9  3.5 - 5.1 mEq/L Final  . Chloride 11/13/2015 102  96 - 112 mEq/L Final  . CO2 11/13/2015 30  19 - 32 mEq/L Final  . Glucose, Bld 11/13/2015 152* 70 - 99 mg/dL Final  . BUN 37/48/2707 13  6 - 23 mg/dL Final  . Creatinine, Ser 11/13/2015 0.69  0.40 - 1.20 mg/dL Final  . Calcium 86/75/4492 9.7  8.4 - 10.5 mg/dL Final  . GFR 01/00/7121 91.22  >60.00 mL/min Final  . Microalb, Ur 11/13/2015 9.3* 0.0 - 1.9 mg/dL Final  .  Creatinine,U 11/13/2015 91.4   Final  . Microalb Creat Ratio 11/13/2015 10.2  0.0 - 30.0 mg/g Final  . Cholesterol 11/13/2015 150  0 - 200 mg/dL Final   ATP III Classification       Desirable:  < 200 mg/dL               Borderline High:  200 - 239 mg/dL          High:  > = 975 mg/dL  . Triglycerides 11/13/2015 104.0  0.0 - 149.0 mg/dL Final   Normal:  <883 mg/dLBorderline High:  150 - 199 mg/dL  . HDL 11/13/2015 45.90  >39.00 mg/dL Final  . VLDL 25/49/8264 20.8  0.0 - 40.0 mg/dL Final  . LDL Cholesterol 11/13/2015 83  0 - 99 mg/dL Final  . Total CHOL/HDL Ratio 11/13/2015 3   Final                  Men          Women1/2 Average Risk     3.4          3.3Average Risk          5.0          4.42X Average Risk          9.6          7.13X Average Risk          15.0          11.0                      . NonHDL 11/13/2015 103.78   Final   NOTE:  Non-HDL goal should be 30 mg/dL higher than patient's LDL goal (i.e. LDL goal of < 70 mg/dL, would have non-HDL goal of < 100 mg/dL)      Medication List       This list is accurate as of: 11/21/15  3:21 PM.  Always use your most recent med list.               atorvastatin 40 MG tablet  Commonly known as:  LIPITOR  TAKE 1 TABLET (40 MG TOTAL) BY MOUTH DAILY.     BAYER CONTOUR NEXT TEST test strip  Generic drug:  glucose blood  TEST BLOOD SUGAR TWICE DAILY AS DIRECTED     BAYER MICROLET LANCETS lancets  Use as instructed to check blood sugar 2 times per day dx code E11.65     buPROPion 300 MG 24 hr tablet  Commonly known as:  WELLBUTRIN XL  Take 300 mg by mouth daily.     carvedilol 25 MG tablet  Commonly known as:  COREG  Take 25 mg by mouth 2 (two) times daily.     EPIPEN 0.3 mg/0.3 mL Devi  Generic drug:  EPINEPHrine  Inject 0.3 mg into the muscle as needed. For allergic reactions     furosemide 20 MG tablet  Commonly known as:  LASIX  Take 20 mg by mouth daily.     Insulin Glargine 300 UNIT/ML Sopn  Commonly known as:  TOUJEO  SOLOSTAR  Inject 40 Units into the skin daily.     Insulin Pen Needle 31G X 5 MM Misc  Commonly known as:  B-D UF III MINI PEN NEEDLES  USE 5 PEN NEEDLES PER DAY AS INSTRUCTED     INVOKAMET [phone] MG Tabs  Generic drug:  Canagliflozin-Metformin HCl  TAKE 1 TABLET 2 TIMES DAILY     irbesartan 300 MG tablet  Commonly known as:  AVAPRO  Take 300 mg by mouth daily.     naproxen 500 MG tablet  Commonly known as:  NAPROSYN  Take 500 mg by mouth 2 (two) times daily with a meal.     NOVOLOG FLEXPEN 100 UNIT/ML FlexPen  Generic drug:  insulin aspart  INJECT 16 UNITS INTO THE SKIN 2 (TWO) TIMES DAILY BEFORE LUNCH AND SUPPER.     oxybutynin 5 MG 24 hr tablet  Commonly known as:  DITROPAN-XL  Take 5 mg by mouth daily.     potassium chloride SA 20 MEQ tablet  Commonly known as:  K-DUR,KLOR-CON  Take 1 tablet (20 mEq total) by mouth daily.     VICTOZA 18 MG/3ML Sopn  Generic drug:  Liraglutide  INJECT 0.3 MLS SUBCUTANEOUSLY DAILY        Allergies: No Known Allergies  Past Medical History  Diagnosis Date  . Diabetes mellitus     a. Dx > 5 y ago  . Hypertension   . Hypercholesteremia   . Arthritis   . Cardiomyopathy (HCC)     a. 05/2012 Echo: EF 30-35%, Gr 1 DD, mild LVH, mild MR, pericardial effusion  . Pericardial effusion     a. 05/2012 Echo: circumferential pericardial effusion, mostly 46mm, largest pocket posterior - 31mm, mild RA indentation, no RV collapse, no tamponade.    Past Surgical History  Procedure Laterality Date  . Abdominal hysterectomy    . Foot surgery    . Left and right heart catheterization with coronary angiogram  05/26/2012    Procedure: LEFT AND RIGHT HEART CATHETERIZATION WITH CORONARY ANGIOGRAM;  Surgeon: Tonny Bollman, MD;  Location: [facility] CATH LAB;  Service: Cardiovascular;;  . Bi-ventricular implantable cardioverter defibrillator N/A 12/01/2013    Procedure: BI-VENTRICULAR IMPLANTABLE CARDIOVERTER DEFIBRILLATOR  (CRT-D);  Surgeon: Duke Salvia,  MD;  Location: [facility] CATH LAB;  Service: Cardiovascular;  Laterality: N/A;    Family History  Problem Relation Age of Onset  . Other      No history of CAD  . Tuberculosis Father     died when pt was young.    Social History:  reports that she has quit smoking. She does not have any smokeless tobacco history on file. She reports that she does not drink alcohol or use illicit drugs.  Review of Systems   Lipids: LDL controlled, taking Lipitor 40 mg   Lab Results  Component Value Date   CHOL 150 11/13/2015   HDL 45.90 11/13/2015   LDLCALC 83 11/13/2015   LDLDIRECT 141.0 06/09/2013   TRIG 104.0 11/13/2015   CHOLHDL 3 11/13/2015    Hypertension: Has been on multiple medications,  she follows with PCP and cardiologist  Taking 20mg  Lasix because of history of LV dysfunction, followed by cardiologist   Joint pains: She states she takes Naprosyn as needed, does not have any significant joint pains  Diabetic foot exam was normal in 2/16   Examination:   BP 136/84 mmHg  Pulse 76  Temp(Src) 98 F (36.7 C)  Resp 16  Ht 5\' 3"  (1.6 m)  Wt 174 lb 12.8 oz (79.289 kg)  BMI 30.97 kg/m2  SpO2 95%  Body mass index is 30.97 kg/(m^2).    No pedal edema  Assesment:   Diabetes type 2, uncontrolled:   See history of present illness for detailed discussion of his current management, blood sugar patterns and problems identified Currently in a regimen of basal bolus insulin, Victoza and Invokamet  Her A1c is gradually improving although still over 8%. Some of this improvement may be from using Toujeo. Fasting blood sugars are near normal, checked infrequently She has some post prandial hyperglycemia especially in the evenings. This is likely related to her high carbohydrate diet with rice. Also not exercising as much as discussed above. Since his difficult to adjust her insulin with inconsistent readings have asked her to be more regular with checking sugars about 2 hours after  eating She will also be given a new glucose monitor where she can mark readings after meals Encouraged her to use the exercise bike daily even for a few minutes. She will need to cut back on her carbohydrate intake with portions of rice.   HYPERTENSION: Fairly well controlled, to follow-up with PCP  LIPIDS: Well controlled   Patient Instructions  Bike daily  Check blood sugars on waking up 3  times a week Also check blood sugars about 2 hours after a meal and do this after different meals by rotation  Recommended blood sugar levels on waking up is 90-130 and about 2 hours after meal is 130-180  Please bring your blood sugar monitor to each visit, thank you  Cut rice portions in 1/2, small portions of sweets  Mark after meal sugars   Counseling time on subjects discussed above is over 50% of today's 25 minute visit   Van Seymore 11/21/2015, 3:21 PM

## 2015-12-01 ENCOUNTER — Other Ambulatory Visit: Payer: Self-pay | Admitting: *Deleted

## 2015-12-01 MED ORDER — INSULIN GLARGINE 300 UNIT/ML ~~LOC~~ SOPN: 40.0000 [IU] | PEN_INJECTOR | Freq: Every day | SUBCUTANEOUS | Status: DC

## 2015-12-10 DIAGNOSIS — Z95810 Presence of automatic (implantable) cardiac defibrillator: Secondary | ICD-10-CM | POA: Diagnosis not present

## 2015-12-28 ENCOUNTER — Other Ambulatory Visit: Payer: Self-pay | Admitting: *Deleted

## 2015-12-28 MED ORDER — ATORVASTATIN CALCIUM 40 MG PO TABS: ORAL_TABLET | ORAL | Status: DC

## 2016-01-03 DIAGNOSIS — F329 Major depressive disorder, single episode, unspecified: Secondary | ICD-10-CM | POA: Diagnosis not present

## 2016-01-03 DIAGNOSIS — I119 Hypertensive heart disease without heart failure: Secondary | ICD-10-CM | POA: Diagnosis not present

## 2016-01-03 DIAGNOSIS — E038 Other specified hypothyroidism: Secondary | ICD-10-CM | POA: Diagnosis not present

## 2016-01-03 DIAGNOSIS — Z23 Encounter for immunization: Secondary | ICD-10-CM | POA: Diagnosis not present

## 2016-01-08 ENCOUNTER — Other Ambulatory Visit: Payer: Self-pay | Admitting: *Deleted

## 2016-01-08 MED ORDER — CANAGLIFLOZIN-METFORMIN HCL 150-1000 MG PO TABS: ORAL_TABLET | ORAL | Status: DC

## 2016-02-09 ENCOUNTER — Other Ambulatory Visit: Payer: Self-pay

## 2016-02-09 DIAGNOSIS — Z1231 Encounter for screening mammogram for malignant neoplasm of breast: Secondary | ICD-10-CM

## 2016-02-15 ENCOUNTER — Other Ambulatory Visit: Payer: Self-pay | Admitting: *Deleted

## 2016-02-15 MED ORDER — POTASSIUM CHLORIDE CRYS ER 20 MEQ PO TBCR: 20.0000 meq | EXTENDED_RELEASE_TABLET | Freq: Every day | ORAL | Status: DC

## 2016-02-20 ENCOUNTER — Other Ambulatory Visit (INDEPENDENT_AMBULATORY_CARE_PROVIDER_SITE_OTHER): Payer: Federal, State, Local not specified - PPO

## 2016-02-20 DIAGNOSIS — Z794 Long term (current) use of insulin: Secondary | ICD-10-CM | POA: Diagnosis not present

## 2016-02-20 DIAGNOSIS — E1165 Type 2 diabetes mellitus with hyperglycemia: Secondary | ICD-10-CM | POA: Diagnosis not present

## 2016-02-20 LAB — BASIC METABOLIC PANEL
BUN: 17 mg/dL (ref 6–23)
CO2: 28 mEq/L (ref 19–32)
Calcium: 9.3 mg/dL (ref 8.4–10.5)
Chloride: 104 mEq/L (ref 96–112)
Creatinine, Ser: 0.7 mg/dL (ref 0.40–1.20)
GFR: 89.64 mL/min (ref >60.00)
Glucose, Bld: 124 mg/dL — ABNORMAL HIGH (ref 70–99)
Potassium: 3.6 mEq/L (ref 3.5–5.1)
Sodium: 139 mEq/L (ref 135–145)

## 2016-02-20 LAB — HEMOGLOBIN A1C: Hgb A1c MFr Bld: 8.5 % — ABNORMAL HIGH (ref 4.6–6.5)

## 2016-02-23 ENCOUNTER — Encounter: Payer: Self-pay | Admitting: Endocrinology

## 2016-02-23 ENCOUNTER — Ambulatory Visit (INDEPENDENT_AMBULATORY_CARE_PROVIDER_SITE_OTHER): Payer: Federal, State, Local not specified - PPO | Admitting: Endocrinology

## 2016-02-23 VITALS — BP 148/76 | HR 63 | Ht 63.0 in | Wt 175.0 lb

## 2016-02-23 DIAGNOSIS — I10 Essential (primary) hypertension: Secondary | ICD-10-CM

## 2016-02-23 DIAGNOSIS — Z794 Long term (current) use of insulin: Secondary | ICD-10-CM | POA: Diagnosis not present

## 2016-02-23 DIAGNOSIS — E1165 Type 2 diabetes mellitus with hyperglycemia: Secondary | ICD-10-CM | POA: Diagnosis not present

## 2016-02-23 NOTE — Progress Notes (Signed)
Patient ID: Linda Lowe, female   DOB: 14-Jan-1951, 65 y.o.   MRN: [id number]   Reason for Appointment: Diabetes follow-up   History of Present Illness   Diagnosis: date of diagnosis: ? 2009  RECENT history:  Current insulin regimen:      TOUJEO 40 UNITS IN AM ONCE DAILY  Novolog 16 at lunch and 18 at dinner Non-insulin hypoglycemic drugs: Victoza 1.8 mg daily at lunch.  Invokamet 150/1000 twice a day  A1c is now higher at 8.5, previously had improved to 8.1 The date and time on her glucose monitor was incorrect and it was difficult to identify the proper readings  Current blood sugar patterns and problems identified:  She has checked her sugar at various times but now mostly fasting and some after lunch/first meal of the day  Previously her blood sugars were high mostly in the evenings after supper but inconsistent and mostly related to increased carbohydrate intake such as large portions rice  She has been asked to improve her diet but blood sugars are not improved  She is getting fairly good readings in the mornings fasting usually, has a couple of high readings also  Blood sugars after lunch are low normal and occasionally in the 60s, however still has occasional readings over 180  She has only 2 readings after supper and these are significantly high, highest 346  Still has not lost any weight although she thinks she is trying to exercise  Compliance with her diabetes medication regimen is fairly good  Hypoglycemia: Mild with readings of 62 and 64 within the last week at about 2 PM   Glucometer: Contour.  Blood Glucose readings are being checked mostly after lunch and dinner or at bedtime  Mean values apply above for all meters except median for One Touch  PRE-MEAL Fasting Lunch Dinner Bedtime Overall  Glucose range: 83-164       Mean/median: 140     134    POST-MEAL PC Breakfast PC Lunch PC Dinner  Glucose range:  129  234, 346   Mean/median:  62-241      DIET: Usually small portions  Meals: 3 meals per day usually at 11 a.m. and 7-8 p.m.  Physical activity: exercise: bike 2/7  Certified Diabetes Educator visit: Most recent:, 12/12.  Dietician visit: Most recent: 10/15     Wt Readings from Last 3 Encounters:  02/23/16 175 lb (79.379 kg)  11/21/15 174 lb 12.8 oz (79.289 kg)  08/18/15 174 lb 3.2 oz (79.017 kg)   LABS:  Lab Results  Component Value Date   HGBA1C 8.5* 02/20/2016   HGBA1C 8.1* 11/13/2015   HGBA1C 8.6* 08/15/2015   Lab Results  Component Value Date   MICROALBUR 9.3* 11/13/2015   LDLCALC 83 11/13/2015   CREATININE 0.70 02/20/2016     PAST history: Her diabetes has been usually poorly controlled. She has been on multiple medications including Victoza and basal bolus regimen also. However she  had difficulty with consistent compliance with insulin, diet and exercise regimen. With starting Victoza her blood sugars appeared to be improved. After repeated sessions with educators she was able to improve her compliance level and blood sugars had been improving since about 10/13. Insulin doses have been increased overall  A1c previously had been usually over 8% and below this for the first time in 2/14. She had been switched to a combination of Invokana and metformin instead of Janumet in late 2014 In 1/15 her blood sugars are excellent  with A1c 7% In 2015 her blood sugars had been gradually more difficult to control with rising A1c and persistently over 8%     Lab on 02/20/2016  Component Date Value Ref Range Status  . Hgb A1c MFr Bld 02/20/2016 8.5* 4.6 - 6.5 % Final   Glycemic Control Guidelines for People with Diabetes:Non Diabetic:  <6%Goal of Therapy: <7%Additional Action Suggested:  >8%   . Sodium 02/20/2016 139  135 - 145 mEq/L Final  . Potassium 02/20/2016 3.6  3.5 - 5.1 mEq/L Final  . Chloride 02/20/2016 104  96 - 112 mEq/L Final  . CO2 02/20/2016 28  19 - 32 mEq/L Final  . Glucose, Bld 02/20/2016 124*  70 - 99 mg/dL Final  . BUN 01/56/1537 17  6 - 23 mg/dL Final  . Creatinine, Ser 02/20/2016 0.70  0.40 - 1.20 mg/dL Final  . Calcium 94/32/7614 9.3  8.4 - 10.5 mg/dL Final  . GFR 70/92/9574 89.64  >60.00 mL/min Final      Medication List       This list is accurate as of: 02/23/16 11:59 PM.  Always use your most recent med list.               atorvastatin 40 MG tablet  Commonly known as:  LIPITOR  TAKE 1 TABLET (40 MG TOTAL) BY MOUTH DAILY.     BAYER CONTOUR NEXT TEST test strip  Generic drug:  glucose blood  TEST BLOOD SUGAR TWICE DAILY AS DIRECTED     BAYER MICROLET LANCETS lancets  Use as instructed to check blood sugar 2 times per day dx code E11.65     buPROPion 300 MG 24 hr tablet  Commonly known as:  WELLBUTRIN XL  Take 300 mg by mouth daily.     Canagliflozin-Metformin HCl [phone] MG Tabs  Commonly known as:  INVOKAMET  TAKE 1 TABLET 2 TIMES DAILY     carvedilol 25 MG tablet  Commonly known as:  COREG  Take 25 mg by mouth 2 (two) times daily.     EPIPEN 0.3 mg/0.3 mL Devi  Generic drug:  EPINEPHrine  Inject 0.3 mg into the muscle as needed. For allergic reactions     furosemide 20 MG tablet  Commonly known as:  LASIX  Take 20 mg by mouth daily.     Insulin Glargine 300 UNIT/ML Sopn  Commonly known as:  TOUJEO SOLOSTAR  Inject 40 Units into the skin daily.     Insulin Pen Needle 31G X 5 MM Misc  Commonly known as:  B-D UF III MINI PEN NEEDLES  USE 5 PEN NEEDLES PER DAY AS INSTRUCTED     irbesartan 300 MG tablet  Commonly known as:  AVAPRO  Take 300 mg by mouth daily.     naproxen 500 MG tablet  Commonly known as:  NAPROSYN  Take 500 mg by mouth 2 (two) times daily with a meal.     NOVOLOG FLEXPEN 100 UNIT/ML FlexPen  Generic drug:  insulin aspart  INJECT 16 UNITS INTO THE SKIN 2 (TWO) TIMES DAILY BEFORE LUNCH AND SUPPER.     oxybutynin 5 MG 24 hr tablet  Commonly known as:  DITROPAN-XL  Take 5 mg by mouth daily.     potassium chloride SA 20  MEQ tablet  Commonly known as:  K-DUR,KLOR-CON  Take 1 tablet (20 mEq total) by mouth daily.     VICTOZA 18 MG/3ML Sopn  Generic drug:  Liraglutide  INJECT 0.3 MLS SUBCUTANEOUSLY DAILY  Allergies: No Known Allergies  Past Medical History  Diagnosis Date  . Diabetes mellitus     a. Dx > 5 y ago  . Hypertension   . Hypercholesteremia   . Arthritis   . Cardiomyopathy (HCC)     a. 05/2012 Echo: EF 30-35%, Gr 1 DD, mild LVH, mild MR, pericardial effusion  . Pericardial effusion     a. 05/2012 Echo: circumferential pericardial effusion, mostly 49mm, largest pocket posterior - 68mm, mild RA indentation, no RV collapse, no tamponade.    Past Surgical History  Procedure Laterality Date  . Abdominal hysterectomy    . Foot surgery    . Left and right heart catheterization with coronary angiogram  05/26/2012    Procedure: LEFT AND RIGHT HEART CATHETERIZATION WITH CORONARY ANGIOGRAM;  Surgeon: Tonny Bollman, MD;  Location: [facility] CATH LAB;  Service: Cardiovascular;;  . Bi-ventricular implantable cardioverter defibrillator N/A 12/01/2013    Procedure: BI-VENTRICULAR IMPLANTABLE CARDIOVERTER DEFIBRILLATOR  (CRT-D);  Surgeon: Duke Salvia, MD;  Location: [facility] CATH LAB;  Service: Cardiovascular;  Laterality: N/A;    Family History  Problem Relation Age of Onset  . Other      No history of CAD  . Tuberculosis Father     died when pt was young.    Social History:  reports that she has quit smoking. She does not have any smokeless tobacco history on file. She reports that she does not drink alcohol or use illicit drugs.  Review of Systems   Lipids: LDL controlled, taking Lipitor 40 mg   Lab Results  Component Value Date   CHOL 150 11/13/2015   HDL 45.90 11/13/2015   LDLCALC 83 11/13/2015   LDLDIRECT 141.0 06/09/2013   TRIG 104.0 11/13/2015   CHOLHDL 3 11/13/2015    Hypertension: Has been on multiple medications, she follows with PCP and cardiologist, Blood pressure high  normal  Taking 20mg  Lasix because of history of LV dysfunction, followed by cardiologist    Diabetic foot exam was normal in 2/16   Examination:   BP 148/76 mmHg  Pulse 63  Ht 5\' 3"  (1.6 m)  Wt 175 lb (79.379 kg)  BMI 31.01 kg/m2  Body mass index is 31.01 kg/(m^2).   Diabetic Foot Exam - Simple   Simple Foot Form  Diabetic Foot exam was performed with the following findings:  Yes 02/23/2016 10:01 AM  Visual Inspection  No deformities, no ulcerations, no other skin breakdown bilaterally:  Yes  Sensation Testing  Intact to touch and monofilament testing bilaterally:  Yes  Pulse Check  Posterior Tibialis and Dorsalis pulse intact bilaterally:  Yes  Comments       Assesment:   Diabetes type 2, uncontrolled:   See history of present illness for detailed discussion of his current management, blood sugar patterns and problems identified Currently in a regimen of basal bolus insulin, Victoza and Invokamet  Her A1c is increased compared to her previous levels at 8.5, higher than expected for her home readings Even though her blood sugars are averaging 135 recently she probably is checking readings mostly fasting and after lunch when they are relatively better She has a couple of significant high readings after supper and likely has high readings in the evenings Occasionally has high readings at other times also She will need to cut back on her carbohydrate intake with portions of rice especially at supper  Insulin doses: She will reduce her lunchtime NovoLog to 14 and increase to 22 at suppertime More readings after  supper instead of fasting and after lunch She will call if she has any hypoglycemia again No change in Toujeo  HYPERTENSION: Systolic high normal to follow-up with PCP    Patient Instructions  Lunch Novolog 14 units, 16 for larger meal or rice  Novolog 22 at dinner  Check blood sugars on waking up  3 times a week Also check blood sugars about 2 hours after a  meal and do this after different meals by rotation  Recommended blood sugar levels on waking up is 90-130 and about 2 hours after meal is 130-160  Please bring your blood sugar monitor to each visit, thank you   Counseling time on subjects discussed above is over 50% of today's 25 minute visit   Shelene Krage 02/25/2016, 3:32 PM

## 2016-02-23 NOTE — Patient Instructions (Signed)
Lunch Novolog 14 units, 16 for larger meal or rice  Novolog 22 at dinner  Check blood sugars on waking up  3 times a week Also check blood sugars about 2 hours after a meal and do this after different meals by rotation  Recommended blood sugar levels on waking up is 90-130 and about 2 hours after meal is 130-160  Please bring your blood sugar monitor to each visit, thank you

## 2016-02-28 ENCOUNTER — Ambulatory Visit
Admission: RE | Admit: 2016-02-28 | Discharge: 2016-02-28 | Disposition: A | Discharge status: Home / Self Care | Payer: Federal, State, Local not specified - PPO | Source: Ambulatory Visit

## 2016-02-28 ENCOUNTER — Other Ambulatory Visit: Payer: Self-pay | Admitting: Family Medicine

## 2016-02-28 DIAGNOSIS — Z1231 Encounter for screening mammogram for malignant neoplasm of breast: Secondary | ICD-10-CM

## 2016-03-19 ENCOUNTER — Other Ambulatory Visit: Payer: Self-pay | Admitting: Endocrinology

## 2016-04-01 ENCOUNTER — Other Ambulatory Visit: Payer: Self-pay

## 2016-04-01 MED ORDER — LIRAGLUTIDE 18 MG/3ML ~~LOC~~ SOPN: PEN_INJECTOR | SUBCUTANEOUS | Status: DC

## 2016-04-11 ENCOUNTER — Other Ambulatory Visit: Payer: Self-pay | Admitting: Endocrinology

## 2016-04-20 ENCOUNTER — Other Ambulatory Visit: Payer: Self-pay | Admitting: Endocrinology

## 2016-05-13 ENCOUNTER — Other Ambulatory Visit: Payer: Self-pay

## 2016-05-13 MED ORDER — INSULIN ASPART 100 UNIT/ML FLEXPEN: PEN_INJECTOR | SUBCUTANEOUS | 3 refills | Status: DC

## 2016-05-20 ENCOUNTER — Other Ambulatory Visit: Payer: Self-pay | Admitting: Endocrinology

## 2016-05-20 MED ORDER — LIRAGLUTIDE 18 MG/3ML ~~LOC~~ SOPN: PEN_INJECTOR | SUBCUTANEOUS | 1 refills | Status: DC

## 2016-05-21 ENCOUNTER — Other Ambulatory Visit: Payer: Federal, State, Local not specified - PPO

## 2016-05-22 ENCOUNTER — Other Ambulatory Visit: Payer: Federal, State, Local not specified - PPO

## 2016-05-24 ENCOUNTER — Ambulatory Visit: Payer: Federal, State, Local not specified - PPO | Admitting: Endocrinology

## 2016-06-05 ENCOUNTER — Other Ambulatory Visit (INDEPENDENT_AMBULATORY_CARE_PROVIDER_SITE_OTHER): Payer: Federal, State, Local not specified - PPO

## 2016-06-05 DIAGNOSIS — Z794 Long term (current) use of insulin: Secondary | ICD-10-CM | POA: Diagnosis not present

## 2016-06-05 DIAGNOSIS — E1165 Type 2 diabetes mellitus with hyperglycemia: Secondary | ICD-10-CM | POA: Diagnosis not present

## 2016-06-05 LAB — BASIC METABOLIC PANEL
BUN: 20 mg/dL (ref 6–23)
CO2: 29 mEq/L (ref 19–32)
Calcium: 9.4 mg/dL (ref 8.4–10.5)
Chloride: 102 mEq/L (ref 96–112)
Creatinine, Ser: 0.76 mg/dL (ref 0.40–1.20)
GFR: 81.44 mL/min (ref >60.00)
Glucose, Bld: 153 mg/dL — ABNORMAL HIGH (ref 70–99)
Potassium: 4.2 mEq/L (ref 3.5–5.1)
Sodium: 139 mEq/L (ref 135–145)

## 2016-06-06 LAB — FRUCTOSAMINE: Fructosamine: 345 umol/L — ABNORMAL HIGH (ref 0–285)

## 2016-06-07 ENCOUNTER — Encounter (HOSPITAL_COMMUNITY): Payer: Self-pay | Admitting: Emergency Medicine

## 2016-06-07 ENCOUNTER — Other Ambulatory Visit: Payer: Self-pay | Admitting: Endocrinology

## 2016-06-07 ENCOUNTER — Ambulatory Visit (HOSPITAL_COMMUNITY)
Admission: EM | Admit: 2016-06-07 | Discharge: 2016-06-07 | Disposition: A | Discharge status: Home / Self Care | Payer: Federal, State, Local not specified - PPO | Attending: Internal Medicine | Admitting: Internal Medicine

## 2016-06-07 DIAGNOSIS — S61219A Laceration without foreign body of unspecified finger without damage to nail, initial encounter: Secondary | ICD-10-CM

## 2016-06-07 MED ORDER — LIDOCAINE HCL 2 % IJ SOLN
INTRAMUSCULAR | Status: AC
  Filled 2016-06-07: qty 20

## 2016-06-07 NOTE — ED Triage Notes (Signed)
The patient presented to the [facility] with a complaint of a laceration to her left hand middle finger that occurred today. The patient stated that she lacerated her finger with a butcher knife.   The patient presented with her finger wrapped in gauze.  The patient's right middle finger was placed in a CHG soak.

## 2016-06-07 NOTE — ED Provider Notes (Signed)
CSN: [id number]     Arrival date & time 06/07/16  1241 History   First MD Initiated Contact with Patient 06/07/16 1437     Chief Complaint  Patient presents with  . Laceration   (Consider location/radiation/quality/duration/timing/severity/associated sxs/prior Treatment) HPI 65 year old female laceration to her left long finger she was cutting squash at home and caught her finger with a butcher knife. She states her immunizations are up-to-date. She was unable to control the bleeding at home she has no pain at this time. Past Medical History:  Diagnosis Date  . Arthritis   . Cardiomyopathy (HCC)    a. 05/2012 Echo: EF 30-35%, Gr 1 DD, mild LVH, mild MR, pericardial effusion  . Diabetes mellitus    a. Dx > 5 y ago  . Hypercholesteremia   . Hypertension   . Pericardial effusion    a. 05/2012 Echo: circumferential pericardial effusion, mostly 92mm, largest pocket posterior - 76mm, mild RA indentation, no RV collapse, no tamponade.   Past Surgical History:  Procedure Laterality Date  . ABDOMINAL HYSTERECTOMY    . BI-VENTRICULAR IMPLANTABLE CARDIOVERTER DEFIBRILLATOR N/A 12/01/2013   Procedure: BI-VENTRICULAR IMPLANTABLE CARDIOVERTER DEFIBRILLATOR  (CRT-D);  Surgeon: Duke Salvia, MD;  Location: [facility] CATH LAB;  Service: Cardiovascular;  Laterality: N/A;  . FOOT SURGERY    . LEFT AND RIGHT HEART CATHETERIZATION WITH CORONARY ANGIOGRAM  05/26/2012   Procedure: LEFT AND RIGHT HEART CATHETERIZATION WITH CORONARY ANGIOGRAM;  Surgeon: Tonny Bollman, MD;  Location: [facility] CATH LAB;  Service: Cardiovascular;;   Family History  Problem Relation Age of Onset  . Other      No history of CAD  . Tuberculosis Father     died when pt was young.   Social History  Substance Use Topics  . Smoking status: Former Games developer  . Smokeless tobacco: Never Used     Comment: Quit "long time ago."  . Alcohol use No   OB History    No data available     Review of Systems   Denies: HEADACHE, NAUSEA, ABDOMINAL  PAIN, CHEST PAIN, CONGESTION, DYSURIA, SHORTNESS OF BREATH  Allergies  Review of patient's allergies indicates no known allergies.  Home Medications   Prior to Admission medications   Medication Sig Start Date End Date Taking? Authorizing Provider  atorvastatin (LIPITOR) 40 MG tablet TAKE 1 TABLET (40 MG TOTAL) BY MOUTH DAILY. 12/28/15   Reather Littler, MD  BAYER CONTOUR NEXT TEST test strip USE AS DIRECTED TWICE DAILY 04/22/16   Reather Littler, MD  buPROPion (WELLBUTRIN XL) 300 MG 24 hr tablet Take 300 mg by mouth daily.    Historical Provider, MD  carvedilol (COREG) 25 MG tablet Take 25 mg by mouth 2 (two) times daily. 07/15/15   Historical Provider, MD  EPINEPHrine (EPIPEN) 0.3 mg/0.3 mL DEVI Inject 0.3 mg into the muscle as needed. For allergic reactions    Historical Provider, MD  furosemide (LASIX) 20 MG tablet Take 20 mg by mouth daily.    Historical Provider, MD  insulin aspart (NOVOLOG FLEXPEN) 100 UNIT/ML FlexPen INJECT 16 UNITS INTO THE SKIN 2 (TWO) TIMES DAILY BEFORE LUNCH AND SUPPER. 05/13/16   Reather Littler, MD  Insulin Pen Needle (B-D UF III MINI PEN NEEDLES) 31G X 5 MM MISC USE 5 PEN NEEDLES PER DAY AS INSTRUCTED 10/20/15   Reather Littler, MD  INVOKAMET [phone] MG TABS TAKE 1 TABLET BY MOUTH TWICE DAILY 04/11/16   Reather Littler, MD  irbesartan (AVAPRO) 300 MG tablet Take 300 mg by mouth  daily.    Historical Provider, MD  Liraglutide (VICTOZA) 18 MG/3ML SOPN INJECT 1.8mg  SUBCUTANEOUSLY DAILY 05/20/16   Reather Littler, MD  MICROLET LANCETS MISC USE AS DIRECTED TO CHECK BLOOD SUGAR TWICE DAILY 03/19/16   Reather Littler, MD  naproxen (NAPROSYN) 500 MG tablet Take 500 mg by mouth 2 (two) times daily with a meal.    Historical Provider, MD  oxybutynin (DITROPAN-XL) 5 MG 24 hr tablet Take 5 mg by mouth daily.    Historical Provider, MD  potassium chloride SA (K-DUR,KLOR-CON) 20 MEQ tablet TAKE 1 TABLET(20 MEQ) BY MOUTH DAILY 06/07/16   Reather Littler, MD  TOUJEO SOLOSTAR 300 UNIT/ML SOPN INJECT 40 UNITS INTO THE SKIN DAILY  04/11/16   Reather Littler, MD   Meds Ordered and Administered this Visit  Medications - No data to display  BP 149/63 (BP Location: Left Arm)   Pulse 68   Temp 98.8 F (37.1 C) (Oral)   Resp 12   SpO2 97%  No data found.   Physical Exam NURSES NOTES AND VITAL SIGNS REVIEWED. CONSTITUTIONAL: Well developed, well nourished, no acute distress HEENT: normocephalic, atraumatic EYES: Conjunctiva normal NECK:normal ROM, supple, no adenopathy PULMONARY:No respiratory distress, normal effort ABDOMINAL: Soft, ND, NT BS+, No CVAT MUSCULOSKELETAL: Normal ROM of all extremities, left long finger there is a less than 1 cm wound noted just adjacent to the fingernail on the lateral aspect. No active bleeding. Sensorimotor function intact distally.  SKIN: warm and dry without rash PSYCHIATRIC: Mood and affect, behavior are normal  Urgent Care Course   Clinical Course    .Marland KitchenLaceration Repair Date/Time: 06/07/2016 8:51 PM Performed by: Tharon Aquas Authorized by: Eustace Moore   Consent:    Consent obtained:  Verbal Anesthesia (see MAR for exact dosages):    Anesthesia method:  Local infiltration   Local anesthetic:  Lidocaine 2% w/o epi Laceration details:    Location:  Finger   Finger location:  L long finger   Length (cm):  1 Repair type:    Repair type:  Simple Pre-procedure details:    Preparation:  Patient was prepped and draped in usual sterile fashion Exploration:    Contaminated: no   Treatment:    Area cleansed with:  Betadine   Amount of cleaning:  Standard   Irrigation solution:  Sterile saline Skin repair:    Repair method:  Sutures   Suture size:  5-0   Suture material:  Prolene   Suture technique:  Simple interrupted   Number of sutures:  3 Approximation:    Approximation:  Close   Vermilion border: well-aligned   Post-procedure details:    Dressing:  Antibiotic ointment and tube gauze   Patient tolerance of procedure:  Tolerated well, no immediate  complications   (including critical care time)  Labs Review Labs Reviewed - No data to display  Imaging Review No results found.   Visual Acuity Review  Right Eye Distance:   Left Eye Distance:   Bilateral Distance:    Right Eye Near:   Left Eye Near:    Bilateral Near:         MDM   1. Laceration of finger, initial encounter     Patient is reassured that there are no issues that require transfer to higher level of care at this time or additional tests. Patient is advised to continue home symptomatic treatment. Patient is advised that if there are new or worsening symptoms to attend the emergency department, contact primary care  provider, or return to UC. Instructions of care provided discharged home in stable condition.    THIS NOTE WAS GENERATED USING A VOICE RECOGNITION SOFTWARE PROGRAM. ALL REASONABLE EFFORTS  WERE MADE TO PROOFREAD THIS DOCUMENT FOR ACCURACY.  I have verbally reviewed the discharge instructions with the patient. A printed AVS was given to the patient.  All questions were answered prior to discharge.     Tharon Aquas, PA 06/07/16 2052

## 2016-06-10 ENCOUNTER — Ambulatory Visit (INDEPENDENT_AMBULATORY_CARE_PROVIDER_SITE_OTHER): Payer: Federal, State, Local not specified - PPO | Admitting: Endocrinology

## 2016-06-10 VITALS — BP 126/76 | HR 76 | Resp 16 | Ht 63.0 in | Wt 173.4 lb

## 2016-06-10 DIAGNOSIS — I10 Essential (primary) hypertension: Secondary | ICD-10-CM | POA: Diagnosis not present

## 2016-06-10 DIAGNOSIS — E1165 Type 2 diabetes mellitus with hyperglycemia: Secondary | ICD-10-CM

## 2016-06-10 DIAGNOSIS — Z794 Long term (current) use of insulin: Secondary | ICD-10-CM

## 2016-06-10 NOTE — Progress Notes (Signed)
Patient ID: Linda Lowe, female   DOB: 1950-12-04, 65 y.o.   MRN: [id number]   Reason for Appointment: Diabetes follow-up   History of Present Illness   Diagnosis: date of diagnosis: ? 2009   Current insulin regimen:      TOUJEO 40 UNITS IN AM ONCE DAILY  Novolog 16 at lunch and 22 at dinner  Non-insulin hypoglycemic drugs: Victoza 1.8 mg daily at lunch.  Invokamet 150/1000 twice a day  A1c on the last visit was still over 8%, reading 8.5 She did not come back for her short-term visit as scheduled and FRUCTOSAMINE is now higher at 345  Current blood sugar patterns and problems identified:   She did not bring her monitor  She does not remember her readings well  Although she has increased her suppertime coverage up to 22 units she was not sure of this dose  She still thinks she has high readings after supper  Her diet is inconsistent and difficult to assess, she thinks she may eat some kind of fruits that may raise her sugar  Weight is difficult to control.    She also forgets to check her sugars fasting to help adjust her long-term insulin  No overnight hypoglycemia  Hypoglycemia: in 60s occasionally, not usually symptomatic    Glucometer: Contour.  Blood Glucose readings are being checked mostly after lunch and dinner or at bedtime  Readings by recall:   PRE-MEAL Fasting Lunch Dinner Bedtime Overall  Glucose range: 85   100-300   Mean/median:        DIET: Usually small portions, at times has more carbohydrate  Meals: 3 meals per day usually at 11 a.m. and 7-8 p.m.  Physical activity: exercise: walking, 20 min, 3-4 days a week;    Certified Diabetes Educator visit: Most recent:, 2015.  Dietician visit: Most recent: 10/15     Wt Readings from Last 3 Encounters:  06/10/16 173 lb 6.4 oz (78.7 kg)  02/23/16 175 lb (79.4 kg)  11/21/15 174 lb 12.8 oz (79.3 kg)   LABS:  Lab Results  Component Value Date   HGBA1C 8.5 (H) 02/20/2016   HGBA1C 8.1  (H) 11/13/2015   HGBA1C 8.6 (H) 08/15/2015   Lab Results  Component Value Date   MICROALBUR 9.3 (H) 11/13/2015   LDLCALC 83 11/13/2015   CREATININE 0.76 06/05/2016     PAST history: Her diabetes has been usually poorly controlled. She has been on multiple medications including Victoza and basal bolus regimen also. However she  had difficulty with consistent compliance with insulin, diet and exercise regimen. With starting Victoza her blood sugars appeared to be improved. After repeated sessions with educators she was able to improve her compliance level and blood sugars had been improving since about 10/13. Insulin doses have been increased overall  A1c previously had been usually over 8% and below this for the first time in 2/14. She had been switched to a combination of Invokana and metformin instead of Janumet in late 2014 In 1/15 her blood sugars are excellent with A1c 7%      Lab on 06/05/2016  Component Date Value Ref Range Status  . Fructosamine 06/06/2016 345* 0 - 285 umol/L Final   Comment: Published reference interval for apparently healthy subjects between age 80 and 76 is 20 - 285 umol/L and in a poorly controlled diabetic population is 228 - 563 umol/L with a mean of 396 umol/L.   Marland Kitchen Sodium 06/05/2016 139  135 - 145  mEq/L Final  . Potassium 06/05/2016 4.2  3.5 - 5.1 mEq/L Final  . Chloride 06/05/2016 102  96 - 112 mEq/L Final  . CO2 06/05/2016 29  19 - 32 mEq/L Final  . Glucose, Bld 06/05/2016 153* 70 - 99 mg/dL Final  . BUN 56/86/1683 20  6 - 23 mg/dL Final  . Creatinine, Ser 06/05/2016 0.76  0.40 - 1.20 mg/dL Final  . Calcium 72/90/2111 9.4  8.4 - 10.5 mg/dL Final  . GFR 55/20/8022 81.44  >60.00 mL/min Final      Medication List       Accurate as of 06/10/16 11:59 PM. Always use your most recent med list.          atorvastatin 40 MG tablet Commonly known as:  LIPITOR TAKE 1 TABLET (40 MG TOTAL) BY MOUTH DAILY.   BAYER CONTOUR NEXT TEST test  strip Generic drug:  glucose blood USE AS DIRECTED TWICE DAILY   buPROPion 300 MG 24 hr tablet Commonly known as:  WELLBUTRIN XL Take 300 mg by mouth daily.   carvedilol 25 MG tablet Commonly known as:  COREG Take 25 mg by mouth 2 (two) times daily.   EPIPEN 0.3 mg/0.3 mL Devi Generic drug:  EPINEPHrine Inject 0.3 mg into the muscle as needed. For allergic reactions   FLUCELVAX QUADRIVALENT 0.5 ML Susy Generic drug:  Influenza Vac Subunit Quad ADM 0.5ML IM UTD   furosemide 20 MG tablet Commonly known as:  LASIX Take 20 mg by mouth daily.   insulin aspart 100 UNIT/ML FlexPen Commonly known as:  NOVOLOG FLEXPEN INJECT 16 UNITS INTO THE SKIN 2 (TWO) TIMES DAILY BEFORE LUNCH AND SUPPER.   Insulin Pen Needle 31G X 5 MM Misc Commonly known as:  B-D UF III MINI PEN NEEDLES USE 5 PEN NEEDLES PER DAY AS INSTRUCTED   INVOKAMET [phone] MG Tabs Generic drug:  Canagliflozin-Metformin HCl TAKE 1 TABLET BY MOUTH TWICE DAILY   irbesartan 300 MG tablet Commonly known as:  AVAPRO Take 300 mg by mouth daily.   Liraglutide 18 MG/3ML Sopn Commonly known as:  VICTOZA INJECT 1.8mg  SUBCUTANEOUSLY DAILY   MICROLET LANCETS Misc USE AS DIRECTED TO CHECK BLOOD SUGAR TWICE DAILY   naproxen 500 MG tablet Commonly known as:  NAPROSYN Take 500 mg by mouth 2 (two) times daily with a meal.   oxybutynin 5 MG 24 hr tablet Commonly known as:  DITROPAN-XL Take 5 mg by mouth daily.   potassium chloride SA 20 MEQ tablet Commonly known as:  K-DUR,KLOR-CON TAKE 1 TABLET(20 MEQ) BY MOUTH DAILY   TOUJEO SOLOSTAR 300 UNIT/ML Sopn Generic drug:  Insulin Glargine INJECT 40 UNITS INTO THE SKIN DAILY       Allergies: No Known Allergies  Past Medical History:  Diagnosis Date  . Arthritis   . Cardiomyopathy (HCC)    a. 05/2012 Echo: EF 30-35%, Gr 1 DD, mild LVH, mild MR, pericardial effusion  . Diabetes mellitus    a. Dx > 5 y ago  . Hypercholesteremia   . Hypertension   . Pericardial  effusion    a. 05/2012 Echo: circumferential pericardial effusion, mostly 63mm, largest pocket posterior - 3mm, mild RA indentation, no RV collapse, no tamponade.    Past Surgical History:  Procedure Laterality Date  . ABDOMINAL HYSTERECTOMY    . BI-VENTRICULAR IMPLANTABLE CARDIOVERTER DEFIBRILLATOR N/A 12/01/2013   Procedure: BI-VENTRICULAR IMPLANTABLE CARDIOVERTER DEFIBRILLATOR  (CRT-D);  Surgeon: Duke Salvia, MD;  Location: [facility] CATH LAB;  Service: Cardiovascular;  Laterality: N/A;  .  FOOT SURGERY    . LEFT AND RIGHT HEART CATHETERIZATION WITH CORONARY ANGIOGRAM  05/26/2012   Procedure: LEFT AND RIGHT HEART CATHETERIZATION WITH CORONARY ANGIOGRAM;  Surgeon: Tonny Bollman, MD;  Location: [facility] CATH LAB;  Service: Cardiovascular;;    Family History  Problem Relation Age of Onset  . Other      No history of CAD  . Tuberculosis Father     died when pt was young.    Social History:  reports that she has quit smoking. She has never used smokeless tobacco. She reports that she does not drink alcohol or use drugs.  Review of Systems   Lipids: LDL controlled, taking Lipitor 40 mg   Lab Results  Component Value Date   CHOL 150 11/13/2015   HDL 45.90 11/13/2015   LDLCALC 83 11/13/2015   LDLDIRECT 141.0 06/09/2013   TRIG 104.0 11/13/2015   CHOLHDL 3 11/13/2015    Hypertension: Has been on multiple medications, she follows with PCP and cardiologist, Blood pressure high normal  Taking 20mg  Lasix because of history of LV dysfunction, followed by cardiologist    Diabetic foot exam was normal in 2/16   Examination:   BP 126/76   Pulse 76   Resp 16   Ht 5\' 3"  (1.6 m)   Wt 173 lb 6.4 oz (78.7 kg)   SpO2 96%   BMI 30.72 kg/m   Body mass index is 30.72 kg/m.   Assesment:   Diabetes type 2, uncontrolled:   See history of present illness for detailed discussion of his current management, blood sugar patterns and problems identified Currently in a regimen of basal bolus  insulin, Victoza and Invokamet  Her A1c has been over 8% consistently Fructosamine 335 indicates poor control Difficult to identify her blood sugar about in that she did not bring her monitor today  She also has not checked fasting readings Discussed that she needs to do a few fasting readings and then bring monitor for review in the office this week Will adjust her insulin doses by phone when this is available Most likely can improve her control if she has consistent diet and carbohydrate intake and this needs to be assessed in detail  Also given her the form to fill out her meal intake details along with pre-and postprandial readings to take to the dietitian    Patient Instructions  Check blood sugars on waking up  2-3x per week  Also check blood sugars about 2 hours after a meal and do this after different meals by rotation  Recommended blood sugar levels on waking up is 90-130 and about 2 hours after meal is 130-160  Please bring your blood sugar monitor to each visit, thank you  Keep record of food eaten and sugar before and after meals for 3 days and take to Dietician  Counseling time on subjects discussed above is over 50% of today's 25 minute visit   Linda Lowe 06/11/2016, 9:16 AM

## 2016-06-10 NOTE — Patient Instructions (Addendum)
Check blood sugars on waking up  2-3x per week  Also check blood sugars about 2 hours after a meal and do this after different meals by rotation  Recommended blood sugar levels on waking up is 90-130 and about 2 hours after meal is 130-160  Please bring your blood sugar monitor to each visit, thank you  Keep record of food eaten and sugar before and after meals for 3 days and take to [employer]

## 2016-06-11 ENCOUNTER — Encounter: Payer: Self-pay | Admitting: Endocrinology

## 2016-06-26 ENCOUNTER — Other Ambulatory Visit: Payer: Self-pay | Admitting: Endocrinology

## 2016-06-27 ENCOUNTER — Encounter: Payer: Self-pay | Admitting: Dietician

## 2016-06-27 ENCOUNTER — Encounter: Payer: Federal, State, Local not specified - PPO | Attending: Endocrinology | Admitting: Dietician

## 2016-06-27 DIAGNOSIS — Z794 Long term (current) use of insulin: Secondary | ICD-10-CM | POA: Diagnosis not present

## 2016-06-27 DIAGNOSIS — IMO0002 Reserved for concepts with insufficient information to code with codable children: Secondary | ICD-10-CM

## 2016-06-27 DIAGNOSIS — Z713 Dietary counseling and surveillance: Secondary | ICD-10-CM | POA: Diagnosis present

## 2016-06-27 DIAGNOSIS — E1165 Type 2 diabetes mellitus with hyperglycemia: Secondary | ICD-10-CM | POA: Insufficient documentation

## 2016-06-27 DIAGNOSIS — E118 Type 2 diabetes mellitus with unspecified complications: Secondary | ICD-10-CM

## 2016-06-27 NOTE — Patient Instructions (Signed)
Begin walking every day.  Start with 15 minutes every day this week, then increase it gradually to 30 minutes per day. Breakfast, lunch, dinner daily.  Avoid skipping meals.  Breakfast can be 1/2 cup oatmeal and one egg OR a piece of fruit and an egg. Drink more water!  Choose bottled water or filtered water if you like this best. Decrease your portion size of rice and other starchy foods.  Limit to around 2/3 cup per meal. Increase your non starchy vegetables. Decrease your sweet intake in the afternoon.  Decrease the portion size or look for something with about 15 grams carbohydrate per serving.  Choose healthier options such as a small piece of fruit and peanut butter or 1 ounce cheese.

## 2016-06-27 NOTE — Progress Notes (Signed)
Diabetes Self-Management Education  Visit Type: First/Initial  Appt. Start Time: 0820 Appt. End Time: 0920  06/27/2016  [name], identified by name and date of birth, is a 65 y.o. female with a diagnosis of Diabetes: Type 2.  Other hx includes hyperlipidemia, and HTN.  She is currently taking Novolog 16 units each am and 22 units each HS, Invokamet, victoza and Toujeo 40 units each morning.  She lives with her husband and does all of the shopping and cooking.  She states that this causes her some depression as she feels that she has to do everything.   ASSESSMENT  Height 5' 3.5" (1.613 m), weight 173 lb (78.5 kg). Body mass index is 30.16 kg/m.      Diabetes Self-Management Education - 06/27/16 0831      Visit Information   Visit Type First/Initial     Initial Visit   Diabetes Type Type 2   Are you currently following a meal plan? No   Are you taking your medications as prescribed? Yes   Date Diagnosed ?2009     Health Coping   How would you rate your overall health? Good     Psychosocial Assessment   Patient Belief/Attitude about Diabetes Motivated to manage diabetes   Self-care barriers Low literacy   Self-management support Doctor's office   Other persons present Patient   Patient Concerns Nutrition/Meal planning;Glycemic Control   Special Needs None   Preferred Learning Style No preference indicated   Learning Readiness Ready   How often do you need to have someone help you when you read instructions, pamphlets, or other written materials from your doctor or pharmacy? 1 - Never   What is the last grade level you completed in school? 5th grade in the [place]     Pre-Education Assessment   Patient understands incorporating nutritional management into lifestyle. Needs Review   Patient undertands incorporating physical activity into lifestyle. Needs Review   Patient understands using medications safely. Demonstrates understanding / competency   Patient  understands monitoring blood glucose, interpreting and using results Needs Review   Patient understands prevention, detection, and treatment of acute complications. Needs Review   Patient understands prevention, detection, and treatment of chronic complications. Demonstrates understanding / competency   Patient understands how to develop strategies to address psychosocial issues. Needs Review   Patient understands how to develop strategies to promote health/change behavior. Needs Review     Complications   Last HgB A1C per patient/outside source 8.5 %  02/20/16   How often do you check your blood sugar? 1-2 times/day   Fasting Blood glucose range (mg/dL) 83-382;505-397;673-419;>379   Postprandial Blood glucose range (mg/dL) 02-409;735-329;>924;268-341   Number of hypoglycemic episodes per month 1   Can you tell when your blood sugar is low? Yes   What do you do if your blood sugar is low? eat sweet   Number of hyperglycemic episodes per week 7   Can you tell when your blood sugar is high? No   What do you do if your blood sugar is high? nothing   Have you had a dilated eye exam in the past 12 months? Yes   Have you had a dental exam in the past 12 months? Yes   Are you checking your feet? Yes   How many days per week are you checking your feet? 3     Dietary Intake   Breakfast coffee with cream and sugar OR fried rice in the [place]   Snack (  morning) none   Lunch rice and leftover meat, vegetables  11-12   Snack (afternoon) Klondike bar   [employer], meat or fish and vegetables, and occasional sweet bread   Snack (evening) none   Beverage(s) diet Pepsi, coffee with cream and 1/2 Tablespoon sugar     Exercise   Exercise Type Light (walking / raking leaves)   How many days per week to you exercise? 3   How many minutes per day do you exercise? 10   Total minutes per week of exercise 30     Patient Education   Previous Diabetes Education Yes (please comment)  "A while ago"    Nutrition management  Role of diet in the treatment of diabetes and the relationship between the three main macronutrients and blood glucose level;Food label reading, portion sizes and measuring food.;Meal options for control of blood glucose level and chronic complications.;Meal timing in regards to the patients' current diabetes medication.   Physical activity and exercise  Role of exercise on diabetes management, blood pressure control and cardiac health.;Helped patient identify appropriate exercises in relation to his/her diabetes, diabetes complications and other health issue.   Monitoring Yearly dilated eye exam;Daily foot exams   Acute complications Taught treatment of hypoglycemia - the 15 rule.   Chronic complications Assessed and discussed foot care and prevention of foot problems   Psychosocial adjustment Worked with patient to identify barriers to care and solutions     Individualized Goals (developed by patient)   Nutrition General guidelines for healthy choices and portions discussed   Physical Activity Exercise 5-7 days per week;30 minutes per day   Medications take my medication as prescribed   Reducing Risk treat hypoglycemia with 15 grams of carbs if blood glucose less than 70mg /dL;do foot checks daily;Other (comment)  drink more water     Post-Education Assessment   Patient understands incorporating nutritional management into lifestyle. Demonstrates understanding / competency   Patient undertands incorporating physical activity into lifestyle. Demonstrates understanding / competency   Patient understands using medications safely. Demonstrates understanding / competency   Patient understands monitoring blood glucose, interpreting and using results Demonstrates understanding / competency   Patient understands prevention, detection, and treatment of acute complications. Demonstrates understanding / competency   Patient understands prevention, detection, and treatment of chronic  complications. Demonstrates understanding / competency   Patient understands how to develop strategies to address psychosocial issues. Demonstrates understanding / competency   Patient understands how to develop strategies to promote health/change behavior. Demonstrates understanding / competency     Outcomes   Expected Outcomes Other (comment)  demonstrated interest in learning but question understanding at times and ability to change.   Future DMSE PRN   Program Status Completed      Individualized Plan for Diabetes Self-Management Training:   Learning Objective:  Patient will have a greater understanding of diabetes self-management. Patient education plan is to attend individual and/or group sessions per assessed needs and concerns.   Plan:   Patient Instructions  Begin walking every day.  Start with 15 minutes every day this week, then increase it gradually to 30 minutes per day. Breakfast, lunch, dinner daily.  Avoid skipping meals.  Breakfast can be 1/2 cup oatmeal and one egg OR a piece of fruit and an egg. Drink more water!  Choose bottled water or filtered water if you like this best. Decrease your portion size of rice and other starchy foods.  Limit to around 2/3 cup per meal. Increase your non starchy  vegetables. Decrease your sweet intake in the afternoon.  Decrease the portion size or look for something with about 15 grams carbohydrate per serving.  Choose healthier options such as a small piece of fruit and peanut butter or 1 ounce cheese.     Expected Outcomes:  Other (comment) (demonstrated interest in learning but question understanding at times and ability to change.)  Education material provided: Food label handouts, Meal plan card, My Plate and Snack sheet, basic meal planning  If problems or questions, patient to contact team via:  Phone and Email  Future DSME appointment: PRN

## 2016-07-12 ENCOUNTER — Other Ambulatory Visit: Payer: Self-pay | Admitting: Endocrinology

## 2016-07-18 ENCOUNTER — Other Ambulatory Visit (INDEPENDENT_AMBULATORY_CARE_PROVIDER_SITE_OTHER): Payer: Federal, State, Local not specified - PPO

## 2016-07-18 DIAGNOSIS — Z794 Long term (current) use of insulin: Secondary | ICD-10-CM | POA: Diagnosis not present

## 2016-07-18 DIAGNOSIS — E1165 Type 2 diabetes mellitus with hyperglycemia: Secondary | ICD-10-CM | POA: Diagnosis not present

## 2016-07-18 LAB — HEMOGLOBIN A1C: Hgb A1c MFr Bld: 8.4 % — ABNORMAL HIGH (ref 4.6–6.5)

## 2016-07-18 LAB — GLUCOSE, RANDOM: Glucose, Bld: 117 mg/dL — ABNORMAL HIGH (ref 70–99)

## 2016-07-22 ENCOUNTER — Ambulatory Visit (INDEPENDENT_AMBULATORY_CARE_PROVIDER_SITE_OTHER): Payer: Federal, State, Local not specified - PPO | Admitting: Endocrinology

## 2016-07-22 ENCOUNTER — Encounter: Payer: Self-pay | Admitting: Endocrinology

## 2016-07-22 VITALS — BP 136/84 | HR 63 | Ht 64.0 in | Wt 175.0 lb

## 2016-07-22 DIAGNOSIS — E1165 Type 2 diabetes mellitus with hyperglycemia: Secondary | ICD-10-CM | POA: Diagnosis not present

## 2016-07-22 DIAGNOSIS — Z794 Long term (current) use of insulin: Secondary | ICD-10-CM

## 2016-07-22 NOTE — Patient Instructions (Addendum)
Reduce Novolog at dinner to 14 units and stay on 14 at lunch  Check blood sugars on waking up  2x weekly  Also check blood sugars about 2 hours after a meal and do this after different meals by rotation  Recommended blood sugar levels on waking up is 90-130 and about 2 hours after meal is 130-160  Please bring your blood sugar monitor to each visit, thank you

## 2016-07-22 NOTE — Progress Notes (Signed)
Patient ID: Linda Lowe, female   DOB: Oct 31, 1950, 65 y.o.   MRN: [id number]   Reason for Appointment: Diabetes follow-up   History of Present Illness   Diagnosis: date of diagnosis: ? 2009   Current insulin regimen:       TOUJEO 40 UNITS IN AM ONCE DAILY and Novolog 16 at lunch and 16 at dinner  Non-insulin hypoglycemic drugs: Victoza 1.8 mg daily at lunch.  Invokamet 150/1000 twice a day  A1c is still over 8%  Current blood sugar patterns and problems identified:   She did bring her monitor, previously did not have this.  However her meter has the wrong date and time programmed  She does take the blood sugar readings about 2 hours after lunch and about 2 or more hours after dinner  Not clear why her A1c is still high even though her blood sugars at home recently are fairly close to normal overall  She now says that she is eating toast and an egg in the morning without taking any Novolog but her blood sugar in the lab was 117 after work  She has improved her diet with talking to the dietitian as cutting back on carbohydrates like rice   She also forgets to check her sugars fasting to help adjust her long-term insulin  No overnight hypoglycemia recently  Hypoglycemia: Has 2 slightly  low sugars    Glucometer: Contour.  Blood Glucose readings are being checked  after lunch and dinner   Readings by download::   PRE-MEAL Fasting PC Lunch Dinner Bedtime Overall  Glucose range: ?   54-209   66-199    Mean/median:  112   109     DIET: Usually small portions, at times has more carbohydrate  Meals: 3 meals per day usually at 11 a.m. and 7-8 p.m.   Physical activity: exercise: walking/bike, 20 min, 7 days a week;    Certified Diabetes Educator visit: Most recent:, 2015.  Dietician visit: Most recent: 06/2016     Wt Readings from Last 3 Encounters:  07/22/16 175 lb (79.4 kg)  06/27/16 173 lb (78.5 kg)  06/10/16 173 lb 6.4 oz (78.7 kg)   LABS:  Lab  Results  Component Value Date   HGBA1C 8.4 (H) 07/18/2016   HGBA1C 8.5 (H) 02/20/2016   HGBA1C 8.1 (H) 11/13/2015   Lab Results  Component Value Date   MICROALBUR 9.3 (H) 11/13/2015   LDLCALC 83 11/13/2015   CREATININE 0.76 06/05/2016     PAST history: Her diabetes has been usually poorly controlled. She has been on multiple medications including Victoza and basal bolus regimen also. However she  had difficulty with consistent compliance with insulin, diet and exercise regimen. With starting Victoza her blood sugars appeared to be improved. After repeated sessions with educators she was able to improve her compliance level and blood sugars had been improving since about 10/13. Insulin doses have been increased overall  A1c previously had been usually over 8% and below this for the first time in 2/14. She had been switched to a combination of Invokana and metformin instead of Janumet in late 2014 In 1/15 her blood sugars are excellent with A1c 7%      Lab on 07/18/2016  Component Date Value Ref Range Status  . Hgb A1c MFr Bld 07/18/2016 8.4* 4.6 - 6.5 % Final  . Glucose, Bld 07/18/2016 117* 70 - 99 mg/dL Final      Medication List  Accurate as of 07/22/16 12:58 PM. Always use your most recent med list.          atorvastatin 40 MG tablet Commonly known as:  LIPITOR TAKE 1 TABLET(40 MG) BY MOUTH DAILY   BAYER CONTOUR NEXT TEST test strip Generic drug:  glucose blood USE AS DIRECTED TWICE DAILY   buPROPion 300 MG 24 hr tablet Commonly known as:  WELLBUTRIN XL Take 300 mg by mouth daily.   carvedilol 25 MG tablet Commonly known as:  COREG Take 25 mg by mouth 2 (two) times daily.   EPIPEN 0.3 mg/0.3 mL Devi Generic drug:  EPINEPHrine Inject 0.3 mg into the muscle as needed. For allergic reactions   FLUCELVAX QUADRIVALENT 0.5 ML Susy Generic drug:  Influenza Vac Subunit Quad ADM 0.5ML IM UTD   furosemide 20 MG tablet Commonly known as:  LASIX Take 20 mg  by mouth daily.   insulin aspart 100 UNIT/ML FlexPen Commonly known as:  NOVOLOG FLEXPEN INJECT 16 UNITS INTO THE SKIN 2 (TWO) TIMES DAILY BEFORE LUNCH AND SUPPER.   Insulin Pen Needle 31G X 5 MM Misc Commonly known as:  B-D UF III MINI PEN NEEDLES USE 5 PEN NEEDLES PER DAY AS INSTRUCTED   INVOKAMET [phone] MG Tabs Generic drug:  Canagliflozin-Metformin HCl TAKE 1 TABLET BY MOUTH TWICE DAILY   irbesartan 300 MG tablet Commonly known as:  AVAPRO Take 300 mg by mouth daily.   liraglutide 18 MG/3ML Sopn Commonly known as:  VICTOZA INJECT 1.8mg  SUBCUTANEOUSLY DAILY   MICROLET LANCETS Misc USE AS DIRECTED TO CHECK BLOOD SUGAR TWICE DAILY   naproxen 500 MG tablet Commonly known as:  NAPROSYN Take 500 mg by mouth 2 (two) times daily with a meal.   oxybutynin 5 MG 24 hr tablet Commonly known as:  DITROPAN-XL Take 5 mg by mouth daily.   potassium chloride SA 20 MEQ tablet Commonly known as:  K-DUR,KLOR-CON TAKE 1 TABLET(20 MEQ) BY MOUTH DAILY   TOUJEO SOLOSTAR 300 UNIT/ML Sopn Generic drug:  Insulin Glargine INJECT 40 UNITS INTO THE SKIN DAILY       Allergies: No Known Allergies  Past Medical History:  Diagnosis Date  . Arthritis   . Cardiomyopathy (HCC)    a. 05/2012 Echo: EF 30-35%, Gr 1 DD, mild LVH, mild MR, pericardial effusion  . Diabetes mellitus    a. Dx > 5 y ago  . Hypercholesteremia   . Hypertension   . Pericardial effusion    a. 05/2012 Echo: circumferential pericardial effusion, mostly 72mm, largest pocket posterior - 75mm, mild RA indentation, no RV collapse, no tamponade.    Past Surgical History:  Procedure Laterality Date  . ABDOMINAL HYSTERECTOMY    . BI-VENTRICULAR IMPLANTABLE CARDIOVERTER DEFIBRILLATOR N/A 12/01/2013   Procedure: BI-VENTRICULAR IMPLANTABLE CARDIOVERTER DEFIBRILLATOR  (CRT-D);  Surgeon: Duke Salvia, MD;  Location: [facility] CATH LAB;  Service: Cardiovascular;  Laterality: N/A;  . FOOT SURGERY    . LEFT AND RIGHT HEART  CATHETERIZATION WITH CORONARY ANGIOGRAM  05/26/2012   Procedure: LEFT AND RIGHT HEART CATHETERIZATION WITH CORONARY ANGIOGRAM;  Surgeon: Tonny Bollman, MD;  Location: [facility] CATH LAB;  Service: Cardiovascular;;    Family History  Problem Relation Age of Onset  . Other      No history of CAD  . Tuberculosis Father     died when pt was young.    Social History:  reports that she has quit smoking. She has never used smokeless tobacco. She reports that she does not drink alcohol  or use drugs.  Review of Systems   Lipids: LDL controlled, taking Lipitor 40 mg   Lab Results  Component Value Date   CHOL 150 11/13/2015   HDL 45.90 11/13/2015   LDLCALC 83 11/13/2015   LDLDIRECT 141.0 06/09/2013   TRIG 104.0 11/13/2015   CHOLHDL 3 11/13/2015    Hypertension: Has been on multiple medications, she follows with PCP and cardiologist, Blood pressure high normal  Taking 20mg  Lasix because of history of LV dysfunction, followed by cardiologist   Diabetic foot exam was normal in 6/17  ?  Date of last eye exam   Examination:   BP 136/84   Pulse 63   Ht 5\' 4"  (1.626 m)   Wt 175 lb (79.4 kg)   SpO2 96%   BMI 30.04 kg/m   Body mass index is 30.04 kg/m.   Assesment:   Diabetes type 2, uncontrolled:   See history of present illness for detailed discussion of his current management, blood sugar patterns and problems identified Currently in a regimen of basal bolus insulin, Victoza and Invokamet  Her A1c has been over 8% consistently  Difficult to identify when her blood sugar goes up as her blood sugars at home recently after main meals is fairly good Morning glucose was normal in the lab right after breakfast without any mealtime coverage A1c does not correlate with her blood sugars apparently Again she forgets to check her blood sugars fasting and after breakfast Overall she is apparently doing much better with her diet with carbohydrate restriction after talking to the dietitian Is  also motivated to exercise  For now will continue her regimen unchanged except reduce her suppertime dose to 14 units since she has low normal readings overall after supper Given her specific instructions on blood sugar testing Need to check fructosamine also on her next visit   Patient Instructions  Reduce Novolog at dinner to 14 units and stay on 14 at lunch  Check blood sugars on waking up  2x weekly  Also check blood sugars about 2 hours after a meal and do this after different meals by rotation  Recommended blood sugar levels on waking up is 90-130 and about 2 hours after meal is 130-160  Please bring your blood sugar monitor to each visit, thank you       [facility] 07/22/2016, 12:58 PM

## 2016-07-29 ENCOUNTER — Other Ambulatory Visit: Payer: Self-pay | Admitting: Endocrinology

## 2016-08-05 ENCOUNTER — Encounter: Payer: Self-pay | Admitting: Endocrinology

## 2016-08-05 LAB — HM DIABETES EYE EXAM

## 2016-08-08 ENCOUNTER — Other Ambulatory Visit: Payer: Self-pay | Admitting: Endocrinology

## 2016-08-13 ENCOUNTER — Other Ambulatory Visit: Payer: Self-pay | Admitting: Endocrinology

## 2016-09-10 ENCOUNTER — Other Ambulatory Visit: Payer: Self-pay | Admitting: Endocrinology

## 2016-09-14 ENCOUNTER — Other Ambulatory Visit: Payer: Self-pay | Admitting: Endocrinology

## 2016-09-16 ENCOUNTER — Encounter (HOSPITAL_COMMUNITY): Payer: Self-pay | Admitting: Emergency Medicine

## 2016-09-16 ENCOUNTER — Emergency Department (HOSPITAL_COMMUNITY): Payer: Federal, State, Local not specified - PPO

## 2016-09-16 ENCOUNTER — Emergency Department (HOSPITAL_COMMUNITY)
Admission: EM | Admit: 2016-09-16 | Discharge: 2016-09-16 | Disposition: A | Discharge status: Home / Self Care | Payer: Federal, State, Local not specified - PPO | Attending: Emergency Medicine | Admitting: Emergency Medicine

## 2016-09-16 DIAGNOSIS — R42 Dizziness and giddiness: Secondary | ICD-10-CM | POA: Diagnosis not present

## 2016-09-16 DIAGNOSIS — Z95810 Presence of automatic (implantable) cardiac defibrillator: Secondary | ICD-10-CM | POA: Diagnosis not present

## 2016-09-16 DIAGNOSIS — Z794 Long term (current) use of insulin: Secondary | ICD-10-CM | POA: Diagnosis not present

## 2016-09-16 DIAGNOSIS — Z79899 Other long term (current) drug therapy: Secondary | ICD-10-CM | POA: Insufficient documentation

## 2016-09-16 DIAGNOSIS — E119 Type 2 diabetes mellitus without complications: Secondary | ICD-10-CM | POA: Diagnosis not present

## 2016-09-16 DIAGNOSIS — I110 Hypertensive heart disease with heart failure: Secondary | ICD-10-CM | POA: Insufficient documentation

## 2016-09-16 DIAGNOSIS — R404 Transient alteration of awareness: Secondary | ICD-10-CM | POA: Diagnosis not present

## 2016-09-16 DIAGNOSIS — J181 Lobar pneumonia, unspecified organism: Secondary | ICD-10-CM | POA: Diagnosis not present

## 2016-09-16 DIAGNOSIS — I5022 Chronic systolic (congestive) heart failure: Secondary | ICD-10-CM | POA: Diagnosis not present

## 2016-09-16 DIAGNOSIS — N390 Urinary tract infection, site not specified: Secondary | ICD-10-CM

## 2016-09-16 DIAGNOSIS — R509 Fever, unspecified: Secondary | ICD-10-CM | POA: Diagnosis present

## 2016-09-16 DIAGNOSIS — Z87891 Personal history of nicotine dependence: Secondary | ICD-10-CM | POA: Insufficient documentation

## 2016-09-16 LAB — CBC WITH DIFFERENTIAL/PLATELET
Basophils Absolute: 0 10*3/uL (ref 0.0–0.1)
Basophils Relative: 0 %
Eosinophils Absolute: 0 10*3/uL (ref 0.0–0.7)
Eosinophils Relative: 0 %
HCT: 41 % (ref 36.0–46.0)
Hemoglobin: 13.9 g/dL (ref 12.0–15.0)
Lymphocytes Relative: 13 %
Lymphs Abs: 1.7 10*3/uL (ref 0.7–4.0)
MCH: 29.6 pg (ref 26.0–34.0)
MCHC: 33.9 g/dL (ref 30.0–36.0)
MCV: 87.2 fL (ref 78.0–100.0)
Monocytes Absolute: 0.6 10*3/uL (ref 0.1–1.0)
Monocytes Relative: 5 %
Neutro Abs: 11.3 10*3/uL — ABNORMAL HIGH (ref 1.7–7.7)
Neutrophils Relative %: 82 %
Platelets: 200 10*3/uL (ref 150–400)
RBC: 4.7 MIL/uL (ref 3.87–5.11)
RDW: 12.9 % (ref 11.5–15.5)
WBC: 13.7 10*3/uL — ABNORMAL HIGH (ref 4.0–10.5)

## 2016-09-16 LAB — URINALYSIS, ROUTINE W REFLEX MICROSCOPIC
Bilirubin Urine: NEGATIVE
Glucose, UA: >=500 mg/dL — AB
Ketones, ur: NEGATIVE mg/dL
Nitrite: POSITIVE — AB
Protein, ur: 30 mg/dL — AB
Specific Gravity, Urine: 1.018 (ref 1.005–1.030)
pH: 5 (ref 5.0–8.0)

## 2016-09-16 LAB — COMPREHENSIVE METABOLIC PANEL
ALT: 32 U/L (ref 14–54)
AST: 34 U/L (ref 15–41)
Albumin: 3.4 g/dL — ABNORMAL LOW (ref 3.5–5.0)
Alkaline Phosphatase: 95 U/L (ref 38–126)
Anion gap: 14 (ref 5–15)
BUN: 13 mg/dL (ref 6–20)
CO2: 27 mmol/L (ref 22–32)
Calcium: 9.6 mg/dL (ref 8.9–10.3)
Chloride: 98 mmol/L — ABNORMAL LOW (ref 101–111)
Creatinine, Ser: 1.03 mg/dL — ABNORMAL HIGH (ref 0.44–1.00)
GFR calc Af Amer: >60 mL/min (ref >60)
GFR calc non Af Amer: 56 mL/min — ABNORMAL LOW (ref >60)
Glucose, Bld: 235 mg/dL — ABNORMAL HIGH (ref 65–99)
Potassium: 3 mmol/L — ABNORMAL LOW (ref 3.5–5.1)
Sodium: 139 mmol/L (ref 135–145)
Total Bilirubin: 1.3 mg/dL — ABNORMAL HIGH (ref 0.3–1.2)
Total Protein: 7.3 g/dL (ref 6.5–8.1)

## 2016-09-16 LAB — LIPASE, BLOOD: Lipase: 13 U/L (ref 11–51)

## 2016-09-16 LAB — I-STAT CG4 LACTIC ACID, ED: Lactic Acid, Venous: 2.27 mmol/L (ref 0.5–1.9)

## 2016-09-16 MED ORDER — CEPHALEXIN 250 MG PO CAPS
500.0000 mg | ORAL_CAPSULE | Freq: Once | ORAL | Status: DC
  Filled 2016-09-16: qty 2

## 2016-09-16 MED ORDER — POTASSIUM CHLORIDE CRYS ER 20 MEQ PO TBCR
60.0000 meq | EXTENDED_RELEASE_TABLET | Freq: Once | ORAL | Status: AC
  Administered 2016-09-16: 60 meq via ORAL
  Filled 2016-09-16: qty 3

## 2016-09-16 MED ORDER — IBUPROFEN 400 MG PO TABS
600.0000 mg | ORAL_TABLET | Freq: Once | ORAL | Status: AC
  Administered 2016-09-16: 600 mg via ORAL
  Filled 2016-09-16: qty 1

## 2016-09-16 MED ORDER — CEPHALEXIN 500 MG PO CAPS: 500.0000 mg | ORAL_CAPSULE | Freq: Two times a day (BID) | ORAL | 0 refills | Status: AC

## 2016-09-16 MED ORDER — DEXTROSE 5 % IV SOLN
1.0000 g | Freq: Once | INTRAVENOUS | Status: AC
  Administered 2016-09-16: 1 g via INTRAVENOUS
  Filled 2016-09-16: qty 10

## 2016-09-16 NOTE — ED Triage Notes (Addendum)
Brought by ems after having dizziness that has resolved for 15 minutes.  Temp 104.3 when ems arrived.  Given tylenol 1000mg .  Also reports nausea since Thursday but no vomiting.  Spouse reports patient has had a nonproductive cough since Wednesday.  Patient reports chills yesterday.  Never checked a temperature.

## 2016-09-16 NOTE — ED Notes (Signed)
Pt was sleeping with saturations in 85-89 % placed on 2 liters and improved to 95 %.

## 2016-09-16 NOTE — ED Provider Notes (Addendum)
[facility]-EMERGENCY DEPT Provider Note    By signing my name below, I, Earmon Phoenix, attest that this documentation has been prepared under the direction and in the presence of Tomasita Crumble, MD. Electronically Signed: Earmon Phoenix, ED Scribe. 09/16/16. 3:35 AM.    History   Chief Complaint Chief Complaint  Patient presents with  . Fever   The history is provided by the patient and medical records. No language interpreter was used.    HPI Comments:  ZERLINE MELCHIOR is a 66 y.o. female with PMHx cardiomyopathy, T2DM, HLD, HTN, CHF and LBBB brought in by EMS, who presents to the Emergency Department complaining of intermittent dizziness that began three days ago. She reports fever Tmax 103 degrees, chills and a nonproductive cough. She reports the symptoms resolved but then returned yesterday. She reports her husband has also had a cough but denies any other sick contacts. Pt has taken Ibuprofen and Tylenol with some relief of the fever. EMS reports giving Tylenol 1 gram en route. She denies modifying factors. She denies abdominal pain, nausea, vomiting, rhinorrhea, dysuria, hematuria.   Past Medical History:  Diagnosis Date  . Arthritis   . Cardiomyopathy (HCC)    a. 05/2012 Echo: EF 30-35%, Gr 1 DD, mild LVH, mild MR, pericardial effusion  . Diabetes mellitus    a. Dx > 5 y ago  . Hypercholesteremia   . Hypertension   . Pericardial effusion    a. 05/2012 Echo: circumferential pericardial effusion, mostly 7mm, largest pocket posterior - 85mm, mild RA indentation, no RV collapse, no tamponade.    Patient Active Problem List   Diagnosis Date Noted  . Nonischemic cardiomyopathy (HCC) 08/26/2013  . Chronic systolic heart failure (HCC) 08/26/2013  . LBBB (left bundle branch block) 08/26/2013  . Hyperlipidemia 06/23/2013  . Type II or unspecified type diabetes mellitus without mention of complication, uncontrolled 04/14/2013  . HTN (hypertension) 05/23/2012    Past Surgical  History:  Procedure Laterality Date  . ABDOMINAL HYSTERECTOMY    . BI-VENTRICULAR IMPLANTABLE CARDIOVERTER DEFIBRILLATOR N/A 12/01/2013   Procedure: BI-VENTRICULAR IMPLANTABLE CARDIOVERTER DEFIBRILLATOR  (CRT-D);  Surgeon: Duke Salvia, MD;  Location: [facility] CATH LAB;  Service: Cardiovascular;  Laterality: N/A;  . FOOT SURGERY    . LEFT AND RIGHT HEART CATHETERIZATION WITH CORONARY ANGIOGRAM  05/26/2012   Procedure: LEFT AND RIGHT HEART CATHETERIZATION WITH CORONARY ANGIOGRAM;  Surgeon: Tonny Bollman, MD;  Location: [facility] CATH LAB;  Service: Cardiovascular;;    OB History    No data available       Home Medications    Prior to Admission medications   Medication Sig Start Date End Date Taking? Authorizing Provider  atorvastatin (LIPITOR) 40 MG tablet TAKE 1 TABLET(40 MG) BY MOUTH DAILY 06/26/16  Yes Reather Littler, MD  buPROPion (WELLBUTRIN XL) 300 MG 24 hr tablet Take 300 mg by mouth daily.   Yes Historical Provider, MD  carvedilol (COREG) 25 MG tablet Take 25 mg by mouth 2 (two) times daily. 07/15/15  Yes Historical Provider, MD  EPINEPHrine (EPIPEN) 0.3 mg/0.3 mL DEVI Inject 0.3 mg into the muscle as needed (for allergic reaction). For allergic reactions    Yes Historical Provider, MD  furosemide (LASIX) 20 MG tablet Take 20 mg by mouth daily.   Yes Historical Provider, MD  INVOKAMET [phone] MG TABS TAKE 1 TABLET BY MOUTH TWICE DAILY 08/13/16  Yes Reather Littler, MD  irbesartan (AVAPRO) 300 MG tablet Take 300 mg by mouth daily.   Yes Historical Provider, MD  naproxen (NAPROSYN) 500 MG tablet Take 500 mg by mouth 2 (two) times daily with a meal.   Yes Historical Provider, MD  NOVOLOG FLEXPEN 100 UNIT/ML FlexPen ADMINISTER 16 UNITS UNDER THE SKIN TWICE DAILY BEFORE LUNCH AND SUPPER 09/10/16  Yes Reather Littler, MD  oxybutynin (DITROPAN-XL) 5 MG 24 hr tablet Take 5 mg by mouth daily.   Yes Historical Provider, MD  potassium chloride SA (K-DUR,KLOR-CON) 20 MEQ tablet TAKE 1 TABLET(20 MEQ) BY MOUTH DAILY  06/07/16  Yes Reather Littler, MD  TOUJEO SOLOSTAR 300 UNIT/ML SOPN INJECT 40 UNITS INTO THE SKIN DAILY 04/11/16  Yes Reather Littler, MD  VICTOZA 18 MG/3ML SOPN INJECT 1.8 MG UNDER THE SKIN DAILY 08/08/16  Yes Reather Littler, MD  BAYER CONTOUR NEXT TEST test strip TEST BLOOD SUGAR LEVELS TWICE DAILY 07/29/16   Reather Littler, MD  cephALEXin (KEFLEX) 500 MG capsule Take 1 capsule (500 mg total) by mouth 2 (two) times daily. 09/16/16 09/23/16  Tomasita Crumble, MD  Insulin Pen Needle (B-D UF III MINI PEN NEEDLES) 31G X 5 MM MISC USE 5 PEN NEEDLES PER DAY AS INSTRUCTED 10/20/15   Reather Littler, MD  MICROLET LANCETS MISC USE AS DIRECTED TO CHECK BLOOD SUGAR TWICE DAILY 03/19/16   Reather Littler, MD    Family History Family History  Problem Relation Age of Onset  . Other      No history of CAD  . Tuberculosis Father     died when pt was young.    Social History Social History  Substance Use Topics  . Smoking status: Former Games developer  . Smokeless tobacco: Never Used     Comment: Quit "long time ago."  . Alcohol use No     Allergies   Patient has no known allergies.   Review of Systems Review of Systems A complete 10 system review of systems was obtained and all systems are negative except as noted in the HPI and PMH.    Physical Exam Updated Vital Signs Ht 5' 3.5" (1.613 m)   Wt 174 lb (78.9 kg)   SpO2 97%   BMI 30.34 kg/m   Physical Exam  Constitutional: She is oriented to person, place, and time. She appears well-developed and well-nourished. No distress.  HENT:  Head: Normocephalic and atraumatic.  Nose: Nose normal.  Mouth/Throat: Oropharynx is clear and moist. No oropharyngeal exudate.  Eyes: Conjunctivae and EOM are normal. Pupils are equal, round, and reactive to light. No scleral icterus.  Neck: Normal range of motion. Neck supple. No JVD present. No tracheal deviation present. No thyromegaly present.  Cardiovascular: Normal rate, regular rhythm and normal heart sounds.  Exam reveals no gallop and no  friction rub.   No murmur heard. Pulmonary/Chest: Effort normal and breath sounds normal. No respiratory distress. She has no wheezes. She exhibits no tenderness.  Abdominal: Soft. Bowel sounds are normal. She exhibits no distension and no mass. There is no tenderness. There is no rebound and no guarding.  Musculoskeletal: Normal range of motion. She exhibits no edema or tenderness.  Lymphadenopathy:    She has no cervical adenopathy.  Neurological: She is alert and oriented to person, place, and time. No cranial nerve deficit. She exhibits normal muscle tone.  Skin: Skin is warm and dry. No rash noted. No erythema. No pallor.  Tactile fever.  Nursing note and vitals reviewed.    ED Treatments / Results  DIAGNOSTIC STUDIES: Oxygen Saturation is 97% on RA, normal by my interpretation.   COORDINATION OF CARE:  1:59 AM- Will order Motrin, CXR and labs. Pt verbalizes understanding and agrees to plan.  Medications  cefTRIAXone (ROCEPHIN) 1 g in dextrose 5 % 50 mL IVPB (1 g Intravenous New Bag/Given 09/16/16 0528)  ibuprofen (ADVIL,MOTRIN) tablet 600 mg (600 mg Oral Given 09/16/16 0238)  potassium chloride SA (K-DUR,KLOR-CON) CR tablet 60 mEq (60 mEq Oral Given 09/16/16 0305)   Labs (all labs ordered are listed, but only abnormal results are displayed) Labs Reviewed  COMPREHENSIVE METABOLIC PANEL - Abnormal; Notable for the following:       Result Value   Potassium 3.0 (*)    Chloride 98 (*)    Glucose, Bld 235 (*)    Creatinine, Ser 1.03 (*)    Albumin 3.4 (*)    Total Bilirubin 1.3 (*)    GFR calc non Af Amer 56 (*)    All other components within normal limits  CBC WITH DIFFERENTIAL/PLATELET - Abnormal; Notable for the following:    WBC 13.7 (*)    Neutro Abs 11.3 (*)    All other components within normal limits  URINALYSIS, ROUTINE W REFLEX MICROSCOPIC - Abnormal; Notable for the following:    APPearance HAZY (*)    Glucose, UA >=500 (*)    Hgb urine dipstick SMALL (*)     Protein, ur 30 (*)    Nitrite POSITIVE (*)    Leukocytes, UA SMALL (*)    Bacteria, UA MANY (*)    Squamous Epithelial / LPF 0-5 (*)    All other components within normal limits  URINE CULTURE  LIPASE, BLOOD  I-STAT CG4 LACTIC ACID, ED    EKG  EKG Interpretation None       Radiology Dg Chest 2 View  Result Date: 09/16/2016 CLINICAL DATA:  Cough, fever and dizziness for a few days. History of hypertension, diabetes, ex-smoker. EXAM: CHEST  2 VIEW COMPARISON:  Chest radiograph December 02, 2013 FINDINGS: The cardiac silhouette is normal. Mildly tortuous aorta associated with chronic hypertension. No pleural effusion or focal consolidation. Multifocal lung linear densities. No pleural effusion focal consolidation. No pneumothorax. Three lead LEFT cardiac defibrillator in situ. Soft tissue planes and included osseous structures are nonsuspicious. IMPRESSION: Scattered atelectasis/scarring. Electronically Signed   By: Awilda Metro M.D.   On: 09/16/2016 02:25   Ct Chest Wo Contrast  Result Date: 09/16/2016 CLINICAL DATA:  Fever and cough.  Hypoxia. EXAM: CT CHEST WITHOUT CONTRAST TECHNIQUE: Multidetector CT imaging of the chest was performed following the standard protocol without IV contrast. COMPARISON:  Radiographs 09/16/2016, CT 05/25/2012 FINDINGS: Cardiovascular: Mild atherosclerotic calcification of the aorta and coronary arteries. Normal caliber aorta. Mediastinum/Nodes: No pathologic adenopathy in the mediastinum or hila. Axillary regions are unremarkable. Esophagus is unremarkable. Lungs/Pleura: Stable scarring in both bases. No airspace consolidation. No masses or nodules. Airways are patent and normal in caliber. No pleural effusions. Upper Abdomen: No significant abnormality. Musculoskeletal: No significant skeletal lesion. Moderate thoracic degenerative disc disease is present. IMPRESSION: The lungs are clear except for stable basilar scarring. No consolidation or effusion.  Electronically Signed   By: Ellery Plunk M.D.   On: 09/16/2016 05:27    Procedures Procedures (including critical care time)  Medications Ordered in ED Medications  cefTRIAXone (ROCEPHIN) 1 g in dextrose 5 % 50 mL IVPB (1 g Intravenous New Bag/Given 09/16/16 0528)  ibuprofen (ADVIL,MOTRIN) tablet 600 mg (600 mg Oral Given 09/16/16 0238)  potassium chloride SA (K-DUR,KLOR-CON) CR tablet 60 mEq (60 mEq Oral Given 09/16/16 0305)  Initial Impression / Assessment and Plan / ED Course  I have reviewed the triage vital signs and the nursing notes.  Pertinent labs & imaging results that were available during my care of the patient were reviewed by me and considered in my medical decision making (see chart for details).  Clinical Course    Patient presents to the ED for fever and dizziness with cough.  She had a high fever that was treated with tylenol by EMS.  On my check, she is still 101.3.  Ibuprofen ordered.  CXR is pending as well as UA.  CXR neg for pneumonia, but shows multifocal lung densities.  UA shows a UTI.   She has fever with WBC of 13.  Will give keflex for UTI.  She now is intermittently hypoxic even while awake.  CT chest pending to eval for possible pneumonia.  Walking pulse ox was 94% without tachypnea.     CT negative for pneumonia.  Plan to treat UTI and advise PCP fu.  VS remain within her normal limits and she is safe for DC.   I personally performed the services described in this documentation, which was scribed in my presence. The recorded information has been reviewed and is accurate.     Final Clinical Impressions(s) / ED Diagnoses   Final diagnoses:  Community acquired pneumonia of left lower lobe of lung (HCC)  Urinary tract infection without hematuria, site unspecified    New Prescriptions New Prescriptions   CEPHALEXIN (KEFLEX) 500 MG CAPSULE    Take 1 capsule (500 mg total) by mouth 2 (two) times daily.     Tomasita Crumble, MD 09/16/16 2706     Tomasita Crumble, MD 09/16/16 [phone]

## 2016-09-16 NOTE — ED Notes (Signed)
Patient aware of need for urine specimen.  Drinking water.  Does not want to be cathed.

## 2016-09-16 NOTE — ED Notes (Signed)
Taken to CT at this time. 

## 2016-09-16 NOTE — ED Notes (Signed)
Ambulated in hall on room air while checking O2 sats.  Sat noted to fluctuate between 88-94% with good pleth.  Noted to be sob with exertion.

## 2016-09-19 LAB — URINE CULTURE: Culture: >=100000 — AB

## 2016-09-20 ENCOUNTER — Other Ambulatory Visit: Payer: Federal, State, Local not specified - PPO

## 2016-09-20 ENCOUNTER — Telehealth: Payer: Self-pay

## 2016-09-20 NOTE — Telephone Encounter (Signed)
Post ED Visit - Positive Culture Follow-up  Culture report reviewed by antimicrobial stewardship pharmacist:  []  , Pharm.D. []  Enzo Bi, Pharm.D., BCPS []  , Pharm.D. []  Celedonio Miyamoto, Pharm.D., BCPS []  [place], Garvin Fila.D., BCPS, AAHIVP []  , Pharm.D., BCPS, AAHIVP []  Georgina Pillion, Pharm.D. []  , [place].D. J Arminger Pharm D Positive urine culture Treated with Cephalexin, organism sensitive to the same and no further patient follow-up is required at this time.  [address] 09/20/2016, 9:42 AM

## 2016-09-23 ENCOUNTER — Other Ambulatory Visit: Payer: Federal, State, Local not specified - PPO

## 2016-09-24 ENCOUNTER — Other Ambulatory Visit (INDEPENDENT_AMBULATORY_CARE_PROVIDER_SITE_OTHER): Payer: Federal, State, Local not specified - PPO

## 2016-09-24 DIAGNOSIS — Z794 Long term (current) use of insulin: Secondary | ICD-10-CM

## 2016-09-24 DIAGNOSIS — E1165 Type 2 diabetes mellitus with hyperglycemia: Secondary | ICD-10-CM | POA: Diagnosis not present

## 2016-09-24 LAB — COMPREHENSIVE METABOLIC PANEL
ALT: 18 U/L (ref 0–35)
AST: 18 U/L (ref 0–37)
Albumin: 4 g/dL (ref 3.5–5.2)
Alkaline Phosphatase: 97 U/L (ref 39–117)
BUN: 16 mg/dL (ref 6–23)
CO2: 27 mEq/L (ref 19–32)
Calcium: 9.8 mg/dL (ref 8.4–10.5)
Chloride: 98 mEq/L (ref 96–112)
Creatinine, Ser: 0.7 mg/dL (ref 0.40–1.20)
GFR: 89.47 mL/min (ref >60.00)
Glucose, Bld: 170 mg/dL — ABNORMAL HIGH (ref 70–99)
Potassium: 3.7 mEq/L (ref 3.5–5.1)
Sodium: 135 mEq/L (ref 135–145)
Total Bilirubin: 0.4 mg/dL (ref 0.2–1.2)
Total Protein: 8.7 g/dL — ABNORMAL HIGH (ref 6.0–8.3)

## 2016-09-24 LAB — MICROALBUMIN / CREATININE URINE RATIO
Creatinine,U: 62.3 mg/dL
Microalb Creat Ratio: 2.1 mg/g (ref 0.0–30.0)
Microalb, Ur: 1.3 mg/dL (ref 0.0–1.9)

## 2016-09-25 ENCOUNTER — Ambulatory Visit: Payer: Federal, State, Local not specified - PPO | Admitting: Endocrinology

## 2016-09-25 LAB — FRUCTOSAMINE: Fructosamine: 301 umol/L — ABNORMAL HIGH (ref 0–285)

## 2016-09-27 ENCOUNTER — Encounter: Payer: Self-pay | Admitting: Endocrinology

## 2016-09-27 ENCOUNTER — Ambulatory Visit (INDEPENDENT_AMBULATORY_CARE_PROVIDER_SITE_OTHER): Payer: Federal, State, Local not specified - PPO | Admitting: Endocrinology

## 2016-09-27 VITALS — BP 120/72 | HR 74 | Ht 64.0 in | Wt 164.0 lb

## 2016-09-27 DIAGNOSIS — Z794 Long term (current) use of insulin: Secondary | ICD-10-CM

## 2016-09-27 DIAGNOSIS — E1165 Type 2 diabetes mellitus with hyperglycemia: Secondary | ICD-10-CM

## 2016-09-27 DIAGNOSIS — I10 Essential (primary) hypertension: Secondary | ICD-10-CM | POA: Diagnosis not present

## 2016-09-27 NOTE — Patient Instructions (Addendum)
Check blood sugars on waking up  2-3x weekly  Also check blood sugars about 2 hours after a meal and do this after different meals by rotation  Recommended blood sugar levels on waking up is 90-130 and about 2 hours after meal is 130-160  Please bring your blood sugar monitor to each visit, thank you  Novolog 14 at lunch and 12 at dinner  Toujeo 42 units at dinner  Call sugars in 1 week in am

## 2016-09-27 NOTE — Progress Notes (Signed)
Patient ID: Linda Lowe, female   DOB: 08/16/1951, 66 y.o.   MRN: [id number]   Reason for Appointment: Diabetes follow-up   History of Present Illness   Diagnosis: date of diagnosis: ? 2009   Current insulin regimen:       TOUJEO 40 UNITS IN AM ONCE DAILY and Novolog 16 at lunch and 14 at dinner  Non-insulin hypoglycemic drugs: Victoza 1.8 mg daily at lunch.  Invokamet 150/1000 twice a day  A1c has been over 8% consistently Fructosamine is now at 301  Current blood sugar patterns and problems identified:   She did bring her monitor and download reviewed the patient  Despite reminders she does not check her blood sugar fasting and her glucose was 170 in the lab, she thinks she only had coffee that morning  She is checking her blood sugars twice a day about 2 hours after her to meals  Overall blood sugars are fairly near normal and have only mild variability.  She does have sporadic low blood sugars, mostly last month after supper with only one reading below 60  Blood sugars after her first meal are fairly stable  She has only one unusually high reading at 4 PM of 282  No overnight hypoglycemia reported  Hypoglycemia: Has 2 slightly  low sugars after supper   Glucometer: Contour.  Blood Glucose readings are being checked  after lunch and dinner only  Readings by download:  Mean values apply above for all meters except median for One Touch  POST-MEAL PC Breakfast PC Lunch PC Dinner  Glucose range:  69-191  57-182   Mean/median:  108  103    DIET: Usually small portions, at times has more carbohydrate  Meals: 2 meals per day usually at 11 a.m. and 7-8 p.m.   Physical activity: exercise: walking/bike, 20 min, 7 days a week;    Certified Diabetes Educator visit: Most recent:, 2015.  Dietician visit: Most recent: 06/2016     Wt Readings from Last 3 Encounters:  09/27/16 164 lb (74.4 kg)  09/16/16 174 lb (78.9 kg)  07/22/16 175 lb (79.4 kg)    LABS:  Lab Results  Component Value Date   HGBA1C 8.4 (H) 07/18/2016   HGBA1C 8.5 (H) 02/20/2016   HGBA1C 8.1 (H) 11/13/2015   Lab Results  Component Value Date   MICROALBUR 1.3 09/24/2016   LDLCALC 83 11/13/2015   CREATININE 0.70 09/24/2016     PAST history: Her diabetes has been usually poorly controlled. She has been on multiple medications including Victoza and basal bolus regimen also. However she  had difficulty with consistent compliance with insulin, diet and exercise regimen. With starting Victoza her blood sugars appeared to be improved. After repeated sessions with educators she was able to improve her compliance level and blood sugars had been improving since about 10/13. Insulin doses have been increased overall  A1c previously had been usually over 8% and below this for the first time in 2/14. She had been switched to a combination of Invokana and metformin instead of Janumet in late 2014 In 1/15 her blood sugars are excellent with A1c 7%      Lab on 09/24/2016  Component Date Value Ref Range Status  . Sodium 09/24/2016 135  135 - 145 mEq/L Final  . Potassium 09/24/2016 3.7  3.5 - 5.1 mEq/L Final  . Chloride 09/24/2016 98  96 - 112 mEq/L Final  . CO2 09/24/2016 27  19 - 32 mEq/L Final  .  Glucose, Bld 09/24/2016 170* 70 - 99 mg/dL Final  . BUN 23/76/2831 16  6 - 23 mg/dL Final  . Creatinine, Ser 09/24/2016 0.70  0.40 - 1.20 mg/dL Final  . Total Bilirubin 09/24/2016 0.4  0.2 - 1.2 mg/dL Final  . Alkaline Phosphatase 09/24/2016 97  39 - 117 U/L Final  . AST 09/24/2016 18  0 - 37 U/L Final  . ALT 09/24/2016 18  0 - 35 U/L Final  . Total Protein 09/24/2016 8.7* 6.0 - 8.3 g/dL Final  . Albumin 51/76/1607 4.0  3.5 - 5.2 g/dL Final  . Calcium 37/06/6268 9.8  8.4 - 10.5 mg/dL Final  . GFR 48/54/6270 89.47  >60.00 mL/min Final  . Fructosamine 09/25/2016 301* 0 - 285 umol/L Final   Comment: Published reference interval for apparently healthy subjects between age 39  and 81 is 14 - 285 umol/L and in a poorly controlled diabetic population is 228 - 563 umol/L with a mean of 396 umol/L.   Marland Kitchen Microalb, Ur 09/24/2016 1.3  0.0 - 1.9 mg/dL Final  . Creatinine,U 35/00/9381 62.3  mg/dL Final  . Microalb Creat Ratio 09/24/2016 2.1  0.0 - 30.0 mg/g Final    Allergies as of 09/27/2016   No Known Allergies     Medication List       Accurate as of 09/27/16 11:59 PM. Always use your most recent med list.          amLODipine 5 MG tablet Commonly known as:  NORVASC TK 1 T PO QD   atorvastatin 40 MG tablet Commonly known as:  LIPITOR TAKE 1 TABLET(40 MG) BY MOUTH DAILY   BAYER CONTOUR NEXT TEST test strip Generic drug:  glucose blood TEST BLOOD SUGAR LEVELS TWICE DAILY   buPROPion 300 MG 24 hr tablet Commonly known as:  WELLBUTRIN XL Take 300 mg by mouth daily.   carvedilol 25 MG tablet Commonly known as:  COREG Take 25 mg by mouth 2 (two) times daily.   EPIPEN 0.3 mg/0.3 mL Devi Generic drug:  EPINEPHrine Inject 0.3 mg into the muscle as needed (for allergic reaction). For allergic reactions   furosemide 20 MG tablet Commonly known as:  LASIX Take 20 mg by mouth daily.   Insulin Pen Needle 31G X 5 MM Misc Commonly known as:  B-D UF III MINI PEN NEEDLES USE 5 PEN NEEDLES PER DAY AS INSTRUCTED   INVOKAMET [phone] MG Tabs Generic drug:  Canagliflozin-Metformin HCl TAKE 1 TABLET BY MOUTH TWICE DAILY   irbesartan 300 MG tablet Commonly known as:  AVAPRO Take 300 mg by mouth daily.   MICROLET LANCETS Misc USE AS DIRECTED TO CHECK BLOOD SUGAR TWICE DAILY   naproxen 500 MG tablet Commonly known as:  NAPROSYN Take 500 mg by mouth 2 (two) times daily with a meal.   NOVOLOG FLEXPEN 100 UNIT/ML FlexPen Generic drug:  insulin aspart ADMINISTER 16 UNITS UNDER THE SKIN TWICE DAILY BEFORE LUNCH AND SUPPER   oxybutynin 5 MG 24 hr tablet Commonly known as:  DITROPAN-XL Take 5 mg by mouth daily.   potassium chloride SA 20 MEQ  tablet Commonly known as:  K-DUR,KLOR-CON TAKE 1 TABLET(20 MEQ) BY MOUTH DAILY   TOUJEO SOLOSTAR 300 UNIT/ML Sopn Generic drug:  Insulin Glargine INJECT 40 UNITS INTO THE SKIN DAILY   VICTOZA 18 MG/3ML Sopn Generic drug:  liraglutide INJECT 1.8 MG UNDER THE SKIN DAILY       Allergies: No Known Allergies  Past Medical History:  Diagnosis Date  .  Arthritis   . Cardiomyopathy (HCC)    a. 05/2012 Echo: EF 30-35%, Gr 1 DD, mild LVH, mild MR, pericardial effusion  . Diabetes mellitus    a. Dx > 5 y ago  . Hypercholesteremia   . Hypertension   . Pericardial effusion    a. 05/2012 Echo: circumferential pericardial effusion, mostly 62mm, largest pocket posterior - 39mm, mild RA indentation, no RV collapse, no tamponade.    Past Surgical History:  Procedure Laterality Date  . ABDOMINAL HYSTERECTOMY    . BI-VENTRICULAR IMPLANTABLE CARDIOVERTER DEFIBRILLATOR N/A 12/01/2013   Procedure: BI-VENTRICULAR IMPLANTABLE CARDIOVERTER DEFIBRILLATOR  (CRT-D);  Surgeon: Duke Salvia, MD;  Location: [facility] CATH LAB;  Service: Cardiovascular;  Laterality: N/A;  . FOOT SURGERY    . LEFT AND RIGHT HEART CATHETERIZATION WITH CORONARY ANGIOGRAM  05/26/2012   Procedure: LEFT AND RIGHT HEART CATHETERIZATION WITH CORONARY ANGIOGRAM;  Surgeon: Tonny Bollman, MD;  Location: [facility] CATH LAB;  Service: Cardiovascular;;    Family History  Problem Relation Age of Onset  . Other      No history of CAD  . Tuberculosis Father     died when pt was young.    Social History:  reports that she has quit smoking. She has never used smokeless tobacco. She reports that she does not drink alcohol or use drugs.  Review of Systems   Lipids: LDL controlled, taking Lipitor 40 mg   Lab Results  Component Value Date   CHOL 150 11/13/2015   HDL 45.90 11/13/2015   LDLCALC 83 11/13/2015   LDLDIRECT 141.0 06/09/2013   TRIG 104.0 11/13/2015   CHOLHDL 3 11/13/2015    Hypertension: Has been on multiple medications, she  follows with PCP and cardiologist, Blood pressure Improved recently with weight loss  Taking 20mg  Lasix because of history of LV dysfunction, followed by cardiologist   Diabetic foot exam was normal in 6/17  ?  Date of last eye exam   Examination:   BP 120/72   Pulse 74   Ht 5\' 4"  (1.626 m)   Wt 164 lb (74.4 kg)   BMI 28.15 kg/m   Body mass index is 28.15 kg/m.   Assesment:   Diabetes type 2, uncontrolled:   See history of present illness for detailed discussion of his current management, blood sugar patterns and problems identified Currently in a regimen of basal bolus insulin, Victoza and Invokamet  Her A1c has been over 8% consistently but fructosamine is not significantly high at 301 A1c may be falsely high  Unclear when she is having high readings as her that sugars after lunch and supper are practically normal at home and occasionally low normal also She does have sporadic high readings but rarely over 180 recently Recently has been trying to watch her diet better with cutting back on carbohydrates and has lost weight Exercise level has been limited because of cold weather  Most likely she will have high readings in the morning as judged by her fasting labwork but she continues to forget to check her readings in the mornings  Discussed that she will need to call her fasting blood sugar readings back in about a week to help decide on the dose of Cogentin Meanwhile most likely since her Toujeo will be more effective for fasting hyperglycemia with an evening dosage compared to the morning dosage she will switch the time of the injection and go up by 2 units for now  She will reduce her mealtime doses by 2 units in  the meantime to potentially avoid hypoglycemia especially as she continues to improve diet   Patient Instructions  Check blood sugars on waking up  2-3x weekly  Also check blood sugars about 2 hours after a meal and do this after different meals by  rotation  Recommended blood sugar levels on waking up is 90-130 and about 2 hours after meal is 130-160  Please bring your blood sugar monitor to each visit, thank you  Novolog 14 at lunch and 12 at dinner  Toujeo 42 units at dinner  Call sugars in 1 week in am  Counseling time on subjects discussed above is over 50% of today's 25 minute visit   Linda Lowe 09/28/2016, 2:41 PM

## 2016-09-28 DIAGNOSIS — E1165 Type 2 diabetes mellitus with hyperglycemia: Secondary | ICD-10-CM | POA: Insufficient documentation

## 2016-09-28 DIAGNOSIS — Z794 Long term (current) use of insulin: Secondary | ICD-10-CM | POA: Insufficient documentation

## 2016-10-01 ENCOUNTER — Telehealth: Payer: Self-pay

## 2016-10-01 NOTE — Telephone Encounter (Signed)
Can not afford the contour next strips because insurance will not cover it please advide

## 2016-10-01 NOTE — Telephone Encounter (Signed)
She needs to find out what brand they will cover

## 2016-10-04 ENCOUNTER — Other Ambulatory Visit: Payer: Self-pay

## 2016-10-04 ENCOUNTER — Telehealth: Payer: Self-pay | Admitting: Endocrinology

## 2016-10-04 MED ORDER — GLUCOSE BLOOD VI STRP: ORAL_STRIP | 5 refills | Status: DC

## 2016-10-04 NOTE — Telephone Encounter (Signed)
Gave her one-touch meter and ordered test strips 10/04/16

## 2016-10-04 NOTE — Telephone Encounter (Signed)
Ordered test strips and patient came to office to pick up a one-touch meter

## 2016-10-04 NOTE — Telephone Encounter (Signed)
Yes, checks 2x daily

## 2016-10-04 NOTE — Telephone Encounter (Signed)
Need refill of test strips free style lite meter and strips  [id number] - [place], [place] - [address] AT [facility] OF [address] & [address] [phone] (Phone) [phone] (Fax)

## 2016-10-04 NOTE — Telephone Encounter (Signed)
Spoke with patient and onetouch verio is covered is it ok to prescribe meter and strips please advise

## 2016-10-09 ENCOUNTER — Other Ambulatory Visit: Payer: Self-pay

## 2016-10-09 MED ORDER — ONETOUCH ULTRASOFT LANCETS MISC: 12 refills | Status: DC

## 2016-10-09 NOTE — Telephone Encounter (Signed)
Pt needs the lancets for the one touch machine we gave her, she is out and needs it today

## 2016-10-09 NOTE — Telephone Encounter (Signed)
Patient called [facility], she need needles for new monitor

## 2016-10-09 NOTE — Telephone Encounter (Signed)
Ordered 10/09/16 

## 2016-10-14 ENCOUNTER — Telehealth: Payer: Self-pay | Admitting: Endocrinology

## 2016-10-14 MED ORDER — INSULIN PEN NEEDLE 31G X 5 MM MISC: 10 refills | Status: AC

## 2016-10-14 MED ORDER — ONETOUCH ULTRASOFT LANCETS MISC: 12 refills | Status: DC

## 2016-10-14 NOTE — Telephone Encounter (Signed)
Refill submitted. 

## 2016-10-14 NOTE — Telephone Encounter (Signed)
Pt called in requesting refills of her test strips and her needles be sent to [facility] on [place].

## 2016-10-28 ENCOUNTER — Other Ambulatory Visit: Payer: Self-pay

## 2016-10-28 MED ORDER — ONETOUCH DELICA LANCETS FINE MISC: 4 refills | Status: DC

## 2016-11-04 ENCOUNTER — Other Ambulatory Visit: Payer: Self-pay | Admitting: Endocrinology

## 2016-11-05 ENCOUNTER — Other Ambulatory Visit: Payer: Self-pay | Admitting: Endocrinology

## 2016-11-05 ENCOUNTER — Other Ambulatory Visit: Payer: Self-pay

## 2016-11-05 ENCOUNTER — Telehealth: Payer: Self-pay | Admitting: Endocrinology

## 2016-11-05 MED ORDER — CANAGLIFLOZIN-METFORMIN HCL 150-1000 MG PO TABS: 1.0000 | ORAL_TABLET | Freq: Two times a day (BID) | ORAL | 0 refills | Status: DC

## 2016-11-05 NOTE — Telephone Encounter (Signed)
Ordered

## 2016-11-05 NOTE — Telephone Encounter (Signed)
invokamet needs a refill called into [facility]

## 2016-11-11 ENCOUNTER — Telehealth: Payer: Self-pay | Admitting: Endocrinology

## 2016-11-11 NOTE — Telephone Encounter (Signed)
[employer] called stated PA was approved on the wrong  Medication Invokamet XR,    Patient takes Invokamet extended release   Please advise   Phone # [phone] opt 2 opt 0

## 2016-11-12 ENCOUNTER — Other Ambulatory Visit: Payer: Self-pay

## 2016-11-12 MED ORDER — CANAGLIFLOZIN-METFORMIN HCL 150-1000 MG PO TABS: 1.0000 | ORAL_TABLET | Freq: Two times a day (BID) | ORAL | 0 refills | Status: DC

## 2016-11-12 NOTE — Telephone Encounter (Signed)
PA has been approved

## 2016-11-12 NOTE — Telephone Encounter (Signed)
Refill of  Canagliflozin-Metformin HCl (INVOKAMET) [phone] MG TABS 60 tablet   [id number] - [place], [place] - [address] AT [facility] OF [address] [phone] (Phone) [phone] (Fax)

## 2016-11-12 NOTE — Telephone Encounter (Signed)
Ordered again on 11/12/16

## 2016-11-13 NOTE — Telephone Encounter (Signed)
PA was approved. 

## 2016-11-13 NOTE — Telephone Encounter (Signed)
Patient husband stated pharmacy said PA wasn't approved.  Please advise

## 2016-11-13 NOTE — Telephone Encounter (Signed)
Resent paperwork for the invokamet to insurance per pt request

## 2016-12-01 ENCOUNTER — Other Ambulatory Visit: Payer: Self-pay | Admitting: Endocrinology

## 2016-12-03 ENCOUNTER — Other Ambulatory Visit: Payer: Self-pay | Admitting: Endocrinology

## 2016-12-07 ENCOUNTER — Other Ambulatory Visit: Payer: Self-pay | Admitting: Endocrinology

## 2016-12-20 ENCOUNTER — Other Ambulatory Visit (INDEPENDENT_AMBULATORY_CARE_PROVIDER_SITE_OTHER): Payer: Federal, State, Local not specified - PPO

## 2016-12-20 DIAGNOSIS — Z794 Long term (current) use of insulin: Secondary | ICD-10-CM | POA: Diagnosis not present

## 2016-12-20 DIAGNOSIS — E1165 Type 2 diabetes mellitus with hyperglycemia: Secondary | ICD-10-CM

## 2016-12-20 LAB — COMPREHENSIVE METABOLIC PANEL
ALT: 34 U/L (ref 0–35)
AST: 24 U/L (ref 0–37)
Albumin: 4.2 g/dL (ref 3.5–5.2)
Alkaline Phosphatase: 100 U/L (ref 39–117)
BUN: 18 mg/dL (ref 6–23)
CO2: 30 mEq/L (ref 19–32)
Calcium: 9.8 mg/dL (ref 8.4–10.5)
Chloride: 106 mEq/L (ref 96–112)
Creatinine, Ser: 0.69 mg/dL (ref 0.40–1.20)
GFR: 90.9 mL/min (ref >60.00)
Glucose, Bld: 72 mg/dL (ref 70–99)
Potassium: 3.6 mEq/L (ref 3.5–5.1)
Sodium: 144 mEq/L (ref 135–145)
Total Bilirubin: 0.3 mg/dL (ref 0.2–1.2)
Total Protein: 8.2 g/dL (ref 6.0–8.3)

## 2016-12-20 LAB — LIPID PANEL
Cholesterol: 230 mg/dL — ABNORMAL HIGH (ref 0–200)
HDL: 52.3 mg/dL (ref >39.00)
LDL Cholesterol: 153 mg/dL — ABNORMAL HIGH (ref 0–99)
NonHDL: 177.91
Total CHOL/HDL Ratio: 4
Triglycerides: 127 mg/dL (ref 0.0–149.0)
VLDL: 25.4 mg/dL (ref 0.0–40.0)

## 2016-12-20 LAB — HEMOGLOBIN A1C: Hgb A1c MFr Bld: 7.5 % — ABNORMAL HIGH (ref 4.6–6.5)

## 2016-12-22 ENCOUNTER — Other Ambulatory Visit: Payer: Self-pay | Admitting: Endocrinology

## 2016-12-26 ENCOUNTER — Ambulatory Visit (INDEPENDENT_AMBULATORY_CARE_PROVIDER_SITE_OTHER): Payer: Federal, State, Local not specified - PPO | Admitting: Endocrinology

## 2016-12-26 ENCOUNTER — Encounter: Payer: Self-pay | Admitting: Endocrinology

## 2016-12-26 VITALS — BP 142/90 | HR 81 | Ht 64.0 in | Wt 170.0 lb

## 2016-12-26 DIAGNOSIS — E1165 Type 2 diabetes mellitus with hyperglycemia: Secondary | ICD-10-CM | POA: Diagnosis not present

## 2016-12-26 DIAGNOSIS — Z794 Long term (current) use of insulin: Secondary | ICD-10-CM

## 2016-12-26 NOTE — Progress Notes (Signed)
Patient ID: Linda Lowe, female   DOB: 09/26/1950, 66 y.o.   MRN: [id number]   Reason for Appointment: Diabetes follow-up   History of Present Illness   Diagnosis: date of diagnosis: ? 2009   Current insulin regimen:       TOUJEO 42 UNITS IN AM ONCE DAILY and Novolog 16 at lunch and 16 at dinner  Non-insulin hypoglycemic drugs: Victoza 1.8 mg daily at lunch.  Invokamet 150/1000 twice a day  A1c has been over 8% consistently but now is down to 7.5  Current blood sugar patterns and problems identified:   She did bring her monitor and now is using the One Touch Verio which she finds easier to use  She is only checking blood sugars before meals and only occasionally after meals  Her blood sugars are fluctuating somewhat in the mornings although she also has had some unusually high readings near 300 in the evenings also  Usually her blood sugars fluctuate based on her carbohydrate intake such as rice  Has rare hypoglycemia as below  Hypoglycemia: Has 2 low readings yesterday and once last month fasting   Glucometer: Verio.  Blood Glucose readings are being checked mostly before breakfast and supper now  Readings by download:  Mean values apply above for all meters except median for One Touch  PRE-MEAL Fasting Lunch Dinner Bedtime Overall  Glucose range: 51-2 05   57-292     Mean/median: 108   116   110     DIET: Usually small portions, at times has more carbohydrate  Meals: 2 meals per day usually at 7 a.m. and 6 p.m.   Physical activity: exercise: walking/bike, 20 min, 0-1 days a week;    Certified Diabetes Educator visit: Most recent:, 2015.  Dietician visit: Most recent: 06/2016     Wt Readings from Last 3 Encounters:  12/26/16 170 lb (77.1 kg)  09/27/16 164 lb (74.4 kg)  09/16/16 174 lb (78.9 kg)   LABS:  Lab Results  Component Value Date   HGBA1C 7.5 (H) 12/20/2016   HGBA1C 8.4 (H) 07/18/2016   HGBA1C 8.5 (H) 02/20/2016   Lab Results    Component Value Date   MICROALBUR 1.3 09/24/2016   LDLCALC 153 (H) 12/20/2016   CREATININE 0.69 12/20/2016     PAST history: Her diabetes has been usually poorly controlled. She has been on multiple medications including Victoza and basal bolus regimen also. However she  had difficulty with consistent compliance with insulin, diet and exercise regimen. With starting Victoza her blood sugars appeared to be improved. After repeated sessions with educators she was able to improve her compliance level and blood sugars had been improving since about 10/13. Insulin doses have been increased overall  A1c previously had been usually over 8% and below this for the first time in 2/14. She had been switched to a combination of Invokana and metformin instead of Janumet in late 2014 In 1/15 her blood sugars are excellent with A1c 7%      Lab on 12/20/2016  Component Date Value Ref Range Status  . Hgb A1c MFr Bld 12/20/2016 7.5* 4.6 - 6.5 % Final  . Sodium 12/20/2016 144  135 - 145 mEq/L Final  . Potassium 12/20/2016 3.6  3.5 - 5.1 mEq/L Final  . Chloride 12/20/2016 106  96 - 112 mEq/L Final  . CO2 12/20/2016 30  19 - 32 mEq/L Final  . Glucose, Bld 12/20/2016 72  70 - 99 mg/dL Final  .  BUN 12/20/2016 18  6 - 23 mg/dL Final  . Creatinine, Ser 12/20/2016 0.69  0.40 - 1.20 mg/dL Final  . Total Bilirubin 12/20/2016 0.3  0.2 - 1.2 mg/dL Final  . Alkaline Phosphatase 12/20/2016 100  39 - 117 U/L Final  . AST 12/20/2016 24  0 - 37 U/L Final  . ALT 12/20/2016 34  0 - 35 U/L Final  . Total Protein 12/20/2016 8.2  6.0 - 8.3 g/dL Final  . Albumin 82/80/0349 4.2  3.5 - 5.2 g/dL Final  . Calcium 17/91/5056 9.8  8.4 - 10.5 mg/dL Final  . GFR 97/94/8016 90.90  >60.00 mL/min Final  . Cholesterol 12/20/2016 230* 0 - 200 mg/dL Final  . Triglycerides 12/20/2016 127.0  0.0 - 149.0 mg/dL Final  . HDL 55/37/4827 52.30  >39.00 mg/dL Final  . VLDL 07/86/7544 25.4  0.0 - 40.0 mg/dL Final  . LDL Cholesterol  12/20/2016 153* 0 - 99 mg/dL Final  . Total CHOL/HDL Ratio 12/20/2016 4   Final  . NonHDL 12/20/2016 177.91   Final    Allergies as of 12/26/2016   No Known Allergies     Medication List       Accurate as of 12/26/16  9:12 PM. Always use your most recent med list.          amLODipine 5 MG tablet Commonly known as:  NORVASC TK 1 T PO QD   atorvastatin 40 MG tablet Commonly known as:  LIPITOR TAKE 1 TABLET(40 MG) BY MOUTH DAILY   buPROPion 300 MG 24 hr tablet Commonly known as:  WELLBUTRIN XL Take 300 mg by mouth daily.   Canagliflozin-Metformin HCl [phone] MG Tabs Commonly known as:  INVOKAMET Take 1 tablet by mouth 2 (two) times daily.   carvedilol 25 MG tablet Commonly known as:  COREG Take 25 mg by mouth 2 (two) times daily.   EPIPEN 0.3 mg/0.3 mL Devi Generic drug:  EPINEPHrine Inject 0.3 mg into the muscle as needed (for allergic reaction). For allergic reactions   furosemide 20 MG tablet Commonly known as:  LASIX Take 20 mg by mouth daily.   glucose blood test strip Commonly known as:  ONETOUCH VERIO Use to test blood sugar 3 times daily   Insulin Pen Needle 31G X 5 MM Misc Commonly known as:  B-D UF III MINI PEN NEEDLES USE 5 PEN NEEDLES PER DAY AS INSTRUCTED   B-D UF III MINI PEN NEEDLES 31G X 5 MM Misc Generic drug:  Insulin Pen Needle USE 5 NEEDLES EVERY DAY AS DIRECTED   irbesartan 300 MG tablet Commonly known as:  AVAPRO Take 300 mg by mouth daily.   naproxen 500 MG tablet Commonly known as:  NAPROSYN Take 500 mg by mouth 2 (two) times daily with a meal.   NOVOLOG FLEXPEN 100 UNIT/ML FlexPen Generic drug:  insulin aspart ADMINISTER 16 UNITS UNDER THE SKIN TWICE DAILY BEFORE LUNCH AND SUPPER   NOVOLOG FLEXPEN 100 UNIT/ML FlexPen Generic drug:  insulin aspart ADMINISTER 16 UNITS UNDER THE SKIN TWICE DAILY BEFORE LUNCH AND SUPPER   ONETOUCH DELICA LANCETS FINE Misc USE TO TEST BLOOD SUGAR 3 TIMES DAILY   oxybutynin 5 MG 24 hr  tablet Commonly known as:  DITROPAN-XL Take 5 mg by mouth daily.   potassium chloride SA 20 MEQ tablet Commonly known as:  K-DUR,KLOR-CON TAKE 1 TABLET(20 MEQ) BY MOUTH DAILY   TOUJEO SOLOSTAR 300 UNIT/ML Sopn Generic drug:  Insulin Glargine INJECT 40 UNITS INTO THE SKIN DAILY  VICTOZA 18 MG/3ML Sopn Generic drug:  liraglutide INJECT 1.8 MG UNDER THE SKIN DAILY       Allergies: No Known Allergies  Past Medical History:  Diagnosis Date  . Arthritis   . Cardiomyopathy (HCC)    a. 05/2012 Echo: EF 30-35%, Gr 1 DD, mild LVH, mild MR, pericardial effusion  . Diabetes mellitus    a. Dx > 5 y ago  . Hypercholesteremia   . Hypertension   . Pericardial effusion    a. 05/2012 Echo: circumferential pericardial effusion, mostly 72mm, largest pocket posterior - 24mm, mild RA indentation, no RV collapse, no tamponade.    Past Surgical History:  Procedure Laterality Date  . ABDOMINAL HYSTERECTOMY    . BI-VENTRICULAR IMPLANTABLE CARDIOVERTER DEFIBRILLATOR N/A 12/01/2013   Procedure: BI-VENTRICULAR IMPLANTABLE CARDIOVERTER DEFIBRILLATOR  (CRT-D);  Surgeon: Duke Salvia, MD;  Location: [facility] CATH LAB;  Service: Cardiovascular;  Laterality: N/A;  . FOOT SURGERY    . LEFT AND RIGHT HEART CATHETERIZATION WITH CORONARY ANGIOGRAM  05/26/2012   Procedure: LEFT AND RIGHT HEART CATHETERIZATION WITH CORONARY ANGIOGRAM;  Surgeon: Tonny Bollman, MD;  Location: [facility] CATH LAB;  Service: Cardiovascular;;    Family History  Problem Relation Age of Onset  . Other      No history of CAD  . Tuberculosis Father     died when pt was young.    Social History:  reports that she has quit smoking. She has never used smokeless tobacco. She reports that she does not drink alcohol or use drugs.  Review of Systems   Lipids: LDL usually controlled, however LDL is high despite being prescribed Lipitor 40 mg   Lab Results  Component Value Date   CHOL 230 (H) 12/20/2016   HDL 52.30 12/20/2016   LDLCALC 153  (H) 12/20/2016   LDLDIRECT 141.0 06/09/2013   TRIG 127.0 12/20/2016   CHOLHDL 4 12/20/2016    Hypertension: Has been on multiple medications, she follows with PCP and cardiologist, Blood pressure Improved recently with weight loss  Taking 20mg  Lasix because of history of LV dysfunction, followed by cardiologist   Diabetic foot exam was normal in 6/17    Date of last eye exam 8/17, does not know the name of the ophthalmologist   Examination:   BP (!) 142/90   Pulse 81   Ht 5\' 4"  (1.626 m)   Wt 170 lb (77.1 kg)   BMI 29.18 kg/m   Body mass index is 29.18 kg/m.   Assesment:   Diabetes type 2, uncontrolled:   See history of present illness for detailed discussion of his current management, blood sugar patterns and problems identified Currently in a regimen of basal bolus insulin, Victoza and Invokamet  Her A1c has been over 8% consistently but now down to 7.5  She has had relatively lower sugars at times before breakfast and supper but need to know more about her sugars after meals to adjust her mealtime doses  She will reduce her Toujeo known to 40 units Check more readings after meals  LIPIDS: She will check to see if she is taking her Lipitor regularly as LDL is higher than usual   Patient Instructions  Toujeo 40 units daily  Check blood sugars on waking up  Every 2 days  Also check blood sugars about 2 hours after a meal and do this after different meals by rotation  Recommended blood sugar levels on waking up is 90-130 and about 2 hours after meal is 130-160  Please bring  your blood sugar monitor to each visit, thank you     [facility] 12/26/2016, 9:12 PM     Note: This office note was prepared with Dragon voice recognition system technology. Any transcriptional errors that result from this process are unintentional.

## 2016-12-26 NOTE — Patient Instructions (Signed)
Toujeo 40 units daily  Check blood sugars on waking up  Every 2 days  Also check blood sugars about 2 hours after a meal and do this after different meals by rotation  Recommended blood sugar levels on waking up is 90-130 and about 2 hours after meal is 130-160  Please bring your blood sugar monitor to each visit, thank you

## 2016-12-30 ENCOUNTER — Other Ambulatory Visit: Payer: Self-pay | Admitting: Endocrinology

## 2017-02-12 ENCOUNTER — Other Ambulatory Visit: Payer: Self-pay | Admitting: Endocrinology

## 2017-02-19 ENCOUNTER — Other Ambulatory Visit: Payer: Self-pay | Admitting: Endocrinology

## 2017-03-09 ENCOUNTER — Other Ambulatory Visit: Payer: Self-pay | Admitting: Endocrinology

## 2017-03-13 ENCOUNTER — Other Ambulatory Visit: Payer: Self-pay | Admitting: Endocrinology

## 2017-03-14 ENCOUNTER — Other Ambulatory Visit: Payer: Self-pay | Admitting: Endocrinology

## 2017-03-22 ENCOUNTER — Observation Stay (HOSPITAL_COMMUNITY): Payer: Federal, State, Local not specified - PPO

## 2017-03-22 ENCOUNTER — Emergency Department (HOSPITAL_COMMUNITY): Payer: Federal, State, Local not specified - PPO

## 2017-03-22 ENCOUNTER — Encounter (HOSPITAL_COMMUNITY): Payer: Self-pay | Admitting: Emergency Medicine

## 2017-03-22 ENCOUNTER — Inpatient Hospital Stay (HOSPITAL_COMMUNITY)
Admission: EM | Admit: 2017-03-22 | Discharge: 2017-03-26 | DRG: 872 | Disposition: A | Discharge status: Home / Self Care | Payer: Federal, State, Local not specified - PPO | Attending: Internal Medicine | Admitting: Internal Medicine

## 2017-03-22 DIAGNOSIS — I10 Essential (primary) hypertension: Secondary | ICD-10-CM | POA: Diagnosis present

## 2017-03-22 DIAGNOSIS — Z794 Long term (current) use of insulin: Secondary | ICD-10-CM

## 2017-03-22 DIAGNOSIS — E860 Dehydration: Secondary | ICD-10-CM

## 2017-03-22 DIAGNOSIS — Z90710 Acquired absence of both cervix and uterus: Secondary | ICD-10-CM

## 2017-03-22 DIAGNOSIS — I472 Ventricular tachycardia: Secondary | ICD-10-CM | POA: Diagnosis present

## 2017-03-22 DIAGNOSIS — R6510 Systemic inflammatory response syndrome (SIRS) of non-infectious origin without acute organ dysfunction: Secondary | ICD-10-CM | POA: Diagnosis present

## 2017-03-22 DIAGNOSIS — R7989 Other specified abnormal findings of blood chemistry: Secondary | ICD-10-CM | POA: Diagnosis present

## 2017-03-22 DIAGNOSIS — I5022 Chronic systolic (congestive) heart failure: Secondary | ICD-10-CM | POA: Diagnosis not present

## 2017-03-22 DIAGNOSIS — I5042 Chronic combined systolic (congestive) and diastolic (congestive) heart failure: Secondary | ICD-10-CM | POA: Diagnosis present

## 2017-03-22 DIAGNOSIS — R404 Transient alteration of awareness: Secondary | ICD-10-CM | POA: Diagnosis not present

## 2017-03-22 DIAGNOSIS — R748 Abnormal levels of other serum enzymes: Secondary | ICD-10-CM | POA: Diagnosis not present

## 2017-03-22 DIAGNOSIS — I429 Cardiomyopathy, unspecified: Secondary | ICD-10-CM | POA: Diagnosis not present

## 2017-03-22 DIAGNOSIS — E1165 Type 2 diabetes mellitus with hyperglycemia: Secondary | ICD-10-CM | POA: Diagnosis present

## 2017-03-22 DIAGNOSIS — I110 Hypertensive heart disease with heart failure: Secondary | ICD-10-CM | POA: Diagnosis present

## 2017-03-22 DIAGNOSIS — Z79899 Other long term (current) drug therapy: Secondary | ICD-10-CM

## 2017-03-22 DIAGNOSIS — R778 Other specified abnormalities of plasma proteins: Secondary | ICD-10-CM | POA: Diagnosis present

## 2017-03-22 DIAGNOSIS — B9620 Unspecified Escherichia coli [E. coli] as the cause of diseases classified elsewhere: Secondary | ICD-10-CM | POA: Diagnosis present

## 2017-03-22 DIAGNOSIS — E7800 Pure hypercholesterolemia, unspecified: Secondary | ICD-10-CM | POA: Diagnosis present

## 2017-03-22 DIAGNOSIS — A419 Sepsis, unspecified organism: Secondary | ICD-10-CM | POA: Diagnosis not present

## 2017-03-22 DIAGNOSIS — R739 Hyperglycemia, unspecified: Secondary | ICD-10-CM

## 2017-03-22 DIAGNOSIS — E872 Acidosis: Secondary | ICD-10-CM | POA: Diagnosis not present

## 2017-03-22 DIAGNOSIS — N390 Urinary tract infection, site not specified: Secondary | ICD-10-CM

## 2017-03-22 DIAGNOSIS — R531 Weakness: Secondary | ICD-10-CM | POA: Diagnosis not present

## 2017-03-22 DIAGNOSIS — E871 Hypo-osmolality and hyponatremia: Secondary | ICD-10-CM | POA: Diagnosis not present

## 2017-03-22 DIAGNOSIS — B9689 Other specified bacterial agents as the cause of diseases classified elsewhere: Secondary | ICD-10-CM | POA: Diagnosis present

## 2017-03-22 DIAGNOSIS — R197 Diarrhea, unspecified: Secondary | ICD-10-CM | POA: Diagnosis present

## 2017-03-22 LAB — CBC
HCT: 38.4 % (ref 36.0–46.0)
Hemoglobin: 12.9 g/dL (ref 12.0–15.0)
MCH: 28.9 pg (ref 26.0–34.0)
MCHC: 33.6 g/dL (ref 30.0–36.0)
MCV: 86.1 fL (ref 78.0–100.0)
Platelets: 168 10*3/uL (ref 150–400)
RBC: 4.46 MIL/uL (ref 3.87–5.11)
RDW: 13.2 % (ref 11.5–15.5)
WBC: 14.8 10*3/uL — ABNORMAL HIGH (ref 4.0–10.5)

## 2017-03-22 LAB — URINALYSIS, ROUTINE W REFLEX MICROSCOPIC
Bacteria, UA: NONE SEEN
Bilirubin Urine: NEGATIVE
Glucose, UA: >=500 mg/dL — AB
Ketones, ur: 20 mg/dL — AB
Nitrite: POSITIVE — AB
Protein, ur: NEGATIVE mg/dL
Specific Gravity, Urine: 1.027 (ref 1.005–1.030)
pH: 6 (ref 5.0–8.0)

## 2017-03-22 LAB — ECHOCARDIOGRAM COMPLETE
Ao-asc: 32 cm
Area-P 1/2: 2.93 cm2
E decel time: 278 msec
E/e' ratio: 9.09
FS: 25 % — AB (ref 28–44)
Height: 63 in
IVS/LV PW RATIO, ED: 0.93
LA ID, A-P, ES: 40 mm
LA diam end sys: 40 mm
LA diam index: 2.2 cm/m2
LA vol A4C: 48.2 ml
LA vol index: 29.1 mL/m2
LA vol: 52.9 mL
LV E/e' medial: 9.09
LV E/e'average: 9.09
LV PW d: 14.7 mm — AB (ref 0.6–1.1)
LV dias vol index: 47 mL/m2
LV dias vol: 86 mL (ref 46–106)
LV e' LATERAL: 8.16 cm/s
LV sys vol index: 15 mL/m2
LV sys vol: 27 mL (ref 14–42)
LVOT area: 2.54 cm2
LVOT diameter: 18 mm
Lateral S' vel: 13.2 cm/s
MV Dec: 278
MV Peak grad: 2 mmHg
MV pk A vel: 96 m/s
MV pk E vel: 74.2 m/s
P 1/2 time: 81 ms
Simpson's disk: 69
Stroke v: 59 ml
TAPSE: 24.8 mm
TDI e' lateral: 8.16
TDI e' medial: 4.79
Weight: 2563.2 oz

## 2017-03-22 LAB — BASIC METABOLIC PANEL
Anion gap: 12 (ref 5–15)
BUN: 16 mg/dL (ref 6–20)
CO2: 18 mmol/L — ABNORMAL LOW (ref 22–32)
Calcium: 8.7 mg/dL — ABNORMAL LOW (ref 8.9–10.3)
Chloride: 97 mmol/L — ABNORMAL LOW (ref 101–111)
Creatinine, Ser: 0.88 mg/dL (ref 0.44–1.00)
GFR calc Af Amer: >60 mL/min (ref >60)
GFR calc non Af Amer: >60 mL/min (ref >60)
Glucose, Bld: 334 mg/dL — ABNORMAL HIGH (ref 65–99)
Potassium: 4.2 mmol/L (ref 3.5–5.1)
Sodium: 127 mmol/L — ABNORMAL LOW (ref 135–145)

## 2017-03-22 LAB — TROPONIN I
Troponin I: 0.06 ng/mL (ref <0.03)
Troponin I: 0.07 ng/mL (ref <0.03)

## 2017-03-22 LAB — GLUCOSE, CAPILLARY
Glucose-Capillary: 252 mg/dL — ABNORMAL HIGH (ref 65–99)
Glucose-Capillary: 202 mg/dL — ABNORMAL HIGH (ref 65–99)
Glucose-Capillary: 160 mg/dL — ABNORMAL HIGH (ref 65–99)
Glucose-Capillary: 217 mg/dL — ABNORMAL HIGH (ref 65–99)

## 2017-03-22 LAB — LIPASE, BLOOD: Lipase: 29 U/L (ref 11–51)

## 2017-03-22 LAB — TSH: TSH: 1.277 u[IU]/mL (ref 0.350–4.500)

## 2017-03-22 LAB — HEPATIC FUNCTION PANEL
ALT: 27 U/L (ref 14–54)
AST: 34 U/L (ref 15–41)
Albumin: 3.3 g/dL — ABNORMAL LOW (ref 3.5–5.0)
Alkaline Phosphatase: 84 U/L (ref 38–126)
Bilirubin, Direct: 0.3 mg/dL (ref 0.1–0.5)
Indirect Bilirubin: 1.2 mg/dL — ABNORMAL HIGH (ref 0.3–0.9)
Total Bilirubin: 1.5 mg/dL — ABNORMAL HIGH (ref 0.3–1.2)
Total Protein: 7.5 g/dL (ref 6.5–8.1)

## 2017-03-22 LAB — BRAIN NATRIURETIC PEPTIDE: B Natriuretic Peptide: 101.5 pg/mL — ABNORMAL HIGH (ref 0.0–100.0)

## 2017-03-22 LAB — I-STAT CG4 LACTIC ACID, ED: Lactic Acid, Venous: 1.63 mmol/L (ref 0.5–1.9)

## 2017-03-22 LAB — HIV ANTIBODY (ROUTINE TESTING W REFLEX): HIV Screen 4th Generation wRfx: NONREACTIVE

## 2017-03-22 LAB — SODIUM, URINE, RANDOM: Sodium, Ur: 13 mmol/L

## 2017-03-22 LAB — CBG MONITORING, ED
Glucose-Capillary: 251 mg/dL — ABNORMAL HIGH (ref 65–99)
Glucose-Capillary: 327 mg/dL — ABNORMAL HIGH (ref 65–99)

## 2017-03-22 LAB — OSMOLALITY: Osmolality: 286 mOsm/kg (ref 275–295)

## 2017-03-22 MED ORDER — INSULIN GLARGINE 100 UNIT/ML ~~LOC~~ SOLN
15.0000 [IU] | Freq: Every day | SUBCUTANEOUS | Status: DC
  Administered 2017-03-22 – 2017-03-25 (×4): 15 [IU] via SUBCUTANEOUS
  Filled 2017-03-22 (×4): qty 0.15

## 2017-03-22 MED ORDER — SODIUM CHLORIDE 0.9 % IV SOLN
Freq: Once | INTRAVENOUS | Status: AC
  Administered 2017-03-22: 03:00:00 via INTRAVENOUS

## 2017-03-22 MED ORDER — ACETAMINOPHEN 325 MG PO TABS
650.0000 mg | ORAL_TABLET | Freq: Four times a day (QID) | ORAL | Status: DC | PRN
  Administered 2017-03-22 (×2): 650 mg via ORAL
  Filled 2017-03-22 (×2): qty 2

## 2017-03-22 MED ORDER — ONDANSETRON HCL 4 MG/2ML IJ SOLN: 4.0000 mg | Freq: Four times a day (QID) | INTRAMUSCULAR | Status: DC | PRN

## 2017-03-22 MED ORDER — DEXTROSE 5 % IV SOLN
2.0000 g | INTRAVENOUS | Status: DC
  Administered 2017-03-22 – 2017-03-23 (×2): 2 g via INTRAVENOUS
  Filled 2017-03-22 (×2): qty 2

## 2017-03-22 MED ORDER — METRONIDAZOLE IN NACL 5-0.79 MG/ML-% IV SOLN
500.0000 mg | Freq: Three times a day (TID) | INTRAVENOUS | Status: DC
Start: 2017-03-22 — End: 2017-03-24
  Administered 2017-03-22 – 2017-03-24 (×8): 500 mg via INTRAVENOUS
  Filled 2017-03-22 (×7): qty 100

## 2017-03-22 MED ORDER — ENOXAPARIN SODIUM 40 MG/0.4ML ~~LOC~~ SOLN
40.0000 mg | SUBCUTANEOUS | Status: DC
  Administered 2017-03-22 – 2017-03-26 (×5): 40 mg via SUBCUTANEOUS
  Filled 2017-03-22 (×4): qty 0.4

## 2017-03-22 MED ORDER — DEXTROSE 5 % IV SOLN: 1.0000 g | Freq: Once | INTRAVENOUS | Status: DC

## 2017-03-22 MED ORDER — INSULIN ASPART 100 UNIT/ML ~~LOC~~ SOLN
0.0000 [IU] | Freq: Three times a day (TID) | SUBCUTANEOUS | Status: DC
  Administered 2017-03-22: 5 [IU] via SUBCUTANEOUS
  Administered 2017-03-22: 8 [IU] via SUBCUTANEOUS
  Administered 2017-03-22 – 2017-03-23 (×2): 3 [IU] via SUBCUTANEOUS
  Administered 2017-03-23: 8 [IU] via SUBCUTANEOUS
  Administered 2017-03-23 – 2017-03-25 (×6): 5 [IU] via SUBCUTANEOUS
  Administered 2017-03-25: 3 [IU] via SUBCUTANEOUS
  Administered 2017-03-26: 5 [IU] via SUBCUTANEOUS

## 2017-03-22 MED ORDER — SODIUM CHLORIDE 0.9 % IV BOLUS (SEPSIS)
500.0000 mL | Freq: Once | INTRAVENOUS | Status: AC
  Administered 2017-03-22: 500 mL via INTRAVENOUS

## 2017-03-22 MED ORDER — SODIUM CHLORIDE 0.9 % IV SOLN
INTRAVENOUS | Status: AC
  Administered 2017-03-22: 05:00:00 via INTRAVENOUS

## 2017-03-22 MED ORDER — CARVEDILOL 25 MG PO TABS
25.0000 mg | ORAL_TABLET | Freq: Two times a day (BID) | ORAL | Status: DC
  Administered 2017-03-22 – 2017-03-26 (×9): 25 mg via ORAL
  Filled 2017-03-22 (×9): qty 1

## 2017-03-22 MED ORDER — ONDANSETRON HCL 4 MG PO TABS: 4.0000 mg | ORAL_TABLET | Freq: Four times a day (QID) | ORAL | Status: DC | PRN

## 2017-03-22 MED ORDER — INSULIN ASPART 100 UNIT/ML ~~LOC~~ SOLN
0.0000 [IU] | Freq: Every day | SUBCUTANEOUS | Status: DC
  Administered 2017-03-22 – 2017-03-25 (×3): 2 [IU] via SUBCUTANEOUS

## 2017-03-22 NOTE — ED Provider Notes (Signed)
[facility]-EMERGENCY DEPT Provider Note   CSN: [id number] Arrival date & time: 03/22/17  4270   By signing my name below, I, [name], attest that this documentation has been prepared under the direction and in the presence of [name], Jeannett Senior, MD. Electronically signed, [name], ED Scribe. 03/22/17. 1:22 AM.   History   Chief Complaint Chief Complaint  Patient presents with  . Weakness  . Hyperglycemia   The history is provided by the patient and medical records. No language interpreter was used.   Linda Lowe is a 66 y.o. female BIB EMS to the Emergency Department concerning L sided chest pain onset PTA. Pt's chest pain has subsided on evaluation. Associated fatigue, diarrhea, diaphoresis, decreased appetite, high blood sugars (CBG 327 on arrival) x 1 week. Confusion reported x 2 days; 1 episode of dizziness reported PTA. Pt states she has had diarrhea x 3 daily x 1 week. She states she has been unable to eat solid food, but she has drank orange juice. Pt allegedly last ate a few spoonfuls of fried rice yesterday. Husband states pt has taken tylenol at home with no relief. Pt has allegedly been sick in bed x 1 week; she states she has not taken her insulin in 2-3 days because she has not eaten. H/o pacemaker placement and heart failure noted. No h/o stent placement. Pt denies blood thinner use. No known medication allergies. No vomiting, fever, bloody stool, decreased appetite, abdominal pain, headache, recent abx use or international travel. No other complaints at this time.   Past Medical History:  Diagnosis Date  . Arthritis   . Cardiomyopathy (HCC)    a. 05/2012 Echo: EF 30-35%, Gr 1 DD, mild LVH, mild MR, pericardial effusion  . Diabetes mellitus    a. Dx > 5 y ago  . Hypercholesteremia   . Hypertension   . Pericardial effusion    a. 05/2012 Echo: circumferential pericardial effusion, mostly 64mm, largest pocket posterior - 29mm, mild RA indentation, no RV collapse, no  tamponade.    Patient Active Problem List   Diagnosis Date Noted  . Uncontrolled type 2 diabetes mellitus with hyperglycemia, with long-term current use of insulin (HCC) 09/28/2016  . Nonischemic cardiomyopathy (HCC) 08/26/2013  . Chronic systolic heart failure (HCC) 08/26/2013  . LBBB (left bundle branch block) 08/26/2013  . Hyperlipidemia 06/23/2013  . HTN (hypertension) 05/23/2012    Past Surgical History:  Procedure Laterality Date  . ABDOMINAL HYSTERECTOMY    . BI-VENTRICULAR IMPLANTABLE CARDIOVERTER DEFIBRILLATOR N/A 12/01/2013   Procedure: BI-VENTRICULAR IMPLANTABLE CARDIOVERTER DEFIBRILLATOR  (CRT-D);  Surgeon: Duke Salvia, MD;  Location: [facility] CATH LAB;  Service: Cardiovascular;  Laterality: N/A;  . FOOT SURGERY    . LEFT AND RIGHT HEART CATHETERIZATION WITH CORONARY ANGIOGRAM  05/26/2012   Procedure: LEFT AND RIGHT HEART CATHETERIZATION WITH CORONARY ANGIOGRAM;  Surgeon: Tonny Bollman, MD;  Location: [facility] CATH LAB;  Service: Cardiovascular;;    OB History    No data available       Home Medications    Prior to Admission medications   Medication Sig Start Date End Date Taking? Authorizing Provider  amLODipine (NORVASC) 5 MG tablet TK 1 T PO QD 09/16/16   [provider]  atorvastatin (LIPITOR) 40 MG tablet TAKE 1 TABLET(40 MG) BY MOUTH DAILY 12/07/16   Reather Littler, MD  B-D UF III MINI PEN NEEDLES 31G X 5 MM MISC USE 5 NEEDLES EVERY DAY AS DIRECTED 03/13/17   Reather Littler, MD  buPROPion (WELLBUTRIN XL)  300 MG 24 hr tablet Take 300 mg by mouth daily.    [provider]  Canagliflozin-Metformin HCl (INVOKAMET) [phone] MG TABS Take 1 tablet by mouth 2 (two) times daily. 11/12/16   Reather Littler, MD  carvedilol (COREG) 25 MG tablet Take 25 mg by mouth 2 (two) times daily. 07/15/15   [provider]  EPINEPHrine (EPIPEN) 0.3 mg/0.3 mL DEVI Inject 0.3 mg into the muscle as needed (for allergic reaction). For allergic reactions     [provider]    furosemide (LASIX) 20 MG tablet Take 20 mg by mouth daily.    [provider]  glucose blood (ONETOUCH VERIO) test strip Use to test blood sugar 3 times daily 10/04/16   Reather Littler, MD  Insulin Pen Needle (B-D UF III MINI PEN NEEDLES) 31G X 5 MM MISC USE 5 PEN NEEDLES PER DAY AS INSTRUCTED 10/14/16   Reather Littler, MD  INVOKAMET [phone] MG TABS TAKE 1 TABLET BY MOUTH TWICE DAILY 03/14/17   Reather Littler, MD  irbesartan (AVAPRO) 300 MG tablet Take 300 mg by mouth daily.    [provider]  naproxen (NAPROSYN) 500 MG tablet Take 500 mg by mouth 2 (two) times daily with a meal.    [provider]  NOVOLOG FLEXPEN 100 UNIT/ML FlexPen ADMINISTER 16 UNITS UNDER THE SKIN TWICE DAILY BEFORE LUNCH AND SUPPER 12/03/16   Reather Littler, MD  NOVOLOG FLEXPEN 100 UNIT/ML FlexPen ADMINISTER 16 UNITS UNDER THE SKIN TWICE DAILY BEFORE LUNCH AND SUPPER 02/19/17   Reather Littler, MD  [facility] DELICA LANCETS FINE MISC USE TO TEST BLOOD SUGAR 3 TIMES DAILY 10/28/16   Reather Littler, MD  oxybutynin (DITROPAN-XL) 5 MG 24 hr tablet Take 5 mg by mouth daily.    [provider]  potassium chloride SA (K-DUR,KLOR-CON) 20 MEQ tablet TAKE 1 TABLET(20 MEQ) BY MOUTH DAILY 12/01/16   Reather Littler, MD  TOUJEO SOLOSTAR 300 UNIT/ML SOPN INJECT 40 UNITS INTO THE SKIN DAILY 12/30/16   Reather Littler, MD  VICTOZA 18 MG/3ML SOPN INJECT 1.8 MG UNDER THE SKIN DAILY 03/09/17   Reather Littler, MD    Family History Family History  Problem Relation Age of Onset  . Other Unknown        No history of CAD  . Tuberculosis Father        died when pt was young.    Social History Social History  Substance Use Topics  . Smoking status: Former Games developer  . Smokeless tobacco: Never Used     Comment: Quit "long time ago."  . Alcohol use No     Allergies   Patient has no known allergies.   Review of Systems Review of Systems All other systems reviewed and all systems are negative for acute changes except as noted in the HPI  and PMH.    Physical Exam Updated Vital Signs BP (!) 145/85   Pulse 79   Resp 18   Ht 5' 3.5" (1.613 m)   Wt 160 lb (72.6 kg)   SpO2 97%   BMI 27.90 kg/m   Physical Exam  Constitutional: She is oriented to person, place, and time. She appears well-developed and well-nourished. No distress.  Ill appearing  HENT:  Head: Normocephalic and atraumatic.  Mouth/Throat: Oropharynx is clear and moist. No oropharyngeal exudate.  Eyes: Conjunctivae and EOM are normal. Pupils are equal, round, and reactive to light.  Neck: Normal range of motion. Neck supple.  No meningismus.  Cardiovascular: Normal rate, regular  rhythm, normal heart sounds and intact distal pulses.   No murmur heard. AICD L chest  Pulmonary/Chest: Effort normal and breath sounds normal. No respiratory distress.  Lungs clear  Abdominal: Soft. There is no tenderness. There is no rebound and no guarding.  Musculoskeletal: Normal range of motion. She exhibits no edema or tenderness.  Neurological: She is alert and oriented to person, place, and time. No cranial nerve deficit. She exhibits normal muscle tone. Coordination normal.   5/5 strength throughout. CN 2-12 intact.Equal grip strength.   Skin: Skin is warm.  Psychiatric: She has a normal mood and affect. Her behavior is normal.  Nursing note and vitals reviewed.    ED Treatments / Results  DIAGNOSTIC STUDIES: Oxygen Saturation is 99% on room air, normal by my interpretation.    COORDINATION OF CARE:   Labs (all labs ordered are listed, but only abnormal results are displayed) Labs Reviewed  BASIC METABOLIC PANEL - Abnormal; Notable for the following:       Result Value   Sodium 127 (*)    Chloride 97 (*)    CO2 18 (*)    Glucose, Bld 334 (*)    Calcium 8.7 (*)    All other components within normal limits  CBC - Abnormal; Notable for the following:    WBC 14.8 (*)    All other components within normal limits  URINALYSIS, ROUTINE W REFLEX MICROSCOPIC -  Abnormal; Notable for the following:    Glucose, UA >=500 (*)    Hgb urine dipstick SMALL (*)    Ketones, ur 20 (*)    Nitrite POSITIVE (*)    Leukocytes, UA TRACE (*)    Squamous Epithelial / LPF 0-5 (*)    All other components within normal limits  TROPONIN I - Abnormal; Notable for the following:    Troponin I 0.06 (*)    All other components within normal limits  BRAIN NATRIURETIC PEPTIDE - Abnormal; Notable for the following:    B Natriuretic Peptide 101.5 (*)    All other components within normal limits  HEPATIC FUNCTION PANEL - Abnormal; Notable for the following:    Albumin 3.3 (*)    Total Bilirubin 1.5 (*)    Indirect Bilirubin 1.2 (*)    All other components within normal limits  TROPONIN I - Abnormal; Notable for the following:    Troponin I 0.07 (*)    All other components within normal limits  CBG MONITORING, ED - Abnormal; Notable for the following:    Glucose-Capillary 251 (*)    All other components within normal limits  CBG MONITORING, ED - Abnormal; Notable for the following:    Glucose-Capillary 327 (*)    All other components within normal limits  URINE CULTURE  CULTURE, BLOOD (ROUTINE X 2)  CULTURE, BLOOD (ROUTINE X 2)  GASTROINTESTINAL PANEL BY PCR, STOOL (REPLACES STOOL CULTURE)  LIPASE, BLOOD  TSH  OSMOLALITY  SODIUM, URINE, RANDOM  OSMOLALITY, URINE  HIV ANTIBODY (ROUTINE TESTING)  CBG MONITORING, ED  I-STAT CG4 LACTIC ACID, ED  I-STAT VENOUS BLOOD GAS, ED    EKG  EKG Interpretation  Date/Time:  Saturday March 22 2017 00:59:29 EDT Ventricular Rate:  71 PR Interval:    QRS Duration: 138 QT Interval:  445 QTC Calculation: 484 R Axis:   115 Text Interpretation:  Sinus rhythm Nonspecific intraventricular conduction delay Minimal ST depression, inferior leads No significant change was found Confirmed by Glynn Octave [phone]) on 03/22/2017 1:15:26 AM  Radiology Dg Abdomen Acute W/chest  Result Date: 03/22/2017 CLINICAL DATA:  Acute  onset of generalized weakness. Decreased appetite, headache, dizziness, diaphoresis and nausea. Diarrhea. Initial encounter. EXAM: DG ABDOMEN ACUTE W/ 1V CHEST COMPARISON:  Chest radiograph and CT of the chest performed 09/16/2016 FINDINGS: The lungs are well-aerated. Mild left basilar atelectasis is noted. There is no evidence of pleural effusion or pneumothorax. The cardiomediastinal silhouette is borderline enlarged. A pacemaker/AICD is noted overlying the left chest wall, with leads ending overlying the right atrium, right ventricle and coronary sinus. The visualized bowel gas pattern is unremarkable. Scattered stool and air are seen within the colon; there is no evidence of small bowel dilatation to suggest obstruction. No free intra-abdominal air is identified on the provided upright view. No acute osseous abnormalities are seen; the sacroiliac joints are unremarkable in appearance. IMPRESSION: 1. Unremarkable bowel gas pattern; no free intra-abdominal air seen. Small amount of stool noted in the colon. 2. Mild left basilar atelectasis noted.  Borderline cardiomegaly. Electronically Signed   By: Roanna Raider M.D.   On: 03/22/2017 01:42    Procedures Procedures (including critical care time)  Medications Ordered in ED Medications  sodium chloride 0.9 % bolus 500 mL (0 mLs Intravenous Stopped 03/22/17 0217)     Initial Impression / Assessment and Plan / ED Course  I have reviewed the triage vital signs and the nursing notes.  Pertinent labs & imaging results that were available during my care of the patient were reviewed by me and considered in my medical decision making (see chart for details).     Patient from home with one week of generalized weakness, anorexia, nausea, diarrhea. No vomiting. No fever. Has not been taking her insulin. Had some pain earlier near her AICD which is since resolved. Denies chest pain or abdominal pain.  Patient ill-appearing. EKG is unchanged. Labs show  hyperglycemia with normal anion gap and hyponatremia. Troponin minimally elevated.  Patient with nonionic gap acidosis. Will hydrate gently given her cardiomyopathy. No chest pain. Her abdomen is soft.  Rocephin given for apparent UTI. Blood and urine culture sent. Lactate is normal. Vitals are stable.  Admission for hydration and monitoring of cultures. Continue Rocephin. D/w Dr. Maryfrances Bunnell. Final Clinical Impressions(s) / ED Diagnoses   Final diagnoses:  Dehydration  Hyperglycemia  Urinary tract infection without hematuria, site unspecified    New Prescriptions New Prescriptions   No medications on file  I personally performed the services described in this documentation, which was scribed in my presence. The recorded information has been reviewed and is accurate.    Glynn Octave, MD 03/22/17 [phone]

## 2017-03-22 NOTE — Progress Notes (Signed)
Pt arrived room with husband at around 4:30am. Pt A x O x 4. Pt oriented to the room and equipment, call bell and phone. Vital signs were completed. Cardiac telemetry was initiated and skin assessment completed. Pt assessed to have fever. MD was paged and order for PO Tylenol was given. Medication administered. Enteric precaution initiated, pt was educated. Pt home meds brought into the hospital were taken to the pharmacy.  No complaints of pain. Will continue to monitor.

## 2017-03-22 NOTE — H&P (Signed)
History and Physical  Patient Name: Linda Lowe     EXB:[id number]    DOB: 04-03-1951    DOA: 03/22/2017 PCP: Renaye Rakers, MD  Patient coming from: Home  Chief Complaint: Weakness, decreased appetite, diarrhea, malaise, cough, dizziness, headache      HPI: Linda Lowe is a 66 y.o. 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  The patient was in her usual state of health until about a week ago when she started to have nausea, malaise and diarrhea, up to 4 watery bowel movements per day.  The symptoms persisted all week, and actually progressed until today she was mostly unable to get out of bed and so she came to the emergency room this evening. There was associated confusion at times, headache, occasional cough, dizziness, generalized weakness. There was no fever, sputum production, dyspnea. There was no dysuria, hematuria, urinary frequency, urgency, or flank pain. There was no focal weakness, slurred speech.  ED course: -Temp 31F initially, increased to 103F while in the ER, heart rate 74, respirations pulse ox normal, blood pressure 131/74 -Na 127 (corrects to 131 with glucose), K 4.2, Cr 0.88 (at baseline), WBC 14.8K, Hgb 12.9 -Lipase normal -Troponin 0.06 -BNP 101 -Lactate normal -Bicarb 18, anion gap normal -Urinalysis with glucosuria, trace ketones, few WBCs, nitrites -CXR without pneumonia -Abdomen radiograph without SBO or free air -She was given fluids and TRH were asked to evaluate for dehydration      ROS: Review of Systems  Constitutional: Positive for chills, diaphoresis and malaise/fatigue. Negative for fever.  HENT: Negative for congestion and sore throat.   Respiratory: Positive for cough. Negative for sputum production, shortness of breath and wheezing.   Cardiovascular: Negative for chest pain and leg swelling.  Gastrointestinal: Positive for diarrhea and nausea. Negative for abdominal  pain, blood in stool, constipation, melena and vomiting.  Genitourinary: Negative for dysuria, flank pain, frequency, hematuria and urgency.  Musculoskeletal: Negative for back pain, myalgias and neck pain.  Neurological: Positive for dizziness, weakness and headaches. Negative for sensory change, speech change, focal weakness, seizures and loss of consciousness.  Psychiatric/Behavioral: Positive for memory loss.          Past Medical History:  Diagnosis Date  . Arthritis   . Cardiomyopathy (HCC)    a. 05/2012 Echo: EF 30-35%, Gr 1 DD, mild LVH, mild MR, pericardial effusion  . Diabetes mellitus    a. Dx > 5 y ago  . Hypercholesteremia   . Hypertension   . Pericardial effusion    a. 05/2012 Echo: circumferential pericardial effusion, mostly 26mm, largest pocket posterior - 67mm, mild RA indentation, no RV collapse, no tamponade.    Past Surgical History:  Procedure Laterality Date  . ABDOMINAL HYSTERECTOMY    . BI-VENTRICULAR IMPLANTABLE CARDIOVERTER DEFIBRILLATOR N/A 12/01/2013   Procedure: BI-VENTRICULAR IMPLANTABLE CARDIOVERTER DEFIBRILLATOR  (CRT-D);  Surgeon: Duke Salvia, MD;  Location: [facility] CATH LAB;  Service: Cardiovascular;  Laterality: N/A;  . FOOT SURGERY    . LEFT AND RIGHT HEART CATHETERIZATION WITH CORONARY ANGIOGRAM  05/26/2012   Procedure: LEFT AND RIGHT HEART CATHETERIZATION WITH CORONARY ANGIOGRAM;  Surgeon: Tonny Bollman, MD;  Location: [facility] CATH LAB;  Service: Cardiovascular;;    Social History: Patient lives with her husband.  The patient walks without a cane or walker.  Independent for all ADLs.  Nonsmoker.  No Known Allergies  Family history: family history includes Tuberculosis in her  father.  Prior to Admission medications   Medication Sig Start Date End Date Taking? Authorizing Provider  amLODipine (NORVASC) 5 MG tablet TK 1 T PO QD 09/16/16  Yes [provider]  atorvastatin (LIPITOR) 40 MG tablet TAKE 1 TABLET(40 MG) BY MOUTH DAILY 12/07/16  Yes  Reather Littler, MD  B-D UF III MINI PEN NEEDLES 31G X 5 MM MISC USE 5 NEEDLES EVERY DAY AS DIRECTED 03/13/17  Yes Reather Littler, MD  Canagliflozin-Metformin HCl (INVOKAMET) [id number] MG TABS Take 1 tablet by mouth 2 (two) times daily. 11/12/16  Yes Reather Littler, MD  carvedilol (COREG) 25 MG tablet Take 25 mg by mouth 2 (two) times daily. 07/15/15  Yes [provider]  EPINEPHrine (EPIPEN) 0.3 mg/0.3 mL DEVI Inject 0.3 mg into the muscle as needed (for allergic reaction). For allergic reactions    Yes [provider]  furosemide (LASIX) 20 MG tablet Take 20 mg by mouth 2 (two) times daily.    Yes [provider]  glucose blood (ONETOUCH VERIO) test strip Use to test blood sugar 3 times daily 10/04/16  Yes Reather Littler, MD  Insulin Pen Needle (B-D UF III MINI PEN NEEDLES) 31G X 5 MM MISC USE 5 PEN NEEDLES PER DAY AS INSTRUCTED 10/14/16  Yes Reather Littler, MD  irbesartan (AVAPRO) 300 MG tablet Take 300 mg by mouth daily.   Yes [provider]  naproxen (NAPROSYN) 500 MG tablet Take 500 mg by mouth 2 (two) times daily as needed for mild pain.    Yes [provider]  NOVOLOG FLEXPEN 100 UNIT/ML FlexPen ADMINISTER 16 UNITS UNDER THE SKIN TWICE DAILY BEFORE LUNCH AND SUPPER 02/19/17  Yes Reather Littler, MD  [facility] DELICA LANCETS FINE MISC USE TO TEST BLOOD SUGAR 3 TIMES DAILY 10/28/16  Yes Reather Littler, MD  oxybutynin (DITROPAN-XL) 5 MG 24 hr tablet Take 5 mg by mouth daily.   Yes [provider]  potassium chloride SA (K-DUR,KLOR-CON) 20 MEQ tablet TAKE 1 TABLET(20 MEQ) BY MOUTH DAILY 12/01/16  Yes Reather Littler, MD  TOUJEO SOLOSTAR 300 UNIT/ML SOPN INJECT 40 UNITS INTO THE SKIN DAILY Patient taking differently: Inject 42 Units into the skin daily.  12/30/16  Yes Reather Littler, MD  VICTOZA 18 MG/3ML SOPN INJECT 1.8 MG UNDER THE SKIN DAILY 03/09/17  Yes Reather Littler, MD       Physical Exam: BP 132/65   Pulse 87   Temp (!) 103 F (39.4 C) (Oral)   Resp (!) 22   Ht 5'  3.5" (1.613 m)   Wt 72.6 kg (160 lb)   SpO2 96%   BMI 27.90 kg/m  General appearance: Well-developed, obese adult female, awake and alert but appears tired and weak.   Eyes: Anicteric, conjunctiva pink, lids and lashes normal. PERRL.    ENT: No nasal deformity, discharge, epistaxis.  Hearing normal. OP tacky dry without lesions.   Neck: No neck masses.  Trachea midline.  No thyromegaly/tenderness. Lymph: No cervical or supraclavicular lymphadenopathy. Skin: Warm and dry.  No jaundice.  No suspicious rashes or lesions. Cardiac: Tachycardic, regular, nl S1-S2, no murmurs appreciated.  Capillary refill is brisk.  JVP not visible.  No LE edema.  Radial and DP pulses 2+ and symmetric. Respiratory: Normal respiratory rate and rhythm.  CTAB without rales or wheezes. Abdomen: Abdomen soft.  No TTP. No ascites, distension, hepatosplenomegaly.   MSK: No deformities or effusions.  No cyanosis or clubbing. Neuro: Cranial nerves 3-12 intact.  Sensation intact to light touch.  Speech is fluent.  Muscle strength normal and symmetric.    Psych: Sensorium intact and responding to questions, attention somewhat diminished.  Behavior appropriate.  Affect blunted.  Judgment and insight appear normal.     Labs on Admission:  I have personally reviewed following labs and imaging studies: CBC:  Recent Labs Lab 03/22/17 0057  WBC 14.8*  HGB 12.9  HCT 38.4  MCV 86.1  PLT 168   Basic Metabolic Panel:  Recent Labs Lab 03/22/17 0057  NA 127*  K 4.2  CL 97*  CO2 18*  GLUCOSE 334*  BUN 16  CREATININE 0.88  CALCIUM 8.7*   GFR: Estimated Creatinine Clearance: 62.4 mL/min (by C-G formula based on SCr of 0.88 mg/dL).   Recent Labs Lab 03/22/17 0057  LIPASE 29   Cardiac Enzymes:  Recent Labs Lab 03/22/17 0057  TROPONINI 0.06*   CBG:  Recent Labs Lab 03/22/17 0054 03/22/17 0055  GLUCAP 251* 327*   Sepsis Labs: Lactate 1.6        Radiological Exams on Admission: Personally  reviewed CXR shows no pneumonia, Abdomen x-ray report reviewed: Dg Abdomen Acute W/chest  Result Date: 03/22/2017 CLINICAL DATA:  Acute onset of generalized weakness. Decreased appetite, headache, dizziness, diaphoresis and nausea. Diarrhea. Initial encounter. EXAM: DG ABDOMEN ACUTE W/ 1V CHEST COMPARISON:  Chest radiograph and CT of the chest performed 09/16/2016 FINDINGS: The lungs are well-aerated. Mild left basilar atelectasis is noted. There is no evidence of pleural effusion or pneumothorax. The cardiomediastinal silhouette is borderline enlarged. A pacemaker/AICD is noted overlying the left chest wall, with leads ending overlying the right atrium, right ventricle and coronary sinus. The visualized bowel gas pattern is unremarkable. Scattered stool and air are seen within the colon; there is no evidence of small bowel dilatation to suggest obstruction. No free intra-abdominal air is identified on the provided upright view. No acute osseous abnormalities are seen; the sacroiliac joints are unremarkable in appearance. IMPRESSION: 1. Unremarkable bowel gas pattern; no free intra-abdominal air seen. Small amount of stool noted in the colon. 2. Mild left basilar atelectasis noted.  Borderline cardiomegaly. Electronically Signed   By: Roanna Raider M.D.   On: 03/22/2017 01:42    EKG: Independently reviewed. Rate 71, QTc 484, no change from previous.  Echocardiogram 2014: Report reviewed EF 35% Grade I DD Mild MR and TR        Assessment/Plan  1. SIRS/Fever and diarrhea:  Unclear cause of SIRS.  Hemodynamically stable, lactate normal, somewhat doubt this minimal elevated troponin represents end-organ damage.    Diagnostic considerations include viral GE vs colitis vs UTI (only has asymptomatic bacteriuria) vs occult bacteremia. -Obtain blood cultures -Follow urine culture -Ceftriaxone and Flagyl to cover colitis and UTI, low threshold to de-escalate -Obtain stool studies -IV  fluids -Check LFTs  2. Hyponatremia:  Mild, associated with weakness confusion.  Corrects to 131 with glucose. -Check urine studies, Posm/Uosm ratio -Gentle IVF  3. Elevated troponin:  -Trend troponin -Echo ordered  4. Chronic systolic and diastolic CHF:  EF 35% previously.  NICM. -Hold furosemide for now -Hold ACEi for now -Continue BB  5. Non-anion gap acidosis:  From diarrhea.  6. Insulin-dependent diabetes:  -Hold metformin, SGLT2 inhib.  Mild acidosis without anion gap.   -Continue glargine, lower dose until needs known -SSI with meals -Mind the gap  7. Hypertension:  -Continue BB -Hold amlodipine and irbesartan until hemodyanmics clearer     DVT prophylaxis: Lovenox  Code Status: FULL  Family Communication: Husband  at bedside  Disposition Plan: Anticipate IV fluids, obtain cultuers and given CTX/flagyl empirically for colitis. Obtain stool studies.  Check echocardiogram.  MOnitor sodium.  If testing all normal today, likely home tomorrow. Consults called: None Admission status: OBS At the point of initial evaluation, it is my clinical opinion that admission for OBSERVATION is reasonable and necessary because the patient's presenting complaints in the context of their chronic conditions represent sufficient risk of deterioration or significant morbidity to constitute reasonable grounds for close observation in the hospital setting, but that the patient may be medically stable for discharge from the hospital within 24 to 48 hours.    Medical decision making: Patient seen at 3:30 AM on 03/22/2017.  The patient was discussed with Dr. Manus Gunning.  What exists of the patient's chart was reviewed in depth and summarized above.  Clinical condition: stable hemodynamically for telemetry monitoring.        Alberteen Sam [facility] Pager [phone]

## 2017-03-22 NOTE — ED Triage Notes (Addendum)
Per EMS, pt from home. Pt c/o generalized weakness the past week. Pt c/o decreased appetite, headache, dizziness, sweating and nausea. Pt reports diarrhea past 3 days. Denies pain, CP and SOB. Pt hasn't been taking her medications including insulin. Pt has hx of diabetes. Per  EMS CBG 391. Pt has a pacemaker, HR 90 paced. BP 132/76. Received 300ccs NS by EMS. Pt also reports pain near pacemaker tonight.

## 2017-03-22 NOTE — ED Notes (Signed)
Pt stated she felt dizzy while laying flat but not while sitting up or dizzy. Pt was shivering, from being cold, continuously while getting orthostatic vitals.

## 2017-03-22 NOTE — Progress Notes (Signed)
  Echocardiogram 2D Echocardiogram has been performed.  Linda Lowe 03/22/2017, 3:39 PM

## 2017-03-22 NOTE — ED Notes (Signed)
Admitting MD at bedside.

## 2017-03-22 NOTE — ED Notes (Signed)
Pt given water per MD  

## 2017-03-22 NOTE — ED Notes (Signed)
Patient transported to X-ray 

## 2017-03-22 NOTE — Progress Notes (Signed)
Patient seen and evaluated earlier this a.m. by my associate. Please refer to H&P for details regarding history, assessment, and plan. Patient currently being investigated and we will have to wait for results before knowing what definitive treatment to administer. Currently patient is on Rocephin and Flagyl and workup also included GI pathogen panel.  Patient seen and evaluated Gen.: Patient in no acute distress, alert and awake Cardiovascular: No cyanosis  Pulmonary: No increased work of breathing, no wheezes  Will plan on reassessing next am.  Linda Lowe

## 2017-03-22 NOTE — ED Notes (Signed)
ED Provider at bedside. 

## 2017-03-22 NOTE — Progress Notes (Signed)
Pharmacy Antibiotic Note  Linda Lowe is a 66 y.o. female admitted on 03/22/2017 with intra-abdominal infection.  Pharmacy has been consulted for Ceftriaxone dosing. WBC elevated.   Plan: -Ceftriaxone 2g IV q24h -Flagyl per MD -F/U infectious work-up  Height: 5' 3.5" (161.3 cm) Weight: 160 lb (72.6 kg) IBW/kg (Calculated) : 53.55  Temp (24hrs), Avg:102 F (38.9 C), Min:99.9 F (37.7 C), Max:103.2 F (39.6 C)   Recent Labs Lab 03/22/17 0057 03/22/17 0133  WBC 14.8*  --   CREATININE 0.88  --   LATICACIDVEN  --  1.63    Estimated Creatinine Clearance: 62.4 mL/min (by C-G formula based on SCr of 0.88 mg/dL).    No Known Allergies    Abran Duke 03/22/2017 5:06 AM

## 2017-03-23 DIAGNOSIS — E7800 Pure hypercholesterolemia, unspecified: Secondary | ICD-10-CM | POA: Diagnosis present

## 2017-03-23 DIAGNOSIS — I5022 Chronic systolic (congestive) heart failure: Secondary | ICD-10-CM | POA: Diagnosis not present

## 2017-03-23 DIAGNOSIS — B9620 Unspecified Escherichia coli [E. coli] as the cause of diseases classified elsewhere: Secondary | ICD-10-CM | POA: Diagnosis present

## 2017-03-23 DIAGNOSIS — Z794 Long term (current) use of insulin: Secondary | ICD-10-CM | POA: Diagnosis not present

## 2017-03-23 DIAGNOSIS — R778 Other specified abnormalities of plasma proteins: Secondary | ICD-10-CM | POA: Diagnosis present

## 2017-03-23 DIAGNOSIS — E872 Acidosis: Secondary | ICD-10-CM | POA: Diagnosis present

## 2017-03-23 DIAGNOSIS — Z79899 Other long term (current) drug therapy: Secondary | ICD-10-CM | POA: Diagnosis not present

## 2017-03-23 DIAGNOSIS — I10 Essential (primary) hypertension: Secondary | ICD-10-CM | POA: Diagnosis not present

## 2017-03-23 DIAGNOSIS — E871 Hypo-osmolality and hyponatremia: Secondary | ICD-10-CM | POA: Diagnosis not present

## 2017-03-23 DIAGNOSIS — A419 Sepsis, unspecified organism: Secondary | ICD-10-CM | POA: Diagnosis present

## 2017-03-23 DIAGNOSIS — E860 Dehydration: Secondary | ICD-10-CM | POA: Diagnosis present

## 2017-03-23 DIAGNOSIS — I472 Ventricular tachycardia: Secondary | ICD-10-CM | POA: Diagnosis present

## 2017-03-23 DIAGNOSIS — A4151 Sepsis due to Escherichia coli [E. coli]: Secondary | ICD-10-CM | POA: Diagnosis not present

## 2017-03-23 DIAGNOSIS — Z90710 Acquired absence of both cervix and uterus: Secondary | ICD-10-CM | POA: Diagnosis not present

## 2017-03-23 DIAGNOSIS — R197 Diarrhea, unspecified: Secondary | ICD-10-CM | POA: Diagnosis present

## 2017-03-23 DIAGNOSIS — I5042 Chronic combined systolic (congestive) and diastolic (congestive) heart failure: Secondary | ICD-10-CM | POA: Diagnosis present

## 2017-03-23 DIAGNOSIS — E1165 Type 2 diabetes mellitus with hyperglycemia: Secondary | ICD-10-CM | POA: Diagnosis not present

## 2017-03-23 DIAGNOSIS — I429 Cardiomyopathy, unspecified: Secondary | ICD-10-CM | POA: Diagnosis present

## 2017-03-23 DIAGNOSIS — N390 Urinary tract infection, site not specified: Secondary | ICD-10-CM | POA: Diagnosis not present

## 2017-03-23 DIAGNOSIS — B9689 Other specified bacterial agents as the cause of diseases classified elsewhere: Secondary | ICD-10-CM | POA: Diagnosis present

## 2017-03-23 DIAGNOSIS — I110 Hypertensive heart disease with heart failure: Secondary | ICD-10-CM | POA: Diagnosis present

## 2017-03-23 LAB — GASTROINTESTINAL PANEL BY PCR, STOOL (REPLACES STOOL CULTURE)

## 2017-03-23 LAB — BASIC METABOLIC PANEL
Anion gap: 9 (ref 5–15)
BUN: 14 mg/dL (ref 6–20)
CO2: 21 mmol/L — ABNORMAL LOW (ref 22–32)
Calcium: 8.6 mg/dL — ABNORMAL LOW (ref 8.9–10.3)
Chloride: 101 mmol/L (ref 101–111)
Creatinine, Ser: 0.63 mg/dL (ref 0.44–1.00)
GFR calc Af Amer: >60 mL/min (ref >60)
GFR calc non Af Amer: >60 mL/min (ref >60)
Glucose, Bld: 185 mg/dL — ABNORMAL HIGH (ref 65–99)
Potassium: 3.9 mmol/L (ref 3.5–5.1)
Sodium: 131 mmol/L — ABNORMAL LOW (ref 135–145)

## 2017-03-23 LAB — BLOOD CULTURE ID PANEL (REFLEXED)

## 2017-03-23 LAB — GLUCOSE, CAPILLARY
Glucose-Capillary: 182 mg/dL — ABNORMAL HIGH (ref 65–99)
Glucose-Capillary: 212 mg/dL — ABNORMAL HIGH (ref 65–99)
Glucose-Capillary: 260 mg/dL — ABNORMAL HIGH (ref 65–99)
Glucose-Capillary: 234 mg/dL — ABNORMAL HIGH (ref 65–99)

## 2017-03-23 LAB — CBC
HCT: 36.4 % (ref 36.0–46.0)
Hemoglobin: 12.2 g/dL (ref 12.0–15.0)
MCH: 28.6 pg (ref 26.0–34.0)
MCHC: 33.5 g/dL (ref 30.0–36.0)
MCV: 85.4 fL (ref 78.0–100.0)
Platelets: 160 10*3/uL (ref 150–400)
RBC: 4.26 MIL/uL (ref 3.87–5.11)
RDW: 13.3 % (ref 11.5–15.5)
WBC: 9.2 10*3/uL (ref 4.0–10.5)

## 2017-03-23 MED ORDER — MEROPENEM 1 G IV SOLR
1.0000 g | Freq: Three times a day (TID) | INTRAVENOUS | Status: DC
  Administered 2017-03-23 – 2017-03-24 (×3): 1 g via INTRAVENOUS
  Filled 2017-03-23 (×4): qty 1

## 2017-03-23 NOTE — Progress Notes (Signed)
PROGRESS NOTE    Linda Lowe  HAL:[id number] DOB: [birth year] DOA: 03/22/2017 PCP: Renaye Rakers, MD    Brief Narrative:   66 y.o. 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  Pt had positive urine culture and blood culture.  Assessment & Plan:   Sepsis -Looks like source of infection from urinary tract. I suspect patient has bacteremia secondary to urinary source. Blood cultures suggests Enterobacteriaceae species and E. Coli  Gram-negative bacteremia - Place patient on Meropenem given that patient had   Diarrhea - GI pathogen panel negative   Hyponatremia:  Mild, improved with fluids. Most likely poor oral solute intake.   Elevated troponin:  -Trend troponin, no chest pain reported. -Echo ordered   Chronic systolic and diastolic CHF:  EF 35% previously.  NICM. -Hold furosemide for now -Hold ACEi for now -Continue BB   Non-anion gap acidosis:  From diarrhea.   Insulin-dependent diabetes:  -Hold metformin, SGLT2 inhib.   -Continue glargine, lower dose until needs known -SSI    Hypertension:  -Continue BB -Hold amlodipine and irbesartan until hemodyanmics clearer   DVT prophylaxis: Lovenox Code Status: Full Family Communication: discussed with  Disposition Plan:    Consultants:   None   Procedures: None   Antimicrobials: Meropenem and flagyl   Subjective: Patient has no new complaints. Denies any chest pain.   Objective: Vitals:   03/22/17 0700 03/22/17 1545 03/22/17 2109 03/23/17 0608  BP:  124/65 135/66 129/64  Pulse:  70 69 69  Resp:  18 18 18   Temp: 99.9 F (37.7 C) 99.3 F (37.4 C) 98.6 F (37 C) 99.3 F (37.4 C)  TempSrc: Oral Oral    SpO2:  99% 96% 96%  Weight:    73.5 kg (162 lb 1.6 oz)  Height:        Intake/Output Summary (Last 24 hours) at 03/23/17 1520 Last data filed at 03/23/17 0640  Gross per 24 hour  Intake             1290 ml    Output              950 ml  Net              340 ml   Filed Weights   03/22/17 0057 03/22/17 0433 03/23/17 7353  Weight: 72.6 kg (160 lb) 72.7 kg (160 lb 3.2 oz) 73.5 kg (162 lb 1.6 oz)    Examination:  General exam: Appears calm and comfortable, No acute distress  Respiratory system: Clear to auscultation. Respiratory effort normal. Cardiovascular system: S1 & S2 heard, RRR. No JVD, murmurs, rubs, gallops  Gastrointestinal system: Abdomen is nondistended, soft and nontender. No organomegaly or masses felt. Normal bowel sounds heard. Central nervous system: Alert and oriented. No focal neurological deficits. Extremities: Symmetric 5 x 5 power. Skin: No rashes, lesions or ulcers on limited exam Psychiatry: Judgement and insight appear normal. Mood & affect appropriate.     Data Reviewed: I have personally reviewed following labs and imaging studies  CBC:  Recent Labs Lab 03/22/17 0057 03/23/17 0540  WBC 14.8* 9.2  HGB 12.9 12.2  HCT 38.4 36.4  MCV 86.1 85.4  PLT 168 160   Basic Metabolic Panel:  Recent Labs Lab 03/22/17 0057 03/23/17 0540  NA 127* 131*  K 4.2 3.9  CL 97* 101  CO2 18* 21*  GLUCOSE 334* 185*  BUN 16 14  CREATININE 0.88  0.63  CALCIUM 8.7* 8.6*   GFR: Estimated Creatinine Clearance: 68.2 mL/min (by C-G formula based on SCr of 0.63 mg/dL). Liver Function Tests:  Recent Labs Lab 03/22/17 0529  AST 34  ALT 27  ALKPHOS 84  BILITOT 1.5*  PROT 7.5  ALBUMIN 3.3*    Recent Labs Lab 03/22/17 0057  LIPASE 29   No results for input(s): AMMONIA in the last 168 hours. Coagulation Profile: No results for input(s): INR, PROTIME in the last 168 hours. Cardiac Enzymes:  Recent Labs Lab 03/22/17 0057 03/22/17 0529  TROPONINI 0.06* 0.07*   BNP (last 3 results) No results for input(s): PROBNP in the last 8760 hours. HbA1C: No results for input(s): HGBA1C in the last 72 hours. CBG:  Recent Labs Lab 03/22/17 1202 03/22/17 1614  03/22/17 2109 03/23/17 0744 03/23/17 1227  GLUCAP 202* 160* 217* 182* 212*   Lipid Profile: No results for input(s): CHOL, HDL, LDLCALC, TRIG, CHOLHDL, LDLDIRECT in the last 72 hours. Thyroid Function Tests:  Recent Labs  03/22/17 0529  TSH 1.277   Anemia Panel: No results for input(s): VITAMINB12, FOLATE, FERRITIN, TIBC, IRON, RETICCTPCT in the last 72 hours. Sepsis Labs:  Recent Labs Lab 03/22/17 0133  LATICACIDVEN 1.63    Recent Results (from the past 240 hour(s))  Urine Culture     Status: Abnormal (Preliminary result)   Collection Time: 03/22/17  2:00 AM  Result Value Ref Range Status   Specimen Description URINE, RANDOM  Final   Special Requests NONE  Final   Culture (A)  Final    >=100,000 COLONIES/mL ESCHERICHIA COLI SUSCEPTIBILITIES TO FOLLOW    Report Status PENDING  Incomplete  Culture, blood (routine x 2)     Status: None (Preliminary result)   Collection Time: 03/22/17  5:29 AM  Result Value Ref Range Status   Specimen Description BLOOD RIGHT ANTECUBITAL  Final   Special Requests   Final    BOTTLES DRAWN AEROBIC AND ANAEROBIC Blood Culture adequate volume   Culture  Setup Time   Final    AEROBIC BOTTLE ONLY GRAM NEGATIVE RODS CRITICAL RESULT CALLED TO, READ BACK BY AND VERIFIED WITH: TO KCOOK(PHARMd) by [email or web address] 03/23/2017 at 2:47am    Culture   Final    GRAM NEGATIVE RODS CULTURE REINCUBATED FOR BETTER GROWTH    Report Status PENDING  Incomplete  Blood Culture ID Panel (Reflexed)     Status: Abnormal   Collection Time: 03/22/17  5:29 AM  Result Value Ref Range Status   Enterococcus species NOT DETECTED NOT DETECTED Final   Listeria monocytogenes NOT DETECTED NOT DETECTED Final   Staphylococcus species NOT DETECTED NOT DETECTED Final   Staphylococcus aureus NOT DETECTED NOT DETECTED Final   Streptococcus species NOT DETECTED NOT DETECTED Final   Streptococcus agalactiae NOT DETECTED NOT DETECTED Final   Streptococcus pneumoniae NOT DETECTED NOT  DETECTED Final   Streptococcus pyogenes NOT DETECTED NOT DETECTED Final   Acinetobacter baumannii NOT DETECTED NOT DETECTED Final   Enterobacteriaceae species DETECTED (A) NOT DETECTED Final    Comment: Enterobacteriaceae represent a large family of gram-negative bacteria, not a single organism. CRITICAL RESULT CALLED TO, READ BACK BY AND VERIFIED WITH: TO KCOOK(PHARMd) by [facility] 03/23/2017 AT 2:43AM    Enterobacter cloacae complex NOT DETECTED NOT DETECTED Final   Escherichia coli DETECTED (A) NOT DETECTED Final    Comment: CRITICAL RESULT CALLED TO, READ BACK BY AND VERIFIED WITH: TO KCOOK(PHARMd) by [facility] 03/23/2017 AT 2:43AM    Klebsiella oxytoca NOT DETECTED NOT  DETECTED Final   Klebsiella pneumoniae NOT DETECTED NOT DETECTED Final   Proteus species NOT DETECTED NOT DETECTED Final   Serratia marcescens NOT DETECTED NOT DETECTED Final   Carbapenem resistance NOT DETECTED NOT DETECTED Final   Haemophilus influenzae NOT DETECTED NOT DETECTED Final   Neisseria meningitidis NOT DETECTED NOT DETECTED Final   Pseudomonas aeruginosa NOT DETECTED NOT DETECTED Final   Candida albicans NOT DETECTED NOT DETECTED Final   Candida glabrata NOT DETECTED NOT DETECTED Final   Candida krusei NOT DETECTED NOT DETECTED Final   Candida parapsilosis NOT DETECTED NOT DETECTED Final   Candida tropicalis NOT DETECTED NOT DETECTED Final  Culture, blood (routine x 2)     Status: None (Preliminary result)   Collection Time: 03/22/17  5:34 AM  Result Value Ref Range Status   Specimen Description BLOOD LEFT HAND  Final   Special Requests IN PEDIATRIC BOTTLE Blood Culture adequate volume  Final   Culture NO GROWTH 1 DAY  Final   Report Status PENDING  Incomplete  Gastrointestinal Panel by PCR , Stool     Status: None   Collection Time: 03/22/17  5:47 PM  Result Value Ref Range Status   Campylobacter species NOT DETECTED NOT DETECTED Final   Plesimonas shigelloides NOT DETECTED NOT DETECTED Final    Salmonella species NOT DETECTED NOT DETECTED Final   Yersinia enterocolitica NOT DETECTED NOT DETECTED Final   Vibrio species NOT DETECTED NOT DETECTED Final   Vibrio cholerae NOT DETECTED NOT DETECTED Final   Enteroaggregative E coli (EAEC) NOT DETECTED NOT DETECTED Final   Enteropathogenic E coli (EPEC) NOT DETECTED NOT DETECTED Final   Enterotoxigenic E coli (ETEC) NOT DETECTED NOT DETECTED Final   Shiga like toxin producing E coli (STEC) NOT DETECTED NOT DETECTED Final   Shigella/Enteroinvasive E coli (EIEC) NOT DETECTED NOT DETECTED Final   Cryptosporidium NOT DETECTED NOT DETECTED Final   Cyclospora cayetanensis NOT DETECTED NOT DETECTED Final   Entamoeba histolytica NOT DETECTED NOT DETECTED Final   Giardia lamblia NOT DETECTED NOT DETECTED Final   Adenovirus F40/41 NOT DETECTED NOT DETECTED Final   Astrovirus NOT DETECTED NOT DETECTED Final   Norovirus GI/GII NOT DETECTED NOT DETECTED Final   Rotavirus A NOT DETECTED NOT DETECTED Final   Sapovirus (I, II, IV, and V) NOT DETECTED NOT DETECTED Final         Radiology Studies: Dg Abdomen Acute W/chest  Result Date: 03/22/2017 CLINICAL DATA:  Acute onset of generalized weakness. Decreased appetite, headache, dizziness, diaphoresis and nausea. Diarrhea. Initial encounter. EXAM: DG ABDOMEN ACUTE W/ 1V CHEST COMPARISON:  Chest radiograph and CT of the chest performed 09/16/2016 FINDINGS: The lungs are well-aerated. Mild left basilar atelectasis is noted. There is no evidence of pleural effusion or pneumothorax. The cardiomediastinal silhouette is borderline enlarged. A pacemaker/AICD is noted overlying the left chest wall, with leads ending overlying the right atrium, right ventricle and coronary sinus. The visualized bowel gas pattern is unremarkable. Scattered stool and air are seen within the colon; there is no evidence of small bowel dilatation to suggest obstruction. No free intra-abdominal air is identified on the provided upright  view. No acute osseous abnormalities are seen; the sacroiliac joints are unremarkable in appearance. IMPRESSION: 1. Unremarkable bowel gas pattern; no free intra-abdominal air seen. Small amount of stool noted in the colon. 2. Mild left basilar atelectasis noted.  Borderline cardiomegaly. Electronically Signed   By: Roanna Raider M.D.   On: 03/22/2017 01:42     Scheduled Meds: .  carvedilol  25 mg Oral BID  . enoxaparin (LOVENOX) injection  40 mg Subcutaneous Q24H  . insulin aspart  0-15 Units Subcutaneous TID WC  . insulin aspart  0-5 Units Subcutaneous QHS  . insulin glargine  15 Units Subcutaneous Daily   Continuous Infusions: . meropenem (MERREM) IV Stopped (03/23/17 1522)  . metronidazole Stopped (03/23/17 1331)     LOS: 0 days    Time spent: > 35 minutes  Penny Pia, MD [facility] Pager [phone]  If 7PM-7AM, please contact night-coverage [email or web address] Password [email or web address] 03/23/2017, 3:20 PM

## 2017-03-23 NOTE — Progress Notes (Signed)
Pharmacy Antibiotic Note  Linda Lowe is a 66 y.o. female admitted on 03/22/2017 with concern for intra-abdominal infection.  Pt was initially prescribed ceftriaxone and metronidazole.  Blood cx now growing GNR (BCID = Ecoli).   MD requested abx change to Merrem + Flagyl (pending GI panel results).  WBC trending down, renal function stable.    Plan: -Merrem 1gm IV q8h -Flagyl per MD -F/U infectious work-up  Height: 5\' 3"  (160 cm) Weight: 162 lb 1.6 oz (73.5 kg) IBW/kg (Calculated) : 52.4  Temp (24hrs), Avg:99.1 F (37.3 C), Min:98.6 F (37 C), Max:99.3 F (37.4 C)   Recent Labs Lab 03/22/17 0057 03/22/17 0133 03/23/17 0540  WBC 14.8*  --  9.2  CREATININE 0.88  --  0.63  LATICACIDVEN  --  1.63  --     Estimated Creatinine Clearance: 68.2 mL/min (by C-G formula based on SCr of 0.63 mg/dL).    Antimicrobials: Rocephin 7/7 >> 7/8 Flagyl 7/7 >> Merrem 7/8 >>  Culture data: 7/7 blood x 2  1/2 GNR (sens pending)       BCID = E coli 7/7 urine >100k GNR  No Known Allergies    Yerik Zeringue, Pharm.D., BCPS Clinical Pharmacist Pager: [phone] Clinical phone for 03/23/2017 from 8:30-4:00 is [phone]. After 4pm, please call Main Rx [phone]) for assistance. 03/23/2017 12:28 PM

## 2017-03-23 NOTE — Progress Notes (Signed)
PHARMACY - PHYSICIAN COMMUNICATION CRITICAL VALUE ALERT - BLOOD CULTURE IDENTIFICATION (BCID)  Results for orders placed or performed during the hospital encounter of 03/22/17  Blood Culture ID Panel (Reflexed) (Collected: 03/22/2017  5:29 AM)  Result Value Ref Range   Enterococcus species NOT DETECTED NOT DETECTED   Listeria monocytogenes NOT DETECTED NOT DETECTED   Staphylococcus species NOT DETECTED NOT DETECTED   Staphylococcus aureus NOT DETECTED NOT DETECTED   Streptococcus species NOT DETECTED NOT DETECTED   Streptococcus agalactiae NOT DETECTED NOT DETECTED   Streptococcus pneumoniae NOT DETECTED NOT DETECTED   Streptococcus pyogenes NOT DETECTED NOT DETECTED   Acinetobacter baumannii NOT DETECTED NOT DETECTED   Enterobacteriaceae species DETECTED (A) NOT DETECTED   Enterobacter cloacae complex NOT DETECTED NOT DETECTED   Escherichia coli DETECTED (A) NOT DETECTED   Klebsiella oxytoca NOT DETECTED NOT DETECTED   Klebsiella pneumoniae NOT DETECTED NOT DETECTED   Proteus species NOT DETECTED NOT DETECTED   Serratia marcescens NOT DETECTED NOT DETECTED   Carbapenem resistance NOT DETECTED NOT DETECTED   Haemophilus influenzae NOT DETECTED NOT DETECTED   Neisseria meningitidis NOT DETECTED NOT DETECTED   Pseudomonas aeruginosa NOT DETECTED NOT DETECTED   Candida albicans NOT DETECTED NOT DETECTED   Candida glabrata NOT DETECTED NOT DETECTED   Candida krusei NOT DETECTED NOT DETECTED   Candida parapsilosis NOT DETECTED NOT DETECTED   Candida tropicalis NOT DETECTED NOT DETECTED    Name of physician (or Provider) Contacted: Dr. Antionette Char   Changes to prescribed antibiotics required: No changes. Patient is already on appropriate therapy with Ceftriaxone 2g IV q24hr.   York Cerise, PharmD Clinical Pharmacist 03/23/17 2:53 AM

## 2017-03-24 LAB — URINE CULTURE: Culture: >=100000 — AB

## 2017-03-24 LAB — BASIC METABOLIC PANEL
Anion gap: 9 (ref 5–15)
BUN: 10 mg/dL (ref 6–20)
CO2: 22 mmol/L (ref 22–32)
Calcium: 8.8 mg/dL — ABNORMAL LOW (ref 8.9–10.3)
Chloride: 101 mmol/L (ref 101–111)
Creatinine, Ser: 0.59 mg/dL (ref 0.44–1.00)
GFR calc Af Amer: >60 mL/min (ref >60)
GFR calc non Af Amer: >60 mL/min (ref >60)
Glucose, Bld: 215 mg/dL — ABNORMAL HIGH (ref 65–99)
Potassium: 3.7 mmol/L (ref 3.5–5.1)
Sodium: 132 mmol/L — ABNORMAL LOW (ref 135–145)

## 2017-03-24 LAB — GLUCOSE, CAPILLARY
Glucose-Capillary: 249 mg/dL — ABNORMAL HIGH (ref 65–99)
Glucose-Capillary: 232 mg/dL — ABNORMAL HIGH (ref 65–99)
Glucose-Capillary: 245 mg/dL — ABNORMAL HIGH (ref 65–99)
Glucose-Capillary: 195 mg/dL — ABNORMAL HIGH (ref 65–99)

## 2017-03-24 LAB — MAGNESIUM: Magnesium: 1.9 mg/dL (ref 1.7–2.4)

## 2017-03-24 LAB — PHOSPHORUS: Phosphorus: 2 mg/dL — ABNORMAL LOW (ref 2.5–4.6)

## 2017-03-24 MED ORDER — CEFAZOLIN SODIUM-DEXTROSE 1-4 GM/50ML-% IV SOLN
1.0000 g | Freq: Three times a day (TID) | INTRAVENOUS | Status: DC
  Administered 2017-03-24 – 2017-03-26 (×6): 1 g via INTRAVENOUS
  Filled 2017-03-24 (×7): qty 50

## 2017-03-24 MED ORDER — HYDROCORTISONE 0.5 % EX CREA
1.0000 "application " | TOPICAL_CREAM | Freq: Two times a day (BID) | CUTANEOUS | Status: DC | PRN
  Filled 2017-03-24: qty 28.35

## 2017-03-24 NOTE — Progress Notes (Signed)
PROGRESS NOTE    Linda Lowe  CHE:[id number] DOB: Feb 20, 1951 DOA: 03/22/2017 PCP: Renaye Rakers, MD    Brief Narrative:   66 y.o. 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  Pt had positive urine culture and blood culture. Awaiting sensitivities currently. Patient had nonsustained V. tach  Assessment & Plan:   Sepsis -Narrowing antibiotic coverage. Discussed with pharmacist. Most likely urinary source. Awaiting sensitivities. Urine culture Escherichia coli was pansensitive.  Gram-negative bacteremia - Awaiting sensitivities as listed above. I suspect patient will most likely have pansensitive Escherichia coli. Antibiotic coverage narrowed.  Diarrhea - negative GI pathogen panel - Discontinuing Flagyl   Hyponatremia:  We'll reassess next a.m. Suspect poor oral solute intake as there was improvement with IV fluids.   Elevated troponin:  -Trend troponin, no chest pain reported. -Echo ordered   Chronic systolic and diastolic CHF:  EF 35% previously.  NICM. -Hold furosemide for now -Hold ACEi for now -Continue BB   Non-anion gap acidosis:  From diarrhea.   Insulin-dependent diabetes:  -Hold metformin, SGLT2 inhib.   -Continue glargine, lower dose until needs known -SSI    Hypertension:  -Continue BB -Hold amlodipine and irbesartan until hemodyanmics clearer  Non sustained V tach on telemetry - The patient had 8 beats - We'll obtain metabolites magnesium, phosphorus -  potassium within normal limits - Suspect it is secondary to principal problem list above. - Echocardiogram obtained which showed an EF of 55-60%. - If patient continues to have further episodes despite normalized metabolites will plan on consulting cardiology  DVT prophylaxis: Lovenox Code Status: Full Family Communication: discussed with  Disposition Plan: Pending improvement in condition and cessation of  nonsustained V. tach.   Consultants:   None   Procedures: None   Antimicrobials: Transition to cefazolin and 7/9   Subjective: Patient has no new complaints. Denies any chest pain.   Objective: Vitals:   03/23/17 2115 03/24/17 0242 03/24/17 0546 03/24/17 1519  BP: (!) 145/81  140/64 (!) 129/54  Pulse: 81  68 63  Resp: 18  18 20   Temp: 99 F (37.2 C)  98.7 F (37.1 C) 98.4 F (36.9 C)  TempSrc:    Oral  SpO2: 96%  98% 98%  Weight:  74.6 kg (164 lb 8 oz)    Height:        Intake/Output Summary (Last 24 hours) at 03/24/17 1747 Last data filed at 03/24/17 1351  Gross per 24 hour  Intake              210 ml  Output                0 ml  Net              210 ml   Filed Weights   03/22/17 0433 03/23/17 0608 03/24/17 0242  Weight: 72.7 kg (160 lb 3.2 oz) 73.5 kg (162 lb 1.6 oz) 74.6 kg (164 lb 8 oz)    Examination:  General exam: Patient is in no acute distress, alert and awake Respiratory system: Equal chest rise, no wheezes, no increased work of breathing Cardiovascular system:  RRR. No JVD, murmurs, rubs, gallops , S1 & S2 heard, Gastrointestinal system: Abdomen is nondistended, soft and nontender. No organomegaly or masses felt. Normal bowel sounds heard. Central nervous system: Alert and oriented. No focal neurological deficits. Extremities: Symmetric 5 x 5 power. Skin: No rashes, lesions or ulcers  on limited exam Psychiatry:  Mood & affect appropriate.   Data Reviewed: I have personally reviewed following labs and imaging studies  CBC:  Recent Labs Lab 03/22/17 0057 03/23/17 0540  WBC 14.8* 9.2  HGB 12.9 12.2  HCT 38.4 36.4  MCV 86.1 85.4  PLT 168 160   Basic Metabolic Panel:  Recent Labs Lab 03/22/17 0057 03/23/17 0540 03/24/17 0456  NA 127* 131* 132*  K 4.2 3.9 3.7  CL 97* 101 101  CO2 18* 21* 22  GLUCOSE 334* 185* 215*  BUN 16 14 10   CREATININE 0.88 0.63 0.59  CALCIUM 8.7* 8.6* 8.8*   GFR: Estimated Creatinine Clearance: 68.7  mL/min (by C-G formula based on SCr of 0.59 mg/dL). Liver Function Tests:  Recent Labs Lab 03/22/17 0529  AST 34  ALT 27  ALKPHOS 84  BILITOT 1.5*  PROT 7.5  ALBUMIN 3.3*    Recent Labs Lab 03/22/17 0057  LIPASE 29   No results for input(s): AMMONIA in the last 168 hours. Coagulation Profile: No results for input(s): INR, PROTIME in the last 168 hours. Cardiac Enzymes:  Recent Labs Lab 03/22/17 0057 03/22/17 0529  TROPONINI 0.06* 0.07*   BNP (last 3 results) No results for input(s): PROBNP in the last 8760 hours. HbA1C: No results for input(s): HGBA1C in the last 72 hours. CBG:  Recent Labs Lab 03/23/17 1656 03/23/17 2114 03/24/17 0808 03/24/17 1204 03/24/17 1709  GLUCAP 260* 234* 249* 232* 245*   Lipid Profile: No results for input(s): CHOL, HDL, LDLCALC, TRIG, CHOLHDL, LDLDIRECT in the last 72 hours. Thyroid Function Tests:  Recent Labs  03/22/17 0529  TSH 1.277   Anemia Panel: No results for input(s): VITAMINB12, FOLATE, FERRITIN, TIBC, IRON, RETICCTPCT in the last 72 hours. Sepsis Labs:  Recent Labs Lab 03/22/17 0133  LATICACIDVEN 1.63    Recent Results (from the past 240 hour(s))  Urine Culture     Status: Abnormal   Collection Time: 03/22/17  2:00 AM  Result Value Ref Range Status   Specimen Description URINE, RANDOM  Final   Special Requests NONE  Final   Culture >=100,000 COLONIES/mL ESCHERICHIA COLI (A)  Final   Report Status 03/24/2017 FINAL  Final   Organism ID, Bacteria ESCHERICHIA COLI (A)  Final      Susceptibility   Escherichia coli - MIC*    AMPICILLIN <=2 SENSITIVE Sensitive     CEFAZOLIN <=4 SENSITIVE Sensitive     CEFTRIAXONE <=1 SENSITIVE Sensitive     CIPROFLOXACIN <=0.25 SENSITIVE Sensitive     GENTAMICIN <=1 SENSITIVE Sensitive     IMIPENEM <=0.25 SENSITIVE Sensitive     NITROFURANTOIN <=16 SENSITIVE Sensitive     TRIMETH/SULFA <=20 SENSITIVE Sensitive     AMPICILLIN/SULBACTAM <=2 SENSITIVE Sensitive      PIP/TAZO <=4 SENSITIVE Sensitive     Extended ESBL NEGATIVE Sensitive     * >=100,000 COLONIES/mL ESCHERICHIA COLI  Culture, blood (routine x 2)     Status: Abnormal (Preliminary result)   Collection Time: 03/22/17  5:29 AM  Result Value Ref Range Status   Specimen Description BLOOD RIGHT ANTECUBITAL  Final   Special Requests   Final    BOTTLES DRAWN AEROBIC AND ANAEROBIC Blood Culture adequate volume   Culture  Setup Time   Final    AEROBIC BOTTLE ONLY GRAM NEGATIVE RODS CRITICAL RESULT CALLED TO, READ BACK BY AND VERIFIED WITH: TO KCOOK(PHARMd) by [email or web address] 03/23/2017 at 2:47am    Culture ESCHERICHIA COLI SUSCEPTIBILITIES TO FOLLOW  (A)  Final   Report Status PENDING  Incomplete  Blood Culture ID Panel (Reflexed)     Status: Abnormal   Collection Time: 03/22/17  5:29 AM  Result Value Ref Range Status   Enterococcus species NOT DETECTED NOT DETECTED Final   Listeria monocytogenes NOT DETECTED NOT DETECTED Final   Staphylococcus species NOT DETECTED NOT DETECTED Final   Staphylococcus aureus NOT DETECTED NOT DETECTED Final   Streptococcus species NOT DETECTED NOT DETECTED Final   Streptococcus agalactiae NOT DETECTED NOT DETECTED Final   Streptococcus pneumoniae NOT DETECTED NOT DETECTED Final   Streptococcus pyogenes NOT DETECTED NOT DETECTED Final   Acinetobacter baumannii NOT DETECTED NOT DETECTED Final   Enterobacteriaceae species DETECTED (A) NOT DETECTED Final    Comment: Enterobacteriaceae represent a large family of gram-negative bacteria, not a single organism. CRITICAL RESULT CALLED TO, READ BACK BY AND VERIFIED WITH: TO KCOOK(PHARMd) by [facility] 03/23/2017 AT 2:43AM    Enterobacter cloacae complex NOT DETECTED NOT DETECTED Final   Escherichia coli DETECTED (A) NOT DETECTED Final    Comment: CRITICAL RESULT CALLED TO, READ BACK BY AND VERIFIED WITH: TO KCOOK(PHARMd) by [facility] 03/23/2017 AT 2:43AM    Klebsiella oxytoca NOT DETECTED NOT DETECTED Final   Klebsiella  pneumoniae NOT DETECTED NOT DETECTED Final   Proteus species NOT DETECTED NOT DETECTED Final   Serratia marcescens NOT DETECTED NOT DETECTED Final   Carbapenem resistance NOT DETECTED NOT DETECTED Final   Haemophilus influenzae NOT DETECTED NOT DETECTED Final   Neisseria meningitidis NOT DETECTED NOT DETECTED Final   Pseudomonas aeruginosa NOT DETECTED NOT DETECTED Final   Candida albicans NOT DETECTED NOT DETECTED Final   Candida glabrata NOT DETECTED NOT DETECTED Final   Candida krusei NOT DETECTED NOT DETECTED Final   Candida parapsilosis NOT DETECTED NOT DETECTED Final   Candida tropicalis NOT DETECTED NOT DETECTED Final  Culture, blood (routine x 2)     Status: None (Preliminary result)   Collection Time: 03/22/17  5:34 AM  Result Value Ref Range Status   Specimen Description BLOOD LEFT HAND  Final   Special Requests IN PEDIATRIC BOTTLE Blood Culture adequate volume  Final   Culture NO GROWTH 2 DAYS  Final   Report Status PENDING  Incomplete  Gastrointestinal Panel by PCR , Stool     Status: None   Collection Time: 03/22/17  5:47 PM  Result Value Ref Range Status   Campylobacter species NOT DETECTED NOT DETECTED Final   Plesimonas shigelloides NOT DETECTED NOT DETECTED Final   Salmonella species NOT DETECTED NOT DETECTED Final   Yersinia enterocolitica NOT DETECTED NOT DETECTED Final   Vibrio species NOT DETECTED NOT DETECTED Final   Vibrio cholerae NOT DETECTED NOT DETECTED Final   Enteroaggregative E coli (EAEC) NOT DETECTED NOT DETECTED Final   Enteropathogenic E coli (EPEC) NOT DETECTED NOT DETECTED Final   Enterotoxigenic E coli (ETEC) NOT DETECTED NOT DETECTED Final   Shiga like toxin producing E coli (STEC) NOT DETECTED NOT DETECTED Final   Shigella/Enteroinvasive E coli (EIEC) NOT DETECTED NOT DETECTED Final   Cryptosporidium NOT DETECTED NOT DETECTED Final   Cyclospora cayetanensis NOT DETECTED NOT DETECTED Final   Entamoeba histolytica NOT DETECTED NOT DETECTED Final    Giardia lamblia NOT DETECTED NOT DETECTED Final   Adenovirus F40/41 NOT DETECTED NOT DETECTED Final   Astrovirus NOT DETECTED NOT DETECTED Final   Norovirus GI/GII NOT DETECTED NOT DETECTED Final   Rotavirus A NOT DETECTED NOT DETECTED Final   Sapovirus (I, II, IV, and  V) NOT DETECTED NOT DETECTED Final         Radiology Studies: No results found.   Scheduled Meds: . carvedilol  25 mg Oral BID  . enoxaparin (LOVENOX) injection  40 mg Subcutaneous Q24H  . insulin aspart  0-15 Units Subcutaneous TID WC  . insulin aspart  0-5 Units Subcutaneous QHS  . insulin glargine  15 Units Subcutaneous Daily   Continuous Infusions: .  ceFAZolin (ANCEF) IV Stopped (03/24/17 1421)     LOS: 1 day    Time spent: > 35 minutes  Penny Pia, MD [facility] Pager [phone]  If 7PM-7AM, please contact night-coverage [email or web address] Password [facility] 03/24/2017, 5:47 PM

## 2017-03-24 NOTE — Progress Notes (Signed)
Inpatient Diabetes Program Recommendations  AACE/ADA: New Consensus Statement on Inpatient Glycemic Control (2015)  Target Ranges:  Prepandial:   less than 140 mg/dL      Peak postprandial:   less than 180 mg/dL (1-2 hours)      Critically ill patients:  140 - 180 mg/dL   Results for LORENA, BENHAM (MRN [id number]) as of 03/24/2017 11:44  Ref. Range 03/23/2017 07:44 03/23/2017 12:27 03/23/2017 16:56 03/23/2017 21:14 03/24/2017 08:08  Glucose-Capillary Latest Ref Range: 65 - 99 mg/dL 762 (H) 831 (H) 517 (H) 234 (H) 249 (H)   Review of Glycemic Control  Diabetes history: DM2 Outpatient Diabetes medications: Invokamet [phone] mg BID, Novolog 16 units with lunch and supper, Toujeo 42 units daily, Victoza 1.8 mg daily Current orders for Inpatient glycemic control: Lantus 15 units daily, Novolog 0-15 units TID with meals, Novolog 0-5 units QHS  Inpatient Diabetes Program Recommendations: Insulin - Basal: Please consider increasing Lantus to 17 units daily. Insulin - Meal Coverage: Please consider ordering Novolog 5 units TID with meals for meal coverage if patient eats at least 50% of meals.  Thanks, Orlando Penner, RN, MSN, CDE Diabetes Coordinator Inpatient Diabetes Program [phone] (Team Pager from 8am to 5pm)

## 2017-03-24 NOTE — Progress Notes (Addendum)
MD was notified about patient having 8 beats of V-tach per [employer]. Patient is asymptomatic. Will continue to monitor.

## 2017-03-25 ENCOUNTER — Other Ambulatory Visit: Payer: Federal, State, Local not specified - PPO

## 2017-03-25 LAB — GLUCOSE, CAPILLARY
Glucose-Capillary: 226 mg/dL — ABNORMAL HIGH (ref 65–99)
Glucose-Capillary: 222 mg/dL — ABNORMAL HIGH (ref 65–99)
Glucose-Capillary: 180 mg/dL — ABNORMAL HIGH (ref 65–99)
Glucose-Capillary: 208 mg/dL — ABNORMAL HIGH (ref 65–99)

## 2017-03-25 LAB — BASIC METABOLIC PANEL
Anion gap: 6 (ref 5–15)
BUN: 8 mg/dL (ref 6–20)
CO2: 27 mmol/L (ref 22–32)
Calcium: 8.8 mg/dL — ABNORMAL LOW (ref 8.9–10.3)
Chloride: 102 mmol/L (ref 101–111)
Creatinine, Ser: 0.6 mg/dL (ref 0.44–1.00)
GFR calc Af Amer: >60 mL/min (ref >60)
GFR calc non Af Amer: >60 mL/min (ref >60)
Glucose, Bld: 219 mg/dL — ABNORMAL HIGH (ref 65–99)
Potassium: 4.2 mmol/L (ref 3.5–5.1)
Sodium: 135 mmol/L (ref 135–145)

## 2017-03-25 LAB — CULTURE, BLOOD (ROUTINE X 2): Special Requests: ADEQUATE

## 2017-03-25 MED ORDER — POTASSIUM PHOSPHATE MONOBASIC 500 MG PO TABS
500.0000 mg | ORAL_TABLET | Freq: Once | ORAL | Status: DC
  Filled 2017-03-25: qty 1

## 2017-03-25 MED ORDER — INSULIN GLARGINE 100 UNIT/ML ~~LOC~~ SOLN
20.0000 [IU] | Freq: Every day | SUBCUTANEOUS | Status: DC
  Administered 2017-03-26: 20 [IU] via SUBCUTANEOUS
  Filled 2017-03-25: qty 0.2

## 2017-03-25 MED ORDER — K PHOS MONO-SOD PHOS DI & MONO 155-852-130 MG PO TABS
500.0000 mg | ORAL_TABLET | Freq: Once | ORAL | Status: AC
  Administered 2017-03-25: 500 mg via ORAL
  Filled 2017-03-25: qty 2

## 2017-03-25 NOTE — Progress Notes (Signed)
Results for ZANAE, KUEHNLE (MRN [id number]) as of 03/25/2017 13:07  Ref. Range 03/24/2017 12:04 03/24/2017 17:09 03/24/2017 21:28 03/25/2017 08:05 03/25/2017 12:22  Glucose-Capillary Latest Ref Range: 65 - 99 mg/dL 176 (H) 160 (H) 737 (H) 226 (H) 222 (H)  Noted that blood sugars continue to be greater than 200 mg/dl. Recommend increasing Lantus to 20 units daily.  May need to add Novolog 3-4 units TID with meals if eating at least 50% of meal if blood sugars continue to be greater than 180 mg/dl.   Smith Mince RN BSN CDE Diabetes Coordinator Pager: [phone]  8am-5pm

## 2017-03-25 NOTE — Progress Notes (Signed)
PROGRESS NOTE    Linda Lowe  TWS:[id number] DOB: 1951/09/01 DOA: 03/22/2017 PCP: Renaye Rakers, MD    Brief Narrative:   66 y.o. female with a past medical history significant for non-isch CHF EF 35%, HTN, and IDDM who presents with 1 week malaise, diarrhea, and fever.  Pt had positive urine culture and blood culture. Awaiting sensitivities currently. Patient had nonsustained V. Tach which resolved.  Assessment & Plan:   Sepsis -Narrowing antibiotic coverage. Discussed with pharmacist. Most likely urinary source. Awaiting sensitivities. Urine culture Escherichia coli was pansensitive.  Gram-negative bacteremia - Blood culture pending. Growing Escherichia coli sensitivities pending. Most likely source urinary.  Diarrhea - negative GI pathogen panel - Discontinued Flagyl - no complaints of diarrhea reported to me by patient.   Hyponatremia:  - resolved. Suspected to be due to poor oral solute intake. Resolved with IV fluid rehydration and improved oral intake.   Elevated troponin:  -Trend troponin, no chest pain reported. -Echo and reported normal EF of 55-60%   Chronic systolic and diastolic CHF:  EF 35% previously.  NICM. -Hold furosemide for now -Hold ACEi for now -Continue BB   Non-anion gap acidosis:  From diarrhea.   Insulin-dependent diabetes:  -Hold metformin, SGLT2 inhib.   -Lantus increased to 20 units daily as per diabetic coordinator recommendations. -SSI   Hypertension:  -Continue BB, may consider continuing other home blood pressure medication regimen if blood pressures remain elevated. Currently relatively well controlled on beta blocker  Non sustained V tach on telemetry - The patient had 8 beats - We'll obtain metabolites magnesium, phosphorus -  potassium within normal limits - Suspect it is secondary to principal problem list above. - Echocardiogram obtained which showed an EF of 55-60%. - Resolved and has not had any more episodes in  morin 24 hours will discontinue telemetry. Phosphorus mildly low as such will replace orally 1 and reassess next a.m. - Patient is on beta blocker  Hypophosphatemia - replace and reassess next am.  DVT prophylaxis: Lovenox Code Status: Full Family Communication: Discussed with patient directly. Was discussed with spouse yesterday. Disposition Plan: Awaiting final blood culture sensitivities   Consultants:   None   Procedures: None   Antimicrobials: Transition to cefazolin and 7/9   Subjective: Patient has no new complaints. No acute issues overnight reported.  Objective: Vitals:   03/24/17 2128 03/25/17 0500 03/25/17 0521 03/25/17 1418  BP: (!) 141/57  (!) 153/67 (!) 143/84  Pulse: 64  65 60  Resp: 18  17 17   Temp:   97.7 F (36.5 C) 98 F (36.7 C)  TempSrc:      SpO2: 99%  97% 98%  Weight:  74 kg (163 lb 1.6 oz)    Height:       No intake or output data in the 24 hours ending 03/25/17 1854 Filed Weights   03/23/17 0608 03/24/17 0242 03/25/17 0500  Weight: 73.5 kg (162 lb 1.6 oz) 74.6 kg (164 lb 8 oz) 74 kg (163 lb 1.6 oz)    Examination:Physical exam unchanged from 03/24/2017  General exam: Patient is in no acute distress, alert and awake Respiratory system: Equal chest rise, no wheezes, no increased work of breathing Cardiovascular system:  RRR. No JVD, murmurs, rubs, gallops , S1 & S2 heard, Gastrointestinal system: Abdomen is nondistended, soft and nontender. No organomegaly or masses felt. Normal bowel sounds heard. Central nervous system: Alert and oriented. No focal neurological deficits. Extremities: Symmetric 5 x 5 power. Skin: No rashes, lesions  or ulcers on limited exam Psychiatry:  Mood & affect appropriate.   Data Reviewed: I have personally reviewed following labs and imaging studies  CBC:  Recent Labs Lab 03/22/17 0057 03/23/17 0540  WBC 14.8* 9.2  HGB 12.9 12.2  HCT 38.4 36.4  MCV 86.1 85.4  PLT 168 160   Basic Metabolic  Panel:  Recent Labs Lab 03/22/17 0057 03/23/17 0540 03/24/17 0456 03/24/17 1805 03/25/17 0444  NA 127* 131* 132*  --  135  K 4.2 3.9 3.7  --  4.2  CL 97* 101 101  --  102  CO2 18* 21* 22  --  27  GLUCOSE 334* 185* 215*  --  219*  BUN 16 14 10   --  8  CREATININE 0.88 0.63 0.59  --  0.60  CALCIUM 8.7* 8.6* 8.8*  --  8.8*  MG  --   --   --  1.9  --   PHOS  --   --   --  2.0*  --    GFR: Estimated Creatinine Clearance: 68.4 mL/min (by C-G formula based on SCr of 0.6 mg/dL). Liver Function Tests:  Recent Labs Lab 03/22/17 0529  AST 34  ALT 27  ALKPHOS 84  BILITOT 1.5*  PROT 7.5  ALBUMIN 3.3*    Recent Labs Lab 03/22/17 0057  LIPASE 29   No results for input(s): AMMONIA in the last 168 hours. Coagulation Profile: No results for input(s): INR, PROTIME in the last 168 hours. Cardiac Enzymes:  Recent Labs Lab 03/22/17 0057 03/22/17 0529  TROPONINI 0.06* 0.07*   BNP (last 3 results) No results for input(s): PROBNP in the last 8760 hours. HbA1C: No results for input(s): HGBA1C in the last 72 hours. CBG:  Recent Labs Lab 03/24/17 1709 03/24/17 2128 03/25/17 0805 03/25/17 1222 03/25/17 1708  GLUCAP 245* 195* 226* 222* 180*   Lipid Profile: No results for input(s): CHOL, HDL, LDLCALC, TRIG, CHOLHDL, LDLDIRECT in the last 72 hours. Thyroid Function Tests: No results for input(s): TSH, T4TOTAL, FREET4, T3FREE, THYROIDAB in the last 72 hours. Anemia Panel: No results for input(s): VITAMINB12, FOLATE, FERRITIN, TIBC, IRON, RETICCTPCT in the last 72 hours. Sepsis Labs:  Recent Labs Lab 03/22/17 0133  LATICACIDVEN 1.63    Recent Results (from the past 240 hour(s))  Urine Culture     Status: Abnormal   Collection Time: 03/22/17  2:00 AM  Result Value Ref Range Status   Specimen Description URINE, RANDOM  Final   Special Requests NONE  Final   Culture >=100,000 COLONIES/mL ESCHERICHIA COLI (A)  Final   Report Status 03/24/2017 FINAL  Final   Organism  ID, Bacteria ESCHERICHIA COLI (A)  Final      Susceptibility   Escherichia coli - MIC*    AMPICILLIN <=2 SENSITIVE Sensitive     CEFAZOLIN <=4 SENSITIVE Sensitive     CEFTRIAXONE <=1 SENSITIVE Sensitive     CIPROFLOXACIN <=0.25 SENSITIVE Sensitive     GENTAMICIN <=1 SENSITIVE Sensitive     IMIPENEM <=0.25 SENSITIVE Sensitive     NITROFURANTOIN <=16 SENSITIVE Sensitive     TRIMETH/SULFA <=20 SENSITIVE Sensitive     AMPICILLIN/SULBACTAM <=2 SENSITIVE Sensitive     PIP/TAZO <=4 SENSITIVE Sensitive     Extended ESBL NEGATIVE Sensitive     * >=100,000 COLONIES/mL ESCHERICHIA COLI  Culture, blood (routine x 2)     Status: Abnormal   Collection Time: 03/22/17  5:29 AM  Result Value Ref Range Status   Specimen Description BLOOD RIGHT  ANTECUBITAL  Final   Special Requests   Final    BOTTLES DRAWN AEROBIC AND ANAEROBIC Blood Culture adequate volume   Culture  Setup Time   Final    AEROBIC BOTTLE ONLY GRAM NEGATIVE RODS CRITICAL RESULT CALLED TO, READ BACK BY AND VERIFIED WITH: TO KCOOK(PHARMd) by [email or web address] 03/23/2017 at 2:47am    Culture ESCHERICHIA COLI (A)  Final   Report Status 03/25/2017 FINAL  Final   Organism ID, Bacteria ESCHERICHIA COLI  Final      Susceptibility   Escherichia coli - MIC*    AMPICILLIN <=2 SENSITIVE Sensitive     CEFAZOLIN <=4 SENSITIVE Sensitive     CEFEPIME <=1 SENSITIVE Sensitive     CEFTAZIDIME <=1 SENSITIVE Sensitive     CEFTRIAXONE <=1 SENSITIVE Sensitive     CIPROFLOXACIN <=0.25 SENSITIVE Sensitive     GENTAMICIN <=1 SENSITIVE Sensitive     IMIPENEM <=0.25 SENSITIVE Sensitive     TRIMETH/SULFA <=20 SENSITIVE Sensitive     AMPICILLIN/SULBACTAM <=2 SENSITIVE Sensitive     PIP/TAZO <=4 SENSITIVE Sensitive     Extended ESBL NEGATIVE Sensitive     * ESCHERICHIA COLI  Blood Culture ID Panel (Reflexed)     Status: Abnormal   Collection Time: 03/22/17  5:29 AM  Result Value Ref Range Status   Enterococcus species NOT DETECTED NOT DETECTED Final   Listeria  monocytogenes NOT DETECTED NOT DETECTED Final   Staphylococcus species NOT DETECTED NOT DETECTED Final   Staphylococcus aureus NOT DETECTED NOT DETECTED Final   Streptococcus species NOT DETECTED NOT DETECTED Final   Streptococcus agalactiae NOT DETECTED NOT DETECTED Final   Streptococcus pneumoniae NOT DETECTED NOT DETECTED Final   Streptococcus pyogenes NOT DETECTED NOT DETECTED Final   Acinetobacter baumannii NOT DETECTED NOT DETECTED Final   Enterobacteriaceae species DETECTED (A) NOT DETECTED Final    Comment: Enterobacteriaceae represent a large family of gram-negative bacteria, not a single organism. CRITICAL RESULT CALLED TO, READ BACK BY AND VERIFIED WITH: TO KCOOK(PHARMd) by [facility] 03/23/2017 AT 2:43AM    Enterobacter cloacae complex NOT DETECTED NOT DETECTED Final   Escherichia coli DETECTED (A) NOT DETECTED Final    Comment: CRITICAL RESULT CALLED TO, READ BACK BY AND VERIFIED WITH: TO KCOOK(PHARMd) by [facility] 03/23/2017 AT 2:43AM    Klebsiella oxytoca NOT DETECTED NOT DETECTED Final   Klebsiella pneumoniae NOT DETECTED NOT DETECTED Final   Proteus species NOT DETECTED NOT DETECTED Final   Serratia marcescens NOT DETECTED NOT DETECTED Final   Carbapenem resistance NOT DETECTED NOT DETECTED Final   Haemophilus influenzae NOT DETECTED NOT DETECTED Final   Neisseria meningitidis NOT DETECTED NOT DETECTED Final   Pseudomonas aeruginosa NOT DETECTED NOT DETECTED Final   Candida albicans NOT DETECTED NOT DETECTED Final   Candida glabrata NOT DETECTED NOT DETECTED Final   Candida krusei NOT DETECTED NOT DETECTED Final   Candida parapsilosis NOT DETECTED NOT DETECTED Final   Candida tropicalis NOT DETECTED NOT DETECTED Final  Culture, blood (routine x 2)     Status: None (Preliminary result)   Collection Time: 03/22/17  5:34 AM  Result Value Ref Range Status   Specimen Description BLOOD LEFT HAND  Final   Special Requests IN PEDIATRIC BOTTLE Blood Culture adequate volume   Final   Culture NO GROWTH 3 DAYS  Final   Report Status PENDING  Incomplete  Gastrointestinal Panel by PCR , Stool     Status: None   Collection Time: 03/22/17  5:47 PM  Result Value Ref Range Status  Campylobacter species NOT DETECTED NOT DETECTED Final   Plesimonas shigelloides NOT DETECTED NOT DETECTED Final   Salmonella species NOT DETECTED NOT DETECTED Final   Yersinia enterocolitica NOT DETECTED NOT DETECTED Final   Vibrio species NOT DETECTED NOT DETECTED Final   Vibrio cholerae NOT DETECTED NOT DETECTED Final   Enteroaggregative E coli (EAEC) NOT DETECTED NOT DETECTED Final   Enteropathogenic E coli (EPEC) NOT DETECTED NOT DETECTED Final   Enterotoxigenic E coli (ETEC) NOT DETECTED NOT DETECTED Final   Shiga like toxin producing E coli (STEC) NOT DETECTED NOT DETECTED Final   Shigella/Enteroinvasive E coli (EIEC) NOT DETECTED NOT DETECTED Final   Cryptosporidium NOT DETECTED NOT DETECTED Final   Cyclospora cayetanensis NOT DETECTED NOT DETECTED Final   Entamoeba histolytica NOT DETECTED NOT DETECTED Final   Giardia lamblia NOT DETECTED NOT DETECTED Final   Adenovirus F40/41 NOT DETECTED NOT DETECTED Final   Astrovirus NOT DETECTED NOT DETECTED Final   Norovirus GI/GII NOT DETECTED NOT DETECTED Final   Rotavirus A NOT DETECTED NOT DETECTED Final   Sapovirus (I, II, IV, and V) NOT DETECTED NOT DETECTED Final         Radiology Studies: No results found.   Scheduled Meds: . carvedilol  25 mg Oral BID  . enoxaparin (LOVENOX) injection  40 mg Subcutaneous Q24H  . insulin aspart  0-15 Units Subcutaneous TID WC  . insulin aspart  0-5 Units Subcutaneous QHS  . insulin glargine  15 Units Subcutaneous Daily   Continuous Infusions: .  ceFAZolin (ANCEF) IV Stopped (03/25/17 1317)     LOS: 2 days    Time spent: > 35 minutes  Penny Pia, MD [facility] Pager [phone]  If 7PM-7AM, please contact night-coverage [email or web address] Password [facility] 03/25/2017,  6:54 PM

## 2017-03-26 DIAGNOSIS — I5022 Chronic systolic (congestive) heart failure: Secondary | ICD-10-CM

## 2017-03-26 DIAGNOSIS — I10 Essential (primary) hypertension: Secondary | ICD-10-CM

## 2017-03-26 DIAGNOSIS — N390 Urinary tract infection, site not specified: Secondary | ICD-10-CM

## 2017-03-26 DIAGNOSIS — A4151 Sepsis due to Escherichia coli [E. coli]: Secondary | ICD-10-CM

## 2017-03-26 DIAGNOSIS — Z794 Long term (current) use of insulin: Secondary | ICD-10-CM

## 2017-03-26 DIAGNOSIS — E1165 Type 2 diabetes mellitus with hyperglycemia: Secondary | ICD-10-CM

## 2017-03-26 LAB — CBC
HCT: 37.9 % (ref 36.0–46.0)
Hemoglobin: 12.3 g/dL (ref 12.0–15.0)
MCH: 28 pg (ref 26.0–34.0)
MCHC: 32.5 g/dL (ref 30.0–36.0)
MCV: 86.3 fL (ref 78.0–100.0)
Platelets: 254 10*3/uL (ref 150–400)
RBC: 4.39 MIL/uL (ref 3.87–5.11)
RDW: 13.5 % (ref 11.5–15.5)
WBC: 5.8 10*3/uL (ref 4.0–10.5)

## 2017-03-26 LAB — BASIC METABOLIC PANEL
Anion gap: 8 (ref 5–15)
BUN: 7 mg/dL (ref 6–20)
CO2: 24 mmol/L (ref 22–32)
Calcium: 8.9 mg/dL (ref 8.9–10.3)
Chloride: 106 mmol/L (ref 101–111)
Creatinine, Ser: 0.5 mg/dL (ref 0.44–1.00)
GFR calc Af Amer: >60 mL/min (ref >60)
GFR calc non Af Amer: >60 mL/min (ref >60)
Glucose, Bld: 232 mg/dL — ABNORMAL HIGH (ref 65–99)
Potassium: 3.9 mmol/L (ref 3.5–5.1)
Sodium: 138 mmol/L (ref 135–145)

## 2017-03-26 LAB — GLUCOSE, CAPILLARY
Glucose-Capillary: 226 mg/dL — ABNORMAL HIGH (ref 65–99)
Glucose-Capillary: 264 mg/dL — ABNORMAL HIGH (ref 65–99)

## 2017-03-26 LAB — PHOSPHORUS: Phosphorus: 3.1 mg/dL (ref 2.5–4.6)

## 2017-03-26 MED ORDER — AMOXICILLIN-POT CLAVULANATE 875-125 MG PO TABS
1.0000 | ORAL_TABLET | Freq: Two times a day (BID) | ORAL | Status: DC
  Administered 2017-03-26: 1 via ORAL
  Filled 2017-03-26 (×2): qty 1

## 2017-03-26 MED ORDER — AMOXICILLIN-POT CLAVULANATE 875-125 MG PO TABS: 1.0000 | ORAL_TABLET | Freq: Two times a day (BID) | ORAL | 0 refills | Status: DC

## 2017-03-26 NOTE — Progress Notes (Signed)
Pt. Wanted to know can she get a dose of augmentin before she leaves, because husband will not be able to get to pharmacy until 4 this evening. Text paged Dr. Elvera Lennox to see.  Will continue to follow and await for new orders or return call.  Forbes Cellar, RN

## 2017-03-26 NOTE — Consult Note (Signed)
[facility] CM Primary Care Navigator  03/26/2017  Linda Lowe May 17, 1951 [id number]   Met with patient and husband Linda Lowe) at the bedside to identify possible discharge needs. Patient reports having diarrhea, confusion, fever, chills, weakness and headache that had led to this admission.  Patient endorses Dr. Lucianne Lei with [facility] as the primary care provider.    Patient shared using [facility] and [place] obtain medications without difficulty.   Patient reports managing her own medications at home straight out of the containers but uses "pill box" system when travelling.  Patientreports that she drives prior to admission, however, her sisters in-law Linda Lowe and Linda Lowe) will be able to provide transportationto herdoctors'appointments after discharge since husband does not drive.  Patient's husband will be her primary caregiver at home as stated.  Anticipated discharge plan is home per patient.  Patient and husband voiced understanding to follow-up withprimary care providerwhen she returns home,for a post discharge follow-up visit within a week or sooner if needs arise.Patient letter (with PCP's contact number) was provided as a reminder. Husband mentioned that patient has a scheduled follow-up with primary care provider on 7/25 and was encouraged to keep and attend this appointment.  Explained to patient about [facility] CM services available for further healthmanagement and patientdenies any current needs or concerns at this time.  She verbalized knowledge on how to manage DM at home and reports awareness on ways to manage HF as well. She states following-up with primary care provider, endocrinologist (Dr. Dwyane Dee) and cardiologist (Dr. Woody Seller) when needed. Patient also mentioned that she is remotely monitored by cardiologist office for HF and being contacted for further instructions.  Patientvoiced understanding to  seekreferral to [facility] care managementservicesfrom primary care provider if deemed necessaryin thefuture.  [facility] care management information provided for future needs that may arise.  Patient had opted and verbally agreed to [place] to follow her up at home as she recovers.  Referral to [place] calls made.   For questions, please contact:  Dannielle Huh, BSN, RN- [facility] Primary Care Navigator  Telephone: [phone] [place]

## 2017-03-26 NOTE — Progress Notes (Signed)
Linda Lowe discharged Home per MD order.  Discharge instructions reviewed and discussed with the patient, all questions and concerns answered. Copy of instructions and scripts given to patient.  Allergies as of 03/26/2017   No Known Allergies     Medication List    TAKE these medications   amLODipine 5 MG tablet Commonly known as:  NORVASC TK 1 T PO QD   amoxicillin-clavulanate 875-125 MG tablet Commonly known as:  AUGMENTIN Take 1 tablet by mouth 2 (two) times daily.   atorvastatin 40 MG tablet Commonly known as:  LIPITOR TAKE 1 TABLET(40 MG) BY MOUTH DAILY   Canagliflozin-Metformin HCl [phone] MG Tabs Commonly known as:  INVOKAMET Take 1 tablet by mouth 2 (two) times daily.   carvedilol 25 MG tablet Commonly known as:  COREG Take 25 mg by mouth 2 (two) times daily.   EPIPEN 0.3 mg/0.3 mL Devi Generic drug:  EPINEPHrine Inject 0.3 mg into the muscle as needed (for allergic reaction). For allergic reactions   furosemide 20 MG tablet Commonly known as:  LASIX Take 20 mg by mouth 2 (two) times daily.   glucose blood test strip Commonly known as:  ONETOUCH VERIO Use to test blood sugar 3 times daily   Insulin Pen Needle 31G X 5 MM Misc Commonly known as:  B-D UF III MINI PEN NEEDLES USE 5 PEN NEEDLES PER DAY AS INSTRUCTED   B-D UF III MINI PEN NEEDLES 31G X 5 MM Misc Generic drug:  Insulin Pen Needle USE 5 NEEDLES EVERY DAY AS DIRECTED   irbesartan 300 MG tablet Commonly known as:  AVAPRO Take 300 mg by mouth daily.   naproxen 500 MG tablet Commonly known as:  NAPROSYN Take 500 mg by mouth 2 (two) times daily as needed for mild pain.   NOVOLOG FLEXPEN 100 UNIT/ML FlexPen Generic drug:  insulin aspart ADMINISTER 16 UNITS UNDER THE SKIN TWICE DAILY BEFORE LUNCH AND SUPPER   ONETOUCH DELICA LANCETS FINE Misc USE TO TEST BLOOD SUGAR 3 TIMES DAILY   oxybutynin 5 MG 24 hr tablet Commonly known as:  DITROPAN-XL Take 5 mg by mouth daily.   potassium chloride  SA 20 MEQ tablet Commonly known as:  K-DUR,KLOR-CON TAKE 1 TABLET(20 MEQ) BY MOUTH DAILY   TOUJEO SOLOSTAR 300 UNIT/ML Sopn Generic drug:  Insulin Glargine INJECT 40 UNITS INTO THE SKIN DAILY What changed:  how much to take   VICTOZA 18 MG/3ML Sopn Generic drug:  liraglutide INJECT 1.8 MG UNDER THE SKIN DAILY       Patients skin is clean, dry and intact, no evidence of skin break down. IV site discontinued and catheter remains intact. Site without signs and symptoms of complications. Dressing and pressure applied.  Patient escorted to car by NT in a wheelchair,  no distress noted upon discharge.  Laural Benes, Maryjayne Kleven C 03/26/2017 2:18 PM  Linda Lowe discharged Home with husband per MD order.  Discharge instructions reviewed and discussed with the patient, all questions and concerns answered. Copy of instructions, care notes and scripts given to patient.  Also faxed prescription to pt. Pharmacy at their request.  Allergies as of 03/26/2017   No Known Allergies     Medication List    TAKE these medications   amLODipine 5 MG tablet Commonly known as:  NORVASC TK 1 T PO QD   amoxicillin-clavulanate 875-125 MG tablet Commonly known as:  AUGMENTIN Take 1 tablet by mouth 2 (two) times daily.   atorvastatin 40 MG tablet Commonly  known as:  LIPITOR TAKE 1 TABLET(40 MG) BY MOUTH DAILY   Canagliflozin-Metformin HCl [phone] MG Tabs Commonly known as:  INVOKAMET Take 1 tablet by mouth 2 (two) times daily.   carvedilol 25 MG tablet Commonly known as:  COREG Take 25 mg by mouth 2 (two) times daily.   EPIPEN 0.3 mg/0.3 mL Devi Generic drug:  EPINEPHrine Inject 0.3 mg into the muscle as needed (for allergic reaction). For allergic reactions   furosemide 20 MG tablet Commonly known as:  LASIX Take 20 mg by mouth 2 (two) times daily.   glucose blood test strip Commonly known as:  ONETOUCH VERIO Use to test blood sugar 3 times daily   Insulin Pen Needle 31G X 5 MM  Misc Commonly known as:  B-D UF III MINI PEN NEEDLES USE 5 PEN NEEDLES PER DAY AS INSTRUCTED   B-D UF III MINI PEN NEEDLES 31G X 5 MM Misc Generic drug:  Insulin Pen Needle USE 5 NEEDLES EVERY DAY AS DIRECTED   irbesartan 300 MG tablet Commonly known as:  AVAPRO Take 300 mg by mouth daily.   naproxen 500 MG tablet Commonly known as:  NAPROSYN Take 500 mg by mouth 2 (two) times daily as needed for mild pain.   NOVOLOG FLEXPEN 100 UNIT/ML FlexPen Generic drug:  insulin aspart ADMINISTER 16 UNITS UNDER THE SKIN TWICE DAILY BEFORE LUNCH AND SUPPER   ONETOUCH DELICA LANCETS FINE Misc USE TO TEST BLOOD SUGAR 3 TIMES DAILY   oxybutynin 5 MG 24 hr tablet Commonly known as:  DITROPAN-XL Take 5 mg by mouth daily.   potassium chloride SA 20 MEQ tablet Commonly known as:  K-DUR,KLOR-CON TAKE 1 TABLET(20 MEQ) BY MOUTH DAILY   TOUJEO SOLOSTAR 300 UNIT/ML Sopn Generic drug:  Insulin Glargine INJECT 40 UNITS INTO THE SKIN DAILY What changed:  how much to take   VICTOZA 18 MG/3ML Sopn Generic drug:  liraglutide INJECT 1.8 MG UNDER THE SKIN DAILY       Patients skin is clean, dry and intact, no evidence of skin break down. IV site discontinued and catheter remains intact. Site without signs and symptoms of complications. Dressing and pressure applied.  Patient escorted to car by NT in a wheelchair,  no distress noted upon discharge.  Laural Benes, Ebrima Ranta C 03/26/2017 2:19 PM

## 2017-03-26 NOTE — Discharge Instructions (Signed)
Follow with Lucianne Lei, MD in 5-7 days  Please get a complete blood count and chemistry panel checked by your Primary MD at your next visit, and again as instructed by your Primary MD. Please get your medications reviewed and adjusted by your Primary MD.  Please request your Primary MD to go over all Hospital Tests and Procedure/Radiological results at the follow up, please get all Hospital records sent to your Prim MD by signing hospital release before you go home.  If you had Pneumonia of Lung problems at the Hospital: Please get a 2 view Chest X ray done in 6-8 weeks after hospital discharge or sooner if instructed by your Primary MD.  If you have Congestive Heart Failure: Please call your Cardiologist or Primary MD anytime you have any of the following symptoms:  1) 3 pound weight gain in 24 hours or 5 pounds in 1 week  2) shortness of breath, with or without a dry hacking cough  3) swelling in the hands, feet or stomach  4) if you have to sleep on extra pillows at night in order to breathe  Follow cardiac low salt diet and 1.5 lit/day fluid restriction.  If you have diabetes Accuchecks 4 times/day, Once in AM empty stomach and then before each meal. Log in all results and show them to your primary doctor at your next visit. If any glucose reading is under 80 or above 300 call your primary MD immediately.  If you have Seizure/Convulsions/Epilepsy: Please do not drive, operate heavy machinery, participate in activities at heights or participate in high speed sports until you have seen by Primary MD or a Neurologist and advised to do so again.  If you had Gastrointestinal Bleeding: Please ask your Primary MD to check a complete blood count within one week of discharge or at your next visit. Your endoscopic/colonoscopic biopsies that are pending at the time of discharge, will also need to followed by your Primary MD.  Get Medicines reviewed and adjusted. Please take all your  medications with you for your next visit with your Primary MD  Please request your Primary MD to go over all hospital tests and procedure/radiological results at the follow up, please ask your Primary MD to get all Hospital records sent to his/her office.  If you experience worsening of your admission symptoms, develop shortness of breath, life threatening emergency, suicidal or homicidal thoughts you must seek medical attention immediately by calling 911 or calling your MD immediately  if symptoms less severe.  You must read complete instructions/literature along with all the possible adverse reactions/side effects for all the Medicines you take and that have been prescribed to you. Take any new Medicines after you have completely understood and accpet all the possible adverse reactions/side effects.   Do not drive or operate heavy machinery when taking Pain medications.   Do not take more than prescribed Pain, Sleep and Anxiety Medications  Special Instructions: If you have smoked or chewed Tobacco  in the last 2 yrs please stop smoking, stop any regular Alcohol  and or any Recreational drug use.  Wear Seat belts while driving.  Please note You were cared for by a hospitalist during your hospital stay. If you have any questions about your discharge medications or the care you received while you were in the hospital after you are discharged, you can call the unit and asked to speak with the hospitalist on call if the hospitalist that took care of you is not available. Once  you are discharged, your primary care physician will handle any further medical issues. Please note that NO REFILLS for any discharge medications will be authorized once you are discharged, as it is imperative that you return to your primary care physician (or establish a relationship with a primary care physician if you do not have one) for your aftercare needs so that they can reassess your need for medications and monitor your  lab values.  You can reach the hospitalist office at phone [phone] or fax [phone]   If you do not have a primary care physician, you can call [phone] for a physician referral.  Activity: As tolerated with Full fall precautions use walker/cane & assistance as needed  Diet: heart healthy  Disposition Home

## 2017-03-26 NOTE — Discharge Summary (Signed)
Physician Discharge Summary  Linda Lowe VEL:[id number] DOB: 12-Apr-1951 DOA: 03/22/2017  PCP: Renaye Rakers, MD  Admit date: 03/22/2017 Discharge date: 03/26/2017  Admitted From: home Disposition:  home  Recommendations for Outpatient Follow-up:  1. Follow up with PCP in 1-2 weeks 2. Continue Augmentin for 9 more days to complete a 14 day course  Home Health: none Equipment/Devices: none  Discharge Condition: stable CODE STATUS: Full code Diet recommendation: Heart healthy  HPI: Per Dr. Saul Fordyce is a 66 y.o. 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  The patient was in her usual state of health until about a week ago when she started to have nausea, malaise and diarrhea, up to 4 watery bowel movements per day.  The symptoms persisted all week, and actually progressed until today she was mostly unable to get out of bed and so she came to the emergency room this evening. There was associated confusion at times, headache, occasional cough, dizziness, generalized weakness. There was no fever, sputum production, dyspnea. There was no dysuria, hematuria, urinary frequency, urgency, or flank pain. There was no focal weakness, slurred speech.  Hospital Course: Discharge Diagnoses:  Principal Problem:   Hyponatremia Active Problems:   Essential hypertension   Chronic systolic heart failure (HCC)   Uncontrolled type 2 diabetes mellitus with hyperglycemia, with long-term current use of insulin (HCC)   SIRS (systemic inflammatory response syndrome) (HCC)   Diarrhea   Elevated troponin  Sepsis -due to E. coli bacteremia, likely urinary source, E. coli pansensitive, she clinically has defervesced, she is afebrile, will transition to p.o. Augmentin for 9 additional days to complete a total of 14 day course. Diarrhea- negative GI pathogen panel, this is improving Hyponatremia - resolved. Suspected to be  due to poor oral solute intake. Resolved with IV fluid rehydration and improved oral intake. Elevated troponin -Trend troponin, no chest pain reported. Echo and reported normal EF of 55-60% Chronic systolic and diastolic CHF -EF 25% previously. NICM.  Resume home medication Insulin-dependent diabetes -resume home medications Hypertension -resume home medications NSVT - x 1 (8 beat), resolved, echo as above.  Likely due to sepsis   Discharge Instructions   Allergies as of 03/26/2017   No Known Allergies     Medication List    TAKE these medications   amLODipine 5 MG tablet Commonly known as:  NORVASC TK 1 T PO QD   amoxicillin-clavulanate 875-125 MG tablet Commonly known as:  AUGMENTIN Take 1 tablet by mouth 2 (two) times daily.   atorvastatin 40 MG tablet Commonly known as:  LIPITOR TAKE 1 TABLET(40 MG) BY MOUTH DAILY   Canagliflozin-Metformin HCl [phone] MG Tabs Commonly known as:  INVOKAMET Take 1 tablet by mouth 2 (two) times daily.   carvedilol 25 MG tablet Commonly known as:  COREG Take 25 mg by mouth 2 (two) times daily.   EPIPEN 0.3 mg/0.3 mL Devi Generic drug:  EPINEPHrine Inject 0.3 mg into the muscle as needed (for allergic reaction). For allergic reactions   furosemide 20 MG tablet Commonly known as:  LASIX Take 20 mg by mouth 2 (two) times daily.   glucose blood test strip Commonly known as:  ONETOUCH VERIO Use to test blood sugar 3 times daily   Insulin Pen Needle 31G X 5 MM Misc Commonly known as:  B-D UF III MINI PEN NEEDLES USE 5 PEN NEEDLES PER DAY AS INSTRUCTED  B-D UF III MINI PEN NEEDLES 31G X 5 MM Misc Generic drug:  Insulin Pen Needle USE 5 NEEDLES EVERY DAY AS DIRECTED   irbesartan 300 MG tablet Commonly known as:  AVAPRO Take 300 mg by mouth daily.   naproxen 500 MG tablet Commonly known as:  NAPROSYN Take 500 mg by mouth 2 (two) times daily as needed for mild pain.   NOVOLOG FLEXPEN 100 UNIT/ML FlexPen Generic drug:   insulin aspart ADMINISTER 16 UNITS UNDER THE SKIN TWICE DAILY BEFORE LUNCH AND SUPPER   ONETOUCH DELICA LANCETS FINE Misc USE TO TEST BLOOD SUGAR 3 TIMES DAILY   oxybutynin 5 MG 24 hr tablet Commonly known as:  DITROPAN-XL Take 5 mg by mouth daily.   potassium chloride SA 20 MEQ tablet Commonly known as:  K-DUR,KLOR-CON TAKE 1 TABLET(20 MEQ) BY MOUTH DAILY   TOUJEO SOLOSTAR 300 UNIT/ML Sopn Generic drug:  Insulin Glargine INJECT 40 UNITS INTO THE SKIN DAILY What changed:  how much to take   VICTOZA 18 MG/3ML Sopn Generic drug:  liraglutide INJECT 1.8 MG UNDER THE SKIN DAILY      Follow-up Information    Renaye Rakers, MD. Schedule an appointment as soon as possible for a visit on 04/09/2017.   Specialty:  Family Medicine Why:  Appointment is on 04/09/17 at [facility] information: [address] [place] [address] [phone]          No Known Allergies  Consultations:  None   Procedures/Studies:  2D echo  Study Conclusions - Left ventricle: The cavity size was normal. Wall thickness was increased in a pattern of moderate LVH. There was moderate concentric hypertrophy. Systolic function was normal. The estimated ejection fraction was in the range of 55% to 60%. Doppler parameters are consistent with abnormal left ventricular relaxation (grade 1 diastolic dysfunction). - Left atrium: The atrium was mildly dilated.   Dg Abdomen Acute W/chest  Result Date: 03/22/2017 CLINICAL DATA:  Acute onset of generalized weakness. Decreased appetite, headache, dizziness, diaphoresis and nausea. Diarrhea. Initial encounter. EXAM: DG ABDOMEN ACUTE W/ 1V CHEST COMPARISON:  Chest radiograph and CT of the chest performed 09/16/2016 FINDINGS: The lungs are well-aerated. Mild left basilar atelectasis is noted. There is no evidence of pleural effusion or pneumothorax. The cardiomediastinal silhouette is borderline enlarged. A pacemaker/AICD is noted overlying the left chest wall,  with leads ending overlying the right atrium, right ventricle and coronary sinus. The visualized bowel gas pattern is unremarkable. Scattered stool and air are seen within the colon; there is no evidence of small bowel dilatation to suggest obstruction. No free intra-abdominal air is identified on the provided upright view. No acute osseous abnormalities are seen; the sacroiliac joints are unremarkable in appearance. IMPRESSION: 1. Unremarkable bowel gas pattern; no free intra-abdominal air seen. Small amount of stool noted in the colon. 2. Mild left basilar atelectasis noted.  Borderline cardiomegaly. Electronically Signed   By: Roanna Raider M.D.   On: 03/22/2017 01:42     Subjective: - no chest pain, shortness of breath, no abdominal pain, nausea or vomiting. Anxious to get home   Discharge Exam: Vitals:   03/26/17 0601 03/26/17 0610  BP: (!) 146/75 126/67  Pulse: 70 65  Resp: 16 18  Temp: 98.1 F (36.7 C) 97.7 F (36.5 C)   Vitals:   03/25/17 2212 03/26/17 0557 03/26/17 0601 03/26/17 0610  BP: (!) 163/78  (!) 146/75 126/67  Pulse: 68  70 65  Resp: 18  16 18  Temp: 98.7 F (37.1 C)  98.1 F (36.7 C) 97.7 F (36.5 C)  TempSrc:   Oral   SpO2: 99%  96% 99%  Weight:  74 kg (163 lb 3.2 oz)    Height:        General: Pt is alert, awake, not in acute distress Cardiovascular: RRR, S1/S2 +, no rubs, no gallops Respiratory: CTA bilaterally, no wheezing, no rhonchi Abdominal: Soft, NT, ND, bowel sounds + Extremities: no edema, no cyanosis    The results of significant diagnostics from this hospitalization (including imaging, microbiology, ancillary and laboratory) are listed below for reference.     Microbiology: Recent Results (from the past 240 hour(s))  Urine Culture     Status: Abnormal   Collection Time: 03/22/17  2:00 AM  Result Value Ref Range Status   Specimen Description URINE, RANDOM  Final   Special Requests NONE  Final   Culture >=100,000 COLONIES/mL ESCHERICHIA  COLI (A)  Final   Report Status 03/24/2017 FINAL  Final   Organism ID, Bacteria ESCHERICHIA COLI (A)  Final      Susceptibility   Escherichia coli - MIC*    AMPICILLIN <=2 SENSITIVE Sensitive     CEFAZOLIN <=4 SENSITIVE Sensitive     CEFTRIAXONE <=1 SENSITIVE Sensitive     CIPROFLOXACIN <=0.25 SENSITIVE Sensitive     GENTAMICIN <=1 SENSITIVE Sensitive     IMIPENEM <=0.25 SENSITIVE Sensitive     NITROFURANTOIN <=16 SENSITIVE Sensitive     TRIMETH/SULFA <=20 SENSITIVE Sensitive     AMPICILLIN/SULBACTAM <=2 SENSITIVE Sensitive     PIP/TAZO <=4 SENSITIVE Sensitive     Extended ESBL NEGATIVE Sensitive     * >=100,000 COLONIES/mL ESCHERICHIA COLI  Culture, blood (routine x 2)     Status: Abnormal   Collection Time: 03/22/17  5:29 AM  Result Value Ref Range Status   Specimen Description BLOOD RIGHT ANTECUBITAL  Final   Special Requests   Final    BOTTLES DRAWN AEROBIC AND ANAEROBIC Blood Culture adequate volume   Culture  Setup Time   Final    AEROBIC BOTTLE ONLY GRAM NEGATIVE RODS CRITICAL RESULT CALLED TO, READ BACK BY AND VERIFIED WITH: TO KCOOK(PHARMd) by [email or web address] 03/23/2017 at 2:47am    Culture ESCHERICHIA COLI (A)  Final   Report Status 03/25/2017 FINAL  Final   Organism ID, Bacteria ESCHERICHIA COLI  Final      Susceptibility   Escherichia coli - MIC*    AMPICILLIN <=2 SENSITIVE Sensitive     CEFAZOLIN <=4 SENSITIVE Sensitive     CEFEPIME <=1 SENSITIVE Sensitive     CEFTAZIDIME <=1 SENSITIVE Sensitive     CEFTRIAXONE <=1 SENSITIVE Sensitive     CIPROFLOXACIN <=0.25 SENSITIVE Sensitive     GENTAMICIN <=1 SENSITIVE Sensitive     IMIPENEM <=0.25 SENSITIVE Sensitive     TRIMETH/SULFA <=20 SENSITIVE Sensitive     AMPICILLIN/SULBACTAM <=2 SENSITIVE Sensitive     PIP/TAZO <=4 SENSITIVE Sensitive     Extended ESBL NEGATIVE Sensitive     * ESCHERICHIA COLI  Blood Culture ID Panel (Reflexed)     Status: Abnormal   Collection Time: 03/22/17  5:29 AM  Result Value Ref Range Status     Enterococcus species NOT DETECTED NOT DETECTED Final   Listeria monocytogenes NOT DETECTED NOT DETECTED Final   Staphylococcus species NOT DETECTED NOT DETECTED Final   Staphylococcus aureus NOT DETECTED NOT DETECTED Final   Streptococcus species NOT DETECTED NOT DETECTED Final   Streptococcus agalactiae NOT DETECTED NOT  DETECTED Final   Streptococcus pneumoniae NOT DETECTED NOT DETECTED Final   Streptococcus pyogenes NOT DETECTED NOT DETECTED Final   Acinetobacter baumannii NOT DETECTED NOT DETECTED Final   Enterobacteriaceae species DETECTED (A) NOT DETECTED Final    Comment: Enterobacteriaceae represent a large family of gram-negative bacteria, not a single organism. CRITICAL RESULT CALLED TO, READ BACK BY AND VERIFIED WITH: TO KCOOK(PHARMd) by [facility] 03/23/2017 AT 2:43AM    Enterobacter cloacae complex NOT DETECTED NOT DETECTED Final   Escherichia coli DETECTED (A) NOT DETECTED Final    Comment: CRITICAL RESULT CALLED TO, READ BACK BY AND VERIFIED WITH: TO KCOOK(PHARMd) by [facility] 03/23/2017 AT 2:43AM    Klebsiella oxytoca NOT DETECTED NOT DETECTED Final   Klebsiella pneumoniae NOT DETECTED NOT DETECTED Final   Proteus species NOT DETECTED NOT DETECTED Final   Serratia marcescens NOT DETECTED NOT DETECTED Final   Carbapenem resistance NOT DETECTED NOT DETECTED Final   Haemophilus influenzae NOT DETECTED NOT DETECTED Final   Neisseria meningitidis NOT DETECTED NOT DETECTED Final   Pseudomonas aeruginosa NOT DETECTED NOT DETECTED Final   Candida albicans NOT DETECTED NOT DETECTED Final   Candida glabrata NOT DETECTED NOT DETECTED Final   Candida krusei NOT DETECTED NOT DETECTED Final   Candida parapsilosis NOT DETECTED NOT DETECTED Final   Candida tropicalis NOT DETECTED NOT DETECTED Final  Culture, blood (routine x 2)     Status: None (Preliminary result)   Collection Time: 03/22/17  5:34 AM  Result Value Ref Range Status   Specimen Description BLOOD LEFT HAND  Final    Special Requests IN PEDIATRIC BOTTLE Blood Culture adequate volume  Final   Culture NO GROWTH 4 DAYS  Final   Report Status PENDING  Incomplete  Gastrointestinal Panel by PCR , Stool     Status: None   Collection Time: 03/22/17  5:47 PM  Result Value Ref Range Status   Campylobacter species NOT DETECTED NOT DETECTED Final   Plesimonas shigelloides NOT DETECTED NOT DETECTED Final   Salmonella species NOT DETECTED NOT DETECTED Final   Yersinia enterocolitica NOT DETECTED NOT DETECTED Final   Vibrio species NOT DETECTED NOT DETECTED Final   Vibrio cholerae NOT DETECTED NOT DETECTED Final   Enteroaggregative E coli (EAEC) NOT DETECTED NOT DETECTED Final   Enteropathogenic E coli (EPEC) NOT DETECTED NOT DETECTED Final   Enterotoxigenic E coli (ETEC) NOT DETECTED NOT DETECTED Final   Shiga like toxin producing E coli (STEC) NOT DETECTED NOT DETECTED Final   Shigella/Enteroinvasive E coli (EIEC) NOT DETECTED NOT DETECTED Final   Cryptosporidium NOT DETECTED NOT DETECTED Final   Cyclospora cayetanensis NOT DETECTED NOT DETECTED Final   Entamoeba histolytica NOT DETECTED NOT DETECTED Final   Giardia lamblia NOT DETECTED NOT DETECTED Final   Adenovirus F40/41 NOT DETECTED NOT DETECTED Final   Astrovirus NOT DETECTED NOT DETECTED Final   Norovirus GI/GII NOT DETECTED NOT DETECTED Final   Rotavirus A NOT DETECTED NOT DETECTED Final   Sapovirus (I, II, IV, and V) NOT DETECTED NOT DETECTED Final     Labs: BNP (last 3 results)  Recent Labs  03/22/17 0057  BNP 101.5*   Basic Metabolic Panel:  Recent Labs Lab 03/22/17 0057 03/23/17 0540 03/24/17 0456 03/24/17 1805 03/25/17 0444 03/26/17 0726  NA 127* 131* 132*  --  135 138  K 4.2 3.9 3.7  --  4.2 3.9  CL 97* 101 101  --  102 106  CO2 18* 21* 22  --  27 24  GLUCOSE  334* 185* 215*  --  219* 232*  BUN 16 14 10   --  8 7  CREATININE 0.88 0.63 0.59  --  0.60 0.50  CALCIUM 8.7* 8.6* 8.8*  --  8.8* 8.9  MG  --   --   --  1.9  --   --     PHOS  --   --   --  2.0*  --  3.1   Liver Function Tests:  Recent Labs Lab 03/22/17 0529  AST 34  ALT 27  ALKPHOS 84  BILITOT 1.5*  PROT 7.5  ALBUMIN 3.3*    Recent Labs Lab 03/22/17 0057  LIPASE 29   No results for input(s): AMMONIA in the last 168 hours. CBC:  Recent Labs Lab 03/22/17 0057 03/23/17 0540 03/26/17 0726  WBC 14.8* 9.2 5.8  HGB 12.9 12.2 12.3  HCT 38.4 36.4 37.9  MCV 86.1 85.4 86.3  PLT 168 160 254   Cardiac Enzymes:  Recent Labs Lab 03/22/17 0057 03/22/17 0529  TROPONINI 0.06* 0.07*   BNP: Invalid input(s): POCBNP CBG:  Recent Labs Lab 03/25/17 1222 03/25/17 1708 03/25/17 2206 03/26/17 0734 03/26/17 1229  GLUCAP 222* 180* 208* 226* 264*   D-Dimer No results for input(s): DDIMER in the last 72 hours. Hgb A1c No results for input(s): HGBA1C in the last 72 hours. Lipid Profile No results for input(s): CHOL, HDL, LDLCALC, TRIG, CHOLHDL, LDLDIRECT in the last 72 hours. Thyroid function studies No results for input(s): TSH, T4TOTAL, T3FREE, THYROIDAB in the last 72 hours.  Invalid input(s): FREET3 Anemia work up No results for input(s): VITAMINB12, FOLATE, FERRITIN, TIBC, IRON, RETICCTPCT in the last 72 hours. Urinalysis    Component Value Date/Time   COLORURINE YELLOW 03/22/2017 0200   APPEARANCEUR CLEAR 03/22/2017 0200   LABSPEC 1.027 03/22/2017 0200   PHURINE 6.0 03/22/2017 0200   GLUCOSEU >=500 (A) 03/22/2017 0200   GLUCOSEU >=1000 (A) 11/02/2014 1003   HGBUR SMALL (A) 03/22/2017 0200   BILIRUBINUR NEGATIVE 03/22/2017 0200   KETONESUR 20 (A) 03/22/2017 0200   PROTEINUR NEGATIVE 03/22/2017 0200   UROBILINOGEN 0.2 11/02/2014 1003   NITRITE POSITIVE (A) 03/22/2017 0200   LEUKOCYTESUR TRACE (A) 03/22/2017 0200   Sepsis Labs Invalid input(s): PROCALCITONIN,  WBC,  LACTICIDVEN Microbiology Recent Results (from the past 240 hour(s))  Urine Culture     Status: Abnormal   Collection Time: 03/22/17  2:00 AM  Result Value  Ref Range Status   Specimen Description URINE, RANDOM  Final   Special Requests NONE  Final   Culture >=100,000 COLONIES/mL ESCHERICHIA COLI (A)  Final   Report Status 03/24/2017 FINAL  Final   Organism ID, Bacteria ESCHERICHIA COLI (A)  Final      Susceptibility   Escherichia coli - MIC*    AMPICILLIN <=2 SENSITIVE Sensitive     CEFAZOLIN <=4 SENSITIVE Sensitive     CEFTRIAXONE <=1 SENSITIVE Sensitive     CIPROFLOXACIN <=0.25 SENSITIVE Sensitive     GENTAMICIN <=1 SENSITIVE Sensitive     IMIPENEM <=0.25 SENSITIVE Sensitive     NITROFURANTOIN <=16 SENSITIVE Sensitive     TRIMETH/SULFA <=20 SENSITIVE Sensitive     AMPICILLIN/SULBACTAM <=2 SENSITIVE Sensitive     PIP/TAZO <=4 SENSITIVE Sensitive     Extended ESBL NEGATIVE Sensitive     * >=100,000 COLONIES/mL ESCHERICHIA COLI  Culture, blood (routine x 2)     Status: Abnormal   Collection Time: 03/22/17  5:29 AM  Result Value Ref Range Status   Specimen  Description BLOOD RIGHT ANTECUBITAL  Final   Special Requests   Final    BOTTLES DRAWN AEROBIC AND ANAEROBIC Blood Culture adequate volume   Culture  Setup Time   Final    AEROBIC BOTTLE ONLY GRAM NEGATIVE RODS CRITICAL RESULT CALLED TO, READ BACK BY AND VERIFIED WITH: TO KCOOK(PHARMd) by [email or web address] 03/23/2017 at 2:47am    Culture ESCHERICHIA COLI (A)  Final   Report Status 03/25/2017 FINAL  Final   Organism ID, Bacteria ESCHERICHIA COLI  Final      Susceptibility   Escherichia coli - MIC*    AMPICILLIN <=2 SENSITIVE Sensitive     CEFAZOLIN <=4 SENSITIVE Sensitive     CEFEPIME <=1 SENSITIVE Sensitive     CEFTAZIDIME <=1 SENSITIVE Sensitive     CEFTRIAXONE <=1 SENSITIVE Sensitive     CIPROFLOXACIN <=0.25 SENSITIVE Sensitive     GENTAMICIN <=1 SENSITIVE Sensitive     IMIPENEM <=0.25 SENSITIVE Sensitive     TRIMETH/SULFA <=20 SENSITIVE Sensitive     AMPICILLIN/SULBACTAM <=2 SENSITIVE Sensitive     PIP/TAZO <=4 SENSITIVE Sensitive     Extended ESBL NEGATIVE Sensitive     *  ESCHERICHIA COLI  Blood Culture ID Panel (Reflexed)     Status: Abnormal   Collection Time: 03/22/17  5:29 AM  Result Value Ref Range Status   Enterococcus species NOT DETECTED NOT DETECTED Final   Listeria monocytogenes NOT DETECTED NOT DETECTED Final   Staphylococcus species NOT DETECTED NOT DETECTED Final   Staphylococcus aureus NOT DETECTED NOT DETECTED Final   Streptococcus species NOT DETECTED NOT DETECTED Final   Streptococcus agalactiae NOT DETECTED NOT DETECTED Final   Streptococcus pneumoniae NOT DETECTED NOT DETECTED Final   Streptococcus pyogenes NOT DETECTED NOT DETECTED Final   Acinetobacter baumannii NOT DETECTED NOT DETECTED Final   Enterobacteriaceae species DETECTED (A) NOT DETECTED Final    Comment: Enterobacteriaceae represent a large family of gram-negative bacteria, not a single organism. CRITICAL RESULT CALLED TO, READ BACK BY AND VERIFIED WITH: TO KCOOK(PHARMd) by [facility] 03/23/2017 AT 2:43AM    Enterobacter cloacae complex NOT DETECTED NOT DETECTED Final   Escherichia coli DETECTED (A) NOT DETECTED Final    Comment: CRITICAL RESULT CALLED TO, READ BACK BY AND VERIFIED WITH: TO KCOOK(PHARMd) by [facility] 03/23/2017 AT 2:43AM    Klebsiella oxytoca NOT DETECTED NOT DETECTED Final   Klebsiella pneumoniae NOT DETECTED NOT DETECTED Final   Proteus species NOT DETECTED NOT DETECTED Final   Serratia marcescens NOT DETECTED NOT DETECTED Final   Carbapenem resistance NOT DETECTED NOT DETECTED Final   Haemophilus influenzae NOT DETECTED NOT DETECTED Final   Neisseria meningitidis NOT DETECTED NOT DETECTED Final   Pseudomonas aeruginosa NOT DETECTED NOT DETECTED Final   Candida albicans NOT DETECTED NOT DETECTED Final   Candida glabrata NOT DETECTED NOT DETECTED Final   Candida krusei NOT DETECTED NOT DETECTED Final   Candida parapsilosis NOT DETECTED NOT DETECTED Final   Candida tropicalis NOT DETECTED NOT DETECTED Final  Culture, blood (routine x 2)     Status: None  (Preliminary result)   Collection Time: 03/22/17  5:34 AM  Result Value Ref Range Status   Specimen Description BLOOD LEFT HAND  Final   Special Requests IN PEDIATRIC BOTTLE Blood Culture adequate volume  Final   Culture NO GROWTH 4 DAYS  Final   Report Status PENDING  Incomplete  Gastrointestinal Panel by PCR , Stool     Status: None   Collection Time: 03/22/17  5:47 PM  Result Value  Ref Range Status   Campylobacter species NOT DETECTED NOT DETECTED Final   Plesimonas shigelloides NOT DETECTED NOT DETECTED Final   Salmonella species NOT DETECTED NOT DETECTED Final   Yersinia enterocolitica NOT DETECTED NOT DETECTED Final   Vibrio species NOT DETECTED NOT DETECTED Final   Vibrio cholerae NOT DETECTED NOT DETECTED Final   Enteroaggregative E coli (EAEC) NOT DETECTED NOT DETECTED Final   Enteropathogenic E coli (EPEC) NOT DETECTED NOT DETECTED Final   Enterotoxigenic E coli (ETEC) NOT DETECTED NOT DETECTED Final   Shiga like toxin producing E coli (STEC) NOT DETECTED NOT DETECTED Final   Shigella/Enteroinvasive E coli (EIEC) NOT DETECTED NOT DETECTED Final   Cryptosporidium NOT DETECTED NOT DETECTED Final   Cyclospora cayetanensis NOT DETECTED NOT DETECTED Final   Entamoeba histolytica NOT DETECTED NOT DETECTED Final   Giardia lamblia NOT DETECTED NOT DETECTED Final   Adenovirus F40/41 NOT DETECTED NOT DETECTED Final   Astrovirus NOT DETECTED NOT DETECTED Final   Norovirus GI/GII NOT DETECTED NOT DETECTED Final   Rotavirus A NOT DETECTED NOT DETECTED Final   Sapovirus (I, II, IV, and V) NOT DETECTED NOT DETECTED Final     Time coordinating discharge: 35 minutes  SIGNED:  Pamella Pert, MD  [facility] 03/26/2017, 2:48 PM Pager [phone]  If 7PM-7AM, please contact night-coverage [email or web address] Password [email or web address]

## 2017-03-27 LAB — CULTURE, BLOOD (ROUTINE X 2)
Culture: NO GROWTH
Special Requests: ADEQUATE

## 2017-03-28 ENCOUNTER — Ambulatory Visit: Payer: Federal, State, Local not specified - PPO | Admitting: Endocrinology

## 2017-04-08 ENCOUNTER — Other Ambulatory Visit: Payer: Self-pay | Admitting: Family Medicine

## 2017-04-08 DIAGNOSIS — Z1231 Encounter for screening mammogram for malignant neoplasm of breast: Secondary | ICD-10-CM

## 2017-04-10 ENCOUNTER — Other Ambulatory Visit (INDEPENDENT_AMBULATORY_CARE_PROVIDER_SITE_OTHER): Payer: Federal, State, Local not specified - PPO

## 2017-04-10 ENCOUNTER — Other Ambulatory Visit: Payer: Self-pay

## 2017-04-10 DIAGNOSIS — E1165 Type 2 diabetes mellitus with hyperglycemia: Secondary | ICD-10-CM

## 2017-04-10 DIAGNOSIS — Z794 Long term (current) use of insulin: Secondary | ICD-10-CM | POA: Diagnosis not present

## 2017-04-10 LAB — LIPID PANEL
Cholesterol: 166 mg/dL (ref 0–200)
HDL: 53.6 mg/dL (ref >39.00)
LDL Cholesterol: 93 mg/dL (ref 0–99)
NonHDL: 112.53
Total CHOL/HDL Ratio: 3
Triglycerides: 99 mg/dL (ref 0.0–149.0)
VLDL: 19.8 mg/dL (ref 0.0–40.0)

## 2017-04-10 LAB — COMPREHENSIVE METABOLIC PANEL
ALT: 25 U/L (ref 0–35)
AST: 21 U/L (ref 0–37)
Albumin: 4 g/dL (ref 3.5–5.2)
Alkaline Phosphatase: 77 U/L (ref 39–117)
BUN: 10 mg/dL (ref 6–23)
CO2: 31 mEq/L (ref 19–32)
Calcium: 9.8 mg/dL (ref 8.4–10.5)
Chloride: 101 mEq/L (ref 96–112)
Creatinine, Ser: 0.64 mg/dL (ref 0.40–1.20)
GFR: 99.04 mL/min (ref >60.00)
Glucose, Bld: 141 mg/dL — ABNORMAL HIGH (ref 70–99)
Potassium: 4.6 mEq/L (ref 3.5–5.1)
Sodium: 138 mEq/L (ref 135–145)
Total Bilirubin: 0.3 mg/dL (ref 0.2–1.2)
Total Protein: 7.8 g/dL (ref 6.0–8.3)

## 2017-04-10 LAB — HEMOGLOBIN A1C: Hgb A1c MFr Bld: 8.3 % — ABNORMAL HIGH (ref 4.6–6.5)

## 2017-04-10 MED ORDER — CANAGLIFLOZIN-METFORMIN HCL 150-1000 MG PO TABS: 1.0000 | ORAL_TABLET | Freq: Two times a day (BID) | ORAL | 3 refills | Status: DC

## 2017-04-12 ENCOUNTER — Other Ambulatory Visit: Payer: Self-pay | Admitting: Endocrinology

## 2017-04-17 ENCOUNTER — Other Ambulatory Visit: Payer: Self-pay

## 2017-04-17 ENCOUNTER — Ambulatory Visit (INDEPENDENT_AMBULATORY_CARE_PROVIDER_SITE_OTHER): Payer: Federal, State, Local not specified - PPO | Admitting: Endocrinology

## 2017-04-17 VITALS — BP 130/68 | HR 63 | Ht 64.0 in | Wt 165.0 lb

## 2017-04-17 DIAGNOSIS — Z794 Long term (current) use of insulin: Secondary | ICD-10-CM | POA: Diagnosis not present

## 2017-04-17 DIAGNOSIS — E1165 Type 2 diabetes mellitus with hyperglycemia: Secondary | ICD-10-CM

## 2017-04-17 MED ORDER — INSULIN ASPART 100 UNIT/ML FLEXPEN: PEN_INJECTOR | SUBCUTANEOUS | 3 refills | Status: DC

## 2017-04-17 NOTE — Patient Instructions (Signed)
Protein at Bfst daily  Dinner take 10-12 units if eating small meal or less starchy foods  Check blood sugars on waking up  3-4/7  Also check blood sugars about 2 hours after a meal and do this after different meals by rotation  Recommended blood sugar levels on waking up is 90-130 and about 2 hours after meal is 130-160  Please bring your blood sugar monitor to each visit, thank you

## 2017-04-17 NOTE — Progress Notes (Signed)
Patient ID: Linda Lowe, female   DOB: 10-07-1950, 66 y.o.   MRN: [id number]   Reason for Appointment: Diabetes follow-up   History of Present Illness   Diagnosis: date of diagnosis: ? 2009   Current insulin regimen:       TOUJEO 42 UNITS IN AM ONCE DAILY and Novolog 16 at lunch and 16 at dinner  Non-insulin hypoglycemic drugs: Victoza 1.8 mg daily at lunch.  Invokamet 150/1000 twice a day  A1c has been over 8% usually and is now 8.3 although in April was down to 7.5  Current blood sugar patterns and problems identified:  She is trying to mark her readings before and after meals but not clear if she is doing this accurately  Although she was hospitalized about a month ago her insulin requirement in dose has not changed  She has 3 episodes of hypoglycemia in the evenings between about 8-9 PM and not clear if these are before or after eating  She appears to have extreme variability in her diet especially in the morning when she can sometimes just eat crackers and other times will eat noodles.  She does not adjust her NovoLog based on what she is eating both morning and evening  Also sometimes she may have 3 meals a day instead of 2 and does have some readings over 200 in the early afternoon  FASTING readings are fairly good with Toujeo once a day  Unclear why her A1c as high since her blood sugars averaging under 130 at home  Has hypoglycemia as below  She has lost weight but not clear if this was related to her hospitalization  She is trying to exercise regularly however  Hypoglycemia: Has 2 low readings yesterday and once last month fasting   Glucometer: Verio.  Blood Glucose readings are being checked mostly before breakfast and supper now  Readings by download:  Mean values apply above for all meters except median for One Touch  PRE-MEAL Fasting Lunch Dinner Bedtime Overall  Glucose range: 100-1 69    30-1 59     Mean/median: 130    127+/-43     POST-MEAL PC Breakfast PC Lunch PC Dinner  Glucose range:  1 14-265    80-1 92   Mean/median:       DIET: Usually small portions, at times has more carbohydrate   Meals: 2-3 meals per day usually at 7 a.m. and 7 p.m.   Physical activity: exercise: walking/bike, 20 min, 7 days a week;    Certified Diabetes Educator visit: Most recent:, 2015.  Dietician visit: Most recent: 06/2016     Wt Readings from Last 3 Encounters:  04/17/17 165 lb (74.8 kg)  03/26/17 163 lb 3.2 oz (74 kg)  12/26/16 170 lb (77.1 kg)   LABS:  Lab Results  Component Value Date   HGBA1C 8.3 (H) 04/10/2017   HGBA1C 7.5 (H) 12/20/2016   HGBA1C 8.4 (H) 07/18/2016   Lab Results  Component Value Date   MICROALBUR 1.3 09/24/2016   LDLCALC 93 04/10/2017   CREATININE 0.64 04/10/2017     PAST history: Her diabetes has been usually poorly controlled. She has been on multiple medications including Victoza and basal bolus regimen also. However she  had difficulty with consistent compliance with insulin, diet and exercise regimen. With starting Victoza her blood sugars appeared to be improved. After repeated sessions with educators she was able to improve her compliance level and blood sugars had been improving since about  10/13. Insulin doses have been increased overall  A1c previously had been usually over 8% and below this for the first time in 2/14. She had been switched to a combination of Invokana and metformin instead of Janumet in late 2014 In 1/15 her blood sugars are excellent with A1c 7%      No visits with results within 1 Week(s) from this visit.  Latest known visit with results is:  Lab on 04/10/2017  Component Date Value Ref Range Status  . Hgb A1c MFr Bld 04/10/2017 8.3* 4.6 - 6.5 % Final   Glycemic Control Guidelines for People with Diabetes:Non Diabetic:  <6%Goal of Therapy: <7%Additional Action Suggested:  >8%   . Sodium 04/10/2017 138  135 - 145 mEq/L Final  . Potassium 04/10/2017 4.6   3.5 - 5.1 mEq/L Final  . Chloride 04/10/2017 101  96 - 112 mEq/L Final  . CO2 04/10/2017 31  19 - 32 mEq/L Final  . Glucose, Bld 04/10/2017 141* 70 - 99 mg/dL Final  . BUN 16/06/9603 10  6 - 23 mg/dL Final  . Creatinine, Ser 04/10/2017 0.64  0.40 - 1.20 mg/dL Final  . Total Bilirubin 04/10/2017 0.3  0.2 - 1.2 mg/dL Final  . Alkaline Phosphatase 04/10/2017 77  39 - 117 U/L Final  . AST 04/10/2017 21  0 - 37 U/L Final  . ALT 04/10/2017 25  0 - 35 U/L Final  . Total Protein 04/10/2017 7.8  6.0 - 8.3 g/dL Final  . Albumin 54/05/8118 4.0  3.5 - 5.2 g/dL Final  . Calcium 14/78/2956 9.8  8.4 - 10.5 mg/dL Final  . GFR 21/30/8657 99.04  >60.00 mL/min Final  . Cholesterol 04/10/2017 166  0 - 200 mg/dL Final   ATP III Classification       Desirable:  < 200 mg/dL               Borderline High:  200 - 239 mg/dL          High:  > = 846 mg/dL  . Triglycerides 04/10/2017 99.0  0.0 - 149.0 mg/dL Final   Normal:  <962 mg/dLBorderline High:  150 - 199 mg/dL  . HDL 04/10/2017 53.60  >39.00 mg/dL Final  . VLDL 95/28/4132 19.8  0.0 - 40.0 mg/dL Final  . LDL Cholesterol 04/10/2017 93  0 - 99 mg/dL Final  . Total CHOL/HDL Ratio 04/10/2017 3   Final                  Men          Women1/2 Average Risk     3.4          3.3Average Risk          5.0          4.42X Average Risk          9.6          7.13X Average Risk          15.0          11.0                      . NonHDL 04/10/2017 112.53   Final   NOTE:  Non-HDL goal should be 30 mg/dL higher than patient's LDL goal (i.e. LDL goal of < 70 mg/dL, would have non-HDL goal of < 100 mg/dL)    Allergies as of 12/18/100   No Known Allergies     Medication List  Accurate as of 04/17/17  1:06 PM. Always use your most recent med list.          amLODipine 5 MG tablet Commonly known as:  NORVASC TK 1 T PO QD   amoxicillin-clavulanate 875-125 MG tablet Commonly known as:  AUGMENTIN Take 1 tablet by mouth 2 (two) times daily.   atorvastatin 40 MG  tablet Commonly known as:  LIPITOR TAKE 1 TABLET(40 MG) BY MOUTH DAILY   Canagliflozin-Metformin HCl 6051521839 MG Tabs Commonly known as:  INVOKAMET Take 1 tablet by mouth 2 (two) times daily.   carvedilol 25 MG tablet Commonly known as:  COREG Take 25 mg by mouth 2 (two) times daily.   EPIPEN 0.3 mg/0.3 mL Devi Generic drug:  EPINEPHrine Inject 0.3 mg into the muscle as needed (for allergic reaction). For allergic reactions   furosemide 20 MG tablet Commonly known as:  LASIX Take 20 mg by mouth 2 (two) times daily.   glucose blood test strip Commonly known as:  ONETOUCH VERIO Use to test blood sugar 3 times daily   insulin aspart 100 UNIT/ML FlexPen Commonly known as:  NOVOLOG FLEXPEN ADMINISTER 16 UNITS UNDER THE SKIN TWICE DAILY BEFORE LUNCH AND SUPPER   Insulin Pen Needle 31G X 5 MM Misc Commonly known as:  B-D UF III MINI PEN NEEDLES USE 5 PEN NEEDLES PER DAY AS INSTRUCTED   B-D UF III MINI PEN NEEDLES 31G X 5 MM Misc Generic drug:  Insulin Pen Needle USE 5 NEEDLES EVERY DAY AS DIRECTED   irbesartan 300 MG tablet Commonly known as:  AVAPRO Take 300 mg by mouth daily.   naproxen 500 MG tablet Commonly known as:  NAPROSYN Take 500 mg by mouth 2 (two) times daily as needed for mild pain.   ONETOUCH DELICA LANCETS FINE Misc USE TO TEST BLOOD SUGAR 3 TIMES DAILY   oxybutynin 5 MG 24 hr tablet Commonly known as:  DITROPAN-XL Take 5 mg by mouth daily.   potassium chloride SA 20 MEQ tablet Commonly known as:  K-DUR,KLOR-CON TAKE 1 TABLET(20 MEQ) BY MOUTH DAILY   TOUJEO SOLOSTAR 300 UNIT/ML Sopn Generic drug:  Insulin Glargine INJECT 40 UNITS INTO THE SKIN DAILY   VICTOZA 18 MG/3ML Sopn Generic drug:  liraglutide INJECT 1.8 MG UNDER THE SKIN DAILY       Allergies: No Known Allergies  Past Medical History:  Diagnosis Date  . Arthritis   . Cardiomyopathy (HCC)    a. 05/2012 Echo: EF 30-35%, Gr 1 DD, mild LVH, mild MR, pericardial effusion  . Diabetes  mellitus    a. Dx > 5 y ago  . Hypercholesteremia   . Hypertension   . Pericardial effusion    a. 05/2012 Echo: circumferential pericardial effusion, mostly 60mm, largest pocket posterior - 38mm, mild RA indentation, no RV collapse, no tamponade.    Past Surgical History:  Procedure Laterality Date  . ABDOMINAL HYSTERECTOMY    . BI-VENTRICULAR IMPLANTABLE CARDIOVERTER DEFIBRILLATOR N/A 12/01/2013   Procedure: BI-VENTRICULAR IMPLANTABLE CARDIOVERTER DEFIBRILLATOR  (CRT-D);  Surgeon: Duke Salvia, MD;  Location: [facility] CATH LAB;  Service: Cardiovascular;  Laterality: N/A;  . FOOT SURGERY    . LEFT AND RIGHT HEART CATHETERIZATION WITH CORONARY ANGIOGRAM  05/26/2012   Procedure: LEFT AND RIGHT HEART CATHETERIZATION WITH CORONARY ANGIOGRAM;  Surgeon: Tonny Bollman, MD;  Location: [facility] CATH LAB;  Service: Cardiovascular;;    Family History  Problem Relation Age of Onset  . Other Unknown        No  history of CAD  . Tuberculosis Father        died when pt was young.    Social History:  reports that she has quit smoking. She has never used smokeless tobacco. She reports that she does not drink alcohol or use drugs.  Review of Systems   Lipids: LDL usually controlled with 40 mg Lipitor   Lab Results  Component Value Date   CHOL 166 04/10/2017   HDL 53.60 04/10/2017   LDLCALC 93 04/10/2017   LDLDIRECT 141.0 06/09/2013   TRIG 99.0 04/10/2017   CHOLHDL 3 04/10/2017    Hypertension: Has been on multiple medications, she follows with PCP and cardiologist, Blood pressure Improved recently with weight loss  Taking 20mg  Lasix because of history of LV dysfunction, followed by cardiologist   Diabetic foot exam was normal in 04/2017     Date of last eye exam 8/17, does not know the name of the ophthalmologist   Examination:   BP 130/68   Pulse 63   Ht 5\' 4"  (1.626 m)   Wt 165 lb (74.8 kg)   SpO2 96%   BMI 28.32 kg/m   Body mass index is 28.32 kg/m.   Diabetic Foot Exam - Simple    Simple Foot Form Diabetic Foot exam was performed with the following findings:  Yes   Visual Inspection No deformities, no ulcerations, no other skin breakdown bilaterally:  Yes Sensation Testing Intact to touch and monofilament testing bilaterally:  Yes Pulse Check Posterior Tibialis and Dorsalis pulse intact bilaterally:  Yes Comments     Assesment:   Diabetes type 2, uncontrolled:   See history of present illness for detailed discussion of his current management, blood sugar patterns and problems identified  She continues to be on  a regimen of basal bolus insulin, Victoza and Invokamet  Her A1c has been over 8% consistently despite blood sugars not being documented as high at home  She has had hypoglycemia and evenings 3 times possibly after her evening meal when she did not eat much carbohydrate Discussed need to adjust her mealtime doses based on what she is eating especially in the evening and to cut back to 10-12 units if eating smaller meals or less carbohydrate check relatively lower sugars at times before breakfast and supper but need to know more about her sugars after meals to adjust her mealtime doses  Otherwise will continue her regimen unchanged  LIPIDS: Controlled, may have not been taking Lipitor regularly before when LDL was high   Patient Instructions  Protein at Bfst daily  Dinner take 10-12 units if eating small meal or less starchy foods  Check blood sugars on waking up  3-4/7  Also check blood sugars about 2 hours after a meal and do this after different meals by rotation  Recommended blood sugar levels on waking up is 90-130 and about 2 hours after meal is 130-160  Please bring your blood sugar monitor to each visit, thank you     [facility] 04/17/2017, 1:06 PM     Note: This office note was prepared with Dragon voice recognition system technology. Any transcriptional errors that result from this process are unintentional.

## 2017-04-23 ENCOUNTER — Ambulatory Visit: Payer: Federal, State, Local not specified - PPO

## 2017-04-23 ENCOUNTER — Ambulatory Visit
Admission: RE | Admit: 2017-04-23 | Discharge: 2017-04-23 | Disposition: A | Discharge status: Home / Self Care | Payer: Federal, State, Local not specified - PPO | Source: Ambulatory Visit | Attending: Family Medicine | Admitting: Family Medicine

## 2017-04-23 DIAGNOSIS — Z1231 Encounter for screening mammogram for malignant neoplasm of breast: Secondary | ICD-10-CM

## 2017-04-25 ENCOUNTER — Other Ambulatory Visit: Payer: Self-pay | Admitting: Endocrinology

## 2017-04-28 ENCOUNTER — Other Ambulatory Visit: Payer: Self-pay | Admitting: Endocrinology

## 2017-05-04 ENCOUNTER — Other Ambulatory Visit: Payer: Self-pay | Admitting: Endocrinology

## 2017-05-06 ENCOUNTER — Other Ambulatory Visit: Payer: Self-pay | Admitting: Endocrinology

## 2017-05-11 ENCOUNTER — Other Ambulatory Visit: Payer: Self-pay | Admitting: Endocrinology

## 2017-06-04 ENCOUNTER — Other Ambulatory Visit: Payer: Self-pay | Admitting: Endocrinology

## 2017-06-18 DIAGNOSIS — I10 Essential (primary) hypertension: Secondary | ICD-10-CM | POA: Diagnosis not present

## 2017-06-18 DIAGNOSIS — Z95810 Presence of automatic (implantable) cardiac defibrillator: Secondary | ICD-10-CM | POA: Diagnosis not present

## 2017-06-18 DIAGNOSIS — I428 Other cardiomyopathies: Secondary | ICD-10-CM | POA: Diagnosis not present

## 2017-06-28 ENCOUNTER — Other Ambulatory Visit: Payer: Self-pay | Admitting: Endocrinology

## 2017-07-01 ENCOUNTER — Encounter (HOSPITAL_COMMUNITY): Payer: Self-pay | Admitting: Emergency Medicine

## 2017-07-01 ENCOUNTER — Emergency Department (HOSPITAL_COMMUNITY): Payer: Federal, State, Local not specified - PPO

## 2017-07-01 ENCOUNTER — Emergency Department (HOSPITAL_COMMUNITY)
Admission: EM | Admit: 2017-07-01 | Discharge: 2017-07-01 | Disposition: A | Discharge status: Home / Self Care | Payer: Federal, State, Local not specified - PPO | Attending: Emergency Medicine | Admitting: Emergency Medicine

## 2017-07-01 DIAGNOSIS — Y929 Unspecified place or not applicable: Secondary | ICD-10-CM | POA: Insufficient documentation

## 2017-07-01 DIAGNOSIS — M25561 Pain in right knee: Secondary | ICD-10-CM | POA: Diagnosis not present

## 2017-07-01 DIAGNOSIS — Y9389 Activity, other specified: Secondary | ICD-10-CM | POA: Insufficient documentation

## 2017-07-01 DIAGNOSIS — Z794 Long term (current) use of insulin: Secondary | ICD-10-CM | POA: Insufficient documentation

## 2017-07-01 DIAGNOSIS — E1165 Type 2 diabetes mellitus with hyperglycemia: Secondary | ICD-10-CM | POA: Insufficient documentation

## 2017-07-01 DIAGNOSIS — I5022 Chronic systolic (congestive) heart failure: Secondary | ICD-10-CM | POA: Diagnosis not present

## 2017-07-01 DIAGNOSIS — Z79899 Other long term (current) drug therapy: Secondary | ICD-10-CM | POA: Diagnosis not present

## 2017-07-01 DIAGNOSIS — Y999 Unspecified external cause status: Secondary | ICD-10-CM | POA: Diagnosis not present

## 2017-07-01 DIAGNOSIS — I110 Hypertensive heart disease with heart failure: Secondary | ICD-10-CM | POA: Insufficient documentation

## 2017-07-01 DIAGNOSIS — W010XXA Fall on same level from slipping, tripping and stumbling without subsequent striking against object, initial encounter: Secondary | ICD-10-CM | POA: Diagnosis not present

## 2017-07-01 DIAGNOSIS — S8991XA Unspecified injury of right lower leg, initial encounter: Secondary | ICD-10-CM | POA: Diagnosis present

## 2017-07-01 DIAGNOSIS — T148XXA Other injury of unspecified body region, initial encounter: Secondary | ICD-10-CM | POA: Diagnosis not present

## 2017-07-01 DIAGNOSIS — S82041A Displaced comminuted fracture of right patella, initial encounter for closed fracture: Secondary | ICD-10-CM | POA: Diagnosis not present

## 2017-07-01 DIAGNOSIS — Z87891 Personal history of nicotine dependence: Secondary | ICD-10-CM | POA: Insufficient documentation

## 2017-07-01 MED ORDER — IBUPROFEN 400 MG PO TABS
ORAL_TABLET | ORAL | Status: AC
  Filled 2017-07-01: qty 1

## 2017-07-01 MED ORDER — HYDROMORPHONE HCL 1 MG/ML IJ SOLN
1.0000 mg | Freq: Once | INTRAMUSCULAR | Status: AC
  Administered 2017-07-01: 1 mg via INTRAMUSCULAR
  Filled 2017-07-01: qty 1

## 2017-07-01 MED ORDER — ONDANSETRON 4 MG PO TBDP
8.0000 mg | ORAL_TABLET | Freq: Once | ORAL | Status: AC
  Administered 2017-07-01: 8 mg via ORAL
  Filled 2017-07-01: qty 2

## 2017-07-01 MED ORDER — HYDROCODONE-ACETAMINOPHEN 5-325 MG PO TABS: 1.0000 | ORAL_TABLET | ORAL | 0 refills | Status: DC | PRN

## 2017-07-01 MED ORDER — IBUPROFEN 400 MG PO TABS
400.0000 mg | ORAL_TABLET | Freq: Once | ORAL | Status: AC
  Administered 2017-07-01: 400 mg via ORAL

## 2017-07-01 MED ORDER — HYDROCODONE-ACETAMINOPHEN 5-325 MG PO TABS
1.0000 | ORAL_TABLET | Freq: Once | ORAL | Status: AC
  Administered 2017-07-01: 1 via ORAL
  Filled 2017-07-01: qty 1

## 2017-07-01 NOTE — ED Provider Notes (Signed)
[facility] EMERGENCY DEPARTMENT Provider Note   CSN: [id number] Arrival date & time: 07/01/17  0018     History   Chief Complaint Chief Complaint  Patient presents with  . Knee Pain    HPI Linda Lowe is a 66 y.o. female.  Patient presents to the ER for evaluation of right knee pain after a fall. Patient reports that her husband sprayed bug spray and it was still on the floor when she was walking, slipped on the wet floor. She landed directly on her right knee. She has been experiencing progressively increasing pain and swelling since the fall which occurred earlier tonight. Pain worsens with any movement of the knee. She did not hit her head or lose consciousness. No neck or back pain, no hip or ankle pain.      Past Medical History:  Diagnosis Date  . Arthritis   . Cardiomyopathy (HCC)    a. 05/2012 Echo: EF 30-35%, Gr 1 DD, mild LVH, mild MR, pericardial effusion  . Diabetes mellitus    a. Dx > 5 y ago  . Hypercholesteremia   . Hypertension   . Pericardial effusion    a. 05/2012 Echo: circumferential pericardial effusion, mostly 12mm, largest pocket posterior - 46mm, mild RA indentation, no RV collapse, no tamponade.    Patient Active Problem List   Diagnosis Date Noted  . Hyponatremia 03/22/2017  . SIRS (systemic inflammatory response syndrome) (HCC) 03/22/2017  . Diarrhea 03/22/2017  . Elevated troponin 03/22/2017  . Uncontrolled type 2 diabetes mellitus with hyperglycemia, with long-term current use of insulin (HCC) 09/28/2016  . Nonischemic cardiomyopathy (HCC) 08/26/2013  . Chronic systolic heart failure (HCC) 08/26/2013  . LBBB (left bundle branch block) 08/26/2013  . Hyperlipidemia 06/23/2013  . Essential hypertension 05/23/2012    Past Surgical History:  Procedure Laterality Date  . ABDOMINAL HYSTERECTOMY    . BI-VENTRICULAR IMPLANTABLE CARDIOVERTER DEFIBRILLATOR N/A 12/01/2013   Procedure: BI-VENTRICULAR IMPLANTABLE CARDIOVERTER  DEFIBRILLATOR  (CRT-D);  Surgeon: Duke Salvia, MD;  Location: [facility] CATH LAB;  Service: Cardiovascular;  Laterality: N/A;  . FOOT SURGERY    . LEFT AND RIGHT HEART CATHETERIZATION WITH CORONARY ANGIOGRAM  05/26/2012   Procedure: LEFT AND RIGHT HEART CATHETERIZATION WITH CORONARY ANGIOGRAM;  Surgeon: Tonny Bollman, MD;  Location: [facility] CATH LAB;  Service: Cardiovascular;;    OB History    No data available       Home Medications    Prior to Admission medications   Medication Sig Start Date End Date Taking? Authorizing Provider  amLODipine (NORVASC) 5 MG tablet TK 1 T PO QD 09/16/16   [provider]  amoxicillin-clavulanate (AUGMENTIN) 875-125 MG tablet Take 1 tablet by mouth 2 (two) times daily. Patient not taking: Reported on 04/17/2017 03/26/17   Leatha Gilding, MD  atorvastatin (LIPITOR) 40 MG tablet TAKE 1 TABLET(40 MG) BY MOUTH DAILY 06/04/17   Reather Littler, MD  B-D UF III MINI PEN NEEDLES 31G X 5 MM MISC USE 5 NEEDLES EVERY DAY AS DIRECTED 06/29/17   Reather Littler, MD  Canagliflozin-Metformin HCl (INVOKAMET) 365 423 3093 MG TABS Take 1 tablet by mouth 2 (two) times daily. 04/10/17   Reather Littler, MD  carvedilol (COREG) 25 MG tablet Take 25 mg by mouth 2 (two) times daily. 07/15/15   [provider]  EPINEPHrine (EPIPEN) 0.3 mg/0.3 mL DEVI Inject 0.3 mg into the muscle as needed (for allergic reaction). For allergic reactions     [provider]  furosemide (LASIX) 20  MG tablet Take 20 mg by mouth 2 (two) times daily.     [provider]  insulin aspart (NOVOLOG FLEXPEN) 100 UNIT/ML FlexPen ADMINISTER 16 UNITS UNDER THE SKIN TWICE DAILY BEFORE LUNCH AND SUPPER 04/17/17   Reather Littler, MD  Insulin Pen Needle (B-D UF III MINI PEN NEEDLES) 31G X 5 MM MISC USE 5 PEN NEEDLES PER DAY AS INSTRUCTED 10/14/16   Reather Littler, MD  INVOKAMET [id number] MG TABS TAKE 1 TABLET BY MOUTH TWICE DAILY 05/06/17   Reather Littler, MD  irbesartan (AVAPRO) 300 MG tablet Take 300 mg by mouth daily.     [provider]  naproxen (NAPROSYN) 500 MG tablet Take 500 mg by mouth 2 (two) times daily as needed for mild pain.     [provider]  [facility] DELICA LANCETS FINE MISC USE TO TEST BLOOD SUGAR 3 TIMES DAILY 10/28/16   Reather Littler, MD  [facility] VERIO test strip USE TO TEST BLOOD SUGAR 3 TIMES DAILY 04/28/17   Reather Littler, MD  oxybutynin (DITROPAN-XL) 5 MG 24 hr tablet Take 5 mg by mouth daily.    [provider]  potassium chloride SA (K-DUR,KLOR-CON) 20 MEQ tablet TAKE 1 TABLET(20 MEQ) BY MOUTH DAILY 12/01/16   Reather Littler, MD  potassium chloride SA (K-DUR,KLOR-CON) 20 MEQ tablet TAKE 1 TABLET(20 MEQ) BY MOUTH DAILY 04/25/17   Reather Littler, MD  TOUJEO SOLOSTAR 300 UNIT/ML SOPN INJECT 40 UNITS INTO THE SKIN DAILY Patient taking differently: Inject 42 Units into the skin daily.  12/30/16   Reather Littler, MD  VICTOZA 18 MG/3ML SOPN INJECT 1.8 MG UNDER THE SKIN DAILY 05/04/17   Reather Littler, MD    Family History Family History  Problem Relation Age of Onset  . Other Unknown        No history of CAD  . Tuberculosis Father        died when pt was young.    Social History Social History  Substance Use Topics  . Smoking status: Former Games developer  . Smokeless tobacco: Never Used     Comment: Quit "long time ago."  . Alcohol use No     Allergies   Patient has no known allergies.   Review of Systems Review of Systems  Musculoskeletal: Positive for arthralgias.  All other systems reviewed and are negative.    Physical Exam Updated Vital Signs BP (!) 175/74 (BP Location: Left Arm)   Pulse 66   Temp 98.5 F (36.9 C) (Oral)   Resp 18   Ht 5\' 3"  (1.6 m)   Wt 74.8 kg (165 lb)   SpO2 100%   BMI 29.23 kg/m   Physical Exam  Constitutional: She is oriented to person, place, and time. She appears well-developed and well-nourished. No distress.  HENT:  Head: Normocephalic and atraumatic.  Right Ear: Hearing normal.  Left Ear: Hearing normal.  Nose: Nose  normal.  Mouth/Throat: Oropharynx is clear and moist and mucous membranes are normal.  Eyes: Pupils are equal, round, and reactive to light. Conjunctivae and EOM are normal.  Neck: Normal range of motion. Neck supple.  Cardiovascular: Regular rhythm, S1 normal and S2 normal.  Exam reveals no gallop and no friction rub.   No murmur heard. Pulmonary/Chest: Effort normal and breath sounds normal. No respiratory distress. She exhibits no tenderness.  Abdominal: Soft. Normal appearance and bowel sounds are normal. There is no hepatosplenomegaly. There is no tenderness. There is no rebound, no guarding, no tenderness at McBurney's point and negative  Murphy's sign. No hernia.  Musculoskeletal:       Right knee: She exhibits decreased range of motion, swelling, effusion and ecchymosis. Tenderness (patella) found.  Neurological: She is alert and oriented to person, place, and time. She has normal strength. No cranial nerve deficit or sensory deficit. Coordination normal. GCS eye subscore is 4. GCS verbal subscore is 5. GCS motor subscore is 6.  Skin: Skin is warm, dry and intact. No rash noted. No cyanosis.  Psychiatric: She has a normal mood and affect. Her speech is normal and behavior is normal. Thought content normal.  Nursing note and vitals reviewed.    ED Treatments / Results  Labs (all labs ordered are listed, but only abnormal results are displayed) Labs Reviewed - No data to display  EKG  EKG Interpretation None       Radiology Dg Knee Complete 4 Views Right  Result Date: 07/01/2017 CLINICAL DATA:  Right knee pain after trip and fall injury. Swelling. EXAM: RIGHT KNEE - COMPLETE 4+ VIEW COMPARISON:  04/09/2011 FINDINGS: Comminuted fractures of the mid and inferior body of the patella with displacement and distraction of the inferior fracture fragments. There is associated right knee effusion and prepatellar and infrapatellar soft tissue swelling/ hematoma. The distal femur, proximal  tibia, and proximal fibula appear intact. IMPRESSION: Comminuted and distracted fractures of the body and inferior patella with associated right knee effusion and soft tissue hematomas. Electronically Signed   By: Burman Nieves M.D.   On: 07/01/2017 01:06    Procedures Procedures (including critical care time)  Medications Ordered in ED Medications  ibuprofen (ADVIL,MOTRIN) 400 MG tablet (not administered)  HYDROmorphone (DILAUDID) injection 1 mg (not administered)  ondansetron (ZOFRAN-ODT) disintegrating tablet 8 mg (not administered)  ibuprofen (ADVIL,MOTRIN) tablet 400 mg (400 mg Oral Given 07/01/17 0023)     Initial Impression / Assessment and Plan / ED Course  I have reviewed the triage vital signs and the nursing notes.  Pertinent labs & imaging results that were available during my care of the patient were reviewed by me and considered in my medical decision making (see chart for details).     Patient presents with complaints of right knee pain after a fall. Patient fell directly on the knee and has swelling, bruising and decreased range of motion. X-ray shows comminuted and distracted patella fracture. She did not suffer any other injury from the fall. Patient provided analgesia and placed in a knee immobilizer, will follow-up with orthopedics in the office.  Final Clinical Impressions(s) / ED Diagnoses   Final diagnoses:  Closed displaced comminuted fracture of right patella, initial encounter    New Prescriptions New Prescriptions   No medications on file     Gilda Crease, MD 07/01/17 [phone]

## 2017-07-01 NOTE — ED Triage Notes (Signed)
Pt presents to the ED for assessment of right knee pain after having a trip and fall earlier tonight where EMS came out to lift her off the floor.  Pt states her knee started swelling and becoming more painful after EMs left.  Swelling noted.

## 2017-07-03 ENCOUNTER — Encounter (HOSPITAL_COMMUNITY): Payer: Self-pay

## 2017-07-03 NOTE — Pre-Procedure Instructions (Signed)
Linda Lowe  07/03/2017      [id number] - [place], [place] - [address] AT [facility] OF [address] [place] [address] Phone: [phone] Fax: [phone]  [id number] - [place], [place] - [address] AT [facility] [address] [place] [address] Phone: [phone] Fax: [phone]    Your procedure is scheduled on Oct 23.  Report to [facility] Admitting at 815 A.M.  Call this number if you have problems the morning of surgery:  [phone]   Remember:  Do not eat food or drink liquids after midnight.  Take these medicines the morning of surgery with A SIP OF WATER amlodipine (norvasc), Carvedilol (Coreg),  Hydrocodone (Norco) if needed, Oxybutynin (Ditropan-XL)   Stop taking aspirin, BC's, goody's, Herbal medications, Fish Oil, Ibuprofen, Advil, Motrin, Aleve, Naprosyn     How to Manage Your Diabetes Before and After Surgery  Why is it important to control my blood sugar before and after surgery? . Improving blood sugar levels before and after surgery helps healing and can limit problems. . A way of improving blood sugar control is eating a healthy diet by: o  Eating less sugar and carbohydrates o  Increasing activity/exercise o  Talking with your doctor about reaching your blood sugar goals . High blood sugars (greater than 180 mg/dL) can raise your risk of infections and slow your recovery, so you will need to focus on controlling your diabetes during the weeks before surgery. . Make sure that the doctor who takes care of your diabetes knows about your planned surgery including the date and location.  How do I manage my blood sugar before surgery? . Check your blood sugar at least 4 times a day, starting 2 days before surgery, to make sure that the level is not too high or low. o Check your blood sugar the morning of your surgery when you  wake up and every 2 hours until you get to the Short Stay unit. . If your blood sugar is less than 70 mg/dL, you will need to treat for low blood sugar: o Do not take insulin. o Treat a low blood sugar (less than 70 mg/dL) with  cup of clear juice (cranberry or apple), 4 glucose tablets, OR glucose gel. o Recheck blood sugar in 15 minutes after treatment (to make sure it is greater than 70 mg/dL). If your blood sugar is not greater than 70 mg/dL on recheck, call [phone] for further instructions. . Report your blood sugar to the short stay nurse when you get to Short Stay.  . If you are admitted to the hospital after surgery: o Your blood sugar will be checked by the staff and you will probably be given insulin after surgery (instead of oral diabetes medicines) to make sure you have good blood sugar levels. o The goal for blood sugar control after surgery is 80-180 mg/dL.              WHAT DO I DO ABOUT MY DIABETES MEDICATION?   Marland Kitchen Do not take oral diabetes medicines (pills) the morning of surgery. Invokamet  . THE NIGHT BEFORE SURGERY, take usual dose of insulin at supper of Novolog insulin . The night before surgery take 20 units of Toujeo      . HE MORNING OF SURGERY, take _____________ units of __________insulin.  . The day of surgery, do not take  other diabetes injectables, including Byetta (exenatide), Bydureon (exenatide ER), Victoza (liraglutide), or Trulicity (dulaglutide).  . If your CBG is greater than 220 mg/dL, you may take  of your sliding scale (correction) dose of insulin.  Other Instructions:          Patient Signature:  Date:   Nurse Signature:  Date:   Reviewed and Endorsed by [facility] Patient Education Committee, August 2015  Do not wear jewelry, make-up or nail polish.  Do not wear lotions, powders, or perfumes, or deoderant.  Do not shave 48 hours prior to surgery.  Men may shave face and neck.  Do not bring valuables to the  hospital.  [facility] is not responsible for any belongings or valuables.  Contacts, dentures or bridgework may not be worn into surgery.  Leave your suitcase in the car.  After surgery it may be brought to your room.  For patients admitted to the hospital, discharge time will be determined by your treatment team.  Patients discharged the day of surgery will not be allowed to drive home.    Special instructions:  [place] - Preparing for Surgery  Before surgery, you can play an important role.  Because skin is not sterile, your skin needs to be as free of germs as possible.  You can reduce the number of germs on you skin by washing with CHG (chlorahexidine gluconate) soap before surgery.  CHG is an antiseptic cleaner which kills germs and bonds with the skin to continue killing germs even after washing.  Please DO NOT use if you have an allergy to CHG or antibacterial soaps.  If your skin becomes reddened/irritated stop using the CHG and inform your nurse when you arrive at Short Stay.  Do not shave (including legs and underarms) for at least 48 hours prior to the first CHG shower.  You may shave your face.  Please follow these instructions carefully:   1.  Shower with CHG Soap the night before surgery and the                                morning of Surgery.  2.  If you choose to wash your hair, wash your hair first as usual with your       normal shampoo.  3.  After you shampoo, rinse your hair and body thoroughly to remove the                      Shampoo.  4.  Use CHG as you would any other liquid soap.  You can apply chg directly       to the skin and wash gently with scrungie or a clean washcloth.  5.  Apply the CHG Soap to your body ONLY FROM THE NECK DOWN.        Do not use on open wounds or open sores.  Avoid contact with your eyes,       ears, mouth and genitals (private parts).  Wash genitals (private parts)       with your normal soap.  6.  Wash thoroughly, paying special  attention to the area where your surgery        will be performed.  7.  Thoroughly rinse your body with warm water from the neck down.  8.  DO NOT shower/wash with your normal soap after using and rinsing off  the CHG Soap.  9.  Pat yourself dry with a clean towel.            10.  Wear clean pajamas.            11.  Place clean sheets on your bed the night of your first shower and do not        sleep with pets.  Day of Surgery  Do not apply any lotions/deoderants the morning of surgery.  Please wear clean clothes to the hospital/surgery center.     Please read over the following fact sheets that you were given. Pain Booklet, Coughing and Deep Breathing and Surgical Site Infection Prevention

## 2017-07-04 ENCOUNTER — Encounter (HOSPITAL_COMMUNITY): Payer: Self-pay | Admitting: *Deleted

## 2017-07-04 ENCOUNTER — Encounter (HOSPITAL_COMMUNITY)
Admission: RE | Admit: 2017-07-04 | Discharge: 2017-07-04 | Disposition: A | Discharge status: Home / Self Care | Payer: Federal, State, Local not specified - PPO | Source: Ambulatory Visit | Attending: Orthopedic Surgery | Admitting: Orthopedic Surgery

## 2017-07-04 DIAGNOSIS — S82041A Displaced comminuted fracture of right patella, initial encounter for closed fracture: Secondary | ICD-10-CM | POA: Diagnosis not present

## 2017-07-04 DIAGNOSIS — X58XXXA Exposure to other specified factors, initial encounter: Secondary | ICD-10-CM | POA: Insufficient documentation

## 2017-07-04 LAB — BASIC METABOLIC PANEL
Anion gap: 9 (ref 5–15)
BUN: 21 mg/dL — ABNORMAL HIGH (ref 6–20)
CO2: 23 mmol/L (ref 22–32)
Calcium: 9.1 mg/dL (ref 8.9–10.3)
Chloride: 103 mmol/L (ref 101–111)
Creatinine, Ser: 0.64 mg/dL (ref 0.44–1.00)
GFR calc Af Amer: >60 mL/min (ref >60)
GFR calc non Af Amer: >60 mL/min (ref >60)
Glucose, Bld: 140 mg/dL — ABNORMAL HIGH (ref 65–99)
Potassium: 4.3 mmol/L (ref 3.5–5.1)
Sodium: 135 mmol/L (ref 135–145)

## 2017-07-04 LAB — CBC
HCT: 41.8 % (ref 36.0–46.0)
Hemoglobin: 13.8 g/dL (ref 12.0–15.0)
MCH: 28.3 pg (ref 26.0–34.0)
MCHC: 33 g/dL (ref 30.0–36.0)
MCV: 85.8 fL (ref 78.0–100.0)
Platelets: 227 10*3/uL (ref 150–400)
RBC: 4.87 MIL/uL (ref 3.87–5.11)
RDW: 13.3 % (ref 11.5–15.5)
WBC: 7.8 10*3/uL (ref 4.0–10.5)

## 2017-07-04 LAB — HEMOGLOBIN A1C
Hgb A1c MFr Bld: 9.2 % — ABNORMAL HIGH (ref 4.8–5.6)
Mean Plasma Glucose: 217.34 mg/dL

## 2017-07-04 LAB — GLUCOSE, CAPILLARY: Glucose-Capillary: 151 mg/dL — ABNORMAL HIGH (ref 65–99)

## 2017-07-04 NOTE — Progress Notes (Signed)
PCP is Dr. Renaye Rakers Dr Lucianne Muss manages her DM Cardiologist is Dr Sherril Croon (also Manages her Defibrillator)  Defibrillator  is ST Jude Denies any chest pain, cough, or fever.  Echo noted from 03-22-17 Cath noted from 2013 Brian from [facility] called and informed of surgery date and time (message left)

## 2017-07-04 NOTE — Progress Notes (Signed)
Reports her fasting blood sugars run 190's or better.

## 2017-07-07 ENCOUNTER — Other Ambulatory Visit: Payer: Self-pay | Admitting: Endocrinology

## 2017-07-07 ENCOUNTER — Telehealth: Payer: Self-pay | Admitting: Endocrinology

## 2017-07-07 MED ORDER — LACTATED RINGERS IV SOLN
INTRAVENOUS | Status: DC
  Administered 2017-07-08 (×2): via INTRAVENOUS

## 2017-07-07 MED ORDER — ACETAMINOPHEN 500 MG PO TABS
1000.0000 mg | ORAL_TABLET | Freq: Once | ORAL | Status: AC
  Administered 2017-07-08: 1000 mg via ORAL
  Filled 2017-07-07: qty 2

## 2017-07-07 MED ORDER — CEFAZOLIN SODIUM-DEXTROSE 2-4 GM/100ML-% IV SOLN
2.0000 g | INTRAVENOUS | Status: AC
  Administered 2017-07-08: 2 g via INTRAVENOUS
  Filled 2017-07-07: qty 100

## 2017-07-07 NOTE — H&P (Signed)
[facility]  [address]   [address] [place] [address] [phone] A Division of [facility]  RE: Linda Lowe, Linda Lowe   [id number]      DOB: 1951-05-09 INITIAL EVALUATION:  07-02-17  REASON FOR VISIT: New evaluation of an acute traumatic right patella fracture from a fall with direct contact in her kitchen 06-30-17.   HPI:   This is an acute traumatic problem that began 06-30-17. She was walking in her kitchen and slipped and fell directly onto the anterior aspect of her right knee. She has the inability to extend her leg. She presented to the emergency room where X-rays confirm a comminuted and distracted fracture of the body and inferior patella with associated right knee effusion, soft tissue hematomas. She was placed in a knee immobilizer and referred here for a follow-up. Today her pain is fairly well controlled with Hydrocodone. No distal numbness or paresthesia.   Past medical history: Significant for heart failure and pacemaker placement three years ago. Followed by Dr. Jacinto Halim. She denies a history of a DVT, PE, MI or CVA.   She does have diabetes with most recent A1C in July of 8.3. She is a nonsmoker.    EXAMINATION: Well appearing female no apparent distress. Knee immobilizer in place on arrival. The right knee with moderate expected swelling and ecchymosis. Moderate tenderness to palpation and movement of the knee. The range of motion and strength not tested. She is unable to hold extension. There is a palpable deformity about her patella. Neurovascularly intact distally.   IMAGES: X-rays reviewed by me:   No images taken today. Previous X-rays reviewed and discussed above.   ASSESSMENT & PLAN: We discussed the details, risks and benefits of open reduction and internal fixation along with the pre and post operative course with the patient and Dr. Eulah Pont. She and her husband verbalize understanding and wish to  proceed. She will follow-up post operatively. She knows the history of significant headaches and was told not to take aspirin. We may place her on Xarelto or Lovenox post operatively.    Jewel Baize.  Eulah Pont, M.D.  dictated by Avis Epley, PA-C Electronically verified by Jewel Baize Eulah Pont, M.D. TDM:HM:jgc D 07-03-17  T  07-07-17 cc:  Renaye Rakers, MD fax [phone]

## 2017-07-07 NOTE — Telephone Encounter (Signed)
This is in stack of papers completed this morning

## 2017-07-07 NOTE — Progress Notes (Signed)
Anesthesia Chart Review:  Pt is a 66 year old female scheduled for ORIF R patella on 07/08/2017 with Margarita Rana, MD.   - PCP is Renaye Rakers, MD - Endocrinologist is Reather Littler, MD - Cardiologist is Delrae Rend, MD, last office visit 12/12/16.   PMH includes:  Nonischemic cardiomyopathy, AICD (St. Jude, implanted 02/02/14), pericardial effusion (2013), HTN, DM, hyperlipidemia. Former smoker. BMI 29  - Hospitalized 7/7-1/18 for sepsis, diarrhea, hyponatremia.   Medications include: amlodipine, lipitor, invokamet, carvedilol, lasix, novolog, irbesartan, potassium, toujeo.    BP 115/78   Pulse 60   Temp 36.4 C   Resp 18   Ht 5' 3.5" (1.613 m)   Wt 167 lb 8 oz (76 kg)   SpO2 95%   BMI 29.21 kg/m   Preoperative labs reviewed.  HbA1c 9.2, glucose 140.   CT chest 09/16/16: The lungs are clear except for stable basilar scarring. No consolidation or effusion.  EKG 03/22/17: Sinus rhythm. Nonspecific intraventricular conduction delay. Minimal ST depression, inferior leads  Echo 03/22/17:  - Left ventricle: The cavity size was normal. Wall thickness was increased in a pattern of moderate LVH. There was moderate concentric hypertrophy. Systolic function was normal. The estimated ejection fraction was in the range of 55% to 60%. Doppler parameters are consistent with abnormal left ventricular relaxation (grade 1 diastolic dysfunction). - Left atrium: The atrium was mildly dilated.  Cardiac cath 05/26/12:  1. No significant coronary artery disease 2. Severe left ventricular dysfunction 3. Normal intracardiac hemodynamics  Perioperative prescription for AICD pending. Dr. Verl Dicker last office visit note indicates device last checked 06/18/17, BiV paced 100%  I left a voicemail for [facility] in Dr. Greig Right office about uncontrolled DM.   If no changes, I anticipate pt can proceed with surgery as scheduled.    Rica Mast, FNP-BC [facility] Short Stay Surgical Center/Anesthesiology Phone:  [phone] 07/07/2017 2:11 PM

## 2017-07-07 NOTE — Telephone Encounter (Signed)
Called Linda Lowe and left a voice message for Linda Lowe to let her know that I have faxed over the surgical clearance paperwork and to call us back if she has not received this paperwork.  Called patient and let her know that I have faxed over this paperwork for her to [facility].

## 2017-07-07 NOTE — Telephone Encounter (Signed)
Patient is calling to follow up on the status of urgent request for surgical clearance. Please advise

## 2017-07-07 NOTE — Telephone Encounter (Signed)
Kellie from [facility] called ask you to fax the urgent surgical clearance information to. fax # [phone]

## 2017-07-07 NOTE — Telephone Encounter (Signed)
Did you receive paperwork for this patient for surgical clearance? Please advise.

## 2017-07-08 ENCOUNTER — Observation Stay (HOSPITAL_COMMUNITY): Payer: Federal, State, Local not specified - PPO

## 2017-07-08 ENCOUNTER — Ambulatory Visit (HOSPITAL_COMMUNITY): Payer: Federal, State, Local not specified - PPO | Admitting: Emergency Medicine

## 2017-07-08 ENCOUNTER — Encounter (HOSPITAL_COMMUNITY): Payer: Self-pay | Admitting: *Deleted

## 2017-07-08 ENCOUNTER — Observation Stay (HOSPITAL_COMMUNITY)
Admission: RE | Admit: 2017-07-08 | Discharge: 2017-07-09 | Disposition: A | Discharge status: Home / Self Care | Payer: Federal, State, Local not specified - PPO | Source: Ambulatory Visit | Attending: Orthopedic Surgery | Admitting: Orthopedic Surgery

## 2017-07-08 ENCOUNTER — Encounter (HOSPITAL_COMMUNITY)
Admission: RE | Disposition: A | Discharge status: Home / Self Care | Payer: Self-pay | Source: Ambulatory Visit | Attending: Orthopedic Surgery

## 2017-07-08 DIAGNOSIS — Z950 Presence of cardiac pacemaker: Secondary | ICD-10-CM | POA: Insufficient documentation

## 2017-07-08 DIAGNOSIS — E1165 Type 2 diabetes mellitus with hyperglycemia: Secondary | ICD-10-CM | POA: Diagnosis not present

## 2017-07-08 DIAGNOSIS — I428 Other cardiomyopathies: Secondary | ICD-10-CM

## 2017-07-08 DIAGNOSIS — Z87891 Personal history of nicotine dependence: Secondary | ICD-10-CM | POA: Insufficient documentation

## 2017-07-08 DIAGNOSIS — S82041A Displaced comminuted fracture of right patella, initial encounter for closed fracture: Secondary | ICD-10-CM | POA: Diagnosis not present

## 2017-07-08 DIAGNOSIS — W010XXA Fall on same level from slipping, tripping and stumbling without subsequent striking against object, initial encounter: Secondary | ICD-10-CM | POA: Diagnosis not present

## 2017-07-08 DIAGNOSIS — E785 Hyperlipidemia, unspecified: Secondary | ICD-10-CM | POA: Diagnosis present

## 2017-07-08 DIAGNOSIS — I5022 Chronic systolic (congestive) heart failure: Secondary | ICD-10-CM | POA: Diagnosis present

## 2017-07-08 DIAGNOSIS — I110 Hypertensive heart disease with heart failure: Secondary | ICD-10-CM | POA: Diagnosis not present

## 2017-07-08 DIAGNOSIS — I10 Essential (primary) hypertension: Secondary | ICD-10-CM | POA: Diagnosis present

## 2017-07-08 DIAGNOSIS — Z794 Long term (current) use of insulin: Secondary | ICD-10-CM

## 2017-07-08 DIAGNOSIS — S82001A Unspecified fracture of right patella, initial encounter for closed fracture: Secondary | ICD-10-CM

## 2017-07-08 HISTORY — DX: Presence of automatic (implantable) cardiac defibrillator: Z95.810

## 2017-07-08 HISTORY — DX: Unspecified fracture of right patella, initial encounter for closed fracture: S82.001A

## 2017-07-08 HISTORY — PX: ORIF PATELLA: SHX5033

## 2017-07-08 HISTORY — DX: Cardiac arrhythmia, unspecified: I49.9

## 2017-07-08 HISTORY — DX: Dyspnea, unspecified: R06.00

## 2017-07-08 LAB — GLUCOSE, CAPILLARY
Glucose-Capillary: 249 mg/dL — ABNORMAL HIGH (ref 65–99)
Glucose-Capillary: 155 mg/dL — ABNORMAL HIGH (ref 65–99)
Glucose-Capillary: 274 mg/dL — ABNORMAL HIGH (ref 65–99)
Glucose-Capillary: 85 mg/dL (ref 65–99)

## 2017-07-08 SURGERY — OPEN REDUCTION INTERNAL FIXATION (ORIF) PATELLA
Anesthesia: Spinal | Site: Knee | Laterality: Right

## 2017-07-08 MED ORDER — AMLODIPINE BESYLATE 5 MG PO TABS
5.0000 mg | ORAL_TABLET | Freq: Every day | ORAL | Status: DC
  Administered 2017-07-09: 5 mg via ORAL
  Filled 2017-07-08: qty 1

## 2017-07-08 MED ORDER — OXYCODONE HCL 5 MG PO TABS: 5.0000 mg | ORAL_TABLET | ORAL | Status: DC | PRN

## 2017-07-08 MED ORDER — METOCLOPRAMIDE HCL 5 MG/ML IJ SOLN: 5.0000 mg | Freq: Three times a day (TID) | INTRAMUSCULAR | Status: DC | PRN

## 2017-07-08 MED ORDER — DOCUSATE SODIUM 100 MG PO CAPS
100.0000 mg | ORAL_CAPSULE | Freq: Two times a day (BID) | ORAL | Status: DC
  Administered 2017-07-08 – 2017-07-09 (×2): 100 mg via ORAL
  Filled 2017-07-08 (×2): qty 1

## 2017-07-08 MED ORDER — MIDAZOLAM HCL 2 MG/2ML IJ SOLN
INTRAMUSCULAR | Status: AC
  Filled 2017-07-08: qty 2

## 2017-07-08 MED ORDER — ASPIRIN EC 325 MG PO TBEC: 325.0000 mg | DELAYED_RELEASE_TABLET | Freq: Every day | ORAL | 0 refills | Status: DC

## 2017-07-08 MED ORDER — KETOROLAC TROMETHAMINE 15 MG/ML IJ SOLN
7.5000 mg | Freq: Four times a day (QID) | INTRAMUSCULAR | Status: AC
  Administered 2017-07-08 – 2017-07-09 (×4): 7.5 mg via INTRAVENOUS
  Filled 2017-07-08 (×3): qty 1

## 2017-07-08 MED ORDER — OXYCODONE HCL 5 MG PO TABS
10.0000 mg | ORAL_TABLET | ORAL | Status: DC | PRN
  Administered 2017-07-08 – 2017-07-09 (×4): 10 mg via ORAL
  Filled 2017-07-08 (×3): qty 2

## 2017-07-08 MED ORDER — INSULIN ASPART 100 UNIT/ML ~~LOC~~ SOLN
SUBCUTANEOUS | Status: AC
  Filled 2017-07-08: qty 1

## 2017-07-08 MED ORDER — PHENYLEPHRINE HCL 10 MG/ML IJ SOLN
INTRAVENOUS | Status: DC | PRN
  Administered 2017-07-08: 20 ug/min via INTRAVENOUS

## 2017-07-08 MED ORDER — LIRAGLUTIDE 18 MG/3ML ~~LOC~~ SOPN: 1.8000 mg | PEN_INJECTOR | Freq: Every day | SUBCUTANEOUS | Status: DC

## 2017-07-08 MED ORDER — FENTANYL CITRATE (PF) 100 MCG/2ML IJ SOLN
INTRAMUSCULAR | Status: AC
  Filled 2017-07-08: qty 2

## 2017-07-08 MED ORDER — METHOCARBAMOL 500 MG PO TABS
ORAL_TABLET | ORAL | Status: AC
Start: 2017-07-08 — End: 2017-07-09
  Filled 2017-07-08: qty 1

## 2017-07-08 MED ORDER — OXYBUTYNIN CHLORIDE ER 5 MG PO TB24
5.0000 mg | ORAL_TABLET | Freq: Every day | ORAL | Status: DC
  Administered 2017-07-08 – 2017-07-09 (×2): 5 mg via ORAL
  Filled 2017-07-08 (×2): qty 1

## 2017-07-08 MED ORDER — 0.9 % SODIUM CHLORIDE (POUR BTL) OPTIME
TOPICAL | Status: DC | PRN
  Administered 2017-07-08: 1000 mL

## 2017-07-08 MED ORDER — ATORVASTATIN CALCIUM 40 MG PO TABS
40.0000 mg | ORAL_TABLET | Freq: Every day | ORAL | Status: DC
  Administered 2017-07-08: 40 mg via ORAL
  Filled 2017-07-08: qty 1

## 2017-07-08 MED ORDER — CEFAZOLIN SODIUM-DEXTROSE 1-4 GM/50ML-% IV SOLN
1.0000 g | Freq: Four times a day (QID) | INTRAVENOUS | Status: AC
  Administered 2017-07-08 – 2017-07-09 (×3): 1 g via INTRAVENOUS
  Filled 2017-07-08 (×3): qty 50

## 2017-07-08 MED ORDER — OXYCODONE HCL 5 MG PO TABS: 5.0000 mg | ORAL_TABLET | Freq: Once | ORAL | Status: DC | PRN

## 2017-07-08 MED ORDER — LACTATED RINGERS IV SOLN
INTRAVENOUS | Status: DC
  Administered 2017-07-08 (×2): via INTRAVENOUS

## 2017-07-08 MED ORDER — INSULIN ASPART 100 UNIT/ML ~~LOC~~ SOLN
16.0000 [IU] | Freq: Two times a day (BID) | SUBCUTANEOUS | Status: DC
  Administered 2017-07-08 – 2017-07-09 (×2): 16 [IU] via SUBCUTANEOUS

## 2017-07-08 MED ORDER — ONDANSETRON HCL 4 MG PO TABS: 4.0000 mg | ORAL_TABLET | Freq: Four times a day (QID) | ORAL | Status: DC | PRN

## 2017-07-08 MED ORDER — FENTANYL CITRATE (PF) 250 MCG/5ML IJ SOLN
INTRAMUSCULAR | Status: AC
  Filled 2017-07-08: qty 5

## 2017-07-08 MED ORDER — CHLORHEXIDINE GLUCONATE 4 % EX LIQD: 60.0000 mL | Freq: Once | CUTANEOUS | Status: DC

## 2017-07-08 MED ORDER — IRBESARTAN 300 MG PO TABS
300.0000 mg | ORAL_TABLET | Freq: Every day | ORAL | Status: DC
  Administered 2017-07-08 – 2017-07-09 (×2): 300 mg via ORAL
  Filled 2017-07-08 (×2): qty 1

## 2017-07-08 MED ORDER — CARVEDILOL 25 MG PO TABS
25.0000 mg | ORAL_TABLET | Freq: Two times a day (BID) | ORAL | Status: DC
  Administered 2017-07-08 – 2017-07-09 (×2): 25 mg via ORAL
  Filled 2017-07-08 (×2): qty 1

## 2017-07-08 MED ORDER — HYDROMORPHONE HCL 1 MG/ML IJ SOLN
0.2500 mg | INTRAMUSCULAR | Status: DC | PRN
  Administered 2017-07-08: 1 mg via INTRAVENOUS
  Administered 2017-07-08 (×2): 0.5 mg via INTRAVENOUS

## 2017-07-08 MED ORDER — METFORMIN HCL 500 MG PO TABS
1000.0000 mg | ORAL_TABLET | Freq: Two times a day (BID) | ORAL | Status: DC
  Administered 2017-07-09: 1000 mg via ORAL
  Filled 2017-07-08: qty 2

## 2017-07-08 MED ORDER — PROPOFOL 10 MG/ML IV BOLUS
INTRAVENOUS | Status: DC | PRN
  Administered 2017-07-08 (×2): 20 mg via INTRAVENOUS

## 2017-07-08 MED ORDER — SENNA 8.6 MG PO TABS
1.0000 | ORAL_TABLET | Freq: Two times a day (BID) | ORAL | Status: DC
  Administered 2017-07-09: 8.6 mg via ORAL
  Filled 2017-07-08: qty 1

## 2017-07-08 MED ORDER — DIPHENHYDRAMINE HCL 12.5 MG/5ML PO ELIX: 12.5000 mg | ORAL_SOLUTION | ORAL | Status: DC | PRN

## 2017-07-08 MED ORDER — HYDROMORPHONE HCL 1 MG/ML IJ SOLN
INTRAMUSCULAR | Status: AC
  Filled 2017-07-08: qty 1

## 2017-07-08 MED ORDER — DOCUSATE SODIUM 100 MG PO CAPS
100.0000 mg | ORAL_CAPSULE | Freq: Two times a day (BID) | ORAL | 0 refills | Status: DC
Start: 2017-07-08 — End: 2020-03-22

## 2017-07-08 MED ORDER — HYDROMORPHONE HCL 1 MG/ML IJ SOLN: 0.5000 mg | INTRAMUSCULAR | Status: DC | PRN

## 2017-07-08 MED ORDER — FENTANYL CITRATE (PF) 250 MCG/5ML IJ SOLN
INTRAMUSCULAR | Status: DC | PRN
  Administered 2017-07-08 (×2): 50 ug via INTRAVENOUS

## 2017-07-08 MED ORDER — PROPOFOL 500 MG/50ML IV EMUL
INTRAVENOUS | Status: DC | PRN
  Administered 2017-07-08: 50 ug/kg/min via INTRAVENOUS

## 2017-07-08 MED ORDER — OMEPRAZOLE 20 MG PO CPDR: 20.0000 mg | DELAYED_RELEASE_CAPSULE | Freq: Every day | ORAL | 0 refills | Status: DC

## 2017-07-08 MED ORDER — METHOCARBAMOL 500 MG PO TABS
500.0000 mg | ORAL_TABLET | Freq: Four times a day (QID) | ORAL | Status: DC | PRN
  Administered 2017-07-08 – 2017-07-09 (×4): 500 mg via ORAL
  Filled 2017-07-08 (×3): qty 1

## 2017-07-08 MED ORDER — ENOXAPARIN SODIUM 40 MG/0.4ML ~~LOC~~ SOLN
40.0000 mg | SUBCUTANEOUS | Status: DC
  Administered 2017-07-09: 40 mg via SUBCUTANEOUS
  Filled 2017-07-08: qty 0.4

## 2017-07-08 MED ORDER — PROPOFOL 10 MG/ML IV BOLUS
INTRAVENOUS | Status: AC
  Filled 2017-07-08: qty 20

## 2017-07-08 MED ORDER — CANAGLIFLOZIN 300 MG PO TABS
300.0000 mg | ORAL_TABLET | Freq: Every day | ORAL | Status: DC
  Administered 2017-07-09: 300 mg via ORAL
  Filled 2017-07-08: qty 1

## 2017-07-08 MED ORDER — OXYCODONE HCL 5 MG PO TABS
ORAL_TABLET | ORAL | Status: AC
  Filled 2017-07-08: qty 2

## 2017-07-08 MED ORDER — KETOROLAC TROMETHAMINE 15 MG/ML IJ SOLN
INTRAMUSCULAR | Status: AC
  Filled 2017-07-08: qty 1

## 2017-07-08 MED ORDER — POLYETHYLENE GLYCOL 3350 17 G PO PACK: 17.0000 g | PACK | Freq: Every day | ORAL | Status: DC | PRN

## 2017-07-08 MED ORDER — ONDANSETRON HCL 4 MG/2ML IJ SOLN: 4.0000 mg | Freq: Four times a day (QID) | INTRAMUSCULAR | Status: DC | PRN

## 2017-07-08 MED ORDER — KETOROLAC TROMETHAMINE 30 MG/ML IJ SOLN
INTRAMUSCULAR | Status: AC
  Filled 2017-07-08: qty 1

## 2017-07-08 MED ORDER — LIDOCAINE 2% (20 MG/ML) 5 ML SYRINGE
INTRAMUSCULAR | Status: DC | PRN
  Administered 2017-07-08: 40 mg via INTRAVENOUS

## 2017-07-08 MED ORDER — BACLOFEN 10 MG PO TABS: 10.0000 mg | ORAL_TABLET | Freq: Three times a day (TID) | ORAL | 0 refills | Status: DC | PRN

## 2017-07-08 MED ORDER — BUPIVACAINE IN DEXTROSE 0.75-8.25 % IT SOLN
INTRATHECAL | Status: DC | PRN
  Administered 2017-07-08: 2 mL via INTRATHECAL

## 2017-07-08 MED ORDER — POTASSIUM CHLORIDE CRYS ER 20 MEQ PO TBCR
20.0000 meq | EXTENDED_RELEASE_TABLET | Freq: Every day | ORAL | Status: DC
  Administered 2017-07-08 – 2017-07-09 (×2): 20 meq via ORAL
  Filled 2017-07-08 (×2): qty 1

## 2017-07-08 MED ORDER — METOCLOPRAMIDE HCL 5 MG PO TABS: 5.0000 mg | ORAL_TABLET | Freq: Three times a day (TID) | ORAL | Status: DC | PRN

## 2017-07-08 MED ORDER — MIDAZOLAM HCL 2 MG/2ML IJ SOLN
INTRAMUSCULAR | Status: DC | PRN
  Administered 2017-07-08 (×2): 1 mg via INTRAVENOUS

## 2017-07-08 MED ORDER — ACETAMINOPHEN 500 MG PO TABS
1000.0000 mg | ORAL_TABLET | Freq: Three times a day (TID) | ORAL | Status: AC
  Administered 2017-07-08 – 2017-07-09 (×3): 1000 mg via ORAL
  Filled 2017-07-08 (×3): qty 2

## 2017-07-08 MED ORDER — METHOCARBAMOL 1000 MG/10ML IJ SOLN
500.0000 mg | Freq: Four times a day (QID) | INTRAVENOUS | Status: DC | PRN
  Filled 2017-07-08: qty 5

## 2017-07-08 MED ORDER — INSULIN ASPART 100 UNIT/ML ~~LOC~~ SOLN
5.0000 [IU] | Freq: Once | SUBCUTANEOUS | Status: AC
  Administered 2017-07-08: 5 [IU] via SUBCUTANEOUS

## 2017-07-08 MED ORDER — ONDANSETRON HCL 4 MG/2ML IJ SOLN
INTRAMUSCULAR | Status: DC | PRN
  Administered 2017-07-08: 4 mg via INTRAVENOUS

## 2017-07-08 MED ORDER — FUROSEMIDE 20 MG PO TABS
20.0000 mg | ORAL_TABLET | Freq: Two times a day (BID) | ORAL | Status: DC
  Administered 2017-07-08 – 2017-07-09 (×2): 20 mg via ORAL
  Filled 2017-07-08 (×2): qty 1

## 2017-07-08 MED ORDER — ACETAMINOPHEN 650 MG RE SUPP
650.0000 mg | RECTAL | Status: DC | PRN
Start: 2017-07-08 — End: 2017-07-09

## 2017-07-08 MED ORDER — OXYCODONE-ACETAMINOPHEN 5-325 MG PO TABS: 1.0000 | ORAL_TABLET | ORAL | 0 refills | Status: DC | PRN

## 2017-07-08 MED ORDER — OXYCODONE HCL 5 MG/5ML PO SOLN
5.0000 mg | Freq: Once | ORAL | Status: DC | PRN
Start: 2017-07-08 — End: 2017-07-08

## 2017-07-08 MED ORDER — CANAGLIFLOZIN-METFORMIN HCL 150-1000 MG PO TABS: 1.0000 | ORAL_TABLET | Freq: Two times a day (BID) | ORAL | Status: DC

## 2017-07-08 MED ORDER — ONDANSETRON HCL 4 MG PO TABS: 4.0000 mg | ORAL_TABLET | Freq: Three times a day (TID) | ORAL | 0 refills | Status: DC | PRN

## 2017-07-08 MED ORDER — ACETAMINOPHEN 325 MG PO TABS: 650.0000 mg | ORAL_TABLET | ORAL | Status: DC | PRN

## 2017-07-08 SURGICAL SUPPLY — 72 items
BANDAGE ACE 4X5 VEL STRL LF (GAUZE/BANDAGES/DRESSINGS) ×2 IMPLANT
BANDAGE ACE 6X5 VEL STRL LF (GAUZE/BANDAGES/DRESSINGS) ×2 IMPLANT
BANDAGE ELASTIC 4 VELCRO ST LF (GAUZE/BANDAGES/DRESSINGS) ×1 IMPLANT
BANDAGE ELASTIC 6 VELCRO ST LF (GAUZE/BANDAGES/DRESSINGS) ×1 IMPLANT
BIT DRILL CANN 2.7 (BIT) ×2
BIT DRILL SRG 2.7XCANN AO CPLG (BIT) IMPLANT
BIT DRL SRG 2.7XCANN AO CPLNG (BIT) ×1
BLADE CLIPPER SURG (BLADE) ×2 IMPLANT
BOOTCOVER CLEANROOM LRG (PROTECTIVE WEAR) ×2 IMPLANT
CANISTER SUCT 3000ML PPV (MISCELLANEOUS) ×1 IMPLANT
CAST PADDING SYN 3 (CAST SUPPLIES) ×1 IMPLANT
COVER SURGICAL LIGHT HANDLE (MISCELLANEOUS) ×2 IMPLANT
CUFF TOURNIQUET SINGLE 34IN LL (TOURNIQUET CUFF) ×1 IMPLANT
CUFF TOURNIQUET SINGLE 44IN (TOURNIQUET CUFF) IMPLANT
DRAPE C-ARM 42X72 X-RAY (DRAPES) ×1 IMPLANT
DRAPE OEC MINIVIEW 54X84 (DRAPES) ×1 IMPLANT
DRSG ADAPTIC 3X8 NADH LF (GAUZE/BANDAGES/DRESSINGS) ×2 IMPLANT
DRSG EMULSION OIL 3X3 NADH (GAUZE/BANDAGES/DRESSINGS) ×2 IMPLANT
DRSG PAD ABDOMINAL 8X10 ST (GAUZE/BANDAGES/DRESSINGS) ×2 IMPLANT
ELECT REM PT RETURN 9FT ADLT (ELECTROSURGICAL) ×2
ELECTRODE REM PT RTRN 9FT ADLT (ELECTROSURGICAL) ×1 IMPLANT
FACESHIELD WRAPAROUND (MASK) IMPLANT
FACESHIELD WRAPAROUND OR TEAM (MASK) ×1 IMPLANT
GAUZE SPONGE 4X4 16PLY XRAY LF (GAUZE/BANDAGES/DRESSINGS) ×1 IMPLANT
GAUZE XEROFORM 1X8 LF (GAUZE/BANDAGES/DRESSINGS) ×1 IMPLANT
GLOVE BIOGEL PI IND STRL 7.0 (GLOVE) ×1 IMPLANT
GLOVE BIOGEL PI INDICATOR 7.0 (GLOVE) ×1
GLOVE ECLIPSE 7.0 STRL STRAW (GLOVE) ×2 IMPLANT
GLOVE ORTHO TXT STRL SZ7.5 (GLOVE) ×4 IMPLANT
GOWN STRL REUS W/ TWL LRG LVL3 (GOWN DISPOSABLE) ×2 IMPLANT
GOWN STRL REUS W/ TWL XL LVL3 (GOWN DISPOSABLE) ×1 IMPLANT
GOWN STRL REUS W/TWL LRG LVL3 (GOWN DISPOSABLE) ×4
GOWN STRL REUS W/TWL XL LVL3 (GOWN DISPOSABLE) ×2
K-WIRE ORTHOPEDIC 1.4X150L (WIRE) ×4
KIT BASIN OR (CUSTOM PROCEDURE TRAY) ×2 IMPLANT
KIT ROOM TURNOVER OR (KITS) ×2 IMPLANT
KWIRE ORTHOPEDIC 1.4X150L (WIRE) IMPLANT
MANIFOLD NEPTUNE II (INSTRUMENTS) ×1 IMPLANT
NDL KEITH (NEEDLE) IMPLANT
NEEDLE 22X1 1/2 (OR ONLY) (NEEDLE) IMPLANT
NEEDLE KEITH (NEEDLE) ×2 IMPLANT
NS IRRIG 1000ML POUR BTL (IV SOLUTION) ×2 IMPLANT
PACK ORTHO EXTREMITY (CUSTOM PROCEDURE TRAY) ×2 IMPLANT
PAD ARMBOARD 7.5X6 YLW CONV (MISCELLANEOUS) ×4 IMPLANT
PAD CAST 4YDX4 CTTN HI CHSV (CAST SUPPLIES) ×2 IMPLANT
PADDING CAST COTTON 4X4 STRL (CAST SUPPLIES) ×4
PADDING CAST SYNTHETIC 4 (CAST SUPPLIES) ×1
PADDING CAST SYNTHETIC 4X4 STR (CAST SUPPLIES) IMPLANT
SCREW CANNULATED 4.0X36MM PT (Screw) ×1 IMPLANT
SCREW CANNULATED 4.0X38MM (Screw) ×1 IMPLANT
SPONGE LAP 4X18 X RAY DECT (DISPOSABLE) ×4 IMPLANT
STAPLER VISISTAT 35W (STAPLE) ×2 IMPLANT
SUCTION FRAZIER HANDLE 10FR (MISCELLANEOUS) ×1
SUCTION TUBE FRAZIER 10FR DISP (MISCELLANEOUS) ×1 IMPLANT
SUT FIBERWIRE #2 38 REV NDL BL (SUTURE)
SUT VIC AB 0 CT1 27 (SUTURE) ×4
SUT VIC AB 0 CT1 27XBRD ANBCTR (SUTURE) ×1 IMPLANT
SUT VIC AB 2-0 CT1 27 (SUTURE)
SUT VIC AB 2-0 CT1 TAPERPNT 27 (SUTURE) IMPLANT
SUT VIC AB 2-0 SH 27 (SUTURE)
SUT VIC AB 2-0 SH 27XBRD (SUTURE) IMPLANT
SUT VIC AB 3-0 SH 27 (SUTURE)
SUT VIC AB 3-0 SH 27X BRD (SUTURE) IMPLANT
SUTURE FIBERWR#2 38 REV NDL BL (SUTURE) IMPLANT
SYR CONTROL 10ML LL (SYRINGE) IMPLANT
TOWEL OR 17X24 6PK STRL BLUE (TOWEL DISPOSABLE) ×2 IMPLANT
TOWEL OR 17X26 10 PK STRL BLUE (TOWEL DISPOSABLE) ×2 IMPLANT
TUBE CONNECTING 12X1/4 (SUCTIONS) ×2 IMPLANT
UNDERPAD 30X30 (UNDERPADS AND DIAPERS) ×2 IMPLANT
WASHER ASNIS III 4.0 SCR (Washer) ×2 IMPLANT
WATER STERILE IRR 1000ML POUR (IV SOLUTION) ×1 IMPLANT
YANKAUER SUCT BULB TIP NO VENT (SUCTIONS) IMPLANT

## 2017-07-08 NOTE — Anesthesia Postprocedure Evaluation (Signed)
Anesthesia Post Note  Patient: Linda Lowe  Procedure(s) Performed: OPEN REDUCTION INTERNAL (ORIF) FIXATION PATELLA (Right Knee)     Patient location during evaluation: PACU Anesthesia Type: Spinal Level of consciousness: oriented and awake and alert Pain management: pain level controlled Vital Signs Assessment: post-procedure vital signs reviewed and stable Respiratory status: spontaneous breathing, respiratory function stable and patient connected to nasal cannula oxygen Cardiovascular status: blood pressure returned to baseline and stable Postop Assessment: no headache, no backache and no apparent nausea or vomiting Anesthetic complications: no    Last Vitals:  Vitals:   07/08/17 1330 07/08/17 1453  BP:    Pulse: 65 67  Resp: 17 15  Temp:    SpO2: 99% 99%    Last Pain:  Vitals:   07/08/17 1453  TempSrc:   PainSc: 8                  Codie Krogh,JAMES TERRILL

## 2017-07-08 NOTE — Anesthesia Preprocedure Evaluation (Addendum)
Anesthesia Evaluation  Patient identified by MRN, date of birth, ID band Patient awake    Reviewed: Allergy & Precautions, NPO status , Patient's Chart, lab work & pertinent test results  Airway Mallampati: III  TM Distance: <3 FB Neck ROM: Full    Dental  (+) Teeth Intact, Dental Advisory Given, Chipped   Pulmonary shortness of breath, former smoker,    breath sounds clear to auscultation       Cardiovascular hypertension, + dysrhythmias + Cardiac Defibrillator  Rhythm:Regular Rate:Normal     Neuro/Psych    GI/Hepatic negative GI ROS, Neg liver ROS,   Endo/Other  diabetes, Poorly Controlled  Renal/GU      Musculoskeletal  (+) Arthritis ,   Abdominal   Peds  Hematology negative hematology ROS (+)   Anesthesia Other Findings   Reproductive/Obstetrics                            Anesthesia Physical Anesthesia Plan  ASA: III  Anesthesia Plan: Spinal   Post-op Pain Management:    Induction:   PONV Risk Score and Plan: 2 and Ondansetron, Dexamethasone and Treatment may vary due to age or medical condition  Airway Management Planned: Natural Airway and Simple Face Mask  Additional Equipment:   Intra-op Plan:   Post-operative Plan:   Informed Consent: I have reviewed the patients History and Physical, chart, labs and discussed the procedure including the risks, benefits and alternatives for the proposed anesthesia with the patient or authorized representative who has indicated his/her understanding and acceptance.     Plan Discussed with: CRNA  Anesthesia Plan Comments:         Anesthesia Quick Evaluation

## 2017-07-08 NOTE — Anesthesia Procedure Notes (Addendum)
Date/Time: 07/08/2017 10:17 AM Performed by: Minerva Ends Pre-anesthesia Checklist: Patient identified, Emergency Drugs available, Suction available, Patient being monitored and Timeout performed Patient Re-evaluated:Patient Re-evaluated prior to induction Oxygen Delivery Method: Simple face mask Placement Confirmation: positive ETCO2 and breath sounds checked- equal and bilateral Comments: Simple mask O2 for sedation/spinal

## 2017-07-08 NOTE — Interval H&P Note (Signed)
History and Physical Interval Note:  07/08/2017 9:57 AM  Linda Lowe  has presented today for surgery, with the diagnosis of RIGHT PATELLA FRACTURE  The various methods of treatment have been discussed with the patient and family. After consideration of risks, benefits and other options for treatment, the patient has consented to  Procedure(s): OPEN REDUCTION INTERNAL (ORIF) FIXATION PATELLA (Right) as a surgical intervention .  The patient's history has been reviewed, patient examined, no change in status, stable for surgery.  I have reviewed the patient's chart and labs.  Questions were answered to the patient's satisfaction.     MURPHY, TIMOTHY D

## 2017-07-08 NOTE — Transfer of Care (Signed)
Immediate Anesthesia Transfer of Care Note  Patient: Nataline L Wholey  Procedure(s) Performed: OPEN REDUCTION INTERNAL (ORIF) FIXATION PATELLA (Right Knee)  Patient Location: PACU  Anesthesia Type:Spinal  Level of Consciousness: sedated  Airway & Oxygen Therapy: Patient Spontanous Breathing and Patient connected to face mask oxygen  Post-op Assessment: Report given to RN and Post -op Vital signs reviewed and stable  Post vital signs: Reviewed and stable  Last Vitals:  Vitals:   07/08/17 0826  BP: 106/65  Pulse: 69  Resp: 18  Temp: 36.7 C  SpO2: 97%    Last Pain:  Vitals:   07/08/17 0835  TempSrc:   PainSc: 6       Patients Stated Pain Goal: 2 (07/08/17 0835)  Complications: No apparent anesthesia complications

## 2017-07-08 NOTE — Op Note (Addendum)
07/08/2017  11:30 AM  PATIENT:  Linda Lowe    PRE-OPERATIVE DIAGNOSIS:  RIGHT PATELLA FRACTURE  POST-OPERATIVE DIAGNOSIS:  Same  PROCEDURE:  OPEN REDUCTION INTERNAL (ORIF) FIXATION PATELLA  SURGEON:  Hedda Crumbley D, MD  PHYSICIAN ASSISTANT: Aquilla Hacker, PA-C, he was present and scrubbed throughout the case, critical for completion in a timely fashion, and for retraction, instrumentation, and closure.   ANESTHESIA:   General  PREOPERATIVE INDICATIONS:  Linda Lowe is a  66 y.o. female with a diagnosis of RIGHT PATELLA FRACTURE who elected for surgical management in order to restore the function of the extensor mechanism.    The risks benefits and alternatives were discussed with the patient preoperatively including but not limited to the risks of infection, bleeding, nerve injury, cardiopulmonary complications, the need for revision surgery, hardware prominence, hardware failure, the need for hardware removal, nonunion, malunion, posttraumatic arthritis, stiffness, loss of strength and function, among others, and the patient was willing to proceed.  OPERATIVE IMPLANTS: 4.0 mm cannulated screws x2 with a total of 2 #2 FiberWire going through the cannulated screws in a figure-of-eight cerclage fashion  OPERATIVE FINDINGS: Displaced patella fracture  OPERATIVE PROCEDURE: The patient was brought to the operating room and placed in the supine position. General anesthesia was administered. IV antibiotics were given. The lower extremity was prepped and draped in usual sterile fashion. The leg was elevated and exsanguinated and the tourniquet was inflated. Time out was performed.   Anterior incision was made over the patella and the fracture fragments identified and cleaned of hematoma. The retinaculum was torn on either side.  I reduced the fracture anatomically and held provisionally with a clamp and placed 2 guidewires for the cannulated screws.  The lengths were measured, after  being confirmed on C-arm, and then I placed the screws, taking care to make sure that there were threads only on the proximal segment, providing compression at the fracture site, and the tips were not prominent proximally.  C-arm used to confirm reduction and position of the screws, and once I was satisfied with this I then used a Keith needle through the screws bringing a total of 2 #2 FiberWire in a figure-of-eight type fashion. This provided excellent secondary fixation. I left the screw lengths slightly short of the far cortex in order to minimize the risk for rupture of the FiberWire over the tip of the screws.  The wounds were irrigated copiously and the collateral capsul was repaired with Vicryl followed by Vicryl for the subcutaneous tissue with Steri-Strips and sterile gauze for the skin. The wounds were also injected. A knee immobilizer was applied. The patient was awakened and returned to the PACU in stable and satisfactory condition. There were no complications.   POSTOPERATIVE PLAN: WBAT in knee immobilizer, DVT px: ambulation and chemical px.

## 2017-07-08 NOTE — Anesthesia Procedure Notes (Signed)
Spinal  Patient location during procedure: OR Start time: 07/08/2017 10:30 AM End time: 07/08/2017 10:35 AM Staffing Anesthesiologist: Sharee Holster Performed: anesthesiologist  Preanesthetic Checklist Completed: patient identified, site marked, surgical consent, pre-op evaluation, timeout performed, IV checked, risks and benefits discussed and monitors and equipment checked Spinal Block Patient position: sitting Prep: ChloraPrep Patient monitoring: heart rate, cardiac monitor, continuous pulse ox and blood pressure Approach: midline Location: L3-4 Injection technique: single-shot Needle Needle type: Pencan  Needle length: 9 cm Needle insertion depth: 5 cm Assessment Sensory level: T8

## 2017-07-09 ENCOUNTER — Encounter (HOSPITAL_COMMUNITY): Payer: Self-pay | Admitting: Orthopedic Surgery

## 2017-07-09 DIAGNOSIS — S82041A Displaced comminuted fracture of right patella, initial encounter for closed fracture: Secondary | ICD-10-CM | POA: Diagnosis not present

## 2017-07-09 LAB — GLUCOSE, CAPILLARY
Glucose-Capillary: 288 mg/dL — ABNORMAL HIGH (ref 65–99)
Glucose-Capillary: 253 mg/dL — ABNORMAL HIGH (ref 65–99)

## 2017-07-09 NOTE — Progress Notes (Signed)
   Assessment / Plan: 1 Day Post-Op  S/P Procedure(s) (LRB): OPEN REDUCTION INTERNAL (ORIF) FIXATION PATELLA (Right) by Dr. Jewel Baize. Eulah Pont on 07/08/17  Principal Problem:   Right patella fracture Active Problems:   Essential hypertension   Hyperlipidemia   Nonischemic cardiomyopathy (HCC)   Chronic systolic heart failure (HCC)   Uncontrolled type 2 diabetes mellitus with hyperglycemia, with long-term current use of insulin (HCC)   Right patella fracture s/p ORIF Must Keep knee immobilizer on at all times with leg in full extension Up with therapy D/C IV fluids Incentive Spirometry Elevate and apply ice  Weight Bearing: Weight Bearing as Tolerated (WBAT) in full extension Dressings: Maintain.  VTE prophylaxis: Lovenox while in patient, SCDs, ambulation.  ASA 325 mg daily for 30 days after discharge. Dispo: Home today after therapy evaluations  Subjective: Patient reports pain as mild.  Tolerating diet.  Urinating. No CP, SOB.  OOB in room to the bathroom.  Objective:   VITALS:   Vitals:   07/08/17 1730 07/08/17 1953 07/09/17 0101 07/09/17 0546  BP: (!) 159/75 (!) 150/69 130/63 (!) 144/62  Pulse: 71 68 63 75  Resp: 20 17 15 17   Temp: 98.9 F (37.2 C) 99.1 F (37.3 C) 98.9 F (37.2 C) 98.5 F (36.9 C)  TempSrc: Oral Oral Oral Oral  SpO2: 100% 99% 93% 95%   CBC Latest Ref Rng & Units 07/04/2017 03/26/2017 03/23/2017  WBC 4.0 - 10.5 K/uL 7.8 5.8 9.2  Hemoglobin 12.0 - 15.0 g/dL 05/24/2017 83.1 51.7  Hematocrit 36.0 - 46.0 % 41.8 37.9 36.4  Platelets 150 - 400 K/uL 227 254 160   BMP Latest Ref Rng & Units 07/04/2017 04/10/2017 03/26/2017  Glucose 65 - 99 mg/dL 05/27/2017) 073(X) 106(Y)  BUN 6 - 20 mg/dL 694(W) 10 7  Creatinine 0.44 - 1.00 mg/dL 54(O 2.70 3.50  Sodium 135 - 145 mmol/L 135 138 138  Potassium 3.5 - 5.1 mmol/L 4.3 4.6 3.9  Chloride 101 - 111 mmol/L 103 101 106  CO2 22 - 32 mmol/L 23 31 24   Calcium 8.9 - 10.3 mg/dL 9.1 9.8 8.9   Intake/Output      10/23 0701 -  10/24 0700 10/24 0701 - 10/25 0700   P.O. 240    I.V. 800    Total Intake 1040     Urine 0    Blood 25    Total Output 25     Net +1015          Urine Occurrence 2 x       Physical Exam: General: NAD.  Supine in bed Resp: No increased wob Cardio: regular rate and rhythm ABD soft Neurologically intact MSK RLE: Neurovascularly intact Sensation intact distally Intact pulses distally Dorsiflexion/Plantar flexion intact Incision: dressing C/D/I   11/24 III, PA-C 07/09/2017, 7:47 AM

## 2017-07-09 NOTE — Progress Notes (Signed)
Discharge instructions reviewed with pt and her family.  Copy of instructions and scripts given to pt. Pt and family asking about a raised toilet seat, case manager Darl Pikes making arrangements for equipment and having delivered to pt's room, pt and family informed.

## 2017-07-09 NOTE — Evaluation (Signed)
Physical Therapy Evaluation and Discharge Patient Details Name: Linda Lowe MRN: [id number] DOB: Jun 28, 1951 Today's Date: 07/09/2017   History of Present Illness  66 y.o. female s/p R ORIF patella fracture. PMH incldues: Cardiomyopathy, ICD, HTN, DM2, foot surgery.  Clinical Impression  Patient evaluated by Physical Therapy with no further acute PT needs identified. All education has been completed and the patient has no further questions. PTA, pt independent with all mobility. Pt presents to PT with decreased strength and mobility in RLE due to pain and immobilization. Pt demonstrates normal static and dynamic balance throughout session. Session focused on gait and stair training. Ambulated 150 feet with min guarding and use of BUE support in walker cued for gait mechanics. Pt ascended/descended 1 stair x4 with RW and min guarding. See below for any follow-up Physical Therapy or equipment needs. PT is signing off. Recommending home health PT when medically cleared for d/c.      Follow Up Recommendations Home health PT    Equipment Recommendations  Rolling walker with 5" wheels    Recommendations for Other Services       Precautions / Restrictions Precautions Precautions: Knee Precaution Comments: R knee in extension Required Braces or Orthoses: Knee Immobilizer - Right Knee Immobilizer - Right: On when out of bed or walking Restrictions Weight Bearing Restrictions: Yes RLE Weight Bearing: Weight bearing as tolerated Other Position/Activity Restrictions: Extension when walking      Mobility  Bed Mobility Overal bed mobility: Modified Independent             General bed mobility comments: delayed time due to pain and immobilization   Transfers Overall transfer level: Needs assistance Equipment used: Rolling walker (2 wheeled) Transfers: Sit to/from Stand Sit to Stand: Supervision         General transfer comment: Cues for safe hand placement and techinque with  walker during sit<>stand    Ambulation/Gait Ambulation/Gait assistance: Min guard Ambulation Distance (Feet): 150 Feet Assistive device: Rolling walker (2 wheeled) Gait Pattern/deviations: Step-to pattern;Decreased step length - left;Decreased stance time - right;Antalgic Gait velocity: decreased   General Gait Details: Cued patient for safe use of RW during gait, step length and knee precautions.   Stairs Stairs: Yes Stairs assistance: Min guard Stair Management: No rails;With walker Number of Stairs: 4 (1 platform x4) General stair comments: Cues for safe use of RW with stair to enter home, proper sequencing. Pt performs step-to pattern with BUE support in RW.  Wheelchair Mobility    Modified Rankin (Stroke Patients Only)       Balance Overall balance assessment: Modified Independent                                           Pertinent Vitals/Pain Pain Assessment: 0-10 Pain Score: 9  Pain Location: R Knee Pain Intervention(s): Limited activity within patient's tolerance;Monitored during session;Repositioned    Home Living Family/patient expects to be discharged to:: Private residence Living Arrangements: Spouse/significant other Available Help at Discharge: Family (Husband works till [employer]) Type of Home: House Home Access: Stairs to enter Entrance Stairs-Rails: None Technical brewer of Steps: 1 Home Layout: One level Home Equipment: Shower seat;Grab bars - toilet      Prior Function Level of Independence: Independent               Hand Dominance        Extremity/Trunk Assessment  Lower Extremity Assessment Lower Extremity Assessment: RLE deficits/detail (LLE=WNL strength and sensation ) RLE: Unable to fully assess due to immobilization RLE Sensation:  (WNL)       Communication   Communication: No difficulties  Cognition Arousal/Alertness: Awake/alert Behavior During Therapy: WFL for tasks  assessed/performed Overall Cognitive Status: Within Functional Limits for tasks assessed                                        General Comments      Exercises     Assessment/Plan    PT Assessment All further PT needs can be met in the next venue of care  PT Problem List Decreased strength;Decreased range of motion;Decreased activity tolerance;Decreased mobility;Decreased knowledge of use of DME;Pain       PT Treatment Interventions      PT Goals (Current goals can be found in the Care Plan section)  Acute Rehab PT Goals Patient Stated Goal: d/c to home with home health PT PT Goal Formulation: With patient Time For Goal Achievement: 07/12/17 Potential to Achieve Goals: Good    Frequency     Barriers to discharge        Co-evaluation               AM-PAC PT "6 Clicks" Daily Activity  Outcome Measure Difficulty turning over in bed (including adjusting bedclothes, sheets and blankets)?: A Little Difficulty moving from lying on back to sitting on the side of the bed? : A Little Difficulty sitting down on and standing up from a chair with arms (e.g., wheelchair, bedside commode, etc,.)?: A Little Help needed moving to and from a bed to chair (including a wheelchair)?: A Little Help needed walking in hospital room?: A Little Help needed climbing 3-5 steps with a railing? : A Little 6 Click Score: 18    End of Session Equipment Utilized During Treatment: Gait belt Activity Tolerance: Patient tolerated treatment well Patient left: in chair;with call bell/phone within reach   PT Visit Diagnosis: Muscle weakness (generalized) (M62.81);Pain;Difficulty in walking, not elsewhere classified (R26.2) Pain - Right/Left: Right Pain - part of body: Knee    Time: 2505-3976 PT Time Calculation (min) (ACUTE ONLY): 34 min   Charges:   PT Evaluation $PT Eval Low Complexity: 1 Low PT Treatments $Gait Training: 8-22 mins   PT G Codes:   PT G-Codes **NOT FOR  INPATIENT CLASS** Functional Assessment Tool Used: AM-PAC 6 Clicks Basic Mobility;Clinical judgement Functional Limitation: Mobility: Walking and moving around Mobility: Walking and Moving Around Current Status (B3419): At least 40 percent but less than 60 percent impaired, limited or restricted Mobility: Walking and Moving Around Goal Status [phone]): At least 40 percent but less than 60 percent impaired, limited or restricted Mobility: Walking and Moving Around Discharge Status [phone]): At least 40 percent but less than 60 percent impaired, limited or restricted    Reinaldo Berber, PT, DPT Acute Rehab Services Pager: [phone]    Reinaldo Berber 07/09/2017, 11:05 AM

## 2017-07-09 NOTE — Discharge Summary (Signed)
Discharge Summary  Patient ID: Linda Lowe MRN: [id number] DOB/AGE: [birth year] 66 y.o.  Admit date: 07/08/2017 Discharge date: 07/09/2017  Admission Diagnoses:  Right patella fracture  Discharge Diagnoses:  Principal Problem:   Right patella fracture Active Problems:   Essential hypertension   Hyperlipidemia   Nonischemic cardiomyopathy (HCC)   Chronic systolic heart failure (HCC)   Uncontrolled type 2 diabetes mellitus with hyperglycemia, with long-term current use of insulin (HCC)   Past Medical History:  Diagnosis Date  . AICD (automatic cardioverter/defibrillator) present    St Jude   . Arthritis   . Cardiomyopathy (HCC)    a. 05/2012 Echo: EF 30-35%, Gr 1 DD, mild LVH, mild MR, pericardial effusion  . Diabetes mellitus    a. Dx > 5 y ago  . Dyspnea   . Dysrhythmia   . Hypercholesteremia   . Hypertension   . Pericardial effusion    a. 05/2012 Echo: circumferential pericardial effusion, mostly 48mm, largest pocket posterior - 57mm, mild RA indentation, no RV collapse, no tamponade.    Surgeries: Procedure(s): OPEN REDUCTION INTERNAL (ORIF) FIXATION PATELLA on 07/08/2017   Consultants (if any):   Discharged Condition: Improved  Hospital Course: Linda Lowe is an 66 y.o. female who was admitted 07/08/2017 with a diagnosis of Right patella fracture and went to the operating room on 07/08/2017 and underwent the above named procedures.    She was given perioperative antibiotics:  Anti-infectives    Start     Dose/Rate Route Frequency Ordered Stop   07/08/17 1800  ceFAZolin (ANCEF) IVPB 1 g/50 mL premix     1 g 100 mL/hr over 30 Minutes Intravenous Every 6 hours 07/08/17 1725 07/09/17 0553   07/08/17 1000  ceFAZolin (ANCEF) IVPB 2g/100 mL premix     2 g 200 mL/hr over 30 Minutes Intravenous To [facility] 07/07/17 1253 07/08/17 1106    .  She was given sequential compression devices, early ambulation, and Lovenox for DVT prophylaxis.  She benefited  maximally from the hospital stay and there were no complications.    Recent vital signs:  Vitals:   07/09/17 0101 07/09/17 0546  BP: 130/63 (!) 144/62  Pulse: 63 75  Resp: 15 17  Temp: 98.9 F (37.2 C) 98.5 F (36.9 C)  SpO2: 93% 95%    Recent laboratory studies:  Lab Results  Component Value Date   HGB 13.8 07/04/2017   HGB 12.3 03/26/2017   HGB 12.2 03/23/2017   Lab Results  Component Value Date   WBC 7.8 07/04/2017   PLT 227 07/04/2017   Lab Results  Component Value Date   INR 1.05 05/26/2012   Lab Results  Component Value Date   NA 135 07/04/2017   K 4.3 07/04/2017   CL 103 07/04/2017   CO2 23 07/04/2017   BUN 21 (H) 07/04/2017   CREATININE 0.64 07/04/2017   GLUCOSE 140 (H) 07/04/2017    Discharge Medications:   Allergies as of 07/09/2017   No Known Allergies     Medication List    STOP taking these medications   HYDROcodone-acetaminophen 5-325 MG tablet Commonly known as:  NORCO/VICODIN   ibuprofen 200 MG tablet Commonly known as:  ADVIL,MOTRIN     TAKE these medications   amLODipine 5 MG tablet Commonly known as:  NORVASC Take 5 mg by mouth once daily   aspirin EC 325 MG tablet Take 1 tablet (325 mg total) by mouth daily. For 30 days post op for DVT Prophylaxis  atorvastatin 40 MG tablet Commonly known as:  LIPITOR TAKE 1 TABLET(40 MG) BY MOUTH DAILY   baclofen 10 MG tablet Commonly known as:  LIORESAL Take 1 tablet (10 mg total) by mouth 3 (three) times daily as needed for muscle spasms.   Canagliflozin-Metformin HCl [phone] MG Tabs Commonly known as:  INVOKAMET Take 1 tablet by mouth 2 (two) times daily.   carvedilol 25 MG tablet Commonly known as:  COREG Take 25 mg by mouth 2 (two) times daily.   docusate sodium 100 MG capsule Commonly known as:  COLACE Take 1 capsule (100 mg total) by mouth 2 (two) times daily. To prevent constipation while taking pain medication.   EPIPEN 0.3 mg/0.3 mL Devi Generic drug:   EPINEPHrine Inject 0.3 mg into the muscle as needed (for allergic reaction). For allergic reactions   furosemide 20 MG tablet Commonly known as:  LASIX Take 20 mg by mouth 2 (two) times daily.   insulin aspart 100 UNIT/ML FlexPen Commonly known as:  NOVOLOG FLEXPEN ADMINISTER 16 UNITS UNDER THE SKIN TWICE DAILY BEFORE LUNCH AND SUPPER   Insulin Pen Needle 31G X 5 MM Misc Commonly known as:  B-D UF III MINI PEN NEEDLES USE 5 PEN NEEDLES PER DAY AS INSTRUCTED   B-D UF III MINI PEN NEEDLES 31G X 5 MM Misc Generic drug:  Insulin Pen Needle USE 5 NEEDLES EVERY DAY AS DIRECTED   irbesartan 300 MG tablet Commonly known as:  AVAPRO Take 300 mg by mouth daily.   naproxen 500 MG tablet Commonly known as:  NAPROSYN Take 500 mg by mouth 2 (two) times daily as needed for mild pain.   omeprazole 20 MG capsule Commonly known as:  PRILOSEC Take 1 capsule (20 mg total) by mouth daily. 30 days for gastroprotection while taking Aspirin.   ondansetron 4 MG tablet Commonly known as:  ZOFRAN Take 1 tablet (4 mg total) by mouth every 8 (eight) hours as needed for nausea or vomiting.   ONETOUCH DELICA LANCETS FINE Misc USE TO TEST BLOOD SUGAR 3 TIMES DAILY   ONETOUCH VERIO test strip Generic drug:  glucose blood USE TO TEST BLOOD SUGAR 3 TIMES DAILY   oxybutynin 5 MG 24 hr tablet Commonly known as:  DITROPAN-XL Take 5 mg by mouth daily.   oxyCODONE-acetaminophen 5-325 MG tablet Commonly known as:  ROXICET Take 1-2 tablets by mouth every 4 (four) hours as needed for severe pain.   potassium chloride SA 20 MEQ tablet Commonly known as:  K-DUR,KLOR-CON TAKE 1 TABLET(20 MEQ) BY MOUTH DAILY   TOUJEO SOLOSTAR 300 UNIT/ML Sopn Generic drug:  Insulin Glargine INJECT 40 UNITS INTO THE SKIN DAILY What changed:  how much to take   VICTOZA 18 MG/3ML Sopn Generic drug:  liraglutide INJECT 1.8 MG UNDER THE SKIN DAILY       Diagnostic Studies: Dg Knee Complete 4 Views Right  Result Date:  07/01/2017 CLINICAL DATA:  Right knee pain after trip and fall injury. Swelling. EXAM: RIGHT KNEE - COMPLETE 4+ VIEW COMPARISON:  04/09/2011 FINDINGS: Comminuted fractures of the mid and inferior body of the patella with displacement and distraction of the inferior fracture fragments. There is associated right knee effusion and prepatellar and infrapatellar soft tissue swelling/ hematoma. The distal femur, proximal tibia, and proximal fibula appear intact. IMPRESSION: Comminuted and distracted fractures of the body and inferior patella with associated right knee effusion and soft tissue hematomas. Electronically Signed   By: Burman Nieves M.D.   On: 07/01/2017 01:06  Dg Knee Right Port  Result Date: 07/08/2017 CLINICAL DATA:  ORIF right patellar fracture. EXAM: PORTABLE RIGHT KNEE - 1-2 VIEW COMPARISON:  07/01/2017, 04/09/2011. FINDINGS: ORIF of the transverse patellar fracture with compression screws. Anatomic aligned. Enthesopathic spur at the insertion of the quadriceps tendon on the superior patella and at the insertion of the patellar tendon on the tibial tubercle as noted previously. IMPRESSION: Anatomic alignment post ORIF of right patellar fracture. Electronically Signed   By: Hulan Saas M.D.   On: 07/08/2017 13:18    Disposition: 01-Home or Self Care    Follow-up Information    Sheral Apley, MD Follow up.   Specialty:  Orthopedic Surgery Contact information: [address] [place] [address] [phone]            Signed: Albina Billet III PA-C 07/09/2017, 7:56 AM

## 2017-07-09 NOTE — Evaluation (Signed)
Occupational Therapy Evaluation Patient Details Name: Linda Lowe MRN: [id number] DOB: [birth year] Today's Date: 07/09/2017    History of Present Illness 66 y.o. female s/p R ORIF patella fracture. PMH incldues: Cardiomyopathy, ICD, HTN, DM2, foot surgery.   Clinical Impression   Pt reports she was mod I with ADL since recent fall. Currently pt requires supervision for functional mobility and min assist for LB ADL. Pt planning to d/c home with intermittent supervision from family. Pt would benefit from continued skilled OT to address established goals.    Follow Up Recommendations  No OT follow up;Supervision - Intermittent    Equipment Recommendations  None recommended by OT    Recommendations for Other Services       Precautions / Restrictions Precautions Precautions:  Precaution Comments:  Required Braces or Orthoses: Knee Immobilizer - Right Knee Immobilizer - Right: On when out of bed or walking Restrictions Weight Bearing Restrictions: Yes RLE Weight Bearing: Weight bearing as tolerated      Mobility Bed Mobility              General bed mobility comments: Pt OOB in chair upon arrival  Transfers Overall transfer level: Needs assistance Equipment used: Rolling walker (2 wheeled) Transfers: Sit to/from Stand Sit to Stand: Supervision         General transfer comment: for safety. Good hand placement and technique    Balance Overall balance assessment: History of Falls;Needs assistance Sitting-balance support: Feet supported;No upper extremity supported Sitting balance-Leahy Scale: Good     Standing balance support: No upper extremity supported;During functional activity Standing balance-Leahy Scale: Fair Standing balance comment: Able to stand at sink and wash hands without UE support                           ADL either performed or assessed with clinical judgement   ADL Overall ADL's : Needs assistance/impaired Eating/Feeding: Set  up;Sitting   Grooming: Supervision/safety;Standing;Wash/dry hands   Upper Body Bathing: Set up;Sitting   Lower Body Bathing: Min guard;Sit to/from stand   Upper Body Dressing : Set up;Sitting   Lower Body Dressing: Minimal assistance;Sit to/from stand   Toilet Transfer: Supervision/safety;Ambulation;BSC;RW   Toileting- Clothing Manipulation and Hygiene: Supervision/safety;Sit to/from Nurse, children's Details (indicate cue type and reason): Pt reports she uses 3 in 1 as a seat in her shower for safety with bathing Functional mobility during ADLs: Supervision/safety;Rolling walker       Vision         Perception     Praxis      Pertinent Vitals/Pain Pain Assessment: 0-10 Pain Score: 6  Pain Location: R knee Pain Descriptors / Indicators: Aching;Discomfort;Sore Pain Intervention(s): Monitored during session;Ice applied     Hand Dominance     Extremity/Trunk Assessment Upper Extremity Assessment Upper Extremity Assessment: Overall WFL for tasks assessed   Lower Extremity Assessment Lower Extremity Assessment: Defer to PT evaluation    Cervical / Trunk Assessment Cervical / Trunk Assessment: Normal   Communication Communication Communication: No difficulties   Cognition Arousal/Alertness: Awake/alert Behavior During Therapy: WFL for tasks assessed/performed Overall Cognitive Status: Within Functional Limits for tasks assessed                                     General Comments       Exercises     Shoulder Instructions  Home Living Family/patient expects to be discharged to:: Private residence Living Arrangements: Spouse/significant other Available Help at Discharge: Family;Available PRN/intermittently Type of Home: House Home Access: Stairs to enter [employer] of Steps: 1 Entrance Stairs-Rails: None Home Layout: One level     Bathroom Shower/Tub: IT trainer:  Standard Bathroom Accessibility: Yes   Home Equipment: Bedside commode;Walker - 2 wheels          Prior Functioning/Environment Level of Independence: Independent        Comments: Using 3 in 1 in tub as a seat since fall        OT Problem List: Decreased strength;Decreased range of motion;Decreased activity tolerance;Impaired balance (sitting and/or standing);Decreased knowledge of use of DME or AE;Decreased knowledge of precautions;Pain      OT Treatment/Interventions: Self-care/ADL training;DME and/or AE instruction;Therapeutic activities;Patient/family education;Balance training    OT Goals(Current goals can be found in the care plan section) Acute Rehab OT Goals Patient Stated Goal: decrease pain OT Goal Formulation: With patient Time For Goal Achievement: 07/23/17 Potential to Achieve Goals: Good ADL Goals Pt Will Perform Lower Body Bathing: with supervision;sit to/from stand Pt Will Perform Lower Body Dressing: with supervision;sit to/from stand Pt Will Perform Tub/Shower Transfer: Tub transfer;with supervision;ambulating;rolling walker;3 in 1  OT Frequency: Min 2X/week   Barriers to D/C: Decreased caregiver support  husband works during the day       Co-evaluation              AM-PAC PT "6 Clicks" Daily Activity     Outcome Measure Help from another person eating meals?: None Help from another person taking care of personal grooming?: A Little Help from another person toileting, which includes using toliet, bedpan, or urinal?: A Little Help from another person bathing (including washing, rinsing, drying)?: A Little Help from another person to put on and taking off regular upper body clothing?: None Help from another person to put on and taking off regular lower body clothing?: A Little 6 Click Score: 20   End of Session Equipment Utilized During Treatment: Rolling walker;Right knee immobilizer  Activity Tolerance: Patient tolerated treatment  well Patient left: in chair;with call bell/phone within reach  OT Visit Diagnosis: Unsteadiness on feet (R26.81);Other abnormalities of gait and mobility (R26.89);Pain Pain - Right/Left: Right Pain - part of body: Knee                Time: 5456-2563 OT Time Calculation (min): 13 min Charges:  OT General Charges $OT Visit: 1 Visit OT Evaluation $OT Eval Moderate Complexity: 1 Mod G-Codes: OT G-codes **NOT FOR INPATIENT CLASS** Functional Assessment Tool Used: Clinical judgement Functional Limitation: Self care Self Care Current Status (S9373): At least 1 percent but less than 20 percent impaired, limited or restricted Self Care Goal Status (S2876): At least 1 percent but less than 20 percent impaired, limited or restricted   Fredric Mare A. Brett Albino, M.S., OTR/L Pager: [phone]  Gaye Alken 07/09/2017, 11:20 AM

## 2017-07-09 NOTE — Discharge Instructions (Signed)
You must keep knee straight at all times.  Keep knee immobilizer on at all times.  Elevate leg above your heart as much as possible to reduce pain and swelling.  Diet: As you were doing prior to hospitalization   Dressing:  Keep dressings on and dry until follow up.  Activity:  Increase activity slowly as tolerated, but follow the weight bearing instructions below.  The rules on driving is that you can not be taking narcotics while you drive, and you must feel in control of the vehicle.    Weight Bearing:   As tolerated in knee immobilizer at all times.  To prevent constipation: you may use a stool softener such as -  Colace (over the counter) 100 mg by mouth twice a day  Drink plenty of fluids (prune juice may be helpful) and high fiber foods Miralax (over the counter) for constipation as needed.    Itching:  If you experience itching with your medications, try taking only a single pain pill, or even half a pain pill at a time.  You can also use benadryl over the counter for itching or also to help with sleep.   Precautions:  If you experience chest pain or shortness of breath - call 911 immediately for transfer to the hospital emergency department!!  If you develop a fever greater that 101 F, purulent drainage from wound, increased redness or drainage from wound, or calf pain -- Call the office at [phone]                                                 Follow- Up Appointment:  Please call for an appointment to be seen in 1-2 weeks [place] - [phone]

## 2017-07-15 ENCOUNTER — Other Ambulatory Visit: Payer: Federal, State, Local not specified - PPO

## 2017-07-18 ENCOUNTER — Ambulatory Visit: Payer: Federal, State, Local not specified - PPO | Admitting: Endocrinology

## 2017-07-24 ENCOUNTER — Other Ambulatory Visit: Payer: Self-pay | Admitting: Endocrinology

## 2017-07-27 ENCOUNTER — Other Ambulatory Visit: Payer: Self-pay | Admitting: Endocrinology

## 2017-08-06 ENCOUNTER — Other Ambulatory Visit: Payer: Self-pay | Admitting: Endocrinology

## 2017-08-12 DIAGNOSIS — I428 Other cardiomyopathies: Secondary | ICD-10-CM | POA: Diagnosis not present

## 2017-08-12 DIAGNOSIS — Z4502 Encounter for adjustment and management of automatic implantable cardiac defibrillator: Secondary | ICD-10-CM | POA: Diagnosis not present

## 2017-08-12 DIAGNOSIS — Z95810 Presence of automatic (implantable) cardiac defibrillator: Secondary | ICD-10-CM | POA: Diagnosis not present

## 2017-08-28 ENCOUNTER — Other Ambulatory Visit: Payer: Self-pay | Admitting: Endocrinology

## 2017-09-03 ENCOUNTER — Other Ambulatory Visit: Payer: Self-pay | Admitting: Endocrinology

## 2017-09-07 ENCOUNTER — Other Ambulatory Visit: Payer: Self-pay | Admitting: Endocrinology

## 2017-09-12 ENCOUNTER — Other Ambulatory Visit: Payer: Self-pay | Admitting: Endocrinology

## 2017-09-17 ENCOUNTER — Other Ambulatory Visit: Payer: Self-pay | Admitting: Endocrinology

## 2017-09-17 DIAGNOSIS — M25661 Stiffness of right knee, not elsewhere classified: Secondary | ICD-10-CM | POA: Diagnosis not present

## 2017-09-18 ENCOUNTER — Telehealth: Payer: Self-pay | Admitting: Endocrinology

## 2017-10-03 ENCOUNTER — Other Ambulatory Visit: Payer: Self-pay | Admitting: Endocrinology

## 2017-10-07 ENCOUNTER — Other Ambulatory Visit: Payer: Self-pay | Admitting: Endocrinology

## 2017-10-16 ENCOUNTER — Other Ambulatory Visit: Payer: Self-pay

## 2017-10-16 MED ORDER — GLUCOSE BLOOD VI STRP: ORAL_STRIP | 12 refills | Status: DC

## 2017-10-26 ENCOUNTER — Other Ambulatory Visit: Payer: Self-pay | Admitting: Endocrinology

## 2017-10-28 ENCOUNTER — Telehealth: Payer: Self-pay

## 2017-10-28 NOTE — Telephone Encounter (Signed)
Received PA for pt [employer]. Can not proceed until pt makes an appt because she has not been seen in over a year, per Lucianne Muss

## 2017-11-03 ENCOUNTER — Other Ambulatory Visit: Payer: Self-pay | Admitting: Endocrinology

## 2017-11-04 ENCOUNTER — Other Ambulatory Visit: Payer: Self-pay

## 2017-11-04 MED ORDER — GLUCOSE BLOOD VI STRP: ORAL_STRIP | 2 refills | Status: DC

## 2017-11-09 ENCOUNTER — Other Ambulatory Visit: Payer: Self-pay | Admitting: Endocrinology

## 2017-12-02 ENCOUNTER — Other Ambulatory Visit: Payer: Self-pay | Admitting: Endocrinology

## 2018-01-10 ENCOUNTER — Other Ambulatory Visit: Payer: Self-pay | Admitting: Endocrinology

## 2018-01-25 ENCOUNTER — Other Ambulatory Visit: Payer: Self-pay | Admitting: Endocrinology

## 2018-02-27 ENCOUNTER — Other Ambulatory Visit: Payer: Self-pay | Admitting: Endocrinology

## 2018-04-29 DIAGNOSIS — I639 Cerebral infarction, unspecified: Secondary | ICD-10-CM

## 2018-04-29 HISTORY — DX: Cerebral infarction, unspecified: I63.9

## 2018-11-09 IMAGING — DX DG ABDOMEN ACUTE W/ 1V CHEST
3 series · 3 of 3 positions shown · non-contrast
Comparison: Chest radiograph and CT of the chest performed
09/16/2016

CLINICAL DATA: Acute onset of generalized weakness. Decreased
appetite, headache, dizziness, diaphoresis and nausea. Diarrhea.
Initial encounter.

EXAM:
DG ABDOMEN ACUTE W/ 1V CHEST

[chest pa]
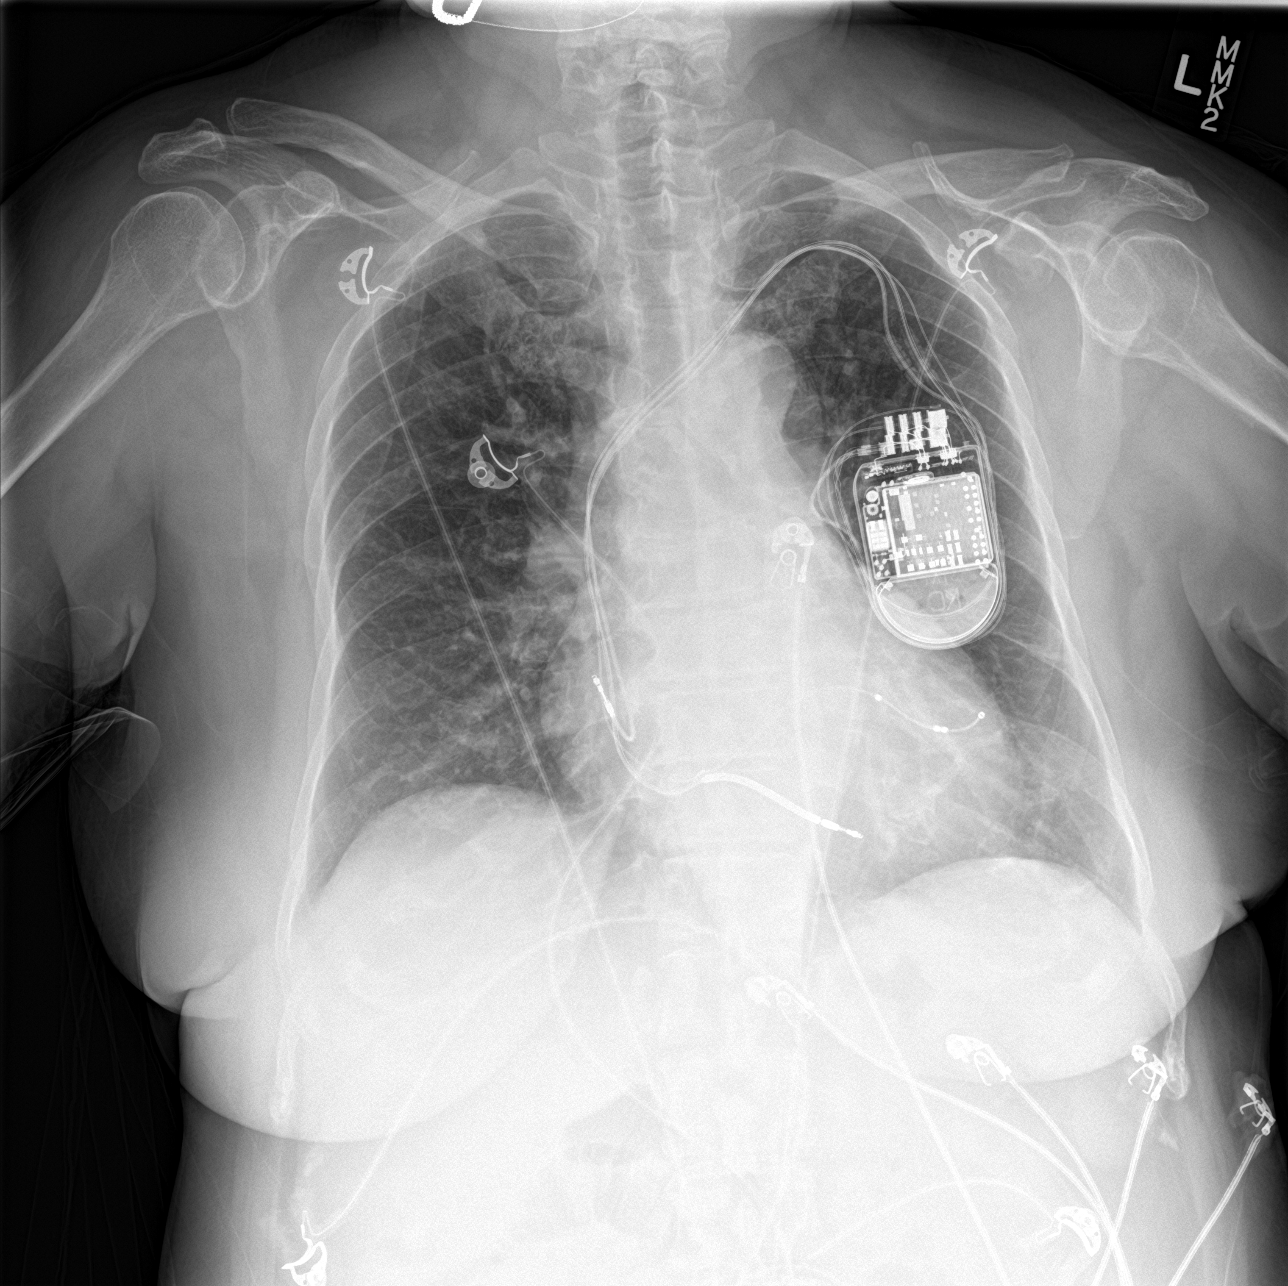

[abdomen erect]
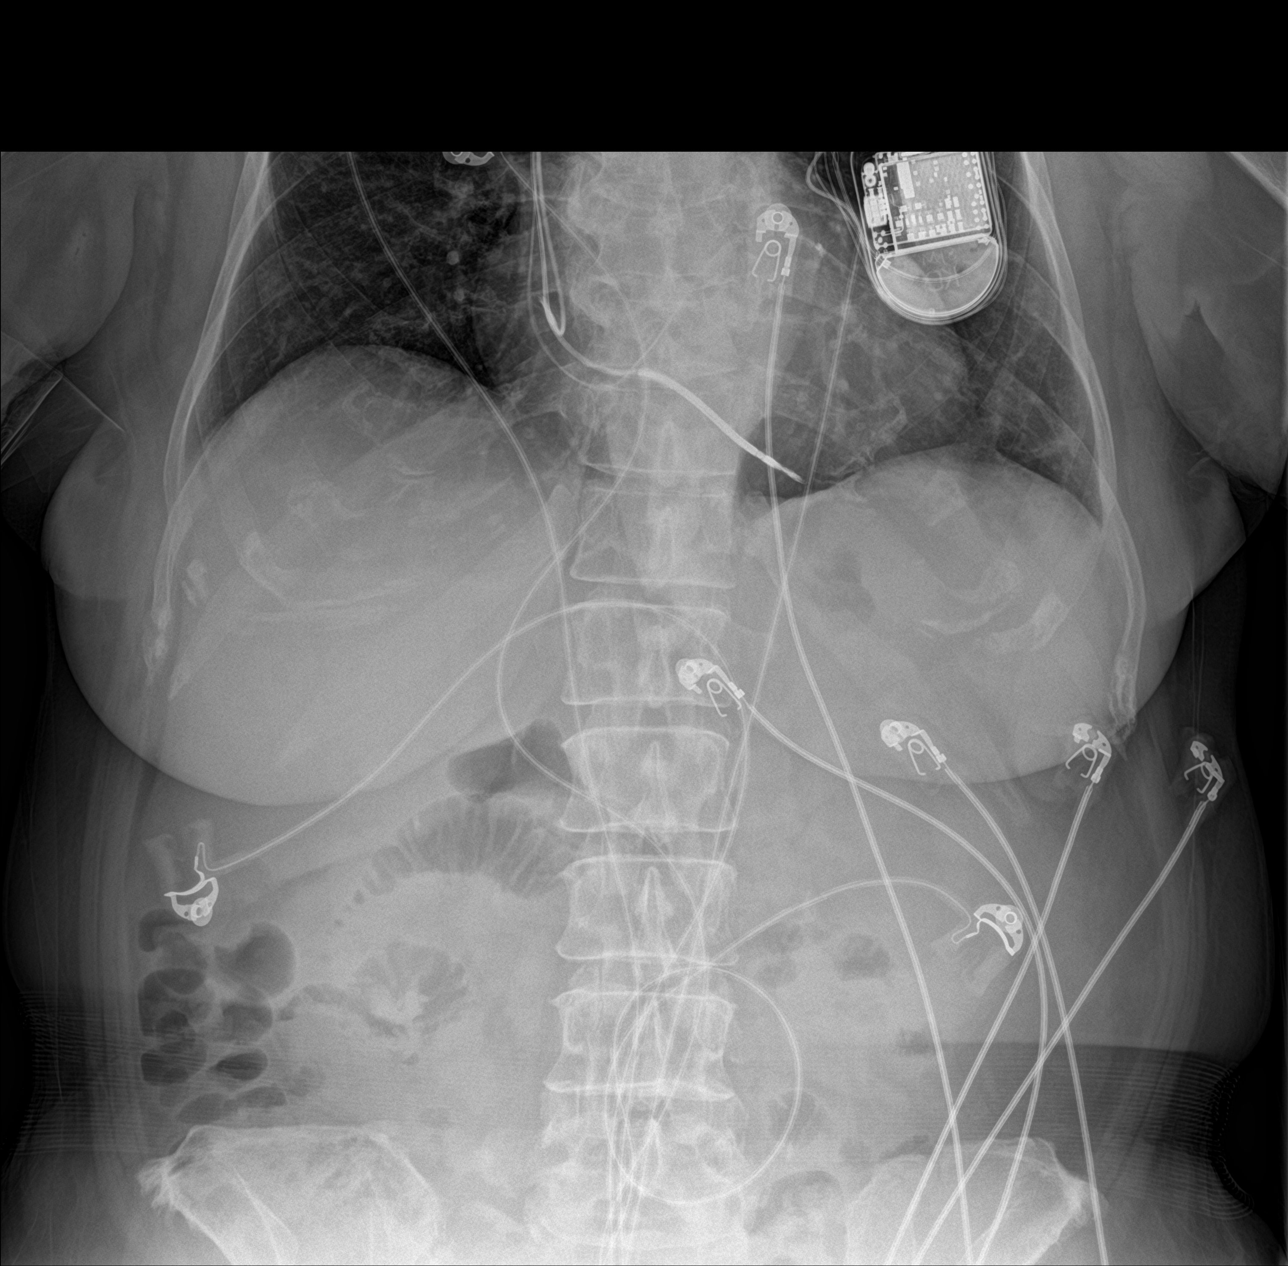

[abdomen supine]
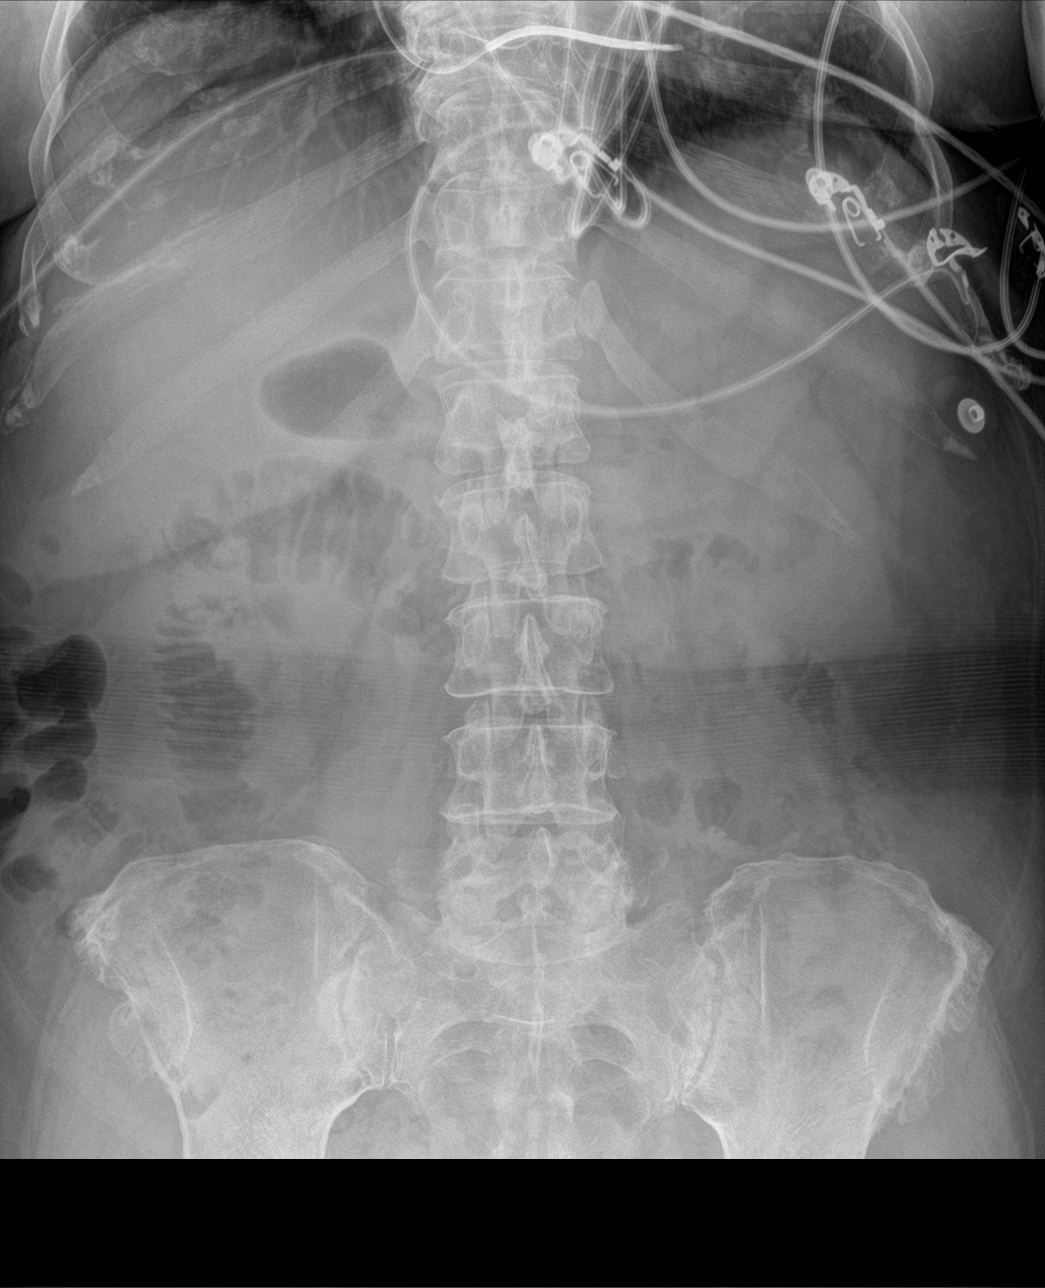

[3 of 3 positions shown; findings below may reference images not displayed]

FINDINGS: The lungs are well-aerated. Mild left basilar atelectasis is noted.
There is no evidence of pleural effusion or pneumothorax. The
cardiomediastinal silhouette is borderline enlarged. A
pacemaker/AICD is noted overlying the left chest wall, with leads
ending overlying the right atrium, right ventricle and coronary
sinus.

The visualized bowel gas pattern is unremarkable. Scattered stool
and air are seen within the colon; there is no evidence of small
bowel dilatation to suggest obstruction. No free intra-abdominal air
is identified on the provided upright view.

No acute osseous abnormalities are seen; the sacroiliac joints are
unremarkable in appearance.
IMPRESSION: 1. Unremarkable bowel gas pattern; no free intra-abdominal air seen.
Small amount of stool noted in the colon.
2. Mild left basilar atelectasis noted.  Borderline cardiomegaly.

## 2019-04-15 ENCOUNTER — Other Ambulatory Visit: Payer: Self-pay

## 2019-08-11 ENCOUNTER — Other Ambulatory Visit: Payer: Self-pay

## 2019-09-01 ENCOUNTER — Emergency Department (HOSPITAL_COMMUNITY)
Admission: EM | Admit: 2019-09-01 | Discharge: 2019-09-02 | Disposition: A | Discharge status: Home / Self Care | Payer: Medicare Other | Attending: Emergency Medicine | Admitting: Emergency Medicine

## 2019-09-01 ENCOUNTER — Other Ambulatory Visit: Payer: Self-pay

## 2019-09-01 DIAGNOSIS — R2241 Localized swelling, mass and lump, right lower limb: Secondary | ICD-10-CM | POA: Insufficient documentation

## 2019-09-01 DIAGNOSIS — Z87891 Personal history of nicotine dependence: Secondary | ICD-10-CM | POA: Insufficient documentation

## 2019-09-01 DIAGNOSIS — Z95810 Presence of automatic (implantable) cardiac defibrillator: Secondary | ICD-10-CM | POA: Diagnosis not present

## 2019-09-01 DIAGNOSIS — R2231 Localized swelling, mass and lump, right upper limb: Secondary | ICD-10-CM | POA: Insufficient documentation

## 2019-09-01 DIAGNOSIS — I10 Essential (primary) hypertension: Secondary | ICD-10-CM | POA: Insufficient documentation

## 2019-09-01 DIAGNOSIS — Z7982 Long term (current) use of aspirin: Secondary | ICD-10-CM | POA: Diagnosis not present

## 2019-09-01 DIAGNOSIS — M25511 Pain in right shoulder: Secondary | ICD-10-CM | POA: Diagnosis not present

## 2019-09-01 DIAGNOSIS — E119 Type 2 diabetes mellitus without complications: Secondary | ICD-10-CM | POA: Diagnosis not present

## 2019-09-01 DIAGNOSIS — R0789 Other chest pain: Secondary | ICD-10-CM | POA: Diagnosis not present

## 2019-09-01 DIAGNOSIS — Z794 Long term (current) use of insulin: Secondary | ICD-10-CM | POA: Diagnosis not present

## 2019-09-01 DIAGNOSIS — Z79899 Other long term (current) drug therapy: Secondary | ICD-10-CM | POA: Insufficient documentation

## 2019-09-01 DIAGNOSIS — R600 Localized edema: Secondary | ICD-10-CM

## 2019-09-01 LAB — CBC WITH DIFFERENTIAL/PLATELET
Abs Immature Granulocytes: 0.02 10*3/uL (ref 0.00–0.07)
Basophils Absolute: 0.1 10*3/uL (ref 0.0–0.1)
Basophils Relative: 1 %
Eosinophils Absolute: 0.2 10*3/uL (ref 0.0–0.5)
Eosinophils Relative: 2 %
HCT: 43.1 % (ref 36.0–46.0)
Hemoglobin: 14.6 g/dL (ref 12.0–15.0)
Immature Granulocytes: 0 %
Lymphocytes Relative: 28 %
Lymphs Abs: 2 10*3/uL (ref 0.7–4.0)
MCH: 29.3 pg (ref 26.0–34.0)
MCHC: 33.9 g/dL (ref 30.0–36.0)
MCV: 86.4 fL (ref 80.0–100.0)
Monocytes Absolute: 0.5 10*3/uL (ref 0.1–1.0)
Monocytes Relative: 7 %
Neutro Abs: 4.4 10*3/uL (ref 1.7–7.7)
Neutrophils Relative %: 62 %
Platelets: 301 10*3/uL (ref 150–400)
RBC: 4.99 MIL/uL (ref 3.87–5.11)
RDW: 13.3 % (ref 11.5–15.5)
WBC: 7.1 10*3/uL (ref 4.0–10.5)
nRBC: 0 % (ref 0.0–0.2)

## 2019-09-01 LAB — URINALYSIS, ROUTINE W REFLEX MICROSCOPIC
Bacteria, UA: NONE SEEN
Bilirubin Urine: NEGATIVE
Glucose, UA: >=500 mg/dL — AB
Hgb urine dipstick: NEGATIVE
Ketones, ur: NEGATIVE mg/dL
Nitrite: NEGATIVE
Protein, ur: 100 mg/dL — AB
Specific Gravity, Urine: 1.027 (ref 1.005–1.030)
pH: 7 (ref 5.0–8.0)

## 2019-09-01 NOTE — ED Triage Notes (Addendum)
Pt BIB [facility] for eval of R sided pain to R arm and leg. EMS reports largely just edema, but endorses pain worse w/ movement and exertion. Denies SOB, Denies CP. Significant edema to blt feet, worse on R. EMS reports hx of CVA, however stroke screen negative. Pt is demand paced. Pt reports that she has felt pain and swelling on the R arm and leg since her return from the [place] 2 weeks ago. States NO NEW WEAKNESS, just worsening edema and pain. Pt reports she has taken six doses of tylenol today to help with pain

## 2019-09-02 ENCOUNTER — Emergency Department (HOSPITAL_BASED_OUTPATIENT_CLINIC_OR_DEPARTMENT_OTHER): Payer: Medicare Other

## 2019-09-02 ENCOUNTER — Encounter (HOSPITAL_COMMUNITY): Payer: Self-pay

## 2019-09-02 ENCOUNTER — Emergency Department (HOSPITAL_COMMUNITY): Payer: Medicare Other

## 2019-09-02 DIAGNOSIS — R609 Edema, unspecified: Secondary | ICD-10-CM | POA: Diagnosis not present

## 2019-09-02 DIAGNOSIS — M25511 Pain in right shoulder: Secondary | ICD-10-CM | POA: Diagnosis not present

## 2019-09-02 DIAGNOSIS — M7989 Other specified soft tissue disorders: Secondary | ICD-10-CM

## 2019-09-02 LAB — CBG MONITORING, ED: Glucose-Capillary: 246 mg/dL — ABNORMAL HIGH (ref 70–99)

## 2019-09-02 LAB — COMPREHENSIVE METABOLIC PANEL
ALT: 21 U/L (ref 0–44)
AST: 18 U/L (ref 15–41)
Albumin: 3.4 g/dL — ABNORMAL LOW (ref 3.5–5.0)
Alkaline Phosphatase: 84 U/L (ref 38–126)
Anion gap: 8 (ref 5–15)
BUN: 11 mg/dL (ref 8–23)
CO2: 25 mmol/L (ref 22–32)
Calcium: 9.1 mg/dL (ref 8.9–10.3)
Chloride: 102 mmol/L (ref 98–111)
Creatinine, Ser: 0.58 mg/dL (ref 0.44–1.00)
GFR calc Af Amer: >60 mL/min (ref >60)
GFR calc non Af Amer: >60 mL/min (ref >60)
Glucose, Bld: 425 mg/dL — ABNORMAL HIGH (ref 70–99)
Potassium: 3.7 mmol/L (ref 3.5–5.1)
Sodium: 135 mmol/L (ref 135–145)
Total Bilirubin: 0.2 mg/dL — ABNORMAL LOW (ref 0.3–1.2)
Total Protein: 7.1 g/dL (ref 6.5–8.1)

## 2019-09-02 MED ORDER — HYDROCODONE-ACETAMINOPHEN 5-325 MG PO TABS
1.0000 | ORAL_TABLET | Freq: Once | ORAL | Status: AC
  Administered 2019-09-02: 1 via ORAL
  Filled 2019-09-02: qty 1

## 2019-09-02 MED ORDER — IOHEXOL 350 MG/ML SOLN
75.0000 mL | Freq: Once | INTRAVENOUS | Status: AC | PRN
  Administered 2019-09-02: 75 mL via INTRAVENOUS

## 2019-09-02 MED ORDER — MORPHINE SULFATE (PF) 2 MG/ML IV SOLN
2.0000 mg | Freq: Once | INTRAVENOUS | Status: AC
  Administered 2019-09-02: 2 mg via INTRAVENOUS
  Filled 2019-09-02: qty 1

## 2019-09-02 MED ORDER — ACETAMINOPHEN 500 MG PO TABS: 500.0000 mg | ORAL_TABLET | Freq: Four times a day (QID) | ORAL | 0 refills | Status: DC | PRN

## 2019-09-02 MED ORDER — FENTANYL CITRATE (PF) 100 MCG/2ML IJ SOLN
50.0000 ug | Freq: Once | INTRAMUSCULAR | Status: AC
  Administered 2019-09-02: 50 ug via INTRAVENOUS
  Filled 2019-09-02: qty 2

## 2019-09-02 MED ORDER — INSULIN ASPART 100 UNIT/ML ~~LOC~~ SOLN: 5.0000 [IU] | Freq: Once | SUBCUTANEOUS | Status: DC

## 2019-09-02 NOTE — ED Notes (Signed)
Patient transported to CT 

## 2019-09-02 NOTE — Discharge Instructions (Signed)
Your work-up today was reassuring.  There was no evidence of blood clots in the lungs or in your arm or leg.  However your ultrasound did show some lymph node swelling in the right groin and an area of abnormal tissue in the right arm.  I spoke with our oncologist who specializes in abnormal tissue and he recommends obtaining a CT scan of the right upper extremity with contrast as well as a CT abdomen pelvis with contrast.  Your primary care provider can order these studies to be done on an outpatient basis.    In the meantime, you can take 1 to 2 tablets of extra strength Tylenol every 6 hours as needed for pain.  You can also apply a heating pad or an ice pack, whichever feels best to areas of pain 3-4 times daily.  Please follow-up with your primary care provider as scheduled on Tuesday.  Return to the emergency department immediately if any concerning signs or symptoms develop such as shortness of breath, chest pain, worsening swelling or redness of the arm or leg, worsening weakness, high fevers, or loss of consciousness.

## 2019-09-02 NOTE — Progress Notes (Signed)
Right upper and lower extremity venous duplex completed. Refer to "CV Proc" under chart review to view preliminary results.  Preliminary results discussed with PA.  09/02/2019 8:43 AM Kelby Aline., MHA, RVT, RDCS, RDMS

## 2019-09-02 NOTE — ED Provider Notes (Signed)
[facility] EMERGENCY DEPARTMENT Provider Note   CSN: [id number] Arrival date & time: 09/01/19  2300     History Chief Complaint  Patient presents with   R Sided Pain    Linda Lowe is a 68 y.o. female.   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 She states that pain in her R posterior shoulder has been constant since returning from the [place] 2 weeks ago. Patient took Tylenol for symptoms without relief. Has also been experiencing increased swelling to her RUE and in her legs, R>L. Denies fever, chest pain, SOB, abdominal pain, new or worsening extremity numbness or weakness.       Past Medical History:  Diagnosis Date   AICD (automatic cardioverter/defibrillator) present    St Jude    Arthritis    Cardiomyopathy ([place])    a. 05/2012 Echo: EF 30-35%, Gr 1 DD, mild LVH, mild MR, pericardial effusion   Diabetes mellitus    a. Dx > 5 y ago   Dyspnea    Dysrhythmia    Hypercholesteremia    Hypertension    Pericardial effusion    a. 05/2012 Echo: circumferential pericardial effusion, mostly 35mm, largest pocket posterior - 52mm, mild RA indentation, no RV collapse, no tamponade.    Patient Active Problem List   Diagnosis Date Noted   Right patella fracture 07/08/2017   Hyponatremia 03/22/2017   SIRS (systemic inflammatory response syndrome) ([place]) 03/22/2017   Diarrhea 03/22/2017   Elevated troponin 03/22/2017   Uncontrolled type 2 diabetes mellitus with hyperglycemia, with long-term current use of insulin ([place]) 09/28/2016   Nonischemic cardiomyopathy ([place]) 65/78/4696   Chronic systolic heart failure ([place]) 08/26/2013   LBBB (left bundle branch block) 08/26/2013   Hyperlipidemia 06/23/2013   Essential hypertension 05/23/2012    Past Surgical History:  Procedure Laterality  Date   ABDOMINAL HYSTERECTOMY     BI-VENTRICULAR IMPLANTABLE CARDIOVERTER DEFIBRILLATOR N/A 12/01/2013   Procedure: BI-VENTRICULAR IMPLANTABLE CARDIOVERTER DEFIBRILLATOR  (CRT-D);  Surgeon: Deboraha Sprang, MD;  Location: [facility] CATH LAB;  Service: Cardiovascular;  Laterality: N/A;   FOOT SURGERY     LEFT AND RIGHT HEART CATHETERIZATION WITH CORONARY ANGIOGRAM  05/26/2012   Procedure: LEFT AND RIGHT HEART CATHETERIZATION WITH CORONARY ANGIOGRAM;  Surgeon: Sherren Mocha, MD;  Location: [facility] C[facility] LAB;  Service: Cardiovascular;;   ORIF PATELLA Right 07/08/2017   Procedure: OPEN REDUCTION INTERNAL (ORIF) FIXATION PATELLA;  Surgeon: Renette Butters, MD;  Location: [facility];  Service: Orthopedics;  Laterality: Right;     OB History   No obstetric history on file.     Family History  Problem Relation Age of Onset   Other Unknown        No history of CAD   Tuberculosis Father        died when pt was young.    Social History   Tobacco Use   Smoking status: Former Smoker   Smokeless tobacco: Never Used   Tobacco comment: Quit "long time ago."  Substance Use Topics   Alcohol use: No   Drug use: No    Home Medications Prior to Admission medications   Medication Sig Start Date End Date Taking? Authorizing Provider  amLODipine (NORVASC) 5 MG tablet Take 5 mg by mouth once daily 09/16/16   [provider]  aspirin EC 325  MG tablet Take 1 tablet (325 mg total) by mouth daily. For 30 days post op for DVT Prophylaxis 07/08/17   Albina BilletMartensen, Henry Calvin III, PA-C  atorvastatin (LIPITOR) 40 MG tablet TAKE 1 TABLET(40 MG) BY MOUTH DAILY 12/02/17   Reather LittlerKumar, Ajay, MD  B-D UF III MINI PEN NEEDLES 31G X 5 MM MISC USE 5 NEEDLES EVERY DAY AS DIRECTED 09/12/17   Reather LittlerKumar, Ajay, MD  baclofen (LIORESAL) 10 MG tablet Take 1 tablet (10 mg total) by mouth 3 (three) times daily as needed for muscle spasms. 07/08/17   Albina BilletMartensen, Henry Calvin III, PA-C  carvedilol (COREG) 25 MG tablet Take 25 mg by mouth 2  (two) times daily. 07/15/15   [provider]  docusate sodium (COLACE) 100 MG capsule Take 1 capsule (100 mg total) by mouth 2 (two) times daily. To prevent constipation while taking pain medication. 07/08/17   Martensen, Lucretia KernHenry Calvin III, PA-C  EPINEPHrine (EPIPEN) 0.3 mg/0.3 mL DEVI Inject 0.3 mg into the muscle as needed (for allergic reaction). For allergic reactions     [provider]  furosemide (LASIX) 20 MG tablet Take 20 mg by mouth 2 (two) times daily.     [provider]  glucose blood (CONTOUR NEXT TEST) test strip Use as instructed 10/16/17   Reather LittlerKumar, Ajay, MD  glucose blood (ONETOUCH VERIO) test strip USE TO TEST BLOOD SUGAR 3 TIMES DAILY 11/04/17   Reather LittlerKumar, Ajay, MD  insulin aspart (NOVOLOG FLEXPEN) 100 UNIT/ML FlexPen ADMINISTER 16 UNITS UNDER THE SKIN TWICE DAILY BEFORE LUNCH AND SUPPER 04/17/17   Reather LittlerKumar, Ajay, MD  Insulin Pen Needle (B-D UF III MINI PEN NEEDLES) 31G X 5 MM MISC USE 5 PEN NEEDLES PER DAY AS INSTRUCTED Patient not taking: Reported on 07/03/2017 10/14/16   Reather LittlerKumar, Ajay, MD  Insulin Pen Needle (B-D UF III MINI PEN NEEDLES) 31G X 5 MM MISC USE 5 NEEDLES EVERY DAY AS DIRECTED 09/12/17   Reather LittlerKumar, Ajay, MD  INVOKAMET [phone] MG TABS TAKE 1 TABLET BY MOUTH TWICE DAILY 10/03/17   Reather LittlerKumar, Ajay, MD  irbesartan (AVAPRO) 300 MG tablet Take 300 mg by mouth daily.    [provider]  liraglutide (VICTOZA) 18 MG/3ML SOPN ADMINISTER 1.8 MG UNDER THE SKIN DAILY 10/08/17   Reather LittlerKumar, Ajay, MD  naproxen (NAPROSYN) 500 MG tablet Take 500 mg by mouth 2 (two) times daily as needed for mild pain.     [provider]  NOVOLOG FLEXPEN 100 UNIT/ML FlexPen ADMINISTER 16 UNITS UNDER THE SKIN TWICE DAILY BEFORE LUNCH AND SUPPER 07/24/17   Reather LittlerKumar, Ajay, MD  omeprazole (PRILOSEC) 20 MG capsule Take 1 capsule (20 mg total) by mouth daily. 30 days for gastroprotection while taking Aspirin. 07/08/17   Martensen, Lucretia KernHenry Calvin III, PA-C  ondansetron (ZOFRAN) 4 MG tablet Take 1  tablet (4 mg total) by mouth every 8 (eight) hours as needed for nausea or vomiting. 07/08/17   Martensen, Lucretia KernHenry Calvin III, PA-C  ONETOUCH DELICA LANCETS 33G MISC USE TO TEST BLOOD SUGAR LEVELS THREE TIMES DAILY 09/17/17   Reather LittlerKumar, Ajay, MD  oxybutynin (DITROPAN-XL) 5 MG 24 hr tablet Take 5 mg by mouth daily.    [provider]  oxyCODONE-acetaminophen (ROXICET) 5-325 MG tablet Take 1-2 tablets by mouth every 4 (four) hours as needed for severe pain. 07/08/17   Albina BilletMartensen, Henry Calvin III, PA-C  potassium chloride SA (K-DUR,KLOR-CON) 20 MEQ tablet TAKE 1 TABLET(20 MEQ) BY MOUTH DAILY 12/01/16   Reather LittlerKumar, Ajay, MD  potassium chloride SA (K-DUR,KLOR-CON) 20  MEQ tablet TAKE 1 TABLET(20 MEQ) BY MOUTH DAILY 07/28/17   Reather Littler, MD  TOUJEO SOLOSTAR 300 UNIT/ML SOPN INJECT 40 UNITS INTO THE SKIN DAILY Patient taking differently: Inject 42 Units into the skin daily.  12/30/16   Reather Littler, MD  VICTOZA 18 MG/3ML SOPN INJECT 1.8 MG UNDER THE SKIN DAILY 09/17/17   Reather Littler, MD    Allergies    Patient has no known allergies.  Review of Systems   Review of Systems  Ten systems reviewed and are negative for acute change, except as noted in the HPI.    Physical Exam Updated Vital Signs BP (!) 162/83    Pulse 70    Temp 98.2 F (36.8 C) (Oral)    Resp 18    Ht 5\' 3"  (1.6 m)    Wt 65.8 kg    SpO2 96%    BMI 25.69 kg/m   Physical Exam Vitals and nursing note reviewed.  Constitutional:      General: She is not in acute distress.    Appearance: She is well-developed. She is not diaphoretic.     Comments: Patient in NAD. Nontoxic appearing.  HENT:     Head: Normocephalic and atraumatic.     Mouth/Throat:     Comments: Flattening of the NL fold on the right; unclear if this is chronic. Eyes:     General: No scleral icterus.    Extraocular Movements: Extraocular movements intact.     Conjunctiva/sclera: Conjunctivae normal.  Cardiovascular:     Rate and Rhythm: Normal rate and regular rhythm.       Pulses: Normal pulses.     Comments: AICD to L upper chest.  Pulmonary:     Effort: Pulmonary effort is normal. No respiratory distress.     Comments: Respirations even and unlabored Musculoskeletal:        General: Normal range of motion.     Cervical back: Normal range of motion.     Right lower leg: Edema (1+ pitting) present.     Left lower leg: Edema (trace) present.     Comments: Edema noted to the RUE and hand. No erythema, heat to touch, lymphangitic streaking.  Skin:    General: Skin is warm and dry.     Coloration: Skin is not pale.     Findings: No erythema or rash.  Neurological:     Mental Status: She is alert and oriented to person, place, and time.     Comments: GCS 15. Speech is goal oriented. Answers questions appropriately and follows commands. 4/5 strength to extremities on the right. Normal grip strength and strength against resistance to extremities on the left.  Psychiatric:        Behavior: Behavior normal.     ED Results / Procedures / Treatments   Labs (all labs ordered are listed, but only abnormal results are displayed) Labs Reviewed  COMPREHENSIVE METABOLIC PANEL - Abnormal; Notable for the following components:      Result Value   Glucose, Bld 425 (*)    Albumin 3.4 (*)    Total Bilirubin 0.2 (*)    All other components within normal limits  URINALYSIS, ROUTINE W REFLEX MICROSCOPIC - Abnormal; Notable for the following components:   Color, Urine STRAW (*)    Glucose, UA >=500 (*)    Protein, ur 100 (*)    Leukocytes,Ua TRACE (*)    All other components within normal limits  CBG MONITORING, ED - Abnormal; Notable for the following  components:   Glucose-Capillary 246 (*)    All other components within normal limits  CBC WITH DIFFERENTIAL/PLATELET    EKG None  Radiology CT Angio Chest PE W and/or Wo Contrast  Result Date: 09/02/2019 CLINICAL DATA:  Right upper and right lower extremity edema. Right shoulder pain. Recent travel from  [place] Right arm DVT suspected. Right-sided pain to arm and leg. EXAM: CT ANGIOGRAPHY CHEST WITH CONTRAST TECHNIQUE: Multidetector CT imaging of the chest was performed using the standard protocol during bolus administration of intravenous contrast. Multiplanar CT image reconstructions and MIPs were obtained to evaluate the vascular anatomy. CONTRAST:  65mL OMNIPAQUE IOHEXOL 350 MG/ML SOLN COMPARISON:  Noncontrast chest CT 09/16/2016. FINDINGS: Cardiovascular: There are no filling defects within the pulmonary arteries to suggest pulmonary embolus. Dilatation of the main pulmonary artery 3.8 cm. Cannot assess for filling defects in the right jugular or subclavian veins given phase of contrast. Scattered coronary artery calcifications. Borderline mild cardiomegaly. Pacemaker with leads in the right and left ventricle. Tortuous thoracic aorta with atherosclerosis. Limited assessment for dissection given phase of contrast bolus tailored to pulmonary artery evaluation. Choose fluid in the superior pericardial recess without frank effusion. Mediastinum/Nodes: No enlarged mediastinal or hilar lymph nodes. No esophageal wall thickening. Thyroid gland is obscured by streak artifact. Lungs/Pleura: Linear scarring in the lingula, unchanged from prior exam. No acute airspace disease. 6 mm perifissural nodule at the right lung base is unchanged from 2018 and considered benign. No pulmonary edema or pleural fluid. Trachea and mainstem bronchi are patent. Upper Abdomen: No acute abnormality. Musculoskeletal: There are no acute or suspicious osseous abnormalities. Mild degenerative change of the spine and the sternomanubrial joint. Left pacemaker in place. Review of the MIP images confirms the above findings. IMPRESSION: 1. No pulmonary embolus.  No acute intrathoracic abnormality. 2. Dilatation of the main pulmonary artery, can be seen with pulmonary arterial hypertension. Aortic Atherosclerosis (ICD10-I70.0). Electronically  Signed   By: Narda Rutherford M.D.   On: 09/02/2019 04:30    Procedures Procedures (including critical care time)  Medications Ordered in ED Medications  fentaNYL (SUBLIMAZE) injection 50 mcg (50 mcg Intravenous Given 09/02/19 0320)  iohexol (OMNIPAQUE) 350 MG/ML injection 75 mL (75 mLs Intravenous Contrast Given 09/02/19 0404)  HYDROcodone-acetaminophen (NORCO/VICODIN) 5-325 MG per tablet 1 tablet (1 tablet Oral Given 09/02/19 0542)    ED Course  I have reviewed the triage vital signs and the nursing notes.  Pertinent labs & imaging results that were available during my care of the patient were reviewed by me and considered in my medical decision making (see chart for details).   6:13 AM Patient reassessed.  She states that her pain has improved.  Confirmed that she continues to take Lasix daily.  She denies missing any doses.  I communicated to the patient that her CT looked reassuring and we are awaiting ultrasound to be completed later on this morning to rule out blood clot.  She verbalizes understanding.    MDM Rules/Calculators/A&P                       68 year old female presents to the emergency department for evaluation of right shoulder pain and swelling on her right side.  Per patient, swelling is new x 2 weeks.  Noted to have very obvious edema to her right upper extremity compared to the left.  There is also edema in bilateral lower extremities, but this is slightly worse on the right side as well.  She  endorses a history of CVA while in the [place] in August.  As a result, she does report right-sided weakness which is chronic.  There is evidence of a slight facial droop on the right as well.  Pulses and sensation intact on exam.  She has no recent history of trauma or injury.  Denies chest pain and shortness of breath.  CT PE study completed which is negative.  Given persistent concern for VTE in the extremity due to R side immobility, prolonged travel - will complete  upper and lower extremity venous duplex ultrasounds.  It is quite possible that the patient's swelling may be related to dependent edema associated with lack of use of her R side since her stroke.  Patient care signed out to [facility], PA-C at shift change pending US imaging.  Patient currently reports improvement in R shoulder pain since receiving IV and oral pain medications in the ED.   Final Clinical Impression(s) / ED Diagnoses Final diagnoses:  Acute pain of right shoulder  Edema of right upper extremity  Edema of right lower extremity    Rx / DC Orders ED Discharge Orders    None       Antony Madura, PA-C 09/02/19 7017    Gilda Crease, MD 09/02/19 [phone]

## 2019-09-02 NOTE — ED Notes (Signed)
CBG 246 ?

## 2019-09-02 NOTE — ED Notes (Signed)
Pt resting comfortably without acute needs or distress noted.  Able to assist with reposition in bed.  Water provided.

## 2019-09-02 NOTE — ED Notes (Signed)
C/o increasing pain to R leg, rates it a "8" at this time.  Otherwise unchanged.

## 2019-09-02 NOTE — ED Provider Notes (Signed)
Received patient at signout from [facility].  Refer to provider note for full history and physical examination.  Briefly, patient is a 68 year old female with history of diabetes mellitus, hypertension, hyperlipidemia, CVA in August while in the [place] with residual right-sided deficits presents for evaluation of progressively worsening right upper and lower extremity edema and pain of the right upper extremity/right posterior shoulder.  This reportedly began after returning from the [place] 2 weeks ago.  She is also on Lasix.  Reports medication compliance.  Her pain has improved.  CTA shows no evidence of PE.  She is pending upper and lower extremity duplex studies to rule out DVT.  If negative, symptoms could be secondary to dependent edema from lack of use due to her chronic weakness from CVA.  Will likely require follow-up with PCP.  Physical Exam  BP (!) 162/83   Pulse 70   Temp 98.2 F (36.8 C) (Oral)   Resp 18   Ht 5\' 3"  (1.6 m)   Wt 65.8 kg   SpO2 96%   BMI 25.69 kg/m   Physical Exam Vitals and nursing note reviewed.  Constitutional:      General: She is not in acute distress.    Appearance: She is well-developed.  HENT:     Head: Normocephalic and atraumatic.  Eyes:     General:        Right eye: No discharge.        Left eye: No discharge.     Conjunctiva/sclera: Conjunctivae normal.  Neck:     Vascular: No JVD.     Trachea: No tracheal deviation.  Cardiovascular:     Rate and Rhythm: Normal rate.  Pulmonary:     Effort: Pulmonary effort is normal.     Comments: Speaking in full sentences without difficulty Abdominal:     General: There is no distension.  Musculoskeletal:        General: Tenderness present.     Comments: Decreased active range of motion of the right upper and lower extremities.  Some tenderness to palpation of the right acromioclavicular joint, pectoralis muscle insertion point, and bicipital tendon groove but no erythema or crepitus noted.   Right upper and lower extremity edema compared to the left, worst in the right upper extremity.  Skin:    General: Skin is warm and dry.     Findings: No erythema.  Neurological:     Mental Status: She is alert.     Motor: Weakness present.     Comments: Baseline right-sided deficits since stroke in August.  Psychiatric:        Behavior: Behavior normal.     ED Course/Procedures     Procedures  MDM    Duplex studies negative for DVT in the upper and lower extremities however I spoke with the vascular tech who performed her studies who noted an area of hyper vascularization in the medial right upper extremity as well as lymph node swelling in the right groin.  She is concerned for potential malignancy.  The patient denies any night sweats, unexplained weight loss, history of smoking.  She has no prior history of malignancy.  I am unable to palpate any masses in the right upper extremity.  9:04 AM CONSULT: Spoke with Cristie Hem, OR nurse with Dr. Donzetta Matters with vascular surgery.  He is currently in the operating room but she relayed the results of the duplexes to him.  He will review the ultrasounds independently when he is out of the OR  but recommends consulting oncology for further recommendations.   9:41 AM CONSULT: Spoke with Dr. Dion Body with oncology.  He notes that ultrasound is not reliable test of choice to evaluate for possible malignancy in these areas.  He recommends obtaining CT abdomen pelvis with contrast and CT right upper extremity with contrast.  However this does not have to happen on an emergent basis and if she has reliable follow-up with PCP these can be obtained outpatient.  10:20AM Spoke with patient's husband Aneta Mins and went over results with patient's permission.  He notes that the patient has an appointment scheduled to see her PCP on Tuesday.  They will call PCP today or tomorrow to schedule imaging.  10:55 AM Dr. Randie Heinz called; he has reviewed ultrasounds independently  and notes enlarged lymph nodes in the right groin as well as a solid structure in the right upper arm of unclear etiology.  He notes ultrasound is not the study of choice for further characterization.  He agrees with plan to obtain imaging on an outpatient basis and close PCP follow-up.  I discussed strict ED return precautions with the patient who has verbalized understanding of and agreement with plan and patient is stable for discharge home at this time.       Jeanie Sewer, PA-C 09/02/19 1102    Gilda Crease, MD 09/03/19 [phone]

## 2019-09-03 LAB — URINE CULTURE

## 2019-09-08 ENCOUNTER — Other Ambulatory Visit: Payer: Self-pay | Admitting: Family Medicine

## 2019-09-08 DIAGNOSIS — R599 Enlarged lymph nodes, unspecified: Secondary | ICD-10-CM

## 2019-09-16 ENCOUNTER — Emergency Department (HOSPITAL_COMMUNITY)
Admission: EM | Admit: 2019-09-16 | Discharge: 2019-09-16 | Disposition: A | Discharge status: Home / Self Care | Payer: Medicare Other | Attending: Emergency Medicine | Admitting: Emergency Medicine

## 2019-09-16 ENCOUNTER — Emergency Department (HOSPITAL_COMMUNITY): Payer: Medicare Other

## 2019-09-16 ENCOUNTER — Encounter (HOSPITAL_COMMUNITY): Payer: Self-pay

## 2019-09-16 ENCOUNTER — Other Ambulatory Visit: Payer: Self-pay

## 2019-09-16 DIAGNOSIS — Z794 Long term (current) use of insulin: Secondary | ICD-10-CM | POA: Diagnosis not present

## 2019-09-16 DIAGNOSIS — I10 Essential (primary) hypertension: Secondary | ICD-10-CM | POA: Insufficient documentation

## 2019-09-16 DIAGNOSIS — W19XXXA Unspecified fall, initial encounter: Secondary | ICD-10-CM

## 2019-09-16 DIAGNOSIS — Z79899 Other long term (current) drug therapy: Secondary | ICD-10-CM | POA: Diagnosis not present

## 2019-09-16 DIAGNOSIS — Y999 Unspecified external cause status: Secondary | ICD-10-CM | POA: Diagnosis not present

## 2019-09-16 DIAGNOSIS — R739 Hyperglycemia, unspecified: Secondary | ICD-10-CM

## 2019-09-16 DIAGNOSIS — R112 Nausea with vomiting, unspecified: Secondary | ICD-10-CM | POA: Insufficient documentation

## 2019-09-16 DIAGNOSIS — W010XXA Fall on same level from slipping, tripping and stumbling without subsequent striking against object, initial encounter: Secondary | ICD-10-CM | POA: Insufficient documentation

## 2019-09-16 DIAGNOSIS — Y92000 Kitchen of unspecified non-institutional (private) residence as  the place of occurrence of the external cause: Secondary | ICD-10-CM | POA: Diagnosis not present

## 2019-09-16 DIAGNOSIS — E1165 Type 2 diabetes mellitus with hyperglycemia: Secondary | ICD-10-CM | POA: Diagnosis not present

## 2019-09-16 DIAGNOSIS — Z87891 Personal history of nicotine dependence: Secondary | ICD-10-CM | POA: Diagnosis not present

## 2019-09-16 DIAGNOSIS — Z95810 Presence of automatic (implantable) cardiac defibrillator: Secondary | ICD-10-CM | POA: Insufficient documentation

## 2019-09-16 DIAGNOSIS — Z7982 Long term (current) use of aspirin: Secondary | ICD-10-CM | POA: Insufficient documentation

## 2019-09-16 DIAGNOSIS — Y9301 Activity, walking, marching and hiking: Secondary | ICD-10-CM | POA: Diagnosis not present

## 2019-09-16 DIAGNOSIS — R519 Headache, unspecified: Secondary | ICD-10-CM | POA: Diagnosis not present

## 2019-09-16 LAB — SALICYLATE LEVEL: Salicylate Lvl: <7 mg/dL — ABNORMAL LOW (ref 7.0–30.0)

## 2019-09-16 LAB — COMPREHENSIVE METABOLIC PANEL
ALT: 40 U/L (ref 0–44)
AST: 30 U/L (ref 15–41)
Albumin: 3.7 g/dL (ref 3.5–5.0)
Alkaline Phosphatase: 90 U/L (ref 38–126)
Anion gap: 11 (ref 5–15)
BUN: 26 mg/dL — ABNORMAL HIGH (ref 8–23)
CO2: 23 mmol/L (ref 22–32)
Calcium: 9.1 mg/dL (ref 8.9–10.3)
Chloride: 100 mmol/L (ref 98–111)
Creatinine, Ser: 0.66 mg/dL (ref 0.44–1.00)
GFR calc Af Amer: >60 mL/min (ref >60)
GFR calc non Af Amer: >60 mL/min (ref >60)
Glucose, Bld: 343 mg/dL — ABNORMAL HIGH (ref 70–99)
Potassium: 4.1 mmol/L (ref 3.5–5.1)
Sodium: 134 mmol/L — ABNORMAL LOW (ref 135–145)
Total Bilirubin: 0.8 mg/dL (ref 0.3–1.2)
Total Protein: 7.5 g/dL (ref 6.5–8.1)

## 2019-09-16 LAB — CBC WITH DIFFERENTIAL/PLATELET
Abs Immature Granulocytes: 0.02 10*3/uL (ref 0.00–0.07)
Basophils Absolute: 0.1 10*3/uL (ref 0.0–0.1)
Basophils Relative: 1 %
Eosinophils Absolute: 0 10*3/uL (ref 0.0–0.5)
Eosinophils Relative: 0 %
HCT: 43.4 % (ref 36.0–46.0)
Hemoglobin: 14.9 g/dL (ref 12.0–15.0)
Immature Granulocytes: 0 %
Lymphocytes Relative: 15 %
Lymphs Abs: 1.4 10*3/uL (ref 0.7–4.0)
MCH: 29.5 pg (ref 26.0–34.0)
MCHC: 34.3 g/dL (ref 30.0–36.0)
MCV: 85.9 fL (ref 80.0–100.0)
Monocytes Absolute: 0.2 10*3/uL (ref 0.1–1.0)
Monocytes Relative: 2 %
Neutro Abs: 8.1 10*3/uL — ABNORMAL HIGH (ref 1.7–7.7)
Neutrophils Relative %: 82 %
Platelets: 240 10*3/uL (ref 150–400)
RBC: 5.05 MIL/uL (ref 3.87–5.11)
RDW: 12.5 % (ref 11.5–15.5)
WBC: 9.9 10*3/uL (ref 4.0–10.5)
nRBC: 0 % (ref 0.0–0.2)

## 2019-09-16 LAB — ACETAMINOPHEN LEVEL: Acetaminophen (Tylenol), Serum: 22 ug/mL (ref 10–30)

## 2019-09-16 LAB — CBG MONITORING, ED: Glucose-Capillary: 267 mg/dL — ABNORMAL HIGH (ref 70–99)

## 2019-09-16 MED ORDER — INSULIN ASPART 100 UNIT/ML ~~LOC~~ SOLN: 6.0000 [IU] | Freq: Once | SUBCUTANEOUS | Status: DC

## 2019-09-16 MED ORDER — ONDANSETRON HCL 4 MG/2ML IJ SOLN
4.0000 mg | Freq: Once | INTRAMUSCULAR | Status: AC
  Administered 2019-09-16: 4 mg via INTRAVENOUS
  Filled 2019-09-16: qty 2

## 2019-09-16 NOTE — ED Notes (Signed)
Pt transported to CT ?

## 2019-09-16 NOTE — ED Notes (Signed)
Pt SpO2 91% on RA. 2L [place] applied, SpO2 currently 98%.

## 2019-09-16 NOTE — Discharge Instructions (Addendum)
Continue your home medications.  Monitor for abdominal pain , shortness of breath and fever.  Return to the ED for worsening symptoms.  Follow-up with your primary care doctor

## 2019-09-16 NOTE — ED Provider Notes (Signed)
[place] EMERGENCY DEPARTMENT Provider Note   CSN: [id number] Arrival date & time: 09/16/19  3220     History Chief Complaint  Patient presents with  . Emesis  . Fall    Linda Lowe is a 68 y.o. female.  HPI   Patient presents to the emergency room for evaluation of a fall.  Patient has a history of a stroke back in August.  She has right-sided deficits but is still able to get around at home.  Patient states she was walking to the kitchen when she lost her balance and fell landing on her right side.  Husband found pt on the cough and she had vomited.Marland Kitchen  Her husband  911 was called.  She was brought to the ED for evaluation.  Patient was found and her pill bottles were around her.  Patient admits to feeling depressed but denies having any thoughts of suicide.  She denies any ingestion.  She denies headache right now.  She denies chest pain.  No fevers or chills.  No new weakness.  Patient states her shoulder is hurting her now after the fall.  Discussed with husband.  He was not aware she fell.  Past Medical History:  Diagnosis Date  . AICD (automatic cardioverter/defibrillator) present    [employer] Jude   . Arthritis   . Cardiomyopathy ([place])    a. 05/2012 Echo: EF 30-35%, Gr 1 DD, mild LVH, mild MR, pericardial effusion  . Diabetes mellitus    a. Dx > 5 y ago  . Dyspnea   . Dysrhythmia   . Hypercholesteremia   . Hypertension   . Pericardial effusion    a. 05/2012 Echo: circumferential pericardial effusion, mostly 50mm, largest pocket posterior - 70mm, mild RA indentation, no RV collapse, no tamponade.    Patient Active Problem List   Diagnosis Date Noted  . Right patella fracture 07/08/2017  . Hyponatremia 03/22/2017  . SIRS (systemic inflammatory response syndrome) ([place]) 03/22/2017  . Diarrhea 03/22/2017  . Elevated troponin 03/22/2017  . Uncontrolled type 2 diabetes mellitus with hyperglycemia, with long-term current use of insulin ([place]) 09/28/2016  .  Nonischemic cardiomyopathy ([place]) 08/26/2013  . Chronic systolic heart failure ([place]) 08/26/2013  . LBBB (left bundle branch block) 08/26/2013  . Hyperlipidemia 06/23/2013  . Essential hypertension 05/23/2012    Past Surgical History:  Procedure Laterality Date  . ABDOMINAL HYSTERECTOMY    . BI-VENTRICULAR IMPLANTABLE CARDIOVERTER DEFIBRILLATOR N/A 12/01/2013   Procedure: BI-VENTRICULAR IMPLANTABLE CARDIOVERTER DEFIBRILLATOR  (CRT-D);  Surgeon: Deboraha Sprang, MD;  Location: [facility] CATH LAB;  Service: Cardiovascular;  Laterality: N/A;  . FOOT SURGERY    . LEFT AND RIGHT HEART CATHETERIZATION WITH CORONARY ANGIOGRAM  05/26/2012   Procedure: LEFT AND RIGHT HEART CATHETERIZATION WITH CORONARY ANGIOGRAM;  Surgeon: Sherren Mocha, MD;  [facility];  Service: Cardiovascular;;  . ORIF PATELLA Right 07/08/2017   Procedure: OPEN REDUCTION INTERNAL (ORIF) FIXATION PATELLA;  Surgeon: Renette Butters, MD;  Location: [place];  Service: Orthopedics;  Laterality: Right;     OB History   No obstetric history on file.     Family History  Problem Relation Age of Onset  . Other Other        No history of CAD  . Tuberculosis Father        died when pt was young.    Social History   Tobacco Use  . Smoking status: Former Research scientist (life sciences)  . Smokeless tobacco: Never Used  . Tobacco  comment: Quit "long time ago."  Substance Use Topics  . Alcohol use: No  . Drug use: No    Home Medications Prior to Admission medications   Medication Sig Start Date End Date Taking? Authorizing Provider  acetaminophen (TYLENOL) 500 MG tablet Take 1 tablet (500 mg total) by mouth every 6 (six) hours as needed. 09/02/19   Luevenia Maxin, Mina A, PA-C  amLODipine (NORVASC) 5 MG tablet Take 5 mg by mouth once daily 09/16/16   [provider]  aspirin EC 325 MG tablet Take 1 tablet (325 mg total) by mouth daily. For 30 days post op for DVT Prophylaxis 07/08/17   Albina Billet III, PA-C  atorvastatin (LIPITOR) 40 MG  tablet TAKE 1 TABLET(40 MG) BY MOUTH DAILY 12/02/17   Reather Littler, MD  B-D UF III MINI PEN NEEDLES 31G X 5 MM MISC USE 5 NEEDLES EVERY DAY AS DIRECTED 09/12/17   Reather Littler, MD  baclofen (LIORESAL) 10 MG tablet Take 1 tablet (10 mg total) by mouth 3 (three) times daily as needed for muscle spasms. 07/08/17   Albina Billet III, PA-C  carvedilol (COREG) 25 MG tablet Take 25 mg by mouth 2 (two) times daily. 07/15/15   [provider]  docusate sodium (COLACE) 100 MG capsule Take 1 capsule (100 mg total) by mouth 2 (two) times daily. To prevent constipation while taking pain medication. 07/08/17   Martensen, Lucretia Kern III, PA-C  EPINEPHrine (EPIPEN) 0.3 mg/0.3 mL DEVI Inject 0.3 mg into the muscle as needed (for allergic reaction). For allergic reactions     [provider]  furosemide (LASIX) 20 MG tablet Take 20 mg by mouth 2 (two) times daily.     [provider]  glucose blood (CONTOUR NEXT TEST) test strip Use as instructed 10/16/17   Reather Littler, MD  glucose blood (ONETOUCH VERIO) test strip USE TO TEST BLOOD SUGAR 3 TIMES DAILY 11/04/17   Reather Littler, MD  insulin aspart (NOVOLOG FLEXPEN) 100 UNIT/ML FlexPen ADMINISTER 16 UNITS UNDER THE SKIN TWICE DAILY BEFORE LUNCH AND SUPPER 04/17/17   Reather Littler, MD  Insulin Pen Needle (B-D UF III MINI PEN NEEDLES) 31G X 5 MM MISC USE 5 PEN NEEDLES PER DAY AS INSTRUCTED Patient not taking: Reported on 07/03/2017 10/14/16   Reather Littler, MD  Insulin Pen Needle (B-D UF III MINI PEN NEEDLES) 31G X 5 MM MISC USE 5 NEEDLES EVERY DAY AS DIRECTED 09/12/17   Reather Littler, MD  INVOKAMET [phone] MG TABS TAKE 1 TABLET BY MOUTH TWICE DAILY 10/03/17   Reather Littler, MD  irbesartan (AVAPRO) 300 MG tablet Take 300 mg by mouth daily.    [provider]  liraglutide (VICTOZA) 18 MG/3ML SOPN ADMINISTER 1.8 MG UNDER THE SKIN DAILY 10/08/17   Reather Littler, MD  naproxen (NAPROSYN) 500 MG tablet Take 500 mg by mouth 2 (two) times daily as  needed for mild pain.     [provider]  NOVOLOG FLEXPEN 100 UNIT/ML FlexPen ADMINISTER 16 UNITS UNDER THE SKIN TWICE DAILY BEFORE LUNCH AND SUPPER 07/24/17   Reather Littler, MD  omeprazole (PRILOSEC) 20 MG capsule Take 1 capsule (20 mg total) by mouth daily. 30 days for gastroprotection while taking Aspirin. 07/08/17   Martensen, Lucretia Kern III, PA-C  ondansetron (ZOFRAN) 4 MG tablet Take 1 tablet (4 mg total) by mouth every 8 (eight) hours as needed for nausea or vomiting. 07/08/17   Martensen, Lucretia Kern III, PA-C  ONETOUCH DELICA LANCETS 33G MISC USE  TO TEST BLOOD SUGAR LEVELS THREE TIMES DAILY 09/17/17   Reather Littler, MD  oxybutynin (DITROPAN-XL) 5 MG 24 hr tablet Take 5 mg by mouth daily.    [provider]  oxyCODONE-acetaminophen (ROXICET) 5-325 MG tablet Take 1-2 tablets by mouth every 4 (four) hours as needed for severe pain. 07/08/17   Martensen, Lucretia Kern III, PA-C  potassium chloride SA (K-DUR,KLOR-CON) 20 MEQ tablet TAKE 1 TABLET(20 MEQ) BY MOUTH DAILY 12/01/16   Reather Littler, MD  potassium chloride SA (K-DUR,KLOR-CON) 20 MEQ tablet TAKE 1 TABLET(20 MEQ) BY MOUTH DAILY 07/28/17   Reather Littler, MD  TOUJEO SOLOSTAR 300 UNIT/ML SOPN INJECT 40 UNITS INTO THE SKIN DAILY Patient taking differently: Inject 42 Units into the skin daily.  12/30/16   Reather Littler, MD  VICTOZA 18 MG/3ML SOPN INJECT 1.8 MG UNDER THE SKIN DAILY 09/17/17   Reather Littler, MD    Allergies    Patient has no known allergies.  Review of Systems   Review of Systems  All other systems reviewed and are negative.   Physical Exam Updated Vital Signs BP (!) 127/59   Pulse 63   Temp 99.3 F (37.4 C) (Oral)   Resp 12   Ht 1.6 m (5\' 3" )   Wt 63.5 kg   SpO2 91%   BMI 24.80 kg/m   Physical Exam Vitals and nursing note reviewed.  Constitutional:      General: She is not in acute distress.    Appearance: She is well-developed.  HENT:     Head: Normocephalic and atraumatic.     Right Ear: External  ear normal.     Left Ear: External ear normal.  Eyes:     General: No scleral icterus.       Right eye: No discharge.        Left eye: No discharge.     Conjunctiva/sclera: Conjunctivae normal.  Neck:     Trachea: No tracheal deviation.  Cardiovascular:     Rate and Rhythm: Normal rate and regular rhythm.  Pulmonary:     Effort: Pulmonary effort is normal. No respiratory distress.     Breath sounds: Normal breath sounds. No stridor. No wheezing or rales.  Abdominal:     General: Bowel sounds are normal. There is no distension.     Palpations: Abdomen is soft.     Tenderness: There is no abdominal tenderness. There is no guarding or rebound.  Musculoskeletal:        General: No tenderness.     Cervical back: Neck supple.  Skin:    General: Skin is warm and dry.     Findings: No rash.  Neurological:     Mental Status: She is alert.     Cranial Nerves: No cranial nerve deficit (no facial droop, extraocular movements intact, no slurred speech).     Sensory: No sensory deficit.     Motor: Weakness and abnormal muscle tone present. No seizure activity.     Coordination: Coordination normal.     Comments: Arm held in flexion, contraction deformity noted, weakness of the right upper extremity and right lower extremity though patient still has some movement  Psychiatric:        Behavior: Behavior normal.        Thought Content: Thought content normal.        Judgment: Judgment normal.     ED Results / Procedures / Treatments   Labs (all labs ordered are listed, but only abnormal results are displayed)  Labs Reviewed  CBC WITH DIFFERENTIAL/PLATELET - Abnormal; Notable for the following components:      Result Value   Neutro Abs 8.1 (*)    All other components within normal limits  COMPREHENSIVE METABOLIC PANEL - Abnormal; Notable for the following components:   Sodium 134 (*)    Glucose, Bld 343 (*)    BUN 26 (*)    All other components within normal limits  SALICYLATE LEVEL -  Abnormal; Notable for the following components:   Salicylate Lvl <7.0 (*)    All other components within normal limits  ACETAMINOPHEN LEVEL    EKG EKG Interpretation  Date/Time:  Thursday September 16 2019 09:58:05 EST Ventricular Rate:  66 PR Interval:    QRS Duration: 169 QT Interval:  460 QTC Calculation: 482 R Axis:   127 Text Interpretation: Sinus rhythm Nonspecific intraventricular conduction delay Borderline repol abnrm, inferolateral leads No significant change since last tracing Confirmed by Linwood Dibbles [phone]) on 09/16/2019 10:05:31 AM   Radiology DG Shoulder Right  Result Date: 09/16/2019 CLINICAL DATA:  Fall, shoulder pain EXAM: RIGHT SHOULDER - 2+ VIEW COMPARISON:  None. FINDINGS: Alignment is anatomic. There is no acute fracture. Joint spaces are preserved. IMPRESSION: No significant osseous abnormality. Electronically Signed   By: Guadlupe Spanish M.D.   On: 09/16/2019 11:00   CT Head Wo Contrast  Result Date: 09/16/2019 CLINICAL DATA:  Fall, hit head EXAM: CT HEAD WITHOUT CONTRAST TECHNIQUE: Contiguous axial images were obtained from the base of the skull through the vertex without intravenous contrast. COMPARISON:  None. FINDINGS: Brain: There is no acute intracranial hemorrhage, mass-effect, or edema. There is no acute appearing loss of gray differentiation. There are chronic infarcts of the right occipital lobe and left pons. There is no extra-axial fluid collection. Ventricles and sulci are within normal limits in size and configuration. Vascular: There is atherosclerotic calcification at the skull base. Skull: Calvarium is unremarkable. Sinuses/Orbits: Mild paranasal sinus mucosal thickening. Orbits are unremarkable. Other: Mastoid air cells are clear. IMPRESSION: No evidence of acute intracranial injury. 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 Electronically Signed   By: Guadlupe Spanish M.D.   On: 09/16/2019 10:55   CT Cervical Spine Wo Contrast  Result  Date: 09/16/2019 CLINICAL DATA:  Fall EXAM: CT CERVICAL SPINE WITHOUT CONTRAST TECHNIQUE: Multidetector CT imaging of the cervical spine was performed without intravenous contrast. Multiplanar CT image reconstructions were also generated. COMPARISON:  None. FINDINGS: Alignment: No significant listhesis. Skull base and vertebrae: Vertebral body heights are maintained. There is no acute fracture. Soft tissues and spinal canal: No prevertebral fluid or swelling. No visible canal hematoma. Disc levels:  Intervertebral disc heights are maintained. Upper chest: No apical lung mass. Other: Subcentimeter right thyroid nodule. No further follow-up is required. IMPRESSION: No acute cervical spine fracture. Electronically Signed   By: Guadlupe Spanish M.D.   On: 09/16/2019 10:59    Procedures Procedures (including critical care time)  Medications Ordered in ED Medications  ondansetron (ZOFRAN) injection 4 mg (4 mg Intravenous Given 09/16/19 1002)    ED Course  I have reviewed the triage vital signs and the nursing notes.  Pertinent labs & imaging results that were available during my care of the patient were reviewed by me and considered in my medical decision making (see chart for details).  Clinical Course as of Sep 15 1200  Thu Sep 16, 2019  1127 Labs and x-rays reviewed.  No signs of serious injury.  Laboratory tests are unremarkable.   [  JK]  1159 Discussed findings with patient and husband   [JK]  1159 Patient states she is feeling well.  No complaints.  She is ready for discharge.   [JK]    Clinical Course User Index [JK] Linwood DibblesKnapp, Jennamarie Goings, MD   MDM Rules/Calculators/A&P                      Pt states she fell at home.  Husband was not aware.  ED workup reassuring.  No signs of serious injury.  Hyperglycemia noted but no complications associated with that.  Pt given dose of insulin.  Pt vomited at home.  No complaints of abdominal pain.  ED workup reassuring.  No further vomiting in the ED.   Etiology unclear but stable for discharge.  Pt admits to depression but denies ingestion.  Labs negative.  Doubt SI or overdose.  Stable for outpatient management. Final Clinical Impression(s) / ED Diagnoses Final diagnoses:  Fall in home, initial encounter  Nausea and vomiting, intractability of vomiting not specified, unspecified vomiting type    Rx / DC Orders ED Discharge Orders    None       Linwood DibblesKnapp, Emari Hreha, MD 09/16/19 1204

## 2019-09-16 NOTE — ED Triage Notes (Signed)
Pt arrived by [facility] with complaints of emesis and possible OD of percocet 5mg /325. EMS reports pt had a stroke X1 month ago with right sided deficits and is cared for by husband who is overwhelmed as the caregiver and is not being kind to pt. Husband found pt on floor by couch with vomit and percocets X3 in vomit. Unknown how much was ingested if any. Husband gives wife meds and had not given her any. Daily meds have not been taken this morning.   BP 216/105 98.1 70 Paced 18 RR 97% RA  CBG 414

## 2019-09-18 ENCOUNTER — Emergency Department (HOSPITAL_COMMUNITY): Payer: Medicare Other

## 2019-09-18 ENCOUNTER — Emergency Department (HOSPITAL_COMMUNITY)
Admission: EM | Admit: 2019-09-18 | Discharge: 2019-09-18 | Disposition: A | Discharge status: Home / Self Care | Payer: Medicare Other | Attending: Emergency Medicine | Admitting: Emergency Medicine

## 2019-09-18 ENCOUNTER — Other Ambulatory Visit: Payer: Self-pay

## 2019-09-18 ENCOUNTER — Encounter (HOSPITAL_COMMUNITY): Payer: Self-pay | Admitting: Emergency Medicine

## 2019-09-18 DIAGNOSIS — Z79899 Other long term (current) drug therapy: Secondary | ICD-10-CM | POA: Insufficient documentation

## 2019-09-18 DIAGNOSIS — Z794 Long term (current) use of insulin: Secondary | ICD-10-CM | POA: Insufficient documentation

## 2019-09-18 DIAGNOSIS — I110 Hypertensive heart disease with heart failure: Secondary | ICD-10-CM | POA: Insufficient documentation

## 2019-09-18 DIAGNOSIS — R002 Palpitations: Secondary | ICD-10-CM

## 2019-09-18 DIAGNOSIS — E119 Type 2 diabetes mellitus without complications: Secondary | ICD-10-CM | POA: Insufficient documentation

## 2019-09-18 DIAGNOSIS — I5022 Chronic systolic (congestive) heart failure: Secondary | ICD-10-CM | POA: Insufficient documentation

## 2019-09-18 DIAGNOSIS — Z87891 Personal history of nicotine dependence: Secondary | ICD-10-CM | POA: Insufficient documentation

## 2019-09-18 DIAGNOSIS — R112 Nausea with vomiting, unspecified: Secondary | ICD-10-CM | POA: Diagnosis not present

## 2019-09-18 LAB — CBC WITH DIFFERENTIAL/PLATELET
Abs Immature Granulocytes: 0.02 10*3/uL (ref 0.00–0.07)
Basophils Absolute: 0 10*3/uL (ref 0.0–0.1)
Basophils Relative: 1 %
Eosinophils Absolute: 0.2 10*3/uL (ref 0.0–0.5)
Eosinophils Relative: 2 %
HCT: 44.3 % (ref 36.0–46.0)
Hemoglobin: 14.8 g/dL (ref 12.0–15.0)
Immature Granulocytes: 0 %
Lymphocytes Relative: 33 %
Lymphs Abs: 2.4 10*3/uL (ref 0.7–4.0)
MCH: 29.4 pg (ref 26.0–34.0)
MCHC: 33.4 g/dL (ref 30.0–36.0)
MCV: 87.9 fL (ref 80.0–100.0)
Monocytes Absolute: 0.5 10*3/uL (ref 0.1–1.0)
Monocytes Relative: 7 %
Neutro Abs: 4.3 10*3/uL (ref 1.7–7.7)
Neutrophils Relative %: 57 %
Platelets: 207 10*3/uL (ref 150–400)
RBC: 5.04 MIL/uL (ref 3.87–5.11)
RDW: 12.7 % (ref 11.5–15.5)
WBC: 7.5 10*3/uL (ref 4.0–10.5)
nRBC: 0 % (ref 0.0–0.2)

## 2019-09-18 LAB — BASIC METABOLIC PANEL
Anion gap: 8 (ref 5–15)
BUN: 10 mg/dL (ref 8–23)
CO2: 26 mmol/L (ref 22–32)
Calcium: 8.9 mg/dL (ref 8.9–10.3)
Chloride: 105 mmol/L (ref 98–111)
Creatinine, Ser: 0.44 mg/dL (ref 0.44–1.00)
GFR calc Af Amer: >60 mL/min (ref >60)
GFR calc non Af Amer: >60 mL/min (ref >60)
Glucose, Bld: 160 mg/dL — ABNORMAL HIGH (ref 70–99)
Potassium: 3.6 mmol/L (ref 3.5–5.1)
Sodium: 139 mmol/L (ref 135–145)

## 2019-09-18 NOTE — ED Triage Notes (Signed)
Pt to triage via [facility] for rapid HR this morning.  HR paced at 60 per EMS.  Denies dizziness, chest pain or SOB.  No other associated symptoms.  R sided deficits from previous CVA.

## 2019-09-18 NOTE — Discharge Instructions (Signed)
Follow-up with your doctor on Monday as planned.  Return to ER for worsening or concerning symptoms.

## 2019-09-18 NOTE — ED Provider Notes (Signed)
[facility] EMERGENCY DEPARTMENT Provider Note   CSN: [id number] Arrival date & time: 09/18/19  0735     History Chief Complaint  Patient presents with  . Palpitations    Linda Lowe is a 69 y.o. female.  69 year old female with history of CHF, cardiomyopathy, diabetes, AICD ([employer]), hypertension, hyperlipidemia, presents with complaint of feeling like her heart is beating hard.  Awoke at 6 AM today and felt her heart was beating hard, woke her husband up and asked him to feel her chest wall, husband said everything was fine.  Patient continued to feel like her heart was beating hard and called EMS.  Patient had a dual paced rhythm with a rate of 60 with EMS.  Patient reports constant feeling of heart pounding since 6 AM, no relief in her symptoms, nothing makes this feeling better or worse.  Denies chest pain, shortness of breath, diaphoresis.  Patient states that she did feel nauseous and had one episode of vomiting just prior to EMS arrival.  No other complaints or concerns.        Past Medical History:  Diagnosis Date  . AICD (automatic cardioverter/defibrillator) present    [employer] Jude   . Arthritis   . Cardiomyopathy (HCC)    a. 05/2012 Echo: EF 30-35%, Gr 1 DD, mild LVH, mild MR, pericardial effusion  . Diabetes mellitus    a. Dx > 5 y ago  . Dyspnea   . Dysrhythmia   . Hypercholesteremia   . Hypertension   . Pericardial effusion    a. 05/2012 Echo: circumferential pericardial effusion, mostly 71mm, largest pocket posterior - 41mm, mild RA indentation, no RV collapse, no tamponade.    Patient Active Problem List   Diagnosis Date Noted  . Right patella fracture 07/08/2017  . Hyponatremia 03/22/2017  . SIRS (systemic inflammatory response syndrome) (HCC) 03/22/2017  . Diarrhea 03/22/2017  . Elevated troponin 03/22/2017  . Uncontrolled type 2 diabetes mellitus with hyperglycemia, with long-term current use of insulin (HCC) 09/28/2016  . Nonischemic  cardiomyopathy (HCC) 08/26/2013  . Chronic systolic heart failure (HCC) 08/26/2013  . LBBB (left bundle branch block) 08/26/2013  . Hyperlipidemia 06/23/2013  . Essential hypertension 05/23/2012    Past Surgical History:  Procedure Laterality Date  . ABDOMINAL HYSTERECTOMY    . BI-VENTRICULAR IMPLANTABLE CARDIOVERTER DEFIBRILLATOR N/A 12/01/2013   Procedure: BI-VENTRICULAR IMPLANTABLE CARDIOVERTER DEFIBRILLATOR  (CRT-D);  Surgeon: Duke Salvia, MD;  Location: [facility] CATH LAB;  Service: Cardiovascular;  Laterality: N/A;  . FOOT SURGERY    . LEFT AND RIGHT HEART CATHETERIZATION WITH CORONARY ANGIOGRAM  05/26/2012   Procedure: LEFT AND RIGHT HEART CATHETERIZATION WITH CORONARY ANGIOGRAM;  Surgeon: Tonny Bollman, MD;  Location: [facility] CATH LAB;  Service: Cardiovascular;;  . ORIF PATELLA Right 07/08/2017   Procedure: OPEN REDUCTION INTERNAL (ORIF) FIXATION PATELLA;  Surgeon: Sheral Apley, MD;  Location: [facility] OR;  Service: Orthopedics;  Laterality: Right;     OB History   No obstetric history on file.     Family History  Problem Relation Age of Onset  . Other Other        No history of CAD  . Tuberculosis Father        died when pt was young.    Social History   Tobacco Use  . Smoking status: Former Games developer  . Smokeless tobacco: Never Used  . Tobacco comment: Quit "long time ago."  Substance Use Topics  . Alcohol use: No  . Drug use:  No    Home Medications Prior to Admission medications   Medication Sig Start Date End Date Taking? Authorizing Provider  acetaminophen (TYLENOL) 500 MG tablet Take 1 tablet (500 mg total) by mouth every 6 (six) hours as needed. 09/02/19   Luevenia Maxin, Mina A, PA-C  amLODipine (NORVASC) 5 MG tablet Take 5 mg by mouth once daily 09/16/16   [provider]  aspirin EC 325 MG tablet Take 1 tablet (325 mg total) by mouth daily. For 30 days post op for DVT Prophylaxis 07/08/17   Albina Billet III, PA-C  atorvastatin (LIPITOR) 40 MG tablet TAKE  1 TABLET(40 MG) BY MOUTH DAILY 12/02/17   Reather Littler, MD  B-D UF III MINI PEN NEEDLES 31G X 5 MM MISC USE 5 NEEDLES EVERY DAY AS DIRECTED 09/12/17   Reather Littler, MD  baclofen (LIORESAL) 10 MG tablet Take 1 tablet (10 mg total) by mouth 3 (three) times daily as needed for muscle spasms. 07/08/17   Albina Billet III, PA-C  carvedilol (COREG) 25 MG tablet Take 25 mg by mouth 2 (two) times daily. 07/15/15   [provider]  docusate sodium (COLACE) 100 MG capsule Take 1 capsule (100 mg total) by mouth 2 (two) times daily. To prevent constipation while taking pain medication. 07/08/17   Martensen, Lucretia Kern III, PA-C  EPINEPHrine (EPIPEN) 0.3 mg/0.3 mL DEVI Inject 0.3 mg into the muscle as needed (for allergic reaction). For allergic reactions     [provider]  furosemide (LASIX) 20 MG tablet Take 20 mg by mouth 2 (two) times daily.     [provider]  glucose blood (CONTOUR NEXT TEST) test strip Use as instructed 10/16/17   Reather Littler, MD  glucose blood (ONETOUCH VERIO) test strip USE TO TEST BLOOD SUGAR 3 TIMES DAILY 11/04/17   Reather Littler, MD  insulin aspart (NOVOLOG FLEXPEN) 100 UNIT/ML FlexPen ADMINISTER 16 UNITS UNDER THE SKIN TWICE DAILY BEFORE LUNCH AND SUPPER 04/17/17   Reather Littler, MD  Insulin Pen Needle (B-D UF III MINI PEN NEEDLES) 31G X 5 MM MISC USE 5 PEN NEEDLES PER DAY AS INSTRUCTED Patient not taking: Reported on 07/03/2017 10/14/16   Reather Littler, MD  Insulin Pen Needle (B-D UF III MINI PEN NEEDLES) 31G X 5 MM MISC USE 5 NEEDLES EVERY DAY AS DIRECTED 09/12/17   Reather Littler, MD  INVOKAMET [phone] MG TABS TAKE 1 TABLET BY MOUTH TWICE DAILY 10/03/17   Reather Littler, MD  irbesartan (AVAPRO) 300 MG tablet Take 300 mg by mouth daily.    [provider]  liraglutide (VICTOZA) 18 MG/3ML SOPN ADMINISTER 1.8 MG UNDER THE SKIN DAILY 10/08/17   Reather Littler, MD  naproxen (NAPROSYN) 500 MG tablet Take 500 mg by mouth 2 (two) times daily as needed for mild  pain.     [provider]  NOVOLOG FLEXPEN 100 UNIT/ML FlexPen ADMINISTER 16 UNITS UNDER THE SKIN TWICE DAILY BEFORE LUNCH AND SUPPER 07/24/17   Reather Littler, MD  omeprazole (PRILOSEC) 20 MG capsule Take 1 capsule (20 mg total) by mouth daily. 30 days for gastroprotection while taking Aspirin. 07/08/17   Martensen, Lucretia Kern III, PA-C  ondansetron (ZOFRAN) 4 MG tablet Take 1 tablet (4 mg total) by mouth every 8 (eight) hours as needed for nausea or vomiting. 07/08/17   Martensen, Lucretia Kern III, PA-C  ONETOUCH DELICA LANCETS 33G MISC USE TO TEST BLOOD SUGAR LEVELS THREE TIMES DAILY 09/17/17   Reather Littler, MD  oxybutynin (DITROPAN-XL) 5  MG 24 hr tablet Take 5 mg by mouth daily.    [provider]  oxyCODONE-acetaminophen (ROXICET) 5-325 MG tablet Take 1-2 tablets by mouth every 4 (four) hours as needed for severe pain. 07/08/17   Martensen, Lucretia Kern III, PA-C  potassium chloride SA (K-DUR,KLOR-CON) 20 MEQ tablet TAKE 1 TABLET(20 MEQ) BY MOUTH DAILY 12/01/16   Reather Littler, MD  potassium chloride SA (K-DUR,KLOR-CON) 20 MEQ tablet TAKE 1 TABLET(20 MEQ) BY MOUTH DAILY 07/28/17   Reather Littler, MD  TOUJEO SOLOSTAR 300 UNIT/ML SOPN INJECT 40 UNITS INTO THE SKIN DAILY Patient taking differently: Inject 42 Units into the skin daily.  12/30/16   Reather Littler, MD  VICTOZA 18 MG/3ML SOPN INJECT 1.8 MG UNDER THE SKIN DAILY 09/17/17   Reather Littler, MD    Allergies    Patient has no known allergies.  Review of Systems   Review of Systems  Constitutional: Negative for diaphoresis and fever.  Respiratory: Negative for cough and shortness of breath.   Cardiovascular: Positive for palpitations. Negative for chest pain and leg swelling.  Gastrointestinal: Positive for nausea and vomiting. Negative for abdominal pain.       Resolved  Musculoskeletal: Negative for arthralgias, back pain, myalgias and neck pain.  Skin: Negative for rash and wound.  Allergic/Immunologic: Positive for  immunocompromised state.  Neurological: Negative for dizziness, weakness and light-headedness.  All other systems reviewed and are negative.   Physical Exam Updated Vital Signs BP (!) 155/78   Pulse 60   Temp 98.4 F (36.9 C) (Oral)   Resp 15   SpO2 98%   Physical Exam Vitals and nursing note reviewed.  Constitutional:      General: She is not in acute distress.    Appearance: She is well-developed. She is not diaphoretic.  HENT:     Head: Normocephalic and atraumatic.  Cardiovascular:     Rate and Rhythm: Normal rate and regular rhythm.     Pulses: Normal pulses.     Heart sounds: Normal heart sounds.  Pulmonary:     Effort: Pulmonary effort is normal.     Breath sounds: Normal breath sounds.  Abdominal:     Palpations: Abdomen is soft.     Tenderness: There is no abdominal tenderness.  Musculoskeletal:     Right lower leg: No edema.     Left lower leg: No edema.  Skin:    General: Skin is warm and dry.     Findings: No erythema or rash.  Neurological:     Mental Status: She is alert and oriented to person, place, and time.  Psychiatric:        Behavior: Behavior normal.     ED Results / Procedures / Treatments   Labs (all labs ordered are listed, but only abnormal results are displayed) Labs Reviewed  BASIC METABOLIC PANEL - Abnormal; Notable for the following components:      Result Value   Glucose, Bld 160 (*)    All other components within normal limits  CBC WITH DIFFERENTIAL/PLATELET    EKG EKG Interpretation  Date/Time:  Saturday September 18 2019 07:53:46 EST Ventricular Rate:  62 PR Interval:    QRS Duration: 148 QT Interval:  506 QTC Calculation: 513 R Axis:   -151 Text Interpretation: AV dual-paced rhythm Abnormal ECG Confirmed by Virgina Norfolk [phone]) on 09/18/2019 9:45:52 AM   Radiology DG Chest Port 1 View  Result Date: 09/18/2019 CLINICAL DATA:  Rapid heart rate this morning. No chest pain or shortness  of breath. EXAM: PORTABLE CHEST 1  VIEW COMPARISON:  09/16/2016; 12/02/2013; chest CT-09/16/2016 FINDINGS: Grossly unchanged enlarged cardiac silhouette and mediastinal contours with stable prominence of the central pulmonary vasculature. Stable position of support apparatus. No focal airspace opacities. No pleural effusion or pneumothorax. No evidence of edema. No acute osseous abnormalities. Stigmata of DISH within the thoracic spine. IMPRESSION: Similar findings of cardiomegaly without superimposed acute cardiopulmonary disease. Electronically Signed   By: Sandi Mariscal M.D.   On: 09/18/2019 10:22    Procedures Procedures (including critical care time)  Medications Ordered in ED Medications - No data to display  ED Course  I have reviewed the triage vital signs and the nursing notes.  Pertinent labs & imaging results that were available during my care of the patient were reviewed by me and considered in my medical decision making (see chart for details).  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   [LM]  7440 69 year old female with complaint of "my heart is beating hard."  Onset of sensation at 6 AM, has been constant since.  No associated shortness of breath or chest pain.  On exam patient is well-appearing, EKG with paced rhythm.  Device interrogated without acute abnormality.  Chest x-ray without acute findings.  Offered reassurance.  Patient is planning to follow-up with her cardiologist on Monday as previously scheduled, advised to return to ER for any new or worsening symptoms.   [LM]    Clinical Course User Index [LM] Roque Lias   MDM Rules/Calculators/A&P                       Final Clinical Impression(s) / ED Diagnoses Final diagnoses:  Palpitations    Rx / DC Orders ED Discharge Orders    None       Tacy Learn, PA-C 09/18/19 1216    Lennice Sites, DO 09/18/19  1341

## 2019-09-20 ENCOUNTER — Ambulatory Visit
Admission: RE | Admit: 2019-09-20 | Discharge: 2019-09-20 | Disposition: A | Discharge status: Home / Self Care | Payer: Medicare Other | Source: Ambulatory Visit | Attending: Family Medicine | Admitting: Family Medicine

## 2019-09-20 DIAGNOSIS — R599 Enlarged lymph nodes, unspecified: Secondary | ICD-10-CM

## 2019-09-20 MED ORDER — IOPAMIDOL (ISOVUE-370) INJECTION 76%
75.0000 mL | Freq: Once | INTRAVENOUS | Status: AC | PRN
  Administered 2019-09-20: 75 mL via INTRAVENOUS

## 2019-09-21 ENCOUNTER — Ambulatory Visit (INDEPENDENT_AMBULATORY_CARE_PROVIDER_SITE_OTHER): Payer: Medicare Other | Admitting: Cardiology

## 2019-09-21 ENCOUNTER — Encounter: Payer: Self-pay | Admitting: Cardiology

## 2019-09-21 ENCOUNTER — Other Ambulatory Visit: Payer: Self-pay

## 2019-09-21 VITALS — BP 154/76 | HR 62 | Temp 97.4°F | Resp 18 | Ht 63.0 in | Wt 140.8 lb

## 2019-09-21 DIAGNOSIS — I447 Left bundle-branch block, unspecified: Secondary | ICD-10-CM

## 2019-09-21 DIAGNOSIS — I428 Other cardiomyopathies: Secondary | ICD-10-CM

## 2019-09-21 DIAGNOSIS — Z95810 Presence of automatic (implantable) cardiac defibrillator: Secondary | ICD-10-CM

## 2019-09-21 DIAGNOSIS — E7800 Pure hypercholesterolemia, unspecified: Secondary | ICD-10-CM

## 2019-09-21 DIAGNOSIS — I5022 Chronic systolic (congestive) heart failure: Secondary | ICD-10-CM | POA: Diagnosis not present

## 2019-09-21 DIAGNOSIS — I10 Essential (primary) hypertension: Secondary | ICD-10-CM

## 2019-09-21 MED ORDER — AMLODIPINE BESYLATE 10 MG PO TABS: 10.0000 mg | ORAL_TABLET | Freq: Every day | ORAL | 1 refills | Status: DC

## 2019-09-21 MED ORDER — ISOSORBIDE DINITRATE 30 MG PO TABS: 30.0000 mg | ORAL_TABLET | Freq: Three times a day (TID) | ORAL | 2 refills | Status: DC

## 2019-09-21 MED ORDER — HYDRALAZINE HCL 25 MG PO TABS: 25.0000 mg | ORAL_TABLET | Freq: Three times a day (TID) | ORAL | 2 refills | Status: DC

## 2019-09-21 NOTE — Progress Notes (Signed)
Primary Physician/Referring:  Lucianne Lei, MD  Patient ID: Linda Lowe, female    DOB: June 13, 1951, 69 y.o.   MRN: [id number]  Chief Complaint  Patient presents with  . Follow-up    ICD in situ  . Congestive Heart Failure  . Cardiomyopathy   HPI:    Linda Lowe  is a 69 y.o. 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 Jude in 2015 presents to reestablish care.  She was last seen by me in 2018 and stated that she plans to move back to [place].  Since then she has not had any further follow-up including ICD follow-up.  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 She returned to [place] on  08/20/2019. Since then she has had multiple evaluation to the emergency room starting 09/02/2019 presenting with right-sided chest pain and again on 09/16/2019 with history of fall after she lost her balance and again on 09/18/2019 presenting with palpitations.   She has no specific complaints today, but is very dependant on her husband due to hemiparesis. Has had very unsteady gait and falls.   Past Medical History:  Diagnosis Date  . AICD (automatic cardioverter/defibrillator) present    St Jude   . Arthritis   . Cardiomyopathy ([place])    a. 05/2012 Echo: EF 30-35%, Gr 1 DD, mild LVH, mild MR, pericardial effusion  . Diabetes mellitus    a. Dx > 5 y ago  . Dyspnea   . Dysrhythmia   . Hypercholesteremia   . Hypertension   . ICD Biventricular implantable cardioverter-defibrillator (ICD) in situ St. Jude Medical [phone] Quadra Assura 11/26/2013    St Jude   . Pericardial effusion    a. 05/2012 Echo: circumferential pericardial effusion, mostly 25mm, largest pocket posterior - 62mm, mild RA indentation, no RV collapse, no  tamponade.  . Stroke (Linda Lowe) 04/29/2018   Right hemiparesis in [place]   Past Surgical History:  Procedure Laterality Date  . ABDOMINAL HYSTERECTOMY    . BI-VENTRICULAR IMPLANTABLE CARDIOVERTER DEFIBRILLATOR N/A 12/01/2013   St. Jude BI-VENTRICULAR IMPLANTABLE CARDIOVERTER DEFIBRILLATOR  (CRT-D);  Surgeon: Deboraha Sprang, MD;   . FOOT SURGERY    . LEFT AND RIGHT HEART CATHETERIZATION WITH CORONARY ANGIOGRAM  05/26/2012   Procedure: LEFT AND RIGHT HEART CATHETERIZATION WITH CORONARY ANGIOGRAM;  Surgeon: Sherren Mocha, MD;  Location: [facility] CATH LAB;  Service: Cardiovascular;;  . ORIF PATELLA Right 07/08/2017   Procedure: OPEN REDUCTION INTERNAL (ORIF) FIXATION PATELLA;  Surgeon: Renette Butters, MD;  Location: [facility];  Service: Orthopedics;  Laterality: Right;   Social History   Socioeconomic History  . Marital status: Married    Spouse name: 1  . Number of children: Not on file  . Years of education: Not on file  . Highest education level: Not on file  Occupational History  . Not on file  Tobacco Use  . Smoking status: Former Research scientist (life sciences)  . Smokeless tobacco: Never Used  . Tobacco comment: Quit "long time ago."  Substance and Sexual Activity  . Alcohol use: No  . Drug use: No  . Sexual activity: Never  Other Topics Concern  . Not on file  Social History Narrative   Originally from the [place].  She lives in [place] with her husband.    Social Determinants  of Health   Financial Resource Strain:   . Difficulty of Paying Living Expenses: Not on file  Food Insecurity:   . Worried About Programme researcher, broadcasting/film/video in the Last Year: Not on file  . Ran Out of Food in the Last Year: Not on file  Transportation Needs:   . Lack of Transportation (Medical): Not on file  . Lack of Transportation (Non-Medical): Not on file  Physical Activity:   . Days of Exercise per Week: Not on file  . Minutes of Exercise per Session: Not on file  Stress:   . Feeling of Stress : Not on file  Social  Connections:   . Frequency of Communication with Friends and Family: Not on file  . Frequency of Social Gatherings with Friends and Family: Not on file  . Attends Religious Services: Not on file  . Active Member of Clubs or Organizations: Not on file  . Attends Banker Meetings: Not on file  . Marital Status: Not on file  Intimate Partner Violence:   . Fear of Current or Ex-Partner: Not on file  . Emotionally Abused: Not on file  . Physically Abused: Not on file  . Sexually Abused: Not on file   ROS  Review of Systems  Constitution: Negative for chills, decreased appetite, malaise/fatigue and weight gain.  Cardiovascular: Negative for dyspnea on exertion, leg swelling and syncope.  Endocrine: Negative for cold intolerance.  Hematologic/Lymphatic: Does not bruise/bleed easily.  Musculoskeletal: Negative for joint swelling.  Gastrointestinal: Negative for abdominal pain, anorexia, change in bowel habit, hematochezia and melena.  Neurological: Positive for disturbances in coordination (frequent fall) and focal weakness (right hemiparesis). Negative for headaches.  Psychiatric/Behavioral: Negative for depression and substance abuse.  All other systems reviewed and are negative.  Objective  Blood pressure (!) 154/76, pulse 62, temperature (!) 97.4 F (36.3 C), temperature source Oral, resp. rate 18, height 5\' 3"  (1.6 m), weight 140 lb 12.8 oz (63.9 kg), SpO2 99 %.  Vitals with BMI 09/21/2019 09/21/2019 09/18/2019  Height - 5\' 3"  -  Weight - 140 lbs 13 oz -  BMI - 24.95 -  Systolic 154 165 11/16/2019  Diastolic 76 79 78  Pulse 62 60 60     Physical Exam  Constitutional: She is oriented to person, place, and time.  She is moderately built and mildly obese in no acute distress  HENT:  Head: Atraumatic.  Eyes: Conjunctivae are normal.  Neck: No JVD present. No thyromegaly present.  Cardiovascular: Normal rate, regular rhythm, intact distal pulses and normal pulses. Exam reveals no  gallop.  Murmur heard. High-pitched blowing holosystolic murmur is present with a grade of 2/6 at the apex. No leg edema, no JVD.  Pulmonary/Chest: Effort normal and breath sounds normal.  Pacemaker/ICD site noted  in the left infraclavicular fossa.    Abdominal: Soft. Bowel sounds are normal.  Musculoskeletal:     Cervical back: Neck supple.  Neurological: She is alert and oriented to person, place, and time.  Right hemiparesis  Skin: Skin is warm and dry.  Psychiatric: She has a normal mood and affect.   Laboratory examination:   Recent Labs    09/01/19 2328 09/16/19 1003 09/18/19 1114  NA 135 134* 139  K 3.7 4.1 3.6  CL 102 100 105  CO2 25 23 26   GLUCOSE 425* 343* 160*  BUN 11 26* 10  CREATININE 0.58 0.66 0.44  CALCIUM 9.1 9.1 8.9  GFRNONAA >60 >60 >60  GFRAA >60 >60 >  60   estimated creatinine clearance is 61.4 mL/min (by C-G formula based on SCr of 0.44 mg/dL).  CMP Latest Ref Rng & Units 09/18/2019 09/16/2019 09/01/2019  Glucose 70 - 99 mg/dL 378(H) 885(O) 277(A)  BUN 8 - 23 mg/dL 10 12(I) 11  Creatinine 0.44 - 1.00 mg/dL 7.86 7.67 2.09  Sodium 135 - 145 mmol/L 139 134(L) 135  Potassium 3.5 - 5.1 mmol/L 3.6 4.1 3.7  Chloride 98 - 111 mmol/L 105 100 102  CO2 22 - 32 mmol/L 26 23 25   Calcium 8.9 - 10.3 mg/dL 8.9 9.1 9.1  Total Protein 6.5 - 8.1 g/dL - 7.5 7.1  Total Bilirubin 0.3 - 1.2 mg/dL - 0.8 4.7(S)  Alkaline Phos 38 - 126 U/L - 90 84  AST 15 - 41 U/L - 30 18  ALT 0 - 44 U/L - 40 21   CBC Latest Ref Rng & Units 09/18/2019 09/16/2019 09/01/2019  WBC 4.0 - 10.5 K/uL 7.5 9.9 7.1  Hemoglobin 12.0 - 15.0 g/dL 96.2 83.6 62.9  Hematocrit 36.0 - 46.0 % 44.3 43.4 43.1  Platelets 150 - 400 K/uL 207 240 301   Lipid Panel     Component Value Date/Time   CHOL 166 04/10/2017 0857   TRIG 99.0 04/10/2017 0857   HDL 53.60 04/10/2017 0857   CHOLHDL 3 04/10/2017 0857   VLDL 19.8 04/10/2017 0857   LDLCALC 93 04/10/2017 0857   LDLDIRECT 141.0 06/09/2013 0919    HEMOGLOBIN A1C Lab Results  Component Value Date   HGBA1C 9.2 (H) 07/04/2017   MPG 217.34 07/04/2017   TSH No results for input(s): TSH in the last 8760 hours.  Medications and allergies  No Known Allergies   Current Outpatient Medications  Medication Instructions  . acetaminophen (TYLENOL) 500 mg, Oral, Every 6 hours PRN  . amLODipine (NORVASC) 10 mg, Oral, Daily  . aspirin EC 325 mg, Oral, Daily, For 30 days post op for DVT Prophylaxis  . atorvastatin (LIPITOR) 40 MG tablet TAKE 1 TABLET(40 MG) BY MOUTH DAILY  . B-D UF III MINI PEN NEEDLES 31G X 5 MM MISC USE 5 NEEDLES EVERY DAY AS DIRECTED  . baclofen (LIORESAL) 10 mg, Oral, 3 times daily PRN  . carvedilol (COREG) 25 mg, Oral, 2 times daily  . docusate sodium (COLACE) 100 mg, Oral, 2 times daily, To prevent constipation while taking pain medication.  Marland Kitchen EPINEPHrine (EPIPEN) 0.3 mg, Intramuscular, As needed, For allergic reactions  . furosemide (LASIX) 20 mg, Oral, 2 times daily  . glucose blood (CONTOUR NEXT TEST) test strip Use as instructed  . glucose blood (ONETOUCH VERIO) test strip USE TO TEST BLOOD SUGAR 3 TIMES DAILY  . hydrALAZINE (APRESOLINE) 25 mg, Oral, 3 times daily  . insulin aspart (NOVOLOG FLEXPEN) 100 UNIT/ML FlexPen ADMINISTER 16 UNITS UNDER THE SKIN TWICE DAILY BEFORE LUNCH AND SUPPER  . Insulin Pen Needle (B-D UF III MINI PEN NEEDLES) 31G X 5 MM MISC USE 5 PEN NEEDLES PER DAY AS INSTRUCTED  . Insulin Pen Needle (B-D UF III MINI PEN NEEDLES) 31G X 5 MM MISC USE 5 NEEDLES EVERY DAY AS DIRECTED  . INVOKAMET [phone] MG TABS TAKE 1 TABLET BY MOUTH TWICE DAILY  . irbesartan (AVAPRO) 300 mg, Oral, Daily  . isosorbide dinitrate (ISORDIL) 30 mg, Oral, 3 times daily  . liraglutide (VICTOZA) 18 MG/3ML SOPN ADMINISTER 1.8 MG UNDER THE SKIN DAILY  . naproxen (NAPROSYN) 500 mg, Oral, 2 times daily PRN  . NOVOLOG FLEXPEN 100 UNIT/ML FlexPen ADMINISTER  16 UNITS UNDER THE SKIN TWICE DAILY BEFORE LUNCH AND SUPPER  .  omeprazole (PRILOSEC) 20 mg, Oral, Daily, 30 days for gastroprotection while taking Aspirin.  . ondansetron (ZOFRAN) 4 mg, Oral, Every 8 hours PRN  . ONETOUCH DELICA LANCETS 33G MISC USE TO TEST BLOOD SUGAR LEVELS THREE TIMES DAILY  . oxybutynin (DITROPAN-XL) 5 mg, Oral, Daily  . oxyCODONE-acetaminophen (ROXICET) 5-325 MG tablet 1-2 tablets, Oral, Every 4 hours PRN  . potassium chloride SA (K-DUR,KLOR-CON) 20 MEQ tablet TAKE 1 TABLET(20 MEQ) BY MOUTH DAILY  . Toujeo SoloStar 40 Units, Subcutaneous, Daily  . VICTOZA 18 MG/3ML SOPN INJECT 1.8 MG UNDER THE SKIN DAILY    Radiology:   CT Head 09/16/2019: No evidence of acute intracranial injury. 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  Cardiac Studies:   Coronary Angiogram   [22-Jun-2012]: No significant coronary artery disease. Ejection fraction 35%. Mild pulmonary hypertension.  Echocardiogram 03/22/2017: - Left ventricle: The cavity size was normal. Wall thickness was   increased in a pattern of moderate LVH. There was moderate   concentric hypertrophy. Systolic function was normal. The  estimated ejection fraction was in the range of 55% to 60%.  Doppler parameters are consistent with abnormal left ventricular  relaxation (grade 1 diastolic dysfunction). - Left atrium: The atrium was mildly dilated.  Vascular ultrasound right upper extremity venous duplex 09/02/2019: Right Upper extremity:  No evidence of deep vein thrombosis in the upper extremity. No evidence of superficial vein thrombosis in the upper extremity.  There is a heterogenous area of the medial right upper arm that exhibits internal low resistant vascularity.This is suggestive of possible malignancy versus unknown etiology. Ultrasound characteristics of prominent lymph nodes seen in the right neck in the area of the internal jugular vein.  CTA Chest 09/02/2019: 1. No pulmonary embolus.  No acute intrathoracic abnormality. 2. Dilatation of the main pulmonary  artery, can be seen with pulmonary arterial hypertension.  Assessment     ICD-10-CM   1. Chronic systolic heart failure (HCC)  G40.10 PCV ECHOCARDIOGRAM COMPLETE  2. Nonischemic cardiomyopathy (HCC)  I42.8   3. Essential hypertension  I10 amLODipine (NORVASC) 10 MG tablet    isosorbide dinitrate (ISORDIL) 30 MG tablet    hydrALAZINE (APRESOLINE) 25 MG tablet    PCV ECHOCARDIOGRAM COMPLETE    TSH    Magnesium  4. LBBB (left bundle branch block)  I44.7   5. ICD Biventricular implantable cardioverter-defibrillator (ICD) in situ St. Jude Medical [phone] Quadra Assura 11/26/2013  Z95.810   6. Pure hypercholesterolemia  E78.00 Lipid Panel With LDL/HDL Ratio    Bi-V ICD remote transmission 04/08/2017: A paced >10%, bi-V paced >80%(Bi-V paced less than threshold). Normal thresholds. No high rates. No therapy. No CHF. Life > 3 years. (CHF between last transmission on 03/10/17 and 04/08/17)  EKG 09/18/2019: AV paced rhythm at rate of 62 bpm, biventricular pacemaker no further analysis.  Recommendations:   Meds ordered this encounter  Medications  . amLODipine (NORVASC) 10 MG tablet    Sig: Take 1 tablet (10 mg total) by mouth daily.    Dispense:  90 tablet    Refill:  1  . isosorbide dinitrate (ISORDIL) 30 MG tablet    Sig: Take 1 tablet (30 mg total) by mouth 3 (three) times daily.    Dispense:  90 tablet    Refill:  2  . hydrALAZINE (APRESOLINE) 25 MG tablet    Sig: Take 1 tablet (25 mg total) by mouth 3 (three) times daily.  Dispense:  90 tablet    Refill:  2    Estel L Fillinger  is a 69 y.o. 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 Jude in 2015 presents to reestablish care.  She was last seen by me in 2018 and stated that she plans to move back to [place]  Since then she has not had  any further follow-up including ICD follow-up.  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 She returned to [place] on  08/20/2019. Since then she has had multiple evaluation to the emergency room (3).  Have increased amlodipine from 5 mg to 10 mg in view of uncontrolled hypertension, added hydralazine and isosorbide dinitrate also for hypertension control.  Fortunately she does not appear to be in acute decompensated heart failure.  She has significant right-sided deficit, she may benefit from physical therapy.  She is already reestablished herself with Dr. Parke Simmers and I will forward her this letter.  I ordered labs including lipid profile testing for my records.  I'd like to see her back in 4-6 weeks, she will need ICD monitoring both remote and in person.  Need to specifically look for atrial fibrillation but in view of uncontrolled diabetes, hypertension, she probably had atherosclerotic CVA with residual right-sided hemiparesis. I will also repeat echocardiogram due to new murmur and to f/u on LVEF.  Fortunately EKG is still narrow complex from BV pacing.   [name], MD, [facility] 09/21/2019, 4:09 PM [facility] [place] Pager: [phone] Office: [phone]

## 2019-09-23 ENCOUNTER — Encounter (HOSPITAL_COMMUNITY): Payer: Self-pay | Admitting: Emergency Medicine

## 2019-09-23 ENCOUNTER — Other Ambulatory Visit: Payer: Self-pay

## 2019-09-23 ENCOUNTER — Emergency Department (HOSPITAL_COMMUNITY): Payer: Medicare Other

## 2019-09-23 ENCOUNTER — Emergency Department (HOSPITAL_COMMUNITY)
Admission: EM | Admit: 2019-09-23 | Discharge: 2019-09-23 | Disposition: A | Discharge status: Home / Self Care | Payer: Medicare Other | Attending: Emergency Medicine | Admitting: Emergency Medicine

## 2019-09-23 DIAGNOSIS — E119 Type 2 diabetes mellitus without complications: Secondary | ICD-10-CM | POA: Insufficient documentation

## 2019-09-23 DIAGNOSIS — Z794 Long term (current) use of insulin: Secondary | ICD-10-CM | POA: Insufficient documentation

## 2019-09-23 DIAGNOSIS — Z95810 Presence of automatic (implantable) cardiac defibrillator: Secondary | ICD-10-CM | POA: Insufficient documentation

## 2019-09-23 DIAGNOSIS — Z79899 Other long term (current) drug therapy: Secondary | ICD-10-CM | POA: Diagnosis not present

## 2019-09-23 DIAGNOSIS — I10 Essential (primary) hypertension: Secondary | ICD-10-CM | POA: Diagnosis not present

## 2019-09-23 DIAGNOSIS — M62838 Other muscle spasm: Secondary | ICD-10-CM | POA: Diagnosis not present

## 2019-09-23 DIAGNOSIS — Z7982 Long term (current) use of aspirin: Secondary | ICD-10-CM | POA: Diagnosis not present

## 2019-09-23 DIAGNOSIS — R0789 Other chest pain: Secondary | ICD-10-CM | POA: Diagnosis present

## 2019-09-23 DIAGNOSIS — Z87891 Personal history of nicotine dependence: Secondary | ICD-10-CM | POA: Diagnosis not present

## 2019-09-23 LAB — BASIC METABOLIC PANEL
Anion gap: 8 (ref 5–15)
BUN: 19 mg/dL (ref 8–23)
CO2: 26 mmol/L (ref 22–32)
Calcium: 8.8 mg/dL — ABNORMAL LOW (ref 8.9–10.3)
Chloride: 106 mmol/L (ref 98–111)
Creatinine, Ser: 0.56 mg/dL (ref 0.44–1.00)
GFR calc Af Amer: >60 mL/min (ref >60)
GFR calc non Af Amer: >60 mL/min (ref >60)
Glucose, Bld: 137 mg/dL — ABNORMAL HIGH (ref 70–99)
Potassium: 3.7 mmol/L (ref 3.5–5.1)
Sodium: 140 mmol/L (ref 135–145)

## 2019-09-23 LAB — CBC
HCT: 40.5 % (ref 36.0–46.0)
Hemoglobin: 13.6 g/dL (ref 12.0–15.0)
MCH: 29.4 pg (ref 26.0–34.0)
MCHC: 33.6 g/dL (ref 30.0–36.0)
MCV: 87.5 fL (ref 80.0–100.0)
Platelets: 213 10*3/uL (ref 150–400)
RBC: 4.63 MIL/uL (ref 3.87–5.11)
RDW: 12.7 % (ref 11.5–15.5)
WBC: 7.1 10*3/uL (ref 4.0–10.5)
nRBC: 0 % (ref 0.0–0.2)

## 2019-09-23 LAB — TROPONIN I (HIGH SENSITIVITY)
Troponin I (High Sensitivity): 17 ng/L (ref <18)
Troponin I (High Sensitivity): 13 ng/L (ref <18)

## 2019-09-23 LAB — PROTIME-INR
INR: 1 (ref 0.8–1.2)
Prothrombin Time: 12.7 seconds (ref 11.4–15.2)

## 2019-09-23 MED ORDER — LIDOCAINE 5 % EX PTCH: 1.0000 | MEDICATED_PATCH | CUTANEOUS | 0 refills | Status: DC

## 2019-09-23 MED ORDER — SODIUM CHLORIDE 0.9% FLUSH: 3.0000 mL | Freq: Once | INTRAVENOUS | Status: DC

## 2019-09-23 MED ORDER — LIDOCAINE 5 % EX PTCH
1.0000 | MEDICATED_PATCH | CUTANEOUS | Status: DC
  Administered 2019-09-23: 1 via TRANSDERMAL
  Filled 2019-09-23: qty 1

## 2019-09-23 NOTE — ED Provider Notes (Signed)
[facility] EMERGENCY DEPARTMENT Provider Note   CSN: [id number] Arrival date & time: 09/23/19  7353     History Chief Complaint  Patient presents with  . Chest Pain    Linda Lowe is a 69 y.o. female.  69yo female with past medical history of CVA, DM, cardiomyopathy with AICD. Patient reports right side neck/shoulder/upper chest pain onset this morning upon waking. Pain in the neck has improved, continues to have pain in her right shoulder and upper chest area.  Pain is worse with movement of her right arm, movement of the right arm is limited secondary to weakness from her stroke.  Denies shortness of breath, left-sided chest pain, falls, injuries.  Patient reports starting physical therapy 2 days ago.  No other complaints or concerns.        Past Medical History:  Diagnosis Date  . AICD (automatic cardioverter/defibrillator) present    St Jude   . Arthritis   . Cardiomyopathy ([place])    a. 05/2012 Echo: EF 30-35%, Gr 1 DD, mild LVH, mild MR, pericardial effusion  . Diabetes mellitus    a. Dx > 5 y ago  . Dyspnea   . Dysrhythmia   . Hypercholesteremia   . Hypertension   . ICD Biventricular implantable cardioverter-defibrillator (ICD) in situ [employer] [phone] Quadra Assura 11/26/2013    St Jude   . Pericardial effusion    a. 05/2012 Echo: circumferential pericardial effusion, mostly 32mm, largest pocket posterior - 10mm, mild RA indentation, no RV collapse, no tamponade.  . Stroke ([place]) 04/29/2018   Right hemiparesis in [place]    Patient Active Problem List   Diagnosis Date Noted  . ICD Biventricular implantable cardioverter-defibrillator (ICD) in situ [employer] [phone] Quadra Assura 11/26/2013   . Right patella fracture 07/08/2017  . Hyponatremia 03/22/2017  . SIRS (systemic inflammatory response syndrome) ([place]) 03/22/2017  . Diarrhea 03/22/2017  . Elevated troponin 03/22/2017  . Uncontrolled type 2 diabetes mellitus with  hyperglycemia, with long-term current use of insulin ([place]) 09/28/2016  . Nonischemic cardiomyopathy ([place]) 08/26/2013  . Chronic systolic heart failure ([place]) 08/26/2013  . LBBB (left bundle branch block) 08/26/2013  . Hyperlipidemia 06/23/2013  . Essential hypertension 05/23/2012    Past Surgical History:  Procedure Laterality Date  . ABDOMINAL HYSTERECTOMY    . BI-VENTRICULAR IMPLANTABLE CARDIOVERTER DEFIBRILLATOR N/A 12/01/2013   St. Jude BI-VENTRICULAR IMPLANTABLE CARDIOVERTER DEFIBRILLATOR  (CRT-D);  Surgeon: Deboraha Sprang, MD;   . FOOT SURGERY    . LEFT AND RIGHT HEART CATHETERIZATION WITH CORONARY ANGIOGRAM  05/26/2012   Procedure: LEFT AND RIGHT HEART CATHETERIZATION WITH CORONARY ANGIOGRAM;  Surgeon: Sherren Mocha, MD;  Location: [facility] CATH LAB;  Service: Cardiovascular;;  . ORIF PATELLA Right 07/08/2017   Procedure: OPEN REDUCTION INTERNAL (ORIF) FIXATION PATELLA;  Surgeon: Renette Butters, MD;  Location: [facility];  Service: Orthopedics;  Laterality: Right;     OB History   No obstetric history on file.     Family History  Problem Relation Age of Onset  . Other Other        No history of CAD  . Tuberculosis Father        died when pt was young.    Social History   Tobacco Use  . Smoking status: Former Research scientist (life sciences)  . Smokeless tobacco: Never Used  . Tobacco comment: Quit "long time ago."  Substance Use Topics  . Alcohol use: No  . Drug use: No    Home Medications  Prior to Admission medications   Medication Sig Start Date End Date Taking? Authorizing Provider  acetaminophen (TYLENOL) 500 MG tablet Take 1 tablet (500 mg total) by mouth every 6 (six) hours as needed. 09/02/19   Fawze, Mina A, PA-C  amLODipine (NORVASC) 10 MG tablet Take 1 tablet (10 mg total) by mouth daily. 09/21/19   [name], MD  aspirin EC 325 MG tablet Take 1 tablet (325 mg total) by mouth daily. For 30 days post op for DVT Prophylaxis 07/08/17   Albina Billet III, PA-C  atorvastatin  (LIPITOR) 40 MG tablet TAKE 1 TABLET(40 MG) BY MOUTH DAILY 12/02/17   Reather Littler, MD  B-D UF III MINI PEN NEEDLES 31G X 5 MM MISC USE 5 NEEDLES EVERY DAY AS DIRECTED 09/12/17   Reather Littler, MD  baclofen (LIORESAL) 10 MG tablet Take 1 tablet (10 mg total) by mouth 3 (three) times daily as needed for muscle spasms. 07/08/17   Albina Billet III, PA-C  carvedilol (COREG) 25 MG tablet Take 25 mg by mouth 2 (two) times daily. 07/15/15   [provider]  docusate sodium (COLACE) 100 MG capsule Take 1 capsule (100 mg total) by mouth 2 (two) times daily. To prevent constipation while taking pain medication. 07/08/17   Martensen, Lucretia Kern III, PA-C  EPINEPHrine (EPIPEN) 0.3 mg/0.3 mL DEVI Inject 0.3 mg into the muscle as needed (for allergic reaction). For allergic reactions     [provider]  furosemide (LASIX) 20 MG tablet Take 20 mg by mouth 2 (two) times daily.     [provider]  glucose blood (CONTOUR NEXT TEST) test strip Use as instructed 10/16/17   Reather Littler, MD  glucose blood (ONETOUCH VERIO) test strip USE TO TEST BLOOD SUGAR 3 TIMES DAILY 11/04/17   Reather Littler, MD  hydrALAZINE (APRESOLINE) 25 MG tablet Take 1 tablet (25 mg total) by mouth 3 (three) times daily. 09/21/19 12/20/19  [name], MD  insulin aspart (NOVOLOG FLEXPEN) 100 UNIT/ML FlexPen ADMINISTER 16 UNITS UNDER THE SKIN TWICE DAILY BEFORE LUNCH AND SUPPER 04/17/17   Reather Littler, MD  Insulin Pen Needle (B-D UF III MINI PEN NEEDLES) 31G X 5 MM MISC USE 5 PEN NEEDLES PER DAY AS INSTRUCTED 10/14/16   Reather Littler, MD  Insulin Pen Needle (B-D UF III MINI PEN NEEDLES) 31G X 5 MM MISC USE 5 NEEDLES EVERY DAY AS DIRECTED 09/12/17   Reather Littler, MD  INVOKAMET [phone] MG TABS TAKE 1 TABLET BY MOUTH TWICE DAILY 10/03/17   Reather Littler, MD  irbesartan (AVAPRO) 300 MG tablet Take 300 mg by mouth daily.    [provider]  isosorbide dinitrate (ISORDIL) 30 MG tablet Take 1 tablet (30 mg total) by mouth 3  (three) times daily. 09/21/19   [name], MD  lidocaine (LIDODERM) 5 % Place 1 patch onto the skin daily. Remove & Discard patch within 12 hours or as directed by MD 09/23/19   Jeannie Fend, PA-C  naproxen (NAPROSYN) 500 MG tablet Take 500 mg by mouth 2 (two) times daily as needed for mild pain.     [provider]  NOVOLOG FLEXPEN 100 UNIT/ML FlexPen ADMINISTER 16 UNITS UNDER THE SKIN TWICE DAILY BEFORE LUNCH AND SUPPER 07/24/17   Reather Littler, MD  omeprazole (PRILOSEC) 20 MG capsule Take 1 capsule (20 mg total) by mouth daily. 30 days for gastroprotection while taking Aspirin. 07/08/17   Albina Billet III, PA-C  ondansetron (ZOFRAN) 4 MG tablet  Take 1 tablet (4 mg total) by mouth every 8 (eight) hours as needed for nausea or vomiting. 07/08/17   Martensen, Lucretia Kern III, PA-C  ONETOUCH DELICA LANCETS 33G MISC USE TO TEST BLOOD SUGAR LEVELS THREE TIMES DAILY 09/17/17   Reather Littler, MD  oxybutynin (DITROPAN-XL) 5 MG 24 hr tablet Take 5 mg by mouth daily.    [provider]  oxyCODONE-acetaminophen (ROXICET) 5-325 MG tablet Take 1-2 tablets by mouth every 4 (four) hours as needed for severe pain. 07/08/17   Martensen, Lucretia Kern III, PA-C  potassium chloride SA (K-DUR,KLOR-CON) 20 MEQ tablet TAKE 1 TABLET(20 MEQ) BY MOUTH DAILY 12/01/16   Reather Littler, MD  TOUJEO SOLOSTAR 300 UNIT/ML SOPN INJECT 40 UNITS INTO THE SKIN DAILY Patient taking differently: Inject 42 Units into the skin daily.  12/30/16   Reather Littler, MD  VICTOZA 18 MG/3ML SOPN INJECT 1.8 MG UNDER THE SKIN DAILY 09/17/17   Reather Littler, MD    Allergies    Patient has no known allergies.  Review of Systems   Review of Systems  Constitutional: Negative for fever.  Respiratory: Negative for shortness of breath.   Cardiovascular: Positive for chest pain.  Gastrointestinal: Negative for abdominal pain, nausea and vomiting.  Musculoskeletal: Positive for arthralgias, myalgias and neck pain.  Skin: Negative for  rash and wound.  Allergic/Immunologic: Positive for immunocompromised state.  Neurological: Negative for headaches.       Denies new weakness or speech difficulty.  All other systems reviewed and are negative.   Physical Exam Updated Vital Signs BP (!) 141/74 (BP Location: Right Arm)   Pulse 60   Temp 98.6 F (37 C) (Oral)   Resp 14   SpO2 98%   Physical Exam Vitals and nursing note reviewed.  Constitutional:      Appearance: She is not ill-appearing.  HENT:     Head: Normocephalic and atraumatic.  Eyes:     Extraocular Movements: Extraocular movements intact.     Pupils: Pupils are equal, round, and reactive to light.  Cardiovascular:     Rate and Rhythm: Normal rate and regular rhythm.     Heart sounds: Normal heart sounds.  Pulmonary:     Effort: Pulmonary effort is normal.     Breath sounds: Normal breath sounds. No decreased breath sounds.  Chest:     Chest wall: Tenderness present. No deformity or crepitus.    Abdominal:     Palpations: Abdomen is soft.     Tenderness: There is no abdominal tenderness.  Musculoskeletal:       Arms:     Right lower leg: No tenderness. No edema.     Left lower leg: No tenderness. No edema.     Comments: Weakness to right arm and leg, limited movement upper and lower extremities, pulses normal  Skin:    General: Skin is warm and dry.     Findings: No erythema or rash.  Neurological:     Mental Status: She is alert and oriented to person, place, and time.  Psychiatric:        Behavior: Behavior normal.     ED Results / Procedures / Treatments   Labs (all labs ordered are listed, but only abnormal results are displayed) Labs Reviewed  BASIC METABOLIC PANEL - Abnormal; Notable for the following components:      Result Value   Glucose, Bld 137 (*)    Calcium 8.8 (*)    All other components within normal limits  CBC  PROTIME-INR  TROPONIN I (HIGH SENSITIVITY)  TROPONIN I (HIGH SENSITIVITY)     EKG None  Radiology DG Chest 2 View  Result Date: 09/23/2019 CLINICAL DATA:  Chest pain. EXAM: CHEST - 2 VIEW COMPARISON:  09/18/2019. FINDINGS: AICD in stable position. Stable cardiomegaly. Left base subsegmental atelectasis. No pleural effusion or pneumothorax. No acute bony abnormality. IMPRESSION: 1. AICD in stable position. Stable cardiomegaly. No pulmonary venous congestion. 2.  Mild left base subsegmental atelectasis. Electronically Signed   By: Maisie Fus  Register   On: 09/23/2019 07:42    Procedures Procedures (including critical care time)  Medications Ordered in ED Medications  sodium chloride flush (NS) 0.9 % injection 3 mL (has no administration in time range)  lidocaine (LIDODERM) 5 % 1 patch (1 patch Transdermal Patch Applied 09/23/19 1004)    ED Course  I have reviewed the triage vital signs and the nursing notes.  Pertinent labs & imaging results that were available during my care of the patient were reviewed by me and considered in my medical decision making (see chart for details).  Clinical Course as of Sep 23 1347  Thu Sep 23, 2019  1058 68yo female with complaint of pain in the right upper chest and right trapezius area. Pain clearly reproduced with palpation of these areas. Visible spasm to right trapezius suspect secondary to right side weakness from prior CVA and recently starting PT. No associated symptoms. Patient was given a lidoderm patch, pain has improved. CBC and BMP without significant findings, troponin 17 initially in this patient without complaint of chest pain, 13 on repeat. Do not suspect cardiac origin of pain today. Recommend lidoderm patches, warm compresses, gentle stretching/massage and follow up with PCP.  Prior records including recent imaging reviewed, case discussed with Dr. Juleen China, ER attending, agrees with plan of care.   [LM]    Clinical Course User Index [LM] Alden Hipp   MDM Rules/Calculators/A&P                       Final Clinical Impression(s) / ED Diagnoses Final diagnoses:  Muscle spasm of right shoulder    Rx / DC Orders ED Discharge Orders         Ordered    lidocaine (LIDODERM) 5 %  Every 24 hours     09/23/19 1049           Alden Hipp 09/23/19 1349    Raeford Razor, MD 09/24/19 1011

## 2019-09-23 NOTE — ED Triage Notes (Signed)
Patient woke up this morning with right chest pain radiating to right lateral neck and and right shoulder with SOB and emesis , denies cough or fever .

## 2019-09-23 NOTE — ED Notes (Signed)
[place] sandwich given to eat and husband called to come and pick her up.

## 2019-09-23 NOTE — Discharge Instructions (Addendum)
Apply Lidoderm patch to your right shoulder for pain as needed. You may put a heating pad on this area for 20 minutes at a time followed by gentle stretching or massage. Recheck with your primary care provider, return to ER for new or worsening symptoms.

## 2019-10-08 ENCOUNTER — Other Ambulatory Visit: Payer: Medicare Other

## 2019-10-12 ENCOUNTER — Other Ambulatory Visit: Payer: Self-pay

## 2019-10-12 ENCOUNTER — Ambulatory Visit (INDEPENDENT_AMBULATORY_CARE_PROVIDER_SITE_OTHER): Payer: Medicare Other

## 2019-10-12 DIAGNOSIS — I5022 Chronic systolic (congestive) heart failure: Secondary | ICD-10-CM | POA: Diagnosis not present

## 2019-10-12 DIAGNOSIS — I10 Essential (primary) hypertension: Secondary | ICD-10-CM | POA: Diagnosis not present

## 2019-10-13 ENCOUNTER — Encounter: Payer: Self-pay | Admitting: Cardiology

## 2019-10-13 ENCOUNTER — Ambulatory Visit (INDEPENDENT_AMBULATORY_CARE_PROVIDER_SITE_OTHER): Payer: Medicare Other | Admitting: Cardiology

## 2019-10-13 DIAGNOSIS — Z4502 Encounter for adjustment and management of automatic implantable cardiac defibrillator: Secondary | ICD-10-CM

## 2019-10-13 DIAGNOSIS — I5022 Chronic systolic (congestive) heart failure: Secondary | ICD-10-CM | POA: Diagnosis not present

## 2019-10-13 DIAGNOSIS — Z95810 Presence of automatic (implantable) cardiac defibrillator: Secondary | ICD-10-CM | POA: Diagnosis not present

## 2019-10-13 DIAGNOSIS — I428 Other cardiomyopathies: Secondary | ICD-10-CM | POA: Diagnosis not present

## 2019-10-13 NOTE — Progress Notes (Signed)
Scheduled  In office ICD 10/13/19  Single (S)/Dual (D)/BV (M) BV Presenting AP/AS BP Pacer dependant: No Underlying NSR. AP 47%, BP 92.% PVC burden 5% AMS Episodes 60 (Noise).  AT/AF burden 0%. Longest AT 6 seconds. HVR 0.   Noise reversions noted but EGM turned off while in [place]. Turned on noise trigger.  Longevity 1.8 Years/Voltage. Charge time [employer]. Lead measurements: Stable Histogram: Low (L)/normal (N)/high (H)  N Patient activity low. Thoracic impedance: Baseline.  Observations: Normal ICD function.  Changes: None    ICD-10-CM   1. Nonischemic cardiomyopathy (HCC)  I42.8   2. Encounter for management of biventricular implantable cardioverter-defibrillator (ICD)  Z45.02   3. ICD Biventricular implantable cardioverter-defibrillator (ICD) in situ St. Jude Medical [phone] Dillon Bjork 11/26/2013  Z95.810      I have reviewed the data with the patient and her husband. ICD function is normal. Artifact noted, no A, Fib.

## 2019-11-05 ENCOUNTER — Ambulatory Visit: Payer: TRICARE For Life (TFL) | Admitting: Cardiology

## 2019-11-12 ENCOUNTER — Ambulatory Visit: Payer: Medicare Other | Attending: Internal Medicine

## 2019-11-12 DIAGNOSIS — Z23 Encounter for immunization: Secondary | ICD-10-CM | POA: Insufficient documentation

## 2019-11-12 NOTE — Progress Notes (Signed)
   Covid-19 Vaccination Clinic  Name:  [name]    MRN: [id number] DOB: Sep 11, 1951  11/12/2019  [name] was observed post Covid-19 immunization for 15 minutes without incidence. She was provided with Vaccine Information Sheet and instruction to access the V-Safe system.   [name] was instructed to call 911 with any severe reactions post vaccine: Marland Kitchen Difficulty breathing  . Swelling of your face and throat  . A fast heartbeat  . A bad rash all over your body  . Dizziness and weakness    Immunizations Administered    Name Date Dose VIS Date Route   Pfizer COVID-19 Vaccine 11/12/2019  4:23 PM 0.3 mL 08/27/2019 Intramuscular   Manufacturer: [employer], [employer]   Lot: [id number]   NDC: [address]-5

## 2019-11-30 ENCOUNTER — Telehealth: Payer: Self-pay

## 2019-11-30 NOTE — Telephone Encounter (Signed)
Called and spoke with patient regarding her ICD check results.

## 2019-11-30 NOTE — Telephone Encounter (Signed)
-----   Message from [name], MD sent at 11/21/2019  2:02 PM EST ----- Regarding: ICD Scheduled Remote ICD check  11/15/2019: There were 0 atrial high rate episodes detected since 10/13/19. There were 0 VF episodes, 0 VT-2 episodes, 0 VT-1 episodes and 0 nonsustained episodes of ventricular tachycardia since 10/13/19. Health trends are stable. Corvue impedance monitoring does not suggest recent fluid accumulation. Battery longevity is 1.9 years. RA pacing is 56.0 %, RV pacing is 93.0 %, and LV pacing is 93.0 %.  NOrmal function. NO abnormality noted.  JG

## 2019-12-08 ENCOUNTER — Ambulatory Visit: Payer: Medicare Other | Attending: Internal Medicine

## 2019-12-08 DIAGNOSIS — Z23 Encounter for immunization: Secondary | ICD-10-CM

## 2019-12-08 NOTE — Progress Notes (Signed)
   Covid-19 Vaccination Clinic  Name:  Linda Lowe    MRN: [id number] DOB: [birth year]  12/08/2019  [name] was observed post Covid-19 immunization for 15 minutes without incident. She was provided with Vaccine Information Sheet and instruction to access the V-Safe system.   [name] was instructed to call 911 with any severe reactions post vaccine: Marland Kitchen Difficulty breathing  . Swelling of face and throat  . A fast heartbeat  . A bad rash all over body  . Dizziness and weakness   Immunizations Administered    Name Date Dose VIS Date Route   Pfizer COVID-19 Vaccine 12/08/2019  3:40 PM 0.3 mL 08/27/2019 Intramuscular   Manufacturer: [employer], [employer]   Lot: [id number]   NDC: [address]-1

## 2019-12-18 ENCOUNTER — Other Ambulatory Visit: Payer: Self-pay | Admitting: Cardiology

## 2019-12-18 DIAGNOSIS — I10 Essential (primary) hypertension: Secondary | ICD-10-CM

## 2019-12-19 ENCOUNTER — Other Ambulatory Visit: Payer: Self-pay | Admitting: Cardiology

## 2019-12-19 DIAGNOSIS — I10 Essential (primary) hypertension: Secondary | ICD-10-CM

## 2019-12-20 ENCOUNTER — Other Ambulatory Visit: Payer: Self-pay | Admitting: Cardiology

## 2019-12-20 DIAGNOSIS — I10 Essential (primary) hypertension: Secondary | ICD-10-CM

## 2019-12-20 NOTE — Telephone Encounter (Signed)
Patient has an appt in on week. Will refill at this time.

## 2019-12-21 LAB — LIPID PANEL WITH LDL/HDL RATIO
Cholesterol, Total: 157 mg/dL (ref 100–199)
HDL: 59 mg/dL (ref >39)
LDL Chol Calc (NIH): 83 mg/dL (ref 0–99)
LDL/HDL Ratio: 1.4 ratio (ref 0.0–3.2)
Triglycerides: 80 mg/dL (ref 0–149)
VLDL Cholesterol Cal: 15 mg/dL (ref 5–40)

## 2019-12-21 LAB — TSH: TSH: 2.11 u[IU]/mL (ref 0.450–4.500)

## 2019-12-21 LAB — MAGNESIUM: Magnesium: 2.1 mg/dL (ref 1.6–2.3)

## 2019-12-21 NOTE — Progress Notes (Signed)
Normal lipids and TSH. Discuss on OV

## 2019-12-22 ENCOUNTER — Other Ambulatory Visit: Payer: Self-pay

## 2019-12-22 ENCOUNTER — Telehealth: Payer: Self-pay

## 2019-12-22 ENCOUNTER — Other Ambulatory Visit: Payer: Self-pay | Admitting: Cardiology

## 2019-12-22 DIAGNOSIS — I10 Essential (primary) hypertension: Secondary | ICD-10-CM

## 2019-12-22 MED ORDER — ISOSORBIDE DINITRATE 30 MG PO TABS: ORAL_TABLET | ORAL | 0 refills | Status: DC

## 2019-12-22 NOTE — Telephone Encounter (Signed)
Error

## 2019-12-26 ENCOUNTER — Telehealth: Payer: Self-pay | Admitting: Cardiology

## 2019-12-26 NOTE — Telephone Encounter (Signed)
Scheduled Remote ICD check  12/20/2019: There were 0 atrial high rate episodes detected since 10/13/19.  0 nonsustained episodes of ventricular tachycardia since 10/13/19. Health trends are stable. Corvue impedance monitoring does not suggest recent fluid accumulation. Battery longevity is 1.6 years. RA pacing is 64.0 %, RV pacing is 93.0 %, and LV pacing is 93.0 %.  Normal ICD function. No heart failure signs.  JG

## 2019-12-27 ENCOUNTER — Encounter: Payer: Self-pay | Admitting: Cardiology

## 2019-12-27 ENCOUNTER — Other Ambulatory Visit: Payer: Self-pay | Admitting: Cardiology

## 2019-12-27 ENCOUNTER — Other Ambulatory Visit: Payer: Self-pay

## 2019-12-27 ENCOUNTER — Ambulatory Visit: Payer: Medicare Other | Admitting: Cardiology

## 2019-12-27 VITALS — BP 97/58 | HR 71 | Temp 97.4°F | Resp 16 | Ht 63.0 in | Wt 152.0 lb

## 2019-12-27 DIAGNOSIS — I428 Other cardiomyopathies: Secondary | ICD-10-CM

## 2019-12-27 DIAGNOSIS — Z95810 Presence of automatic (implantable) cardiac defibrillator: Secondary | ICD-10-CM

## 2019-12-27 DIAGNOSIS — I5022 Chronic systolic (congestive) heart failure: Secondary | ICD-10-CM

## 2019-12-27 DIAGNOSIS — E7800 Pure hypercholesterolemia, unspecified: Secondary | ICD-10-CM

## 2019-12-27 DIAGNOSIS — I10 Essential (primary) hypertension: Secondary | ICD-10-CM

## 2019-12-27 MED ORDER — ATORVASTATIN CALCIUM 80 MG PO TABS: 80.0000 mg | ORAL_TABLET | Freq: Every day | ORAL | 1 refills | Status: DC

## 2019-12-27 NOTE — Progress Notes (Signed)
Primary Physician/Referring:  Renaye Rakers, MD  Patient ID: Linda Lowe, female    DOB: 1951/07/07, 69 y.o.   MRN: [id number]  Chief Complaint  Patient presents with  . Hypertension  . Follow-up    6 week  . ICD check  . Congestive Heart Failure   HPI:    Linda Lowe  is a 69 y.o. 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 She returned to [place] on  08/20/2019. Since then she has had multiple evaluation to the emergency room (3).  She reestablished with me in January 2021, fortunately has not had any reevaluation in the emergency room.  On her last office visit I had started her on amlodipine and also hydralazine and isosorbide dinitrate for hypertension and heart failure.  She is tolerating this.  She has no specific complaints today, but is very dependant on her husband due to hemiparesis. Has had very unsteady gait and falls.   Past Medical History:  Diagnosis Date  . AICD (automatic cardioverter/defibrillator) present    St Jude   . Arthritis   . Cardiomyopathy (HCC)    a. 05/2012 Echo: EF 30-35%, Gr 1 DD, mild LVH, mild MR, pericardial effusion  . Chronic systolic heart failure (HCC) 08/26/2013  . Diabetes mellitus    a. Dx > 5 y ago  . Dyspnea   . Dysrhythmia   . Hypercholesteremia   . Hypertension   . ICD Biventricular implantable cardioverter-defibrillator (ICD) in situ [employer] [phone] Quadra Assura 11/26/2013    St Jude   . Pericardial effusion    a. 05/2012 Echo: circumferential pericardial effusion, mostly 13mm, largest pocket posterior - 65mm, mild RA indentation, no RV collapse, no tamponade.  . Stroke (HCC) 04/29/2018    Right hemiparesis in [place]   Past Surgical History:  Procedure Laterality Date  . ABDOMINAL HYSTERECTOMY    . BI-VENTRICULAR IMPLANTABLE CARDIOVERTER DEFIBRILLATOR N/A 12/01/2013   St. Jude BI-VENTRICULAR IMPLANTABLE CARDIOVERTER DEFIBRILLATOR  (CRT-D);  Surgeon: Duke Salvia, MD;   . FOOT SURGERY    . LEFT AND RIGHT HEART CATHETERIZATION WITH CORONARY ANGIOGRAM  05/26/2012   Procedure: LEFT AND RIGHT HEART CATHETERIZATION WITH CORONARY ANGIOGRAM;  Surgeon: Tonny Bollman, MD;  Location: [facility] CATH LAB;  Service: Cardiovascular;;  . ORIF PATELLA Right 07/08/2017   Procedure: OPEN REDUCTION INTERNAL (ORIF) FIXATION PATELLA;  Surgeon: Sheral Apley, MD;  Location: [facility] OR;  Service: Orthopedics;  Laterality: Right;   Social History   Tobacco Use  . Smoking status: Former Games developer  . Smokeless tobacco: Never Used  . Tobacco comment: Quit "long time ago."  Substance Use Topics  . Alcohol use: No   Marital status: Married   ROS  Review of Systems  Cardiovascular: Negative for chest pain, dyspnea on exertion and leg swelling.  Gastrointestinal: Negative for melena.  Neurological: Positive for disturbances in coordination, focal weakness and paresthesias.   Objective  Blood pressure (!) 97/58, pulse 71, temperature (!) 97.4 F (36.3 C), temperature source Temporal, resp. rate 16, height 5\' 3"  (1.6 m), weight 152 lb (68.9 kg), SpO2 97 %.  Vitals with BMI 12/27/2019 09/23/2019 09/23/2019  Height 5\' 3"  - -  Weight 152 lbs - -  BMI 26.93 - -  Systolic 97 161 096  Diastolic 58 74 66  Pulse 71 60 60     Physical Exam  Constitutional:  She is moderately built and mildly obese in no acute distress  Cardiovascular: Normal rate, regular rhythm, intact distal pulses and normal pulses. Exam reveals no gallop.  Murmur heard. High-pitched blowing holosystolic murmur is present with a grade of 2/6 at the apex. No leg edema, no JVD.  Pulmonary/Chest: Effort normal and breath sounds normal.    Pacemaker/ICD site noted  in the left infraclavicular fossa.    Abdominal: Soft. Bowel sounds are normal.  Musculoskeletal:     Cervical back: Neck supple.  Neurological:  Right hemiparesis   Laboratory examination:   Recent Labs    09/16/19 1003 09/18/19 1114 09/23/19 0707  NA 134* 139 140  K 4.1 3.6 3.7  CL 100 105 106  CO2 23 26 26   GLUCOSE 343* 160* 137*  BUN 26* 10 19  CREATININE 0.66 0.44 0.56  CALCIUM 9.1 8.9 8.8*  GFRNONAA >60 >60 >60  GFRAA >60 >60 >60   CrCl cannot be calculated (Patient's most recent lab result is older than the maximum 21 days allowed.).  CMP Latest Ref Rng & Units 09/23/2019 09/18/2019 09/16/2019  Glucose 70 - 99 mg/dL 137(H) 160(H) 343(H)  BUN 8 - 23 mg/dL 19 10 26(H)  Creatinine 0.44 - 1.00 mg/dL 0.56 0.44 0.66  Sodium 135 - 145 mmol/L 140 139 134(L)  Potassium 3.5 - 5.1 mmol/L 3.7 3.6 4.1  Chloride 98 - 111 mmol/L 106 105 100  CO2 22 - 32 mmol/L 26 26 23   Calcium 8.9 - 10.3 mg/dL 8.8(L) 8.9 9.1  Total Protein 6.5 - 8.1 g/dL - - 7.5  Total Bilirubin 0.3 - 1.2 mg/dL - - 0.8  Alkaline Phos 38 - 126 U/L - - 90  AST 15 - 41 U/L - - 30  ALT 0 - 44 U/L - - 40   CBC Latest Ref Rng & Units 09/23/2019 09/18/2019 09/16/2019  WBC 4.0 - 10.5 K/uL 7.1 7.5 9.9  Hemoglobin 12.0 - 15.0 g/dL 13.6 14.8 14.9  Hematocrit 36.0 - 46.0 % 40.5 44.3 43.4  Platelets 150 - 400 K/uL 213 207 240   Lipid Panel     Component Value Date/Time   CHOL 157 12/20/2019 0845   TRIG 80 12/20/2019 0845   HDL 59 12/20/2019 0845   CHOLHDL 3 04/10/2017 0857   VLDL 19.8 04/10/2017 0857   LDLCALC 83 12/20/2019 0845   LDLDIRECT 141.0 06/09/2013 0919   HEMOGLOBIN A1C Lab Results  Component Value Date   HGBA1C 9.2 (H) 07/04/2017   MPG 217.34 07/04/2017   A1C 8.100 % of 09/07/2019  TSH Recent Labs    12/20/19 0845  TSH 2.110    Medications and allergies  No Known Allergies   Current Outpatient Medications  Medication Instructions  . acetaminophen (TYLENOL) 500 mg,  Oral, Every 6 hours PRN  . amLODipine (NORVASC) 10 mg, Oral, Daily  . aspirin EC 325 mg, Oral, Daily, For 30 days post op for DVT Prophylaxis  . atorvastatin (LIPITOR) 80 mg, Oral, Daily  . B-D UF III MINI PEN NEEDLES 31G X 5 MM MISC USE 5 NEEDLES EVERY DAY AS DIRECTED  . baclofen (LIORESAL) 10 mg, Oral, 3 times daily PRN  . carvedilol (COREG) 25 mg, Oral, 2 times daily  . docusate sodium (COLACE) 100 mg, Oral, 2 times daily, To prevent constipation while taking pain medication.  Marland Kitchen EPINEPHrine (EPIPEN) 0.3 mg, Intramuscular,  As needed, For allergic reactions  . furosemide (LASIX) 20 mg, Oral, 2 times daily  . glucose blood (CONTOUR NEXT TEST) test strip Use as instructed  . glucose blood (ONETOUCH VERIO) test strip USE TO TEST BLOOD SUGAR 3 TIMES DAILY  . hydrALAZINE (APRESOLINE) 25 MG tablet TAKE 1 TABLET(25 MG) BY MOUTH THREE TIMES DAILY  . insulin aspart (NOVOLOG FLEXPEN) 100 UNIT/ML FlexPen ADMINISTER 16 UNITS UNDER THE SKIN TWICE DAILY BEFORE LUNCH AND SUPPER  . Insulin Pen Needle (B-D UF III MINI PEN NEEDLES) 31G X 5 MM MISC USE 5 PEN NEEDLES PER DAY AS INSTRUCTED  . Insulin Pen Needle (B-D UF III MINI PEN NEEDLES) 31G X 5 MM MISC USE 5 NEEDLES EVERY DAY AS DIRECTED  . INVOKAMET [phone] MG TABS TAKE 1 TABLET BY MOUTH TWICE DAILY  . irbesartan (AVAPRO) 300 mg, Oral, Daily  . isosorbide dinitrate (ISORDIL) 30 MG tablet TAKE 1 TABLET(30 MG) BY MOUTH THREE TIMES DAILY  . lidocaine (LIDODERM) 5 % 1 patch, Transdermal, Every 24 hours, Remove & Discard patch within 12 hours or as directed by MD  . naproxen (NAPROSYN) 500 mg, Oral, 2 times daily PRN  . NOVOLOG FLEXPEN 100 UNIT/ML FlexPen ADMINISTER 16 UNITS UNDER THE SKIN TWICE DAILY BEFORE LUNCH AND SUPPER  . omeprazole (PRILOSEC) 20 mg, Oral, Daily, 30 days for gastroprotection while taking Aspirin.  Letta Pate DELICA LANCETS 33G MISC USE TO TEST BLOOD SUGAR LEVELS THREE TIMES DAILY  . oxybutynin (DITROPAN-XL) 5 mg, Oral, Daily  . potassium  chloride SA (K-DUR,KLOR-CON) 20 MEQ tablet TAKE 1 TABLET(20 MEQ) BY MOUTH DAILY  . VICTOZA 18 MG/3ML SOPN INJECT 1.8 MG UNDER THE SKIN DAILY    Radiology:   CT Head 09/16/2019: No evidence of acute intracranial injury. 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  Cardiac Studies:   Coronary Angiogram   [06-21-12]: No significant coronary artery disease. Ejection fraction 35%. Mild pulmonary hypertension.  Echocardiogram 03/22/2017: - Left ventricle: The cavity size was normal. Wall thickness was   increased in a pattern of moderate LVH. There was moderate   concentric hypertrophy. Systolic function was normal. The  estimated ejection fraction was in the range of 55% to 60%.  Doppler parameters are consistent with abnormal left ventricular  relaxation (grade 1 diastolic dysfunction). - Left atrium: The atrium was mildly dilated.  Vascular ultrasound right upper extremity venous duplex 09/02/2019: Right Upper extremity:  No evidence of deep vein thrombosis in the upper extremity. No evidence of superficial vein thrombosis in the upper extremity.  There is a heterogenous area of the medial right upper arm that exhibits internal low resistant vascularity.This is suggestive of possible malignancy versus unknown etiology. Ultrasound characteristics of prominent lymph nodes seen in the right neck in the area of the internal jugular vein.  CTA Chest 09/02/2019: 1. No pulmonary embolus.  No acute intrathoracic abnormality. 2. Dilatation of the main pulmonary artery, can be seen with pulmonary arterial hypertension.  Scheduled  In office ICD 10/13/19  Single (S)/Dual (D)/BV (M) BV Presenting AP/AS BP Pacer dependant: No Underlying NSR. AP 47%, BP 92.% PVC burden 5% AMS Episodes 60 (Noise).  AT/AF burden 0%. Longest AT 6 seconds. HVR 0.   Noise reversions noted but EGM turned off while in [place]. Turned on noise trigger.  Longevity 1.8 Years/Voltage. Charge time [employer]. Lead  measurements: Stable Histogram: Low (L)/normal (N)/high (H)  N Patient activity low. Thoracic impedance: Baseline.  Observations: Normal ICD function.  Changes: None  Scheduled Remote  ICD check  12/20/2019: There were 0 atrial high rate episodes detected since 10/13/19.  0 nonsustained episodes of ventricular tachycardia since 10/13/19. Health trends are stable. Corvue impedance monitoring does not suggest recent fluid accumulation. Battery longevity is 1.6 years. RA pacing is 64.0 %, RV pacing is 93.0 %, and LV pacing is 93.0 %.  EKG:  EKG 09/18/2019: AV paced rhythm at rate of 62 bpm, biventricular pacemaker no further analysis.  Assessment     ICD-10-CM   1. Chronic systolic heart failure (HCC)  S97.02   2. Nonischemic cardiomyopathy (HCC)  I42.8   3. Essential hypertension  I10   4. ICD Biventricular implantable cardioverter-defibrillator (ICD) in situ St[employer] Jude [employer] [phone] Quadra Assura 11/26/2013  Z95.810   5. Pure hypercholesterolemia  E78.00 atorvastatin (LIPITOR) 80 MG tablet    Lipid Panel With LDL/HDL Ratio    LDL cholesterol, direct    LDL cholesterol, direct    Lipid Panel With LDL/HDL Ratio    Recommendations:   Meds ordered this encounter  Medications  . atorvastatin (LIPITOR) 80 MG tablet    Sig: Take 1 tablet (80 mg total) by mouth daily.    Dispense:  90 tablet    Refill:  1    Rorie L Maertens  is a 69 y.o. 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 She returned to [place] on  08/20/2019. Since then she has had multiple evaluation to the emergency room (3).  She reestablished with me in January 2021, fortunately has not had any  reevaluation in the emergency room.  Presents for follow-up of congestive heart failure, hypertension and hyperlipidemia.   I reviewed her labs, LDL is slightly elevated and she may benefit from being on Zetia.  In view of multiple medications, she is willing to try Lipitor 80 mg.  Otherwise renal function and CBC are within normal limits.  No clinical evidence of heart failure.  I also reviewed her recently performed remote ICD monitoring, no clinical evidence and also ICD evidence of heart failure.  I will see her back in 3 months with lipid profile testing and for [facility].   Encouraged her to increase her physical activity as tolerated, to follow-up with her PCP Dr. Parke Simmers for management of her diabetes mellitus which is uncontrolled. She would be an excellent candidate for initiation of Farxiga in view of CHF.   [name], MD, [facility] 12/27/2019, 1:47 PM [facility] [place] Office: [phone]

## 2019-12-30 NOTE — Telephone Encounter (Signed)
Called and spoke with patient regarding her remote ICD check results.

## 2020-01-30 ENCOUNTER — Telehealth: Payer: Self-pay | Admitting: Cardiology

## 2020-01-31 NOTE — Telephone Encounter (Signed)
Called and spoke with patient regarding her ICD transmission results.

## 2020-03-12 ENCOUNTER — Telehealth: Payer: Self-pay | Admitting: Cardiology

## 2020-03-12 ENCOUNTER — Other Ambulatory Visit: Payer: Self-pay | Admitting: Cardiology

## 2020-03-12 DIAGNOSIS — I10 Essential (primary) hypertension: Secondary | ICD-10-CM

## 2020-03-14 NOTE — Telephone Encounter (Signed)
Called and spoke with patient regarding her Pacemaker/ICD results.

## 2020-03-16 LAB — LDL CHOLESTEROL, DIRECT: LDL Direct: 71 mg/dL (ref 0–99)

## 2020-03-16 LAB — LIPID PANEL WITH LDL/HDL RATIO
Cholesterol, Total: 138 mg/dL (ref 100–199)
HDL: 57 mg/dL (ref >39)
LDL Chol Calc (NIH): 69 mg/dL (ref 0–99)
LDL/HDL Ratio: 1.2 ratio (ref 0.0–3.2)
Triglycerides: 53 mg/dL (ref 0–149)
VLDL Cholesterol Cal: 12 mg/dL (ref 5–40)

## 2020-03-20 ENCOUNTER — Other Ambulatory Visit: Payer: Self-pay | Admitting: Cardiology

## 2020-03-20 DIAGNOSIS — I10 Essential (primary) hypertension: Secondary | ICD-10-CM

## 2020-03-22 ENCOUNTER — Encounter: Payer: Self-pay | Admitting: Cardiology

## 2020-03-22 ENCOUNTER — Ambulatory Visit: Payer: Medicare Other | Admitting: Cardiology

## 2020-03-22 ENCOUNTER — Other Ambulatory Visit: Payer: Self-pay

## 2020-03-22 VITALS — BP 110/57 | HR 60 | Resp 14 | Ht 63.0 in | Wt 168.0 lb

## 2020-03-22 DIAGNOSIS — I5022 Chronic systolic (congestive) heart failure: Secondary | ICD-10-CM

## 2020-03-22 DIAGNOSIS — Z95810 Presence of automatic (implantable) cardiac defibrillator: Secondary | ICD-10-CM

## 2020-03-22 DIAGNOSIS — I351 Nonrheumatic aortic (valve) insufficiency: Secondary | ICD-10-CM

## 2020-03-22 DIAGNOSIS — I340 Nonrheumatic mitral (valve) insufficiency: Secondary | ICD-10-CM

## 2020-03-22 NOTE — Progress Notes (Signed)
Primary Physician/Referring:  Renaye Rakers, MD  Patient ID: Linda Lowe, female    DOB: March 17, 1951, 69 y.o.   MRN: [id number]  Chief Complaint  Patient presents with  . Congestive Heart Failure  . Hyperlipidemia  . Results    Labs  . Follow-up    3 month   HPI:    Linda Lowe  is a 69 y.o. 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 She returned to [place] on  08/20/2019. Since then she has had multiple evaluation to the emergency room (3).    She has no specific complaints today, but is very dependant on her husband due to hemiparesis. Has had very unsteady gait.  She has gradually improved with regard to her strength, she is now trying to use a cane to go to the bathroom and also use a walker.  She is accompanied by her husband.  Past Medical History:  Diagnosis Date  . AICD (automatic cardioverter/defibrillator) present    St Jude   . Arthritis   . Cardiomyopathy (HCC)    a. 05/2012 Echo: EF 30-35%, Gr 1 DD, mild LVH, mild MR, pericardial effusion  . Chronic systolic heart failure (HCC) 08/26/2013  . Diabetes mellitus    a. Dx > 5 y ago  . Dyspnea   . Dysrhythmia   . Hypercholesteremia   . Hypertension   . ICD Biventricular implantable cardioverter-defibrillator (ICD) in situ [employer] [phone] Quadra Assura 11/26/2013    St Jude   . Pericardial effusion    a. 05/2012 Echo: circumferential pericardial effusion, mostly 78mm, largest pocket posterior - 38mm, mild RA indentation, no RV collapse, no tamponade.  . Stroke (HCC) 04/29/2018   Right hemiparesis in [place]   Past Surgical History:  Procedure Laterality Date  . ABDOMINAL  HYSTERECTOMY    . BI-VENTRICULAR IMPLANTABLE CARDIOVERTER DEFIBRILLATOR N/A 12/01/2013   St. Jude BI-VENTRICULAR IMPLANTABLE CARDIOVERTER DEFIBRILLATOR  (CRT-D);  Surgeon: Duke Salvia, MD;   . FOOT SURGERY    . LEFT AND RIGHT HEART CATHETERIZATION WITH CORONARY ANGIOGRAM  05/26/2012   Procedure: LEFT AND RIGHT HEART CATHETERIZATION WITH CORONARY ANGIOGRAM;  Surgeon: Tonny Bollman, MD;  Location: [facility] CATH LAB;  Service: Cardiovascular;;  . ORIF PATELLA Right 07/08/2017   Procedure: OPEN REDUCTION INTERNAL (ORIF) FIXATION PATELLA;  Surgeon: Sheral Apley, MD;  Location: [facility] OR;  Service: Orthopedics;  Laterality: Right;   Social History   Tobacco Use  . Smoking status: Former Smoker    Types: Cigarettes  . Smokeless tobacco: Never Used  . Tobacco comment: Quit "long time ago."  Substance Use Topics  . Alcohol use: No   Marital status: Married   ROS  Review of Systems  Cardiovascular: Negative for chest pain, dyspnea on exertion and leg swelling.  Gastrointestinal: Negative for melena.  Neurological: Positive for disturbances in coordination, focal weakness and paresthesias.   Objective  Blood pressure (!) 110/57, pulse 60, resp. rate 14, height 5\' 3"  (1.6 m), weight 168 lb (76.2 kg), SpO2 96 %.  Vitals with BMI 03/22/2020 12/27/2019 09/23/2019  Height 5\' 3"  5\' 3"  -  Weight 168 lbs 152 lbs -  BMI 29.77 26.93 -  Systolic 110 97 141  Diastolic 57 58  74  Pulse 60 71 60     Physical Exam Constitutional:      Comments: She is moderately built and mildly obese in no acute distress  Cardiovascular:     Rate and Rhythm: Normal rate and regular rhythm.     Pulses: Normal pulses and intact distal pulses.     Heart sounds: Murmur heard. High-pitched blowing holosystolic murmur is present with a grade of 2/6 at the apex.  No gallop.      Comments: No leg edema, no JVD. Pulmonary:     Effort: Pulmonary effort is normal.     Breath sounds: Normal breath sounds.  Abdominal:      General: Bowel sounds are normal.     Palpations: Abdomen is soft.  Musculoskeletal:     Cervical back: Neck supple.  Neurological:     Comments: Right hemiparesis    Laboratory examination:   Recent Labs    09/16/19 1003 09/18/19 1114 09/23/19 0707  NA 134* 139 140  K 4.1 3.6 3.7  CL 100 105 106  CO2 23 26 26   GLUCOSE 343* 160* 137*  BUN 26* 10 19  CREATININE 0.66 0.44 0.56  CALCIUM 9.1 8.9 8.8*  GFRNONAA >60 >60 >60  GFRAA >60 >60 >60   CrCl cannot be calculated (Patient's most recent lab result is older than the maximum 21 days allowed.).  CMP Latest Ref Rng & Units 09/23/2019 09/18/2019 09/16/2019  Glucose 70 - 99 mg/dL 163(W) 466(Z) 993(T)  BUN 8 - 23 mg/dL 19 10 70(V)  Creatinine 0.44 - 1.00 mg/dL 7.79 3.90 3.00  Sodium 135 - 145 mmol/L 140 139 134(L)  Potassium 3.5 - 5.1 mmol/L 3.7 3.6 4.1  Chloride 98 - 111 mmol/L 106 105 100  CO2 22 - 32 mmol/L 26 26 23   Calcium 8.9 - 10.3 mg/dL 9.2(Z) 8.9 9.1  Total Protein 6.5 - 8.1 g/dL - - 7.5  Total Bilirubin 0.3 - 1.2 mg/dL - - 0.8  Alkaline Phos 38 - 126 U/L - - 90  AST 15 - 41 U/L - - 30  ALT 0 - 44 U/L - - 40   CBC Latest Ref Rng & Units 09/23/2019 09/18/2019 09/16/2019  WBC 4.0 - 10.5 K/uL 7.1 7.5 9.9  Hemoglobin 12.0 - 15.0 g/dL 30.0 76.2 26.3  Hematocrit 36 - 46 % 40.5 44.3 43.4  Platelets 150 - 400 K/uL 213 207 240   Lipid Panel Recent Labs    12/20/19 0845 03/15/20 0838  CHOL 157 138  TRIG 80 53  LDLCALC 83 69  HDL 59 57  LDLDIRECT  --  71    HEMOGLOBIN A1C Lab Results  Component Value Date   HGBA1C 9.2 (H) 07/04/2017   MPG 217.34 07/04/2017   A1C 8.100 % of 09/07/2019  TSH Recent Labs    12/20/19 0845  TSH 2.110   External labs:  A1C 7.000 % of 02/02/2020 TSH 1.300 02/02/2020  Hemoglobin 13.700 g/ 02/02/2020 Platelets 206.000 T 02/02/2020  Creatinine, Serum 0.560 09/23/2019 Potassium 4.000 mm 02/02/2020 ALT (SGPT) 39.000 U/L 02/02/2020  Medications and allergies  No Known Allergies    Current Outpatient Medications  Medication Instructions  . amLODipine (NORVASC) 10 MG tablet TAKE 1 TABLET(10 MG) BY MOUTH DAILY  . atorvastatin (LIPITOR) 80 mg, Oral, Daily  . B-D UF III MINI PEN NEEDLES 31G X 5 MM MISC USE 5 NEEDLES EVERY DAY AS DIRECTED  . baclofen (LIORESAL) 10 mg, Oral, 3 times daily PRN  . carvedilol (COREG) 25  mg, Oral, 2 times daily  . EPINEPHrine (EPIPEN) 0.3 mg, Intramuscular, As needed, For allergic reactions  . furosemide (LASIX) 20 mg, Oral, 2 times daily  . glucose blood (CONTOUR NEXT TEST) test strip Use as instructed  . glucose blood (ONETOUCH VERIO) test strip USE TO TEST BLOOD SUGAR 3 TIMES DAILY  . hydrALAZINE (APRESOLINE) 25 MG tablet TAKE 1 TABLET(25 MG) BY MOUTH THREE TIMES DAILY  . insulin aspart (NOVOLOG FLEXPEN) 100 UNIT/ML FlexPen ADMINISTER 16 UNITS UNDER THE SKIN TWICE DAILY BEFORE LUNCH AND SUPPER  . Insulin Pen Needle (B-D UF III MINI PEN NEEDLES) 31G X 5 MM MISC USE 5 PEN NEEDLES PER DAY AS INSTRUCTED  . Insulin Pen Needle (B-D UF III MINI PEN NEEDLES) 31G X 5 MM MISC USE 5 NEEDLES EVERY DAY AS DIRECTED  . INVOKAMET [phone] MG TABS TAKE 1 TABLET BY MOUTH TWICE DAILY  . irbesartan (AVAPRO) 300 mg, Oral, Daily  . isosorbide dinitrate (ISORDIL) 30 MG tablet TAKE 1 TABLET(30 MG) BY MOUTH THREE TIMES DAILY  . lidocaine (LIDODERM) 5 % 1 patch, Transdermal, Every 24 hours, Remove & Discard patch within 12 hours or as directed by MD  . naproxen (NAPROSYN) 500 mg, Oral, 2 times daily PRN  . NOVOLOG FLEXPEN 100 UNIT/ML FlexPen ADMINISTER 16 UNITS UNDER THE SKIN TWICE DAILY BEFORE LUNCH AND SUPPER  . ONETOUCH DELICA LANCETS 33G MISC USE TO TEST BLOOD SUGAR LEVELS THREE TIMES DAILY  . potassium chloride SA (K-DUR,KLOR-CON) 20 MEQ tablet TAKE 1 TABLET(20 MEQ) BY MOUTH DAILY  . VICTOZA 18 MG/3ML SOPN INJECT 1.8 MG UNDER THE SKIN DAILY    Radiology:   CT Head 09/16/2019: No evidence of acute intracranial injury. 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  Cardiac Studies:   Coronary Angiogram   [05-30-12]: No significant coronary artery disease. Ejection fraction 35%. Mild pulmonary hypertension.  Vascular ultrasound right upper extremity venous duplex 09/02/2019: Right Upper extremity:  No evidence of deep vein thrombosis in the upper extremity. No evidence of superficial vein thrombosis in the upper extremity.  There is a heterogenous area of the medial right upper arm that exhibits internal low resistant vascularity.This is suggestive of possible malignancy versus unknown etiology. Ultrasound characteristics of prominent lymph nodes seen in the right neck in the area of the internal jugular vein.  CTA Chest 09/02/2019: 1. No pulmonary embolus.  No acute intrathoracic abnormality. 2. Dilatation of the main pulmonary artery, can be seen with pulmonary arterial hypertension.  Scheduled  In office ICD 10/13/19  Single (S)/Dual (D)/BV (M) BV Presenting AP/AS BP Pacer dependant: No Underlying NSR. AP 47%, BP 92.% PVC burden 5% AMS Episodes 60 (Noise).  AT/AF burden 0%. Longest AT 6 seconds. HVR 0.   Noise reversions noted but EGM turned off while in [place]. Turned on noise trigger.  Longevity 1.8 Years/Voltage. Charge time [employer]. Lead measurements: Stable Histogram: Low (L)/normal (N)/high (H)  N Patient activity low. Thoracic impedance: Baseline.  Observations: Normal ICD function.  Changes: None  Scheduled Remote ICD check  02/27/2020: There were 0 atrial high rate episodes detected since 10/13/19.  0 nonsustained episodes of ventricular tachycardia since 10/13/19. Health trends are stable. Corvue impedance monitoring does not suggest recent fluid accumulation. Battery longevity is 1.8 years. RA pacing is 55.0 %, RV pacing is 94.0 %, and LV pacing is 94.0 %.  Echocardiogram 10/12/2019:  Left ventricle cavity is normal in size. Mild concentric hypertrophy of  the left ventricle. Normal LV systolic function with  visual EF 50-55%. Normal global wall motion. Doppler evidence of grade I (impaired)  diastolic dysfunction, normal LAP.  Left atrial cavity is mildly dilated.  Trileaflet aortic valve. Trace aortic stenosis. Mild (Grade I) aortic  regurgitation.  Moderate, anteriorly directed, eccentric mitral regurgitation.  Mild tricuspid regurgitation. Estimated pulmonary artery systolic pressure  is 31 mmHg.  Mildly elevated PASP new since 2017.   EKG:  EKG 03/22/2020: AV paced rhythm, biventricular pacemaker detected.  No further analysis. No significant change from EKG 09/18/2019   Assessment     ICD-10-CM   1. Chronic systolic heart failure (HCC)  B09.62 EKG 12-Lead  2. ICD Biventricular implantable cardioverter-defibrillator (ICD) in situ [employer] [phone] Quadra Assura 11/26/2013  Z95.810   3. Moderate mitral regurgitation  I34.0   4. Moderate aortic regurgitation  I35.1     Recommendations:   No orders of the defined types were placed in this encounter.   Linda Lowe  is a 69 y.o. 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 She returned to [place] on  08/20/2019.   Presents for follow-up of congestive heart failure, hypertension and hyperlipidemia.  She has recuperated well, has started gaining some weight, is trying her best to increase her physical activity.  I reviewed her labs, since increasing the dose of atorvastatin, lipids are under excellent control.  Blood pressure is also well controlled, external labs reviewed.  Her diabetes is not very well controlled with A1c has reduced from 9.0 to the present 7.0 with the addition of Victoza.  From cardiac  standpoint, no clinical evidence of heart failure.  Her ICD is functioning normally.  She will continue remote monitoring.  I will see him back in 6 months.  She does have moderate mitral regurgitation and aortic regurgitation, we will continue to monitor this clinically.   [name], MD, [facility] 03/22/2020, 10:51 AM Office: [phone]

## 2020-06-09 ENCOUNTER — Other Ambulatory Visit: Payer: Self-pay | Admitting: Cardiology

## 2020-06-09 DIAGNOSIS — I10 Essential (primary) hypertension: Secondary | ICD-10-CM

## 2020-06-24 ENCOUNTER — Telehealth: Payer: Self-pay | Admitting: Cardiology

## 2020-06-28 NOTE — Telephone Encounter (Signed)
Called patient, NA, LMAM

## 2020-06-28 NOTE — Telephone Encounter (Signed)
Patient's husband called back, he stated that patient is out of the country and doesn't know when she will be back. He also stated that he unplugged the device as soon as she left, since she was not here to do the transmissions.

## 2020-06-30 ENCOUNTER — Other Ambulatory Visit: Payer: Self-pay | Admitting: Cardiology

## 2020-06-30 DIAGNOSIS — E7800 Pure hypercholesterolemia, unspecified: Secondary | ICD-10-CM

## 2020-09-06 ENCOUNTER — Other Ambulatory Visit: Payer: Self-pay | Admitting: Cardiology

## 2020-09-06 DIAGNOSIS — I10 Essential (primary) hypertension: Secondary | ICD-10-CM

## 2020-09-15 ENCOUNTER — Other Ambulatory Visit: Payer: Self-pay | Admitting: Cardiology

## 2020-09-15 DIAGNOSIS — I10 Essential (primary) hypertension: Secondary | ICD-10-CM

## 2020-09-25 ENCOUNTER — Ambulatory Visit: Payer: Medicare Other | Admitting: Cardiology

## 2020-12-02 ENCOUNTER — Other Ambulatory Visit: Payer: Self-pay | Admitting: Cardiology

## 2020-12-02 DIAGNOSIS — I10 Essential (primary) hypertension: Secondary | ICD-10-CM

## 2020-12-13 ENCOUNTER — Telehealth: Payer: Self-pay | Admitting: Cardiology

## 2020-12-15 NOTE — Telephone Encounter (Signed)
Called patient, female answered and stated that patient is on vacation since before November and will not be back until June. He will have her give [place] a call when she contacts him, or when she gets back. I will follow up with her in 2 months.

## 2021-04-01 ENCOUNTER — Other Ambulatory Visit: Payer: Self-pay | Admitting: Cardiology

## 2021-04-01 DIAGNOSIS — I10 Essential (primary) hypertension: Secondary | ICD-10-CM

## 2021-04-24 ENCOUNTER — Telehealth: Payer: Self-pay

## 2021-04-24 NOTE — Telephone Encounter (Signed)
Patient's last status was she was still out of the country. I have made several attempts to her phone and her husband phone to contact her, but have not had any success. Phone call also documented in [employer].

## 2021-04-25 ENCOUNTER — Encounter: Payer: Self-pay | Admitting: Cardiology

## 2021-04-25 ENCOUNTER — Ambulatory Visit: Payer: Medicare Other | Admitting: Cardiology

## 2021-04-25 ENCOUNTER — Other Ambulatory Visit: Payer: Self-pay

## 2021-04-25 VITALS — BP 146/69 | HR 62 | Temp 98.0°F | Resp 16 | Ht 63.0 in | Wt 140.4 lb

## 2021-04-25 DIAGNOSIS — I351 Nonrheumatic aortic (valve) insufficiency: Secondary | ICD-10-CM | POA: Diagnosis not present

## 2021-04-25 DIAGNOSIS — I5022 Chronic systolic (congestive) heart failure: Secondary | ICD-10-CM | POA: Diagnosis not present

## 2021-04-25 DIAGNOSIS — Z95810 Presence of automatic (implantable) cardiac defibrillator: Secondary | ICD-10-CM | POA: Diagnosis not present

## 2021-04-25 DIAGNOSIS — Z794 Long term (current) use of insulin: Secondary | ICD-10-CM | POA: Diagnosis not present

## 2021-04-25 DIAGNOSIS — E7800 Pure hypercholesterolemia, unspecified: Secondary | ICD-10-CM

## 2021-04-25 DIAGNOSIS — I428 Other cardiomyopathies: Secondary | ICD-10-CM | POA: Diagnosis not present

## 2021-04-25 DIAGNOSIS — Z4502 Encounter for adjustment and management of automatic implantable cardiac defibrillator: Secondary | ICD-10-CM

## 2021-04-25 DIAGNOSIS — E119 Type 2 diabetes mellitus without complications: Secondary | ICD-10-CM

## 2021-04-25 NOTE — Progress Notes (Addendum)
Primary Physician/Referring:  Lucianne Lei, MD  Patient ID: Linda Lowe, female    DOB: 02-08-1951, 70 y.o.   MRN: [id number]  Chief Complaint  Patient presents with   Pacemaker Check   Congestive Heart Failure   HPI:    Linda Lowe  is a 70 y.o. 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 She returned to [place] on  08/20/2019.  She was again back in [place] for the past 6 to 8 months, she just returned home yesterday.  Because of dyspnea, and need for ICD check, we brought her in on an urgent basis.  A month ago she had acute decompensated heart failure while in [place].  Leg edema has resolved mostly and dyspnea has improved but she still has residual dyspnea.  She has not had any chest pain.  Since being in [place], she has lost a modality to walk and has been wheelchair-bound.  Husband is present.  She is very dependent on her husband due to right hemiparesis and unsteady gait.     Past Medical History:  Diagnosis Date   AICD (automatic cardioverter/defibrillator) present    St Jude    Arthritis    Cardiomyopathy ([place])    a. 05/2012 Echo: EF 30-35%, Gr 1 DD, mild LVH, mild MR, pericardial effusion   Chronic systolic heart failure ([place]) 08/26/2013   Diabetes mellitus    a. Dx > 5 y ago   Dyspnea    Dysrhythmia    Hypercholesteremia    Hypertension    ICD Biventricular implantable cardioverter-defibrillator (ICD) in situ [employer] [phone] Quadra Assura 11/26/2013    St Jude    Pericardial effusion    a. 05/2012 Echo: circumferential pericardial effusion, mostly 62m, largest pocket posterior - 290m mild RA indentation, no RV  collapse, no tamponade.   [place]   Right hemiparesis in [place]   Past Surgical History:  Procedure Laterality Date   ABDOMINAL HYSTERECTOMY     BI-VENTRICULAR IMPLANTABLE CARDIOVERTER DEFIBRILLATOR N/A 12/01/2013   St. Jude BI-VENTRICULAR IMPLANTABLE CARDIOVERTER DEFIBRILLATOR  (CRT-D);  Surgeon: StDeboraha SprangMD;    FOOT SURGERY     LEFT AND RIGHT HEART CATHETERIZATION WITH CORONARY ANGIOGRAM  05/26/2012   Procedure: LEFT AND RIGHT HEART CATHETERIZATION WITH CORONARY ANGIOGRAM;  Surgeon: MiSherren MochaMD;  [facility];  Service: Cardiovascular;;   ORIF PATELLA Right 07/08/2017   Procedure: OPEN REDUCTION INTERNAL (ORIF) FIXATION PATELLA;  Surgeon: MuRenette ButtersMD;  [facility];  Laterality: Right;   Social History   Tobacco Use   Smoking status: Former    Types: Cigarettes   Smokeless tobacco: Never   Tobacco comments:    Quit "long time ago."  Substance Use Topics   Alcohol use: No   Marital status: Married   ROS  Review of Systems  Cardiovascular:  Positive for dyspnea on exertion and leg swelling. Negative for chest pain.  Gastrointestinal:  Negative for melena.  Neurological:  Positive for disturbances in coordination, focal weakness (Right hemiparesis) and paresthesias.  Objective  Blood pressure (!) 146/69, pulse 62, temperature 98 F (36.7 C), temperature source Temporal, resp. rate 16, height '5\' 3"'  (1.6 m), weight 140  lb 6.4 oz (63.7 kg), SpO2 97 %.  Vitals with BMI 04/25/2021 03/22/2020 12/27/2019  Height '5\' 3"'  '5\' 3"'  '5\' 3"'   Weight 140 lbs 6 oz 168 lbs 152 lbs  BMI 24.88 28.31 51.76  Systolic 160 737 97  Diastolic 69 57 58  Pulse 62 60 71     Physical Exam Constitutional:      Comments: She is moderately built and mildly obese in no acute distress  Neck:     Vascular: No carotid bruit or JVD.  Cardiovascular:     Rate and Rhythm: Normal rate and regular rhythm.     Pulses: Normal pulses and  intact distal pulses.     Heart sounds: Murmur heard.  High-pitched blowing holosystolic murmur is present with a grade of 2/6 at the apex.    No gallop.     Comments: Left infraclavicular pacemaker pocket is healthy.  There is a superficial wound noted on the lateral aspect and superior aspect of the pocket from having scratched once, very superficial and does not appear infected. Pulmonary:     Effort: Pulmonary effort is normal.     Breath sounds: Normal breath sounds.  Abdominal:     General: Bowel sounds are normal.     Palpations: Abdomen is soft.  Musculoskeletal:     Cervical back: Neck supple.     Right lower leg: Edema (1-2+ below-knee pitting edema) present.     Left lower leg: Edema (Trace pitting edema) present.  Skin:    General: Skin is warm.     Capillary Refill: Capillary refill takes less than 2 seconds.  Neurological:     Mental Status: Mental status is at baseline.     Comments: Right hemiparesis   Laboratory examination:   No results for input(s): NA, K, CL, CO2, GLUCOSE, BUN, CREATININE, CALCIUM, GFRNONAA, GFRAA in the last 8760 hours.  CrCl cannot be calculated (Patient's most recent lab result is older than the maximum 21 days allowed.).  CMP Latest Ref Rng & Units 09/23/2019 09/18/2019 09/16/2019  Glucose 70 - 99 mg/dL 137(H) 160(H) 343(H)  BUN 8 - 23 mg/dL 19 10 26(H)  Creatinine 0.44 - 1.00 mg/dL 0.56 0.44 0.66  Sodium 135 - 145 mmol/L 140 139 134(L)  Potassium 3.5 - 5.1 mmol/L 3.7 3.6 4.1  Chloride 98 - 111 mmol/L 106 105 100  CO2 22 - 32 mmol/L '26 26 23  ' Calcium 8.9 - 10.3 mg/dL 8.8(L) 8.9 9.1  Total Protein 6.5 - 8.1 g/dL - - 7.5  Total Bilirubin 0.3 - 1.2 mg/dL - - 0.8  Alkaline Phos 38 - 126 U/L - - 90  AST 15 - 41 U/L - - 30  ALT 0 - 44 U/L - - 40   CBC Latest Ref Rng & Units 09/23/2019 09/18/2019 09/16/2019  WBC 4.0 - 10.5 K/uL 7.1 7.5 9.9  Hemoglobin 12.0 - 15.0 g/dL 13.6 14.8 14.9  Hematocrit 36.0 - 46.0 % 40.5 44.3 43.4  Platelets 150 - 400  K/uL 213 207 240   Lipid Panel No results for input(s): CHOL, TRIG, LDLCALC, VLDL, HDL, CHOLHDL, LDLDIRECT in the last 8760 hours.   HEMOGLOBIN A1C Lab Results  Component Value Date   HGBA1C 9.2 (H) 07/04/2017   MPG 217.34 07/04/2017   A1C 8.100 % of 09/07/2019  TSH No results for input(s): TSH in the last 8760 hours.  External labs:  A1C 7.000 % of 02/02/2020 TSH 1.300 02/02/2020  Hemoglobin 13.700 g/ 02/02/2020 Platelets 206.000 T 02/02/2020  Creatinine, Serum 0.560 09/23/2019 Potassium 4.000 mm 02/02/2020 ALT (SGPT) 39.000 U/L 02/02/2020  Medications and allergies  No Known Allergies   Current Outpatient Medications  Medication Instructions   amLODipine (NORVASC) 10 MG tablet TAKE 1 TABLET(10 MG) BY MOUTH DAILY   atorvastatin (LIPITOR) 80 MG tablet TAKE 1 TABLET(80 MG) BY MOUTH DAILY   B-D UF III MINI PEN NEEDLES 31G X 5 MM MISC USE 5 NEEDLES EVERY DAY AS DIRECTED   carvedilol (COREG) 25 mg, Oral, 2 times daily   furosemide (LASIX) 20 mg, Oral, 2 times daily   glucose blood (CONTOUR NEXT TEST) test strip Use as instructed   glucose blood (ONETOUCH VERIO) test strip USE TO TEST BLOOD SUGAR 3 TIMES DAILY   hydrALAZINE (APRESOLINE) 25 MG tablet TAKE 1 TABLET(25 MG) BY MOUTH THREE TIMES DAILY   insulin aspart (NOVOLOG FLEXPEN) 100 UNIT/ML FlexPen ADMINISTER 16 UNITS UNDER THE SKIN TWICE DAILY BEFORE LUNCH AND SUPPER   Insulin Pen Needle (B-D UF III MINI PEN NEEDLES) 31G X 5 MM MISC USE 5 PEN NEEDLES PER DAY AS INSTRUCTED   Insulin Pen Needle (B-D UF III MINI PEN NEEDLES) 31G X 5 MM MISC USE 5 NEEDLES EVERY DAY AS DIRECTED   INVOKAMET [phone] MG TABS TAKE 1 TABLET BY MOUTH TWICE DAILY   irbesartan (AVAPRO) 300 mg, Oral, Daily   isosorbide dinitrate (ISORDIL) 30 MG tablet TAKE 1 TABLET(30 MG) BY MOUTH THREE TIMES DAILY   NOVOLOG FLEXPEN 100 UNIT/ML FlexPen ADMINISTER 16 UNITS UNDER THE SKIN TWICE DAILY BEFORE LUNCH AND SUPPER   ONETOUCH DELICA LANCETS 53Z MISC USE TO TEST BLOOD  SUGAR LEVELS THREE TIMES DAILY   potassium chloride SA (K-DUR,KLOR-CON) 20 MEQ tablet TAKE 1 TABLET(20 MEQ) BY MOUTH DAILY   VICTOZA 18 MG/3ML SOPN INJECT 1.8 MG UNDER THE SKIN DAILY    Radiology:   CT Head 09/16/2019: No evidence of acute intracranial injury. 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  Cardiac Studies:   Coronary Angiogram   [Jun 16, 2012]: No significant coronary artery disease. Ejection fraction 35%. Mild pulmonary hypertension.  Vascular ultrasound right upper extremity venous duplex 09/02/2019: Right Upper extremity:  No evidence of deep vein thrombosis in the upper extremity. No evidence of superficial vein thrombosis in the upper extremity.  There is a heterogenous area of the medial right upper arm that exhibits internal low resistant vascularity.This is suggestive of possible malignancy versus unknown etiology. Ultrasound characteristics of prominent lymph nodes seen in the right neck in the area of the internal jugular vein.  CTA Chest 09/02/2019: 1. No pulmonary embolus.  No acute intrathoracic abnormality. 2. Dilatation of the main pulmonary artery, can be seen with pulmonary arterial hypertension.   Echocardiogram 10/12/2019:  Left ventricle cavity is normal in size. Mild concentric hypertrophy of  the left ventricle. Normal LV systolic function with visual EF 50-55%. Normal global wall motion. Doppler evidence of grade I (impaired)  diastolic dysfunction, normal LAP.  Left atrial cavity is mildly dilated.  Trileaflet aortic valve.  Trace aortic stenosis. Mild (Grade I) aortic  regurgitation.  Moderate, anteriorly directed, eccentric mitral regurgitation.  Mild tricuspid regurgitation. Estimated pulmonary artery systolic pressure  is 31 mmHg.  Mildly elevated PASP new since 2017.    Device check: ICD Biventricular implantable cardioverter-defibrillator (ICD) in situ [employer] [phone] Quadra Assura 11/26/2013   Scheduled  In office ICD  04/25/21  Single (S)/Dual (D)/BV (M) BV Presenting APBP Pacer dependant: No. Underlying NSR. AP 47%. BP 99% AMS Episodes 0.  HVR 0.  Longevity 6.3 months/Voltage.  Lead measurements: Stable Histogram: Low (L)/normal (N)/high (H) : Normal patient activity Low (<1 hour daily). Thoracic impedance: Suggestion of fluid overload 2 weeks ago, trending back to baseline  Observations: Normal BiV ICD function.  Changes: None  EKG:  EKG 03/22/2020: AV paced rhythm, biventricular pacemaker detected.  No further analysis. No significant change from EKG 09/18/2019   Assessment     ICD-10-CM   1. Encounter for management of biventricular implantable cardioverter-defibrillator (ICD)  Z45.02     2. ICD Biventricular implantable cardioverter-defibrillator (ICD) in situ [employer] [phone] Quadra Assura 11/26/2013  Z95.810     3. Chronic systolic heart failure (HCC)  I50.22 CBC    TSH    4. Nonischemic cardiomyopathy (HCC)  I42.8 Brain natriuretic peptide    PCV ECHOCARDIOGRAM COMPLETE    5. Moderate aortic regurgitation  I35.1 PCV ECHOCARDIOGRAM COMPLETE    6. Pure hypercholesterolemia  E78.00 Lipid Panel With LDL/HDL Ratio    7. Type 2 diabetes mellitus without complication, with long-term current use of insulin (HCC)  E11.9 CMP14+EGFR   Z79.4 Hgb A1c w/o eAG      Recommendations:   No orders of the defined types were placed in this encounter.   Elesha L Bingman  is a 70 y.o. 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 She returned to [place] on  08/20/2019.  She was again back in [place] for the past 6 to 8 months, she just returned home  yesterday.  Because of dyspnea, and need for ICD check, we brought her in on an urgent basis. A month ago she had acute decompensated heart failure while in [place].  Leg edema has resolved mostly and dyspnea has improved but she still has residual dyspnea.    Today she is not in acute decompensated heart failure although she appears to be very much deconditioned and is wheelchair-bound, previously was trying to use a cane.  Husband is very helpful and supportive.  He did not bring the medications, hence I am not sure exactly what she is taking, the fact that she is not in florid heart failure although she has faint bibasilar crackles and leg edema, I have placed some lab orders which I will also forward to her PCP and advised him to make an appointment to see Dr. Darlyne Russian but I would like to see her back in our office in a week to 10 days unless she has worsening dyspnea or worsening leg edema to call us so we can be seen early.  I will also repeat an echocardiogram to follow-up on LVEF and also mitral and aortic regurgitation.  Her BiV ICD was interrogated today by me, normal function, she had mild heart failure about 2 weeks ago which is now returning back to baseline.  She has not had any high ventricular rates or mode switches.  She does not have pocket infection.  She has a very small superficial wound close to the ICD pocket after she scratched it once.  Advised her to use simple Band-Aid until it dries up.   Adrian Prows, MD, [facility] 04/25/2021, 4:50 PM Office: [phone]

## 2021-04-25 NOTE — Telephone Encounter (Signed)
Patient husband called back today and stated that patient has returned from [place] and was told from a doctor there that she may have a pocket infection around the ICD implant and has not had a remote transmission, nor an in-clinic check in more than a year. I have contacted [place] with [employer] and someone will be here this afternoon for ICD check @ 3pm, patient husband aware.   Do you want me to make a separate office visit, or add her on today for both? Please advise.   Also documented in [facility].

## 2021-05-01 ENCOUNTER — Telehealth: Payer: Self-pay | Admitting: Cardiology

## 2021-05-01 NOTE — Telephone Encounter (Signed)
Pt's spouse called about monitor; monitor beeping nonstop (audible over the phone as well). Would like assistance with the device from a medical assistant.

## 2021-05-02 ENCOUNTER — Other Ambulatory Visit: Payer: Self-pay

## 2021-05-02 ENCOUNTER — Inpatient Hospital Stay (HOSPITAL_COMMUNITY)
Admission: EM | Admit: 2021-05-02 | Discharge: 2021-05-08 | DRG: 193 | Disposition: A | Discharge status: Home / Self Care | Payer: Medicare Other | Attending: Internal Medicine | Admitting: Internal Medicine

## 2021-05-02 ENCOUNTER — Encounter (HOSPITAL_COMMUNITY): Payer: Self-pay

## 2021-05-02 ENCOUNTER — Emergency Department (HOSPITAL_COMMUNITY): Payer: Medicare Other

## 2021-05-02 DIAGNOSIS — Z66 Do not resuscitate: Secondary | ICD-10-CM | POA: Diagnosis present

## 2021-05-02 DIAGNOSIS — Z831 Family history of other infectious and parasitic diseases: Secondary | ICD-10-CM

## 2021-05-02 DIAGNOSIS — Z95810 Presence of automatic (implantable) cardiac defibrillator: Secondary | ICD-10-CM | POA: Diagnosis present

## 2021-05-02 DIAGNOSIS — E1165 Type 2 diabetes mellitus with hyperglycemia: Secondary | ICD-10-CM | POA: Diagnosis not present

## 2021-05-02 DIAGNOSIS — Z90710 Acquired absence of both cervix and uterus: Secondary | ICD-10-CM

## 2021-05-02 DIAGNOSIS — M1990 Unspecified osteoarthritis, unspecified site: Secondary | ICD-10-CM | POA: Diagnosis present

## 2021-05-02 DIAGNOSIS — K269 Duodenal ulcer, unspecified as acute or chronic, without hemorrhage or perforation: Secondary | ICD-10-CM

## 2021-05-02 DIAGNOSIS — Z209 Contact with and (suspected) exposure to unspecified communicable disease: Secondary | ICD-10-CM | POA: Diagnosis not present

## 2021-05-02 DIAGNOSIS — Z794 Long term (current) use of insulin: Secondary | ICD-10-CM

## 2021-05-02 DIAGNOSIS — R112 Nausea with vomiting, unspecified: Secondary | ICD-10-CM | POA: Diagnosis not present

## 2021-05-02 DIAGNOSIS — K5730 Diverticulosis of large intestine without perforation or abscess without bleeding: Secondary | ICD-10-CM | POA: Diagnosis present

## 2021-05-02 DIAGNOSIS — Z20822 Contact with and (suspected) exposure to covid-19: Secondary | ICD-10-CM | POA: Diagnosis present

## 2021-05-02 DIAGNOSIS — Z87891 Personal history of nicotine dependence: Secondary | ICD-10-CM

## 2021-05-02 DIAGNOSIS — E7800 Pure hypercholesterolemia, unspecified: Secondary | ICD-10-CM | POA: Diagnosis present

## 2021-05-02 DIAGNOSIS — K449 Diaphragmatic hernia without obstruction or gangrene: Secondary | ICD-10-CM | POA: Diagnosis present

## 2021-05-02 DIAGNOSIS — I420 Dilated cardiomyopathy: Secondary | ICD-10-CM | POA: Diagnosis not present

## 2021-05-02 DIAGNOSIS — I428 Other cardiomyopathies: Secondary | ICD-10-CM

## 2021-05-02 DIAGNOSIS — K264 Chronic or unspecified duodenal ulcer with hemorrhage: Secondary | ICD-10-CM | POA: Diagnosis present

## 2021-05-02 DIAGNOSIS — I5022 Chronic systolic (congestive) heart failure: Secondary | ICD-10-CM | POA: Diagnosis not present

## 2021-05-02 DIAGNOSIS — D649 Anemia, unspecified: Secondary | ICD-10-CM | POA: Diagnosis not present

## 2021-05-02 DIAGNOSIS — E860 Dehydration: Secondary | ICD-10-CM | POA: Diagnosis present

## 2021-05-02 DIAGNOSIS — Z79899 Other long term (current) drug therapy: Secondary | ICD-10-CM

## 2021-05-02 DIAGNOSIS — I10 Essential (primary) hypertension: Secondary | ICD-10-CM | POA: Diagnosis present

## 2021-05-02 DIAGNOSIS — K254 Chronic or unspecified gastric ulcer with hemorrhage: Secondary | ICD-10-CM | POA: Diagnosis not present

## 2021-05-02 DIAGNOSIS — R059 Cough, unspecified: Secondary | ICD-10-CM | POA: Diagnosis not present

## 2021-05-02 DIAGNOSIS — J101 Influenza due to other identified influenza virus with other respiratory manifestations: Secondary | ICD-10-CM | POA: Diagnosis not present

## 2021-05-02 DIAGNOSIS — R509 Fever, unspecified: Secondary | ICD-10-CM | POA: Diagnosis not present

## 2021-05-02 DIAGNOSIS — D62 Acute posthemorrhagic anemia: Secondary | ICD-10-CM | POA: Diagnosis present

## 2021-05-02 DIAGNOSIS — Z993 Dependence on wheelchair: Secondary | ICD-10-CM

## 2021-05-02 DIAGNOSIS — I110 Hypertensive heart disease with heart failure: Secondary | ICD-10-CM | POA: Diagnosis present

## 2021-05-02 DIAGNOSIS — J1000 Influenza due to other identified influenza virus with unspecified type of pneumonia: Secondary | ICD-10-CM | POA: Diagnosis not present

## 2021-05-02 DIAGNOSIS — D509 Iron deficiency anemia, unspecified: Secondary | ICD-10-CM | POA: Diagnosis present

## 2021-05-02 DIAGNOSIS — R1111 Vomiting without nausea: Secondary | ICD-10-CM | POA: Diagnosis not present

## 2021-05-02 DIAGNOSIS — K644 Residual hemorrhoidal skin tags: Secondary | ICD-10-CM | POA: Diagnosis present

## 2021-05-02 DIAGNOSIS — I517 Cardiomegaly: Secondary | ICD-10-CM | POA: Diagnosis not present

## 2021-05-02 DIAGNOSIS — I69351 Hemiplegia and hemiparesis following cerebral infarction affecting right dominant side: Secondary | ICD-10-CM

## 2021-05-02 DIAGNOSIS — J189 Pneumonia, unspecified organism: Secondary | ICD-10-CM

## 2021-05-02 DIAGNOSIS — K648 Other hemorrhoids: Secondary | ICD-10-CM | POA: Diagnosis present

## 2021-05-02 DIAGNOSIS — Z823 Family history of stroke: Secondary | ICD-10-CM

## 2021-05-02 LAB — URINALYSIS, ROUTINE W REFLEX MICROSCOPIC
Bacteria, UA: NONE SEEN
Bilirubin Urine: NEGATIVE
Glucose, UA: >=500 mg/dL — AB
Hgb urine dipstick: NEGATIVE
Ketones, ur: 5 mg/dL — AB
Nitrite: NEGATIVE
Protein, ur: NEGATIVE mg/dL
Specific Gravity, Urine: 1.018 (ref 1.005–1.030)
pH: 5 (ref 5.0–8.0)

## 2021-05-02 LAB — COMPREHENSIVE METABOLIC PANEL
ALT: 24 U/L (ref 0–44)
AST: 30 U/L (ref 15–41)
Albumin: 3.1 g/dL — ABNORMAL LOW (ref 3.5–5.0)
Alkaline Phosphatase: 77 U/L (ref 38–126)
Anion gap: 10 (ref 5–15)
BUN: 44 mg/dL — ABNORMAL HIGH (ref 8–23)
CO2: 21 mmol/L — ABNORMAL LOW (ref 22–32)
Calcium: 8.5 mg/dL — ABNORMAL LOW (ref 8.9–10.3)
Chloride: 107 mmol/L (ref 98–111)
Creatinine, Ser: 0.74 mg/dL (ref 0.44–1.00)
GFR, Estimated: >60 mL/min (ref >60)
Glucose, Bld: 165 mg/dL — ABNORMAL HIGH (ref 70–99)
Potassium: 3.5 mmol/L (ref 3.5–5.1)
Sodium: 138 mmol/L (ref 135–145)
Total Bilirubin: 0.7 mg/dL (ref 0.3–1.2)
Total Protein: 6.4 g/dL — ABNORMAL LOW (ref 6.5–8.1)

## 2021-05-02 LAB — CBC WITH DIFFERENTIAL/PLATELET
Abs Immature Granulocytes: 0.06 10*3/uL (ref 0.00–0.07)
Basophils Absolute: 0 10*3/uL (ref 0.0–0.1)
Basophils Relative: 0 %
Eosinophils Absolute: 0 10*3/uL (ref 0.0–0.5)
Eosinophils Relative: 0 %
HCT: 23.4 % — ABNORMAL LOW (ref 36.0–46.0)
Hemoglobin: 7.4 g/dL — ABNORMAL LOW (ref 12.0–15.0)
Immature Granulocytes: 1 %
Lymphocytes Relative: 16 %
Lymphs Abs: 2 10*3/uL (ref 0.7–4.0)
MCH: 26.3 pg (ref 26.0–34.0)
MCHC: 31.6 g/dL (ref 30.0–36.0)
MCV: 83.3 fL (ref 80.0–100.0)
Monocytes Absolute: 0.8 10*3/uL (ref 0.1–1.0)
Monocytes Relative: 7 %
Neutro Abs: 9.5 10*3/uL — ABNORMAL HIGH (ref 1.7–7.7)
Neutrophils Relative %: 76 %
Platelets: 328 10*3/uL (ref 150–400)
RBC: 2.81 MIL/uL — ABNORMAL LOW (ref 3.87–5.11)
RDW: 16.5 % — ABNORMAL HIGH (ref 11.5–15.5)
WBC: 12.4 10*3/uL — ABNORMAL HIGH (ref 4.0–10.5)
nRBC: 0 % (ref 0.0–0.2)

## 2021-05-02 LAB — TROPONIN I (HIGH SENSITIVITY)
Troponin I (High Sensitivity): 17 ng/L (ref <18)
Troponin I (High Sensitivity): 17 ng/L (ref <18)

## 2021-05-02 LAB — RESP PANEL BY RT-PCR (FLU A&B, COVID) ARPGX2
Influenza A by PCR: POSITIVE — AB
Influenza A by PCR: POSITIVE — AB
Influenza B by PCR: NEGATIVE
Influenza B by PCR: NEGATIVE
SARS Coronavirus 2 by RT PCR: NEGATIVE
SARS Coronavirus 2 by RT PCR: NEGATIVE

## 2021-05-02 LAB — IRON AND TIBC
Iron: 15 ug/dL — ABNORMAL LOW (ref 28–170)
Saturation Ratios: 5 % — ABNORMAL LOW (ref 10.4–31.8)
TIBC: 300 ug/dL (ref 250–450)
UIBC: 285 ug/dL

## 2021-05-02 LAB — LACTATE DEHYDROGENASE: LDH: 199 U/L — ABNORMAL HIGH (ref 98–192)

## 2021-05-02 LAB — CBG MONITORING, ED
Glucose-Capillary: 154 mg/dL — ABNORMAL HIGH (ref 70–99)
Glucose-Capillary: 98 mg/dL (ref 70–99)

## 2021-05-02 LAB — DIRECT ANTIGLOBULIN TEST (NOT AT ARMC)
DAT, IgG: NEGATIVE
DAT, complement: NEGATIVE

## 2021-05-02 LAB — PROCALCITONIN: Procalcitonin: <0.1 ng/mL

## 2021-05-02 LAB — BRAIN NATRIURETIC PEPTIDE: B Natriuretic Peptide: 18.5 pg/mL (ref 0.0–100.0)

## 2021-05-02 LAB — FERRITIN: Ferritin: 18 ng/mL (ref 11–307)

## 2021-05-02 LAB — LIPASE, BLOOD: Lipase: 26 U/L (ref 11–51)

## 2021-05-02 LAB — ABO/RH: ABO/RH(D): B POS

## 2021-05-02 MED ORDER — ONDANSETRON HCL 4 MG/2ML IJ SOLN: 4.0000 mg | Freq: Once | INTRAMUSCULAR | Status: DC

## 2021-05-02 MED ORDER — ONDANSETRON HCL 4 MG/2ML IJ SOLN
4.0000 mg | Freq: Four times a day (QID) | INTRAMUSCULAR | Status: DC | PRN
  Administered 2021-05-02 – 2021-05-06 (×4): 4 mg via INTRAVENOUS
  Filled 2021-05-02 (×4): qty 2

## 2021-05-02 MED ORDER — OSELTAMIVIR PHOSPHATE 75 MG PO CAPS
75.0000 mg | ORAL_CAPSULE | Freq: Two times a day (BID) | ORAL | Status: AC
  Administered 2021-05-02 – 2021-05-06 (×10): 75 mg via ORAL
  Filled 2021-05-02 (×10): qty 1

## 2021-05-02 MED ORDER — SODIUM CHLORIDE 0.9 % IV SOLN
500.0000 mg | Freq: Once | INTRAVENOUS | Status: AC
  Administered 2021-05-02: 500 mg via INTRAVENOUS
  Filled 2021-05-02: qty 500

## 2021-05-02 MED ORDER — ATORVASTATIN CALCIUM 80 MG PO TABS
80.0000 mg | ORAL_TABLET | Freq: Every day | ORAL | Status: DC
  Administered 2021-05-02 – 2021-05-08 (×7): 80 mg via ORAL
  Filled 2021-05-02 (×7): qty 1

## 2021-05-02 MED ORDER — LACTATED RINGERS IV BOLUS: 500.0000 mL | Freq: Once | INTRAVENOUS | Status: DC

## 2021-05-02 MED ORDER — INSULIN ASPART 100 UNIT/ML IJ SOLN
0.0000 [IU] | Freq: Three times a day (TID) | INTRAMUSCULAR | Status: DC
  Administered 2021-05-04: 2 [IU] via SUBCUTANEOUS
  Administered 2021-05-04: 5 [IU] via SUBCUTANEOUS
  Administered 2021-05-04: 2 [IU] via SUBCUTANEOUS
  Administered 2021-05-05: 5 [IU] via SUBCUTANEOUS
  Administered 2021-05-05 (×2): 3 [IU] via SUBCUTANEOUS
  Administered 2021-05-06: 8 [IU] via SUBCUTANEOUS
  Administered 2021-05-06 – 2021-05-07 (×3): 3 [IU] via SUBCUTANEOUS
  Administered 2021-05-07 – 2021-05-08 (×4): 5 [IU] via SUBCUTANEOUS

## 2021-05-02 MED ORDER — CARVEDILOL 12.5 MG PO TABS
25.0000 mg | ORAL_TABLET | Freq: Two times a day (BID) | ORAL | Status: DC
  Administered 2021-05-02 – 2021-05-08 (×12): 25 mg via ORAL
  Filled 2021-05-02 (×6): qty 2
  Filled 2021-05-02: qty 8
  Filled 2021-05-02 (×5): qty 2

## 2021-05-02 MED ORDER — PROMETHAZINE HCL 25 MG PO TABS
25.0000 mg | ORAL_TABLET | Freq: Four times a day (QID) | ORAL | Status: DC | PRN
  Administered 2021-05-04: 25 mg via ORAL
  Filled 2021-05-02 (×2): qty 1

## 2021-05-02 MED ORDER — METOCLOPRAMIDE HCL 5 MG/ML IJ SOLN
10.0000 mg | Freq: Once | INTRAMUSCULAR | Status: AC
  Administered 2021-05-02: 10 mg via INTRAVENOUS
  Filled 2021-05-02: qty 2

## 2021-05-02 MED ORDER — INSULIN ASPART 100 UNIT/ML IJ SOLN
0.0000 [IU] | Freq: Every day | INTRAMUSCULAR | Status: DC
  Administered 2021-05-06: 2 [IU] via SUBCUTANEOUS

## 2021-05-02 MED ORDER — ONDANSETRON 4 MG PO TBDP
4.0000 mg | ORAL_TABLET | Freq: Once | ORAL | Status: AC
  Administered 2021-05-02: 4 mg via ORAL

## 2021-05-02 MED ORDER — SODIUM CHLORIDE 0.9 % IV SOLN
6.2500 mg | Freq: Four times a day (QID) | INTRAVENOUS | Status: DC | PRN
  Administered 2021-05-02 – 2021-05-04 (×4): 6.25 mg via INTRAVENOUS
  Filled 2021-05-02 (×5): qty 0.25

## 2021-05-02 MED ORDER — ONDANSETRON 4 MG PO TBDP
ORAL_TABLET | ORAL | Status: AC
  Filled 2021-05-02: qty 1

## 2021-05-02 MED ORDER — SODIUM CHLORIDE 0.9 % IV SOLN
1.0000 g | Freq: Once | INTRAVENOUS | Status: AC
  Administered 2021-05-02: 1 g via INTRAVENOUS
  Filled 2021-05-02: qty 10

## 2021-05-02 MED ORDER — ISOSORBIDE DINITRATE 30 MG PO TABS
30.0000 mg | ORAL_TABLET | Freq: Three times a day (TID) | ORAL | Status: DC
  Administered 2021-05-02 – 2021-05-03 (×2): 30 mg via ORAL
  Filled 2021-05-02 (×5): qty 1

## 2021-05-02 MED ORDER — ACETAMINOPHEN 325 MG PO TABS
650.0000 mg | ORAL_TABLET | Freq: Four times a day (QID) | ORAL | Status: DC | PRN
  Administered 2021-05-03 – 2021-05-05 (×4): 650 mg via ORAL
  Filled 2021-05-02 (×5): qty 2

## 2021-05-02 MED ORDER — ENOXAPARIN SODIUM 40 MG/0.4ML IJ SOSY
40.0000 mg | PREFILLED_SYRINGE | INTRAMUSCULAR | Status: DC
  Administered 2021-05-02: 40 mg via SUBCUTANEOUS
  Filled 2021-05-02: qty 0.4

## 2021-05-02 MED ORDER — ACETAMINOPHEN 650 MG RE SUPP: 650.0000 mg | Freq: Four times a day (QID) | RECTAL | Status: DC | PRN

## 2021-05-02 NOTE — ED Notes (Signed)
Pt c/o nausea again. Spitting up a bunch of spit. I ordered phenergan from pharmacy. Pt aware that's what we're waiting on. Pt given more ice water. Denies further needs.

## 2021-05-02 NOTE — ED Notes (Signed)
Pt appears to be asleep

## 2021-05-02 NOTE — Telephone Encounter (Signed)
Called and spoke to pts husband, he stated he had unplugged it to get it to stop and when he re-plugged it in the beeping was gone.

## 2021-05-02 NOTE — ED Provider Notes (Signed)
Emergency Medicine Provider Triage Evaluation Note  Linda Lowe , a 70 y.o. female  was evaluated in triage.  Pt complains of feeling sick x3 days with nausea/vomiting.  Recent travel to [place]  Negative covid test 2 days ago.  Does have hx of DM.  Review of Systems  Positive: Nausea, vomiting Negative: fever  Physical Exam  Ht 5\' 3"  (1.6 m)   Wt 63.7 kg   BMI 24.88 kg/m  Gen:   Awake, no distress   Resp:  Normal effort  MSK:   Moves extremities without difficulty  Other:  Vomiting in triage, diabetes monitor right arm  Medical Decision Making  Medically screening exam initiated at 2:15 AM.  Appropriate orders placed.  Linda Lowe was informed that the remainder of the evaluation will be completed by another provider, this initial triage assessment does not replace that evaluation, and the importance of remaining in the ED until their evaluation is complete.  CBG 154.  Dry heaving in triage.  Given zofran, will obtain labs.   , PA-C 05/02/21 05/04/21, DO 05/02/21 [phone]

## 2021-05-02 NOTE — ED Provider Notes (Addendum)
[facility] EMERGENCY DEPARTMENT Provider Note   CSN: [id number] Arrival date & time: 05/02/21  0211     History Chief Complaint  Patient presents with  . Nausea  . Emesis    Linda Lowe is a 70 y.o. female.  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 She returned to [place] on  08/20/2019.  She was again back in [place] for the past 12 months and returned 04/26/21 and is now wheelchair bound since her last trip back home and 2 weeks ago had CHF exacerbation but has started to improve and saw her cardiologist at [facility] cardiology on the 12th the day after she returned but presents today because that in the last 3 days she has had nausea and vomiting as well as a cough.  She reports she cannot hold anything down and based on phone notes yesterday her pacemaker has been beeping repeatedly.  Her device was interrogated while in the cardiology office on 04/27/2021 and at that time it was working appropriately.  Patient denies any productive cough and has not been aware if she has been running a fever.  She has had no diarrhea and had last normal bowel movement yesterday.  She denies any urinary symptoms.  She does complain of shortness of breath with exertion but it is better than it has been and feels like the swelling in her lower extremities has improved.  The history is provided by the patient and medical records.  Emesis     Past Medical History:  Diagnosis Date  . AICD (automatic cardioverter/defibrillator) present    St Jude   . Arthritis   . Cardiomyopathy (HCC)    a. 05/2012 Echo: EF 30-35%, Gr 1 DD, mild LVH, mild MR, pericardial effusion  . Chronic systolic heart failure (HCC) 08/26/2013  . Diabetes mellitus    a. Dx  > 5 y ago  . Dyspnea   . Dysrhythmia   . Hypercholesteremia   . Hypertension   . ICD Biventricular implantable cardioverter-defibrillator (ICD) in situ [employer] [phone] Quadra Assura 11/26/2013    St Jude   . Pericardial effusion    a. 05/2012 Echo: circumferential pericardial effusion, mostly 77mm, largest pocket posterior - 30mm, mild RA indentation, no RV collapse, no tamponade.  . Stroke (HCC) 04/29/2018   Right hemiparesis in [place]    Patient Active Problem List   Diagnosis Date Noted  . Encounter for management of biventricular implantable cardioverter-defibrillator (ICD) 10/13/2019  . ICD Biventricular implantable cardioverter-defibrillator (ICD) in situ [employer] [phone] Quadra Assura 11/26/2013   . Right patella fracture 07/08/2017  . Hyponatremia 03/22/2017  . SIRS (systemic inflammatory response syndrome) (HCC) 03/22/2017  . Diarrhea 03/22/2017  . Elevated troponin 03/22/2017  . Uncontrolled type 2 diabetes mellitus with hyperglycemia, with long-term current use of insulin (HCC) 09/28/2016  . Nonischemic cardiomyopathy (HCC) 08/26/2013  . Chronic systolic heart failure (HCC) 08/26/2013  . LBBB (left bundle branch block) 08/26/2013  . Hyperlipidemia 06/23/2013  . Essential hypertension 05/23/2012    Past Surgical History:  Procedure Laterality Date  . ABDOMINAL HYSTERECTOMY    . BI-VENTRICULAR IMPLANTABLE CARDIOVERTER DEFIBRILLATOR N/A 12/01/2013   St. Jude BI-VENTRICULAR IMPLANTABLE CARDIOVERTER DEFIBRILLATOR  (CRT-D);  Surgeon: Duke Salvia, MD;   . FOOT SURGERY    .  LEFT AND RIGHT HEART CATHETERIZATION WITH CORONARY ANGIOGRAM  05/26/2012   Procedure: LEFT AND RIGHT HEART CATHETERIZATION WITH CORONARY ANGIOGRAM;  Surgeon: Tonny Bollman, MD;  Location: [facility] CATH LAB;  Service: Cardiovascular;;  . ORIF PATELLA Right 07/08/2017   Procedure: OPEN REDUCTION INTERNAL (ORIF) FIXATION PATELLA;  Surgeon: Sheral Apley, MD;  Location: [facility] OR;  Service:  Orthopedics;  Laterality: Right;     OB History   No obstetric history on file.     Family History  Problem Relation Age of Onset  . Other Other        No history of CAD  . Tuberculosis Father        died when pt was young.  . Stroke Brother   . Stroke Brother     Social History   Tobacco Use  . Smoking status: Former    Types: Cigarettes  . Smokeless tobacco: Never  . Tobacco comments:    Quit "long time ago."  Vaping Use  . Vaping Use: Never used  Substance Use Topics  . Alcohol use: No  . Drug use: No    Home Medications Prior to Admission medications   Medication Sig Start Date End Date Taking? Authorizing Provider  amLODipine (NORVASC) 10 MG tablet TAKE 1 TABLET(10 MG) BY MOUTH DAILY 09/18/20   [name], MD  atorvastatin (LIPITOR) 80 MG tablet TAKE 1 TABLET(80 MG) BY MOUTH DAILY 06/30/20   [name], MD  B-D UF III MINI PEN NEEDLES 31G X 5 MM MISC USE 5 NEEDLES EVERY DAY AS DIRECTED 09/12/17   Reather Littler, MD  carvedilol (COREG) 25 MG tablet Take 25 mg by mouth 2 (two) times daily. 07/15/15   [provider]  furosemide (LASIX) 20 MG tablet Take 20 mg by mouth 2 (two) times daily.     [provider]  glucose blood (CONTOUR NEXT TEST) test strip Use as instructed 10/16/17   Reather Littler, MD  glucose blood (ONETOUCH VERIO) test strip USE TO TEST BLOOD SUGAR 3 TIMES DAILY 11/04/17   Reather Littler, MD  hydrALAZINE (APRESOLINE) 25 MG tablet TAKE 1 TABLET(25 MG) BY MOUTH THREE TIMES DAILY 12/04/20   [name], MD  insulin aspart (NOVOLOG FLEXPEN) 100 UNIT/ML FlexPen ADMINISTER 16 UNITS UNDER THE SKIN TWICE DAILY BEFORE LUNCH AND SUPPER 04/17/17   Reather Littler, MD  Insulin Pen Needle (B-D UF III MINI PEN NEEDLES) 31G X 5 MM MISC USE 5 PEN NEEDLES PER DAY AS INSTRUCTED 10/14/16   Reather Littler, MD  Insulin Pen Needle (B-D UF III MINI PEN NEEDLES) 31G X 5 MM MISC USE 5 NEEDLES EVERY DAY AS DIRECTED 09/12/17   Reather Littler, MD  INVOKAMET [phone] MG TABS TAKE 1  TABLET BY MOUTH TWICE DAILY 10/03/17   Reather Littler, MD  irbesartan (AVAPRO) 300 MG tablet Take 300 mg by mouth daily.    [provider]  isosorbide dinitrate (ISORDIL) 30 MG tablet TAKE 1 TABLET(30 MG) BY MOUTH THREE TIMES DAILY 04/02/21   [name], MD  NOVOLOG FLEXPEN 100 UNIT/ML FlexPen ADMINISTER 16 UNITS UNDER THE SKIN TWICE DAILY BEFORE LUNCH AND SUPPER 07/24/17   Reather Littler, MD  [facility] DELICA LANCETS 33G MISC USE TO TEST BLOOD SUGAR LEVELS THREE TIMES DAILY 09/17/17   Reather Littler, MD  potassium chloride SA (K-DUR,KLOR-CON) 20 MEQ tablet TAKE 1 TABLET(20 MEQ) BY MOUTH DAILY Patient not taking: Reported on 04/25/2021 12/01/16   Reather Littler, MD  VICTOZA 18 MG/3ML SOPN INJECT 1.8 MG UNDER THE SKIN  DAILY 09/17/17   Reather Littler, MD    Allergies    Patient has no known allergies.  Review of Systems   Review of Systems  Gastrointestinal:  Positive for vomiting.  All other systems reviewed and are negative.  Physical Exam Updated Vital Signs BP (!) 133/55 (BP Location: Left Arm)   Pulse 74   Temp 99.3 F (37.4 C) (Oral)   Resp (!) 23   Ht 5\' 3"  (1.6 m)   Wt 63.7 kg   SpO2 98%   BMI 24.88 kg/m   Physical Exam Vitals and nursing note reviewed.  Constitutional:      General: She is not in acute distress.    Appearance: She is well-developed. She is ill-appearing.  HENT:     Head: Normocephalic and atraumatic.     Nose: Congestion and rhinorrhea present.     Mouth/Throat:     Mouth: Mucous membranes are moist.  Eyes:     Pupils: Pupils are equal, round, and reactive to light.  Cardiovascular:     Rate and Rhythm: Normal rate and regular rhythm.     Heart sounds: Normal heart sounds. No murmur heard.   No friction rub.  Pulmonary:     Effort: Pulmonary effort is normal. Tachypnea present.     Breath sounds: Examination of the right-lower field reveals rhonchi. Examination of the left-lower field reveals rhonchi. Rhonchi present. No wheezing or rales.     Comments:  Device present in the left upper chest with no erythema or tenderness but small scab present in the left upper portion of the box Abdominal:     General: Bowel sounds are normal. There is no distension.     Palpations: Abdomen is soft.     Tenderness: There is no abdominal tenderness. There is no guarding or rebound.  Musculoskeletal:        General: No tenderness. Normal range of motion.     Cervical back: Normal range of motion and neck supple.     Right lower leg: Edema present.     Left lower leg: Edema present.     Comments: 1+ edema in bilateral ankles and minimal edema noted in the left hand  Skin:    General: Skin is warm and dry.     Coloration: Skin is pale.     Findings: No rash.  Neurological:     Mental Status: She is alert and oriented to person, place, and time.     Cranial Nerves: No cranial nerve deficit.     Comments: Right arm and leg weakness with mild contracture  Psychiatric:        Mood and Affect: Mood normal.        Behavior: Behavior normal.    ED Results / Procedures / Treatments   Labs (all labs ordered are listed, but only abnormal results are displayed) Labs Reviewed  CBC WITH DIFFERENTIAL/PLATELET - Abnormal; Notable for the following components:      Result Value   WBC 12.4 (*)    RBC 2.81 (*)    Hemoglobin 7.4 (*)    HCT 23.4 (*)    RDW 16.5 (*)    Neutro Abs 9.5 (*)    All other components within normal limits  COMPREHENSIVE METABOLIC PANEL - Abnormal; Notable for the following components:   CO2 21 (*)    Glucose, Bld 165 (*)    BUN 44 (*)    Calcium 8.5 (*)    Total Protein 6.4 (*)  Albumin 3.1 (*)    All other components within normal limits  CBG MONITORING, ED - Abnormal; Notable for the following components:   Glucose-Capillary 154 (*)    All other components within normal limits  RESP PANEL BY RT-PCR (FLU A&B, COVID) ARPGX2  LIPASE, BLOOD  URINALYSIS, ROUTINE W REFLEX MICROSCOPIC  PATHOLOGIST SMEAR REVIEW  BRAIN NATRIURETIC  PEPTIDE  TYPE AND SCREEN  TROPONIN I (HIGH SENSITIVITY)    EKG None  Radiology DG Chest Port 1 View  Result Date: 05/02/2021 CLINICAL DATA:  Cough EXAM: PORTABLE CHEST 1 VIEW COMPARISON:  Chest radiograph 09/23/2019 FINDINGS: A left chest wall cardiac device and 3 associated leads are stable. The heart is enlarged, unchanged. The mediastinal contours are within normal limits. There is patchy opacity in the left base not seen on the prior study. There is no other focal consolidation. There is no pulmonary edema. There is no pleural effusion or pneumothorax. There is no acute osseous abnormality. IMPRESSION: 1. Patchy opacity in the left base not seen on the prior study could reflect pneumonia in the correct clinical setting. Recommend follow-up PA and lateral radiographs in 6-8 weeks to assess for resolution. 2. Cardiomegaly. Electronically Signed   By: Lesia Hausen M.D.   On: 05/02/2021 08:58    Procedures Procedures   Medications Ordered in ED Medications  metoCLOPramide (REGLAN) injection 10 mg (has no administration in time range)  ondansetron (ZOFRAN-ODT) disintegrating tablet 4 mg ( Oral Not Given 05/02/21 0234)    ED Course  I have reviewed the triage vital signs and the nursing notes.  Pertinent labs & imaging results that were available during my care of the patient were reviewed by me and considered in my medical decision making (see chart for details).    MDM Rules/Calculators/A&P                           Patient is a next 70 year old female with multiple medical problems including CVA, cardiomyopathy, pacemaker, recent CHF exacerbation and recent trip back from the [place] 6 days ago who is presenting with recurrent nausea and vomiting.  However on exam patient is also coughing has rhinorrhea and had been complaining of shortness of breath.  She denies any fever but temperature is 99 here.  Concern for COVID, pneumonia, malaria, CHF exacerbation, MI.  Patient has no  abdominal pain on exam but concern for possible hepatitis, cholecystitis pancreatitis. Labs today show a new anemia with a hemoglobin of 7.4 from baseline of 12-13 a year ago prior to her going back to the [place]  Renal function, LFTs, lipase are all within normal limits.  Chest x-ray today shows patchy opacity in the left base not seen in prior studies which could reflect pneumonia given patient's recent travel and symptoms this is highly likely.  Will cover with Rocephin and azithromycin.  COVID testing, troponin, BNP, pacemaker interrogation, UA and blood smear remain pending.  Patient was given Reglan for the nausea however given recent report of having CHF exacerbation and still having some evidence of fluid overload we will hold fluids at this time.  Blood pressure has been stable heart rate normal.  10:34 AM Pacer was interrogated it is working appropriately but there was report of being fluid overloaded for the last few days but some improvement.  BNP and troponin are at baseline.  UA without significant findings.  COVID is still pending.  Will admit for further care.  COVID was positive.  CRITICAL CARE Performed by: Telsa Dillavou Total critical care time: 30 minutes Critical care time was exclusive of separately billable procedures and treating other patients. Critical care was necessary to treat or prevent imminent or life-threatening deterioration. Critical care was time spent personally by me on the following activities: development of treatment plan with patient and/or surrogate as well as nursing, discussions with consultants, evaluation of patient's response to treatment, examination of patient, obtaining history from patient or surrogate, ordering and performing treatments and interventions, ordering and review of laboratory studies, ordering and review of radiographic studies, pulse oximetry and re-evaluation of patient's condition.   MDM   Amount and/or Complexity of  Data Reviewed Clinical lab tests: ordered and reviewed Tests in the radiology section of CPT: ordered and reviewed Tests in the medicine section of CPT: ordered and reviewed Decide to obtain previous medical records or to obtain history from someone other than the patient: yes Obtain history from someone other than the patient: yes Review and summarize past medical records: yes Discuss the patient with other providers: yes Independent visualization of images, tracings, or specimens: yes  Risk of Complications, Morbidity, and/or Mortality Presenting problems: moderate Diagnostic procedures: moderate Management options: moderate  Patient Progress Patient progress: stable   Final Clinical Impression(s) / ED Diagnoses Final diagnoses:  Community acquired pneumonia of left lower lobe of lung  Anemia, unspecified type    Rx / DC Orders ED Discharge Orders     None        Gwyneth Sprout, MD 05/02/21 1036    Gwyneth Sprout, MD 05/21/21 [phone]

## 2021-05-02 NOTE — Progress Notes (Signed)
    Patient is being admitted with flu nausea and vomiting and has several comorbidities.  She has a new significant anemia and some history of rectal bleeding recently.  Dr. Maryfrances Bunnell called me about what the best way to work this up would be.  She certainly has issues that may preclude endoscopic evaluation in the next day or 2 but I think given her overall situation with paralysis and significant decline in hemoglobin and inpatient consultation and probably an inpatient colonoscopy at least will make sense.  This is just too difficult to do and to coordinate expeditiously and appropriately as an outpatient.   I will inform the  GI hospitalist team.  Iva Boop, MD, [facility] 05/02/2021 6:01 PM

## 2021-05-02 NOTE — ED Notes (Signed)
Pt asleep. NAD noted. 

## 2021-05-02 NOTE — ED Notes (Addendum)
Introduced myself to pt. Oriented x3. Disoriented to time. Alert. C/o nausea, but better after I gave the zofran. Slightly contracted on R side. Hooked up to purewick. Sitting up on side of bed. Given water and a box of tissues. Denies further needs.

## 2021-05-02 NOTE — Care Plan (Signed)
Able to reach husband by phone.  He was able to confirm what the patient could not, which is that she is a DNR, given her stroke and heart failure and wheelchair status.  He also reported she has had a few bright red bloody bowel movements last week.  Given that, spoke to GI, who will evaluate the patient in the morning for consideration of endoscopy in the hospital.  Given her hemiparesis, debility and use of wheelchair now, this is reasonable.  Trend Hgb, continue Tamiflu.

## 2021-05-02 NOTE — ED Triage Notes (Signed)
Brought in by [facility] from home - c/o feeling sick x 3 days. Nausea and vomiting  since yesterday. Recent travel from [place]

## 2021-05-02 NOTE — ED Notes (Signed)
BGL 154mg /dl at this time, PA sanders aware.

## 2021-05-02 NOTE — H&P (Signed)
History and Physical  Patient Name: Linda Lowe     BMW:[id number]    DOB: 1951/09/10    DOA: 05/02/2021 PCP: Renaye Rakers, MD  Patient coming from: Home  Chief Complaint: Malaise, vomiting      HPI: Linda Lowe is a 70 y.o. F with hx NICM and sCHF EF previously 20-30s now 55%, BiV PPM and ICD, HTN, IDDM, and hx stroke with residual R-sided weakness, now wheelchair bound who presents with 3 days progressive malaise, recurrent vomiting and cough.  Patient has been living in the [place] for the last year, visiting family, no new health changes during that time although she has gotten progressively weaker and is now wheelchair bound.  She returned to the [place] with her husband 6 days ago, and was in her normal state of health at that time until about 3 to 4 days ago when she started to develop malaise, fatigue, nausea, and recurrent vomiting.  She also had a dry cough and subjective fevers.  In the ER, patient had elevated BUN/creatinine ratio, WBC 12 point 4K, normal heart rate and oxygenation.  Chest x-ray showed patchy left base opacity.  COVID test was negative but she tested positive for influenza A.  She was given antiemetics and the hospitalist service were asked to evaluate for intractable vomiting due to influenza.  Of note, incidentally noted to have new anemia.               ROS: Review of Systems  Constitutional:  Positive for chills, fever and malaise/fatigue.  HENT:  Negative for congestion and sore throat.   Respiratory:  Positive for cough. Negative for hemoptysis, sputum production, shortness of breath and wheezing.   Cardiovascular:  Negative for chest pain, palpitations, orthopnea, leg swelling and PND.  Gastrointestinal:  Positive for nausea and vomiting. Negative for abdominal pain, blood in stool, constipation, diarrhea and melena.  Neurological:  Positive for weakness. Negative for dizziness, speech change and focal weakness.         Past Medical  History:  Diagnosis Date   AICD (automatic cardioverter/defibrillator) present    St Jude    Arthritis    Cardiomyopathy (HCC)    a. 05/2012 Echo: EF 30-35%, Gr 1 DD, mild LVH, mild MR, pericardial effusion   Chronic systolic heart failure (HCC) 08/26/2013   Diabetes mellitus    a. Dx > 5 y ago   Dyspnea    Dysrhythmia    Hypercholesteremia    Hypertension    ICD Biventricular implantable cardioverter-defibrillator (ICD) in situ [employer] [phone] [employer] 11/26/2013    St Jude    Pericardial effusion    a. 05/2012 Echo: circumferential pericardial effusion, mostly 10mm, largest pocket posterior - 23mm, mild RA indentation, no RV collapse, no tamponade.   Stroke (HCC) 04/29/2018   Right hemiparesis in [place]    Past Surgical History:  Procedure Laterality Date   ABDOMINAL HYSTERECTOMY     BI-VENTRICULAR IMPLANTABLE CARDIOVERTER DEFIBRILLATOR N/A 12/01/2013   St. Jude BI-VENTRICULAR IMPLANTABLE CARDIOVERTER DEFIBRILLATOR  (CRT-D);  Surgeon: Duke Salvia, MD;    FOOT SURGERY     LEFT AND RIGHT HEART CATHETERIZATION WITH CORONARY ANGIOGRAM  05/26/2012   Procedure: LEFT AND RIGHT HEART CATHETERIZATION WITH CORONARY ANGIOGRAM;  Surgeon: Tonny Bollman, MD;  Location: [facility] CATH LAB;  Service: Cardiovascular;;   ORIF PATELLA Right 07/08/2017   Procedure: OPEN REDUCTION INTERNAL (ORIF) FIXATION PATELLA;  Surgeon: Sheral Apley, MD;  Location: [facility] OR;  Service: Orthopedics;  Laterality: Right;    Social History: Patient lives with her husband.  The patient can stand but is mostly using a wheelchair lately.  Nonsmoker.  No Known Allergies  Family history: family history includes Other in an other family member; Stroke in her brother and brother; Tuberculosis in her father.  Prior to Admission medications   Medication Sig Start Date End Date Taking? Authorizing Provider  amLODipine (NORVASC) 10 MG tablet TAKE 1 TABLET(10 MG) BY MOUTH DAILY Patient taking differently:  Take 10 mg by mouth daily. 09/18/20  Yes [name], MD  atorvastatin (LIPITOR) 80 MG tablet TAKE 1 TABLET(80 MG) BY MOUTH DAILY Patient taking differently: Take 80 mg by mouth daily. 06/30/20  Yes [name], MD  carvedilol (COREG) 25 MG tablet Take 25 mg by mouth 2 (two) times daily. 07/15/15  Yes [provider]  furosemide (LASIX) 20 MG tablet Take 20 mg by mouth 2 (two) times daily.    Yes [provider]  hydrALAZINE (APRESOLINE) 25 MG tablet TAKE 1 TABLET(25 MG) BY MOUTH THREE TIMES DAILY Patient taking differently: Take 25 mg by mouth 3 (three) times daily. 12/04/20  Yes [name], MD  INVOKAMET [phone] MG TABS TAKE 1 TABLET BY MOUTH TWICE DAILY Patient taking differently: Take 1 tablet by mouth in the morning and at bedtime. 10/03/17  Yes Reather Littler, MD  irbesartan (AVAPRO) 300 MG tablet Take 300 mg by mouth daily.   Yes [provider]  isosorbide dinitrate (ISORDIL) 30 MG tablet TAKE 1 TABLET(30 MG) BY MOUTH THREE TIMES DAILY Patient taking differently: Take 30 mg by mouth 3 (three) times daily. 04/02/21  Yes [name], MD  NOVOLOG FLEXPEN 100 UNIT/ML FlexPen ADMINISTER 16 UNITS UNDER THE SKIN TWICE DAILY BEFORE LUNCH AND SUPPER Patient taking differently: Inject 16 Units into the skin 2 (two) times daily with a meal. 07/24/17  Yes Reather Littler, MD  potassium chloride SA (K-DUR,KLOR-CON) 20 MEQ tablet TAKE 1 TABLET(20 MEQ) BY MOUTH DAILY Patient taking differently: Take 20 mEq by mouth daily. 12/01/16  Yes Reather Littler, MD  VICTOZA 18 MG/3ML SOPN INJECT 1.8 MG UNDER THE SKIN DAILY Patient taking differently: Inject 1.8 mg into the skin daily. 09/17/17  Yes Reather Littler, MD  B-D UF III MINI PEN NEEDLES 31G X 5 MM MISC USE 5 NEEDLES EVERY DAY AS DIRECTED 09/12/17   Reather Littler, MD  glucose blood (CONTOUR NEXT TEST) test strip Use as instructed 10/16/17   Reather Littler, MD  glucose blood (ONETOUCH VERIO) test strip USE TO TEST BLOOD SUGAR 3 TIMES DAILY 11/04/17   Reather Littler, MD  Insulin Pen Needle (B-D UF III MINI PEN NEEDLES) 31G X 5 MM MISC USE 5 PEN NEEDLES PER DAY AS INSTRUCTED 10/14/16   Reather Littler, MD  Insulin Pen Needle (B-D UF III MINI PEN NEEDLES) 31G X 5 MM MISC USE 5 NEEDLES EVERY DAY AS DIRECTED 09/12/17   Reather Littler, MD  [facility] DELICA LANCETS 33G MISC USE TO TEST BLOOD SUGAR LEVELS THREE TIMES DAILY 09/17/17   Reather Littler, MD       Physical Exam: BP (!) 145/80   Pulse 81   Temp 99 F (37.2 C) (Oral)   Resp 20   Ht  (1.6 m)   Wt 63.7 kg   SpO2 94%   BMI 24.88 kg/m  General appearance: Elderly adult female, awake but appears uncomfortable and weak.  No obvious distress   Eyes: Anicteric, conjunctiva pink, lids and lashes normal. PERRL.  ENT: No nasal deformity, discharge, epistaxis.  Hearing diminished. OP moist without lesions.   Neck: No neck masses.  Trachea midline.  No thyromegaly/tenderness. Lymph: Scant cervical, no supraclavicular lymphadenopathy. Skin: Warm and dry.  No jaundice.  No suspicious rashes or lesions. Cardiac: RRR, nl S1-S2, no murmurs appreciated.  Capillary refill is brisk.  No JVD, miminal trace pretibial edema.  Radial pulses 2+ and symmetric. Respiratory: Normal respiratory rate and rhythm.  CTAB without rales or wheezes. Abdomen: Abdomen soft.  No TTP. No ascites, distension, hepatosplenomegaly.   MSK: No deformities or effusions of the large joints of the upper or lower extremities bilaterally.  No cyanosis or clubbing. Neuro: Cranial nerves 3-12 intact.  Sensation intact to light touch. Speech is fluent.  Muscle strength 5-/5 on left, weak and contractured on the left upper, weak on the left lower.    Psych: Sensorium intact and responding to questions, appears tired, attention  normal.  Behavior appropriate.  Affect blunted.  Judgment and insight appear normal.     Labs on Admission:  I have personally reviewed following labs and imaging studies: CBC: Recent Labs  Lab 05/02/21 0226  WBC 12.4*   NEUTROABS 9.5*  HGB 7.4*  HCT 23.4*  MCV 83.3  PLT 328   Basic Metabolic Panel: Recent Labs  Lab 05/02/21 0226  NA 138  K 3.5  CL 107  CO2 21*  GLUCOSE 165*  BUN 44*  CREATININE 0.74  CALCIUM 8.5*   GFR: Estimated Creatinine Clearance: 60.5 mL/min (by C-G formula based on SCr of 0.74 mg/dL).  Liver Function Tests: Recent Labs  Lab 05/02/21 0226  AST 30  ALT 24  ALKPHOS 77  BILITOT 0.7  PROT 6.4*  ALBUMIN 3.1*   Recent Labs  Lab 05/02/21 0226  LIPASE 26    CBG: Recent Labs  Lab 05/02/21 0217  GLUCAP 154*     Recent Results (from the past 240 hour(s))  Resp Panel by RT-PCR (Flu A&B, Covid) Nasopharyngeal Swab     Status: Abnormal   Collection Time: 05/02/21  9:54 AM   Specimen: Nasopharyngeal Swab; Nasopharyngeal(NP) swabs in vial transport medium  Result Value Ref Range Status   SARS Coronavirus 2 by RT PCR NEGATIVE NEGATIVE Final    Comment: (NOTE) SARS-CoV-2 target nucleic acids are NOT DETECTED.  The SARS-CoV-2 RNA is generally detectable in upper respiratory specimens during the acute phase of infection. The lowest concentration of SARS-CoV-2 viral copies this assay can detect is 138 copies/mL. A negative result does not preclude SARS-Cov-2 infection and should not be used as the sole basis for treatment or other patient management decisions. A negative result may occur with  improper specimen collection/handling, submission of specimen other than nasopharyngeal swab, presence of viral mutation(s) within the areas targeted by this assay, and inadequate number of viral copies(<138 copies/mL). A negative result must be combined with clinical observations, patient history, and epidemiological information. The expected result is Negative.  Fact Sheet for Patients:  BloggerCourse.com  Fact Sheet for Healthcare Providers:  SeriousBroker.it  This test is no t yet approved or cleared by the [place] FDA and  has been authorized for detection and/or diagnosis of SARS-CoV-2 by FDA under an Emergency Use Authorization (EUA). This EUA will remain  in effect (meaning this test can be used) for the duration of the COVID-19 declaration under Section 564(b)(1) of the Act, 21 U.S.C.section 360bbb-3(b)(1), unless the authorization is terminated  or revoked sooner.       Influenza A  by PCR POSITIVE (A) NEGATIVE Final   Influenza B by PCR NEGATIVE NEGATIVE Final    Comment: (NOTE) The Xpert Xpress SARS-CoV-2/FLU/RSV plus assay is intended as an aid in the diagnosis of influenza from Nasopharyngeal swab specimens and should not be used as a sole basis for treatment. Nasal washings and aspirates are unacceptable for Xpert Xpress SARS-CoV-2/FLU/RSV testing.  Fact Sheet for Patients: BloggerCourse.com  Fact Sheet for Healthcare Providers: SeriousBroker.it  This test is not yet approved or cleared by the [place] FDA and has been authorized for detection and/or diagnosis of SARS-CoV-2 by FDA under an Emergency Use Authorization (EUA). This EUA will remain in effect (meaning this test can be used) for the duration of the COVID-19 declaration under Section 564(b)(1) of the Act, 21 U.S.C. section 360bbb-3(b)(1), unless the authorization is terminated or revoked.  Performed at [facility], [address], [place], [place] [address]            Radiological Exams on Admission: Personally reviewed CXR shows slight LLL opacity: DG Chest Port 1 View  Result Date: 05/02/2021 CLINICAL DATA:  Cough EXAM: PORTABLE CHEST 1 VIEW COMPARISON:  Chest radiograph 09/23/2019 FINDINGS: A left chest wall cardiac device and 3 associated leads are stable. The heart is enlarged, unchanged. The mediastinal contours are within normal limits. There is patchy opacity in the left base not seen on the prior study. There is no other focal  consolidation. There is no pulmonary edema. There is no pleural effusion or pneumothorax. There is no acute osseous abnormality. IMPRESSION: 1. Patchy opacity in the left base not seen on the prior study could reflect pneumonia in the correct clinical setting. Recommend follow-up PA and lateral radiographs in 6-8 weeks to assess for resolution. 2. Cardiomegaly. Electronically Signed   By: Lesia Hausen M.D.   On: 05/02/2021 08:58     PPM interrogation with no events, only increased optivol.      Assessment/Plan   Intractable nausea and vomiting due to influenza A Dehydration Sepsis ruled out.  Thin smear sent to pathology to rule out malaria before flu test resulted.  -Start Tamiflu - Oral rehydration - IV Zofran or Phenergan as needed - Hold antibiotics for now and follow procalcitonin - Follow thin smear   Chronic systolic CHF Nonischemic cardiomyopathy Appears dehydrated -Hold Lasix and Invokana for today given she appears dehydrated on exam -Continue carvedilol, atorvastatin, and Isordil -Hold irbesartan, and hydralazine, and amlodipine given low diastolic pressure/dehydration   Anemia Borderline macrocytic, with increased RDW.  Normal bilirubin and no hemoglobin on urine dip militates against hemolysis. - Check iron stores - Check LDH, coombs - Outpatient colonoscopy necessary   History of pacemaker There were reports of pacemaker "beeping" at home, although pacemaker interrogation is benign, and I see telephone note now that that has been unplugged and plugged back in the pacemaker and the beeping stopped.   Type 2 diabetes, without complication, with long-term insulin use -Hold Victoza, Invokana given vomiting/dehydration - Hold home NovoLog given twice daily - Sliding scale corrections  History of stroke with residual right-sided weakness - Continue statin      DVT prophylaxis: Lovenox  Code Status: FULL  Family Communication: Attempted call to husband  3x, no answer  Disposition Plan: Anticipate start Tamiflu and observe overnight, if nausea improves and able to advance diet tomorrow, home Consults called: None Admission status: OBS   At the point of initial evaluation, it is my clinical opinion that admission for OBSERVATION is reasonable  and necessary because the patient's presenting complaints in the context of their chronic conditions represent sufficient risk of deterioration or significant morbidity to constitute reasonable grounds for close observation in the hospital setting, but that the patient may be medically stable for discharge from the hospital within 24 to 48 hours.    Medical decision making: Patient seen at 2:13 PM on 05/02/2021.  The patient was discussed with Dr. Anitra Lauth.  What exists of the patient's chart was reviewed in depth and summarized above.  Clinical condition: Hemodynamically stable, mentation good.        Earl Lites Theola Cuellar [facility] Please page though AMION or Epic secure chat:  For password, contact charge nurse

## 2021-05-03 ENCOUNTER — Other Ambulatory Visit: Payer: Medicare Other

## 2021-05-03 ENCOUNTER — Ambulatory Visit: Payer: Medicare Other | Admitting: Cardiology

## 2021-05-03 DIAGNOSIS — Z95810 Presence of automatic (implantable) cardiac defibrillator: Secondary | ICD-10-CM | POA: Diagnosis not present

## 2021-05-03 DIAGNOSIS — K449 Diaphragmatic hernia without obstruction or gangrene: Secondary | ICD-10-CM | POA: Diagnosis not present

## 2021-05-03 DIAGNOSIS — D509 Iron deficiency anemia, unspecified: Secondary | ICD-10-CM | POA: Diagnosis present

## 2021-05-03 DIAGNOSIS — D649 Anemia, unspecified: Secondary | ICD-10-CM | POA: Diagnosis not present

## 2021-05-03 DIAGNOSIS — K648 Other hemorrhoids: Secondary | ICD-10-CM | POA: Diagnosis present

## 2021-05-03 DIAGNOSIS — Z823 Family history of stroke: Secondary | ICD-10-CM | POA: Diagnosis not present

## 2021-05-03 DIAGNOSIS — Z90710 Acquired absence of both cervix and uterus: Secondary | ICD-10-CM | POA: Diagnosis not present

## 2021-05-03 DIAGNOSIS — I5020 Unspecified systolic (congestive) heart failure: Secondary | ICD-10-CM | POA: Diagnosis not present

## 2021-05-03 DIAGNOSIS — R531 Weakness: Secondary | ICD-10-CM | POA: Diagnosis not present

## 2021-05-03 DIAGNOSIS — L929 Granulomatous disorder of the skin and subcutaneous tissue, unspecified: Secondary | ICD-10-CM | POA: Diagnosis not present

## 2021-05-03 DIAGNOSIS — Z993 Dependence on wheelchair: Secondary | ICD-10-CM | POA: Diagnosis not present

## 2021-05-03 DIAGNOSIS — Z831 Family history of other infectious and parasitic diseases: Secondary | ICD-10-CM | POA: Diagnosis not present

## 2021-05-03 DIAGNOSIS — I5022 Chronic systolic (congestive) heart failure: Secondary | ICD-10-CM | POA: Diagnosis present

## 2021-05-03 DIAGNOSIS — E7800 Pure hypercholesterolemia, unspecified: Secondary | ICD-10-CM | POA: Diagnosis present

## 2021-05-03 DIAGNOSIS — K269 Duodenal ulcer, unspecified as acute or chronic, without hemorrhage or perforation: Secondary | ICD-10-CM | POA: Diagnosis present

## 2021-05-03 DIAGNOSIS — I69351 Hemiplegia and hemiparesis following cerebral infarction affecting right dominant side: Secondary | ICD-10-CM | POA: Diagnosis not present

## 2021-05-03 DIAGNOSIS — J101 Influenza due to other identified influenza virus with other respiratory manifestations: Secondary | ICD-10-CM | POA: Diagnosis not present

## 2021-05-03 DIAGNOSIS — D500 Iron deficiency anemia secondary to blood loss (chronic): Secondary | ICD-10-CM | POA: Diagnosis not present

## 2021-05-03 DIAGNOSIS — K5730 Diverticulosis of large intestine without perforation or abscess without bleeding: Secondary | ICD-10-CM | POA: Diagnosis present

## 2021-05-03 DIAGNOSIS — E860 Dehydration: Secondary | ICD-10-CM | POA: Diagnosis present

## 2021-05-03 DIAGNOSIS — K264 Chronic or unspecified duodenal ulcer with hemorrhage: Secondary | ICD-10-CM | POA: Diagnosis present

## 2021-05-03 DIAGNOSIS — R197 Diarrhea, unspecified: Secondary | ICD-10-CM | POA: Diagnosis not present

## 2021-05-03 DIAGNOSIS — R112 Nausea with vomiting, unspecified: Secondary | ICD-10-CM | POA: Diagnosis not present

## 2021-05-03 DIAGNOSIS — J1000 Influenza due to other identified influenza virus with unspecified type of pneumonia: Secondary | ICD-10-CM | POA: Diagnosis present

## 2021-05-03 DIAGNOSIS — D62 Acute posthemorrhagic anemia: Secondary | ICD-10-CM | POA: Diagnosis present

## 2021-05-03 DIAGNOSIS — I110 Hypertensive heart disease with heart failure: Secondary | ICD-10-CM | POA: Diagnosis present

## 2021-05-03 DIAGNOSIS — K254 Chronic or unspecified gastric ulcer with hemorrhage: Secondary | ICD-10-CM | POA: Diagnosis present

## 2021-05-03 DIAGNOSIS — R5381 Other malaise: Secondary | ICD-10-CM | POA: Diagnosis not present

## 2021-05-03 DIAGNOSIS — K259 Gastric ulcer, unspecified as acute or chronic, without hemorrhage or perforation: Secondary | ICD-10-CM | POA: Diagnosis not present

## 2021-05-03 DIAGNOSIS — I420 Dilated cardiomyopathy: Secondary | ICD-10-CM | POA: Diagnosis present

## 2021-05-03 DIAGNOSIS — K644 Residual hemorrhoidal skin tags: Secondary | ICD-10-CM | POA: Diagnosis present

## 2021-05-03 DIAGNOSIS — Z20822 Contact with and (suspected) exposure to covid-19: Secondary | ICD-10-CM | POA: Diagnosis present

## 2021-05-03 DIAGNOSIS — Z66 Do not resuscitate: Secondary | ICD-10-CM | POA: Diagnosis present

## 2021-05-03 DIAGNOSIS — J189 Pneumonia, unspecified organism: Secondary | ICD-10-CM | POA: Diagnosis not present

## 2021-05-03 DIAGNOSIS — E1165 Type 2 diabetes mellitus with hyperglycemia: Secondary | ICD-10-CM | POA: Diagnosis not present

## 2021-05-03 DIAGNOSIS — Z7401 Bed confinement status: Secondary | ICD-10-CM | POA: Diagnosis not present

## 2021-05-03 DIAGNOSIS — Z87891 Personal history of nicotine dependence: Secondary | ICD-10-CM | POA: Diagnosis not present

## 2021-05-03 LAB — PREPARE RBC (CROSSMATCH)

## 2021-05-03 LAB — BASIC METABOLIC PANEL
Anion gap: 9 (ref 5–15)
BUN: 24 mg/dL — ABNORMAL HIGH (ref 8–23)
CO2: 23 mmol/L (ref 22–32)
Calcium: 7.8 mg/dL — ABNORMAL LOW (ref 8.9–10.3)
Chloride: 102 mmol/L (ref 98–111)
Creatinine, Ser: 0.63 mg/dL (ref 0.44–1.00)
GFR, Estimated: >60 mL/min (ref >60)
Glucose, Bld: 104 mg/dL — ABNORMAL HIGH (ref 70–99)
Potassium: 3.8 mmol/L (ref 3.5–5.1)
Sodium: 134 mmol/L — ABNORMAL LOW (ref 135–145)

## 2021-05-03 LAB — GLUCOSE, CAPILLARY
Glucose-Capillary: 87 mg/dL (ref 70–99)
Glucose-Capillary: 106 mg/dL — ABNORMAL HIGH (ref 70–99)
Glucose-Capillary: 83 mg/dL (ref 70–99)
Glucose-Capillary: 70 mg/dL (ref 70–99)

## 2021-05-03 LAB — CBC
HCT: 19.5 % — ABNORMAL LOW (ref 36.0–46.0)
Hemoglobin: 6 g/dL — CL (ref 12.0–15.0)
MCH: 26 pg (ref 26.0–34.0)
MCHC: 30.8 g/dL (ref 30.0–36.0)
MCV: 84.4 fL (ref 80.0–100.0)
Platelets: 275 10*3/uL (ref 150–400)
RBC: 2.31 MIL/uL — ABNORMAL LOW (ref 3.87–5.11)
RDW: 16.7 % — ABNORMAL HIGH (ref 11.5–15.5)
WBC: 10.8 10*3/uL — ABNORMAL HIGH (ref 4.0–10.5)
nRBC: 0.2 % (ref 0.0–0.2)

## 2021-05-03 LAB — HIV ANTIBODY (ROUTINE TESTING W REFLEX): HIV Screen 4th Generation wRfx: NONREACTIVE

## 2021-05-03 LAB — HEMOGLOBIN AND HEMATOCRIT, BLOOD
HCT: 22.9 % — ABNORMAL LOW (ref 36.0–46.0)
Hemoglobin: 7.4 g/dL — ABNORMAL LOW (ref 12.0–15.0)

## 2021-05-03 LAB — HEMOGLOBIN A1C
Hgb A1c MFr Bld: 8 % — ABNORMAL HIGH (ref 4.8–5.6)
Mean Plasma Glucose: 182.9 mg/dL

## 2021-05-03 LAB — PROCALCITONIN: Procalcitonin: <0.1 ng/mL

## 2021-05-03 MED ORDER — SODIUM CHLORIDE 0.9 % IV SOLN
250.0000 mg | Freq: Every day | INTRAVENOUS | Status: AC
  Administered 2021-05-03 – 2021-05-04 (×2): 250 mg via INTRAVENOUS
  Filled 2021-05-03 (×4): qty 20

## 2021-05-03 MED ORDER — PANTOPRAZOLE SODIUM 40 MG IV SOLR
40.0000 mg | Freq: Two times a day (BID) | INTRAVENOUS | Status: DC
  Administered 2021-05-03 (×2): 40 mg via INTRAVENOUS
  Filled 2021-05-03 (×2): qty 40

## 2021-05-03 MED ORDER — SODIUM CHLORIDE 0.9% IV SOLUTION: Freq: Once | INTRAVENOUS | Status: DC

## 2021-05-03 MED ORDER — MORPHINE SULFATE (PF) 2 MG/ML IV SOLN
2.0000 mg | INTRAVENOUS | Status: DC | PRN
  Administered 2021-05-03 – 2021-05-06 (×4): 2 mg via INTRAVENOUS
  Filled 2021-05-03 (×4): qty 1

## 2021-05-03 NOTE — ED Notes (Signed)
Pt woke up and asked for coffee. Denies further needs.

## 2021-05-03 NOTE — ED Notes (Signed)
Pt appears to be asleep. NAD noted. 

## 2021-05-03 NOTE — Progress Notes (Signed)
[facility] PROGRESS NOTE    Linda Lowe  UJW:[id number] DOB: 1950-11-09 DOA: 05/02/2021 PCP: Renaye Rakers, MD      Brief Narrative:  Linda Lowe is a 70 y.o. F with NICM and sCHF EF previously 20-30s now 55%, BiV PPM and ICD, HTN, IDDM, and hx stroke with residual R-sided weakness, now wheelchair bound who presented with 3 days progressive malaise, recurrent vomiting and cough.   In the ER, found to have influenza.  Dehydrated, vomiting.   Of note, incidentally noted to have new anemia.  Husband confirmed BRBPR.       Assessment & Plan:  Acute blood loss anemia Rectal bleeding No BM or rectal bleeding/melena overnight.  Hgb however down from 7.4 > 6.0 g/dL Iron stores low, clearly some component of this is not new.    - Transfuse 1 unit now - Repeat H/H after and transfuse with target >7 g/dL at least - Consult GI, appreciate cares - Continue IV iron  - Given elevated BUN on admission, I will start a PPI, defer continuation to GI     Influenza LLL pneumonia due to influenza Intractable nausea and vomiting due to influenza Dehydration Mild.  IMproving. Now appears to have resolved nausea, BUN down I think she is drinking well and hydrating orally - Continue Tamiflu day 2 of 5 - Hold antibiotics  Chronic systolic CHF Nonischemic cardiomyopathy History of pacemaker No fluid overload Blood pressure soft -Hold furosemide and Invokana until clearly hydrating well - Continue carvedilol, atorvastatin -Hold Isordil, amlodipine, irbesartan seen given soft BP  Type 2 diabetes without complication with long-term use of insulin Glucoses low -Hold Invokana, Victoza given vomiting/dehydration - Hold home twice daily NovoLog - Continue sliding scale corrections  Cerebrovascular disease Residual right-sided weakness Hemiparesis, wheelchair bound mostly -Continue atorvastatin          Disposition: Status is: Inpatient  Remains inpatient  appropriate because: requires transfusion, BP is 90s and low 100s overnight (>40 pts lower than her baseline)  Dispo: The patient is from: Home              Anticipated d/c is to:  TBD              Patient currently is not medically stable to d/c.   Difficult to place patient No    The patient was admitted for influenza and intractable vomiting.  Found to have drop in hemoglobin overnight, soft blood pressure.  She will need GI consultation, transfusion.  May need several more days hospitalization if her hemoglobin continues to drop, or she needs endoscopy from GI.   Level of care: Med-Surg       MDM: The below labs and imaging reports were reviewed and summarized above.  Medication management as above.     DVT prophylaxis: Place and maintain sequential compression device Start: 05/03/21 0716  Code Status: DNR Family Communication: We will update husband after GI evaluation    Consultants:  Gastroenterology, Dr. Merwyn Katos  Procedures:    Antimicrobials:  Ceftriaxone and azithromycin x1 8/17  Culture data:             Subjective: Patient feels very tired.  She is quite sleepy but oriented to situation.  No melena or hematochezia overnight.  She still has a little bit of a cough.  Her nausea is better.  No chest pain or dyspnea.  No abdominal pain      Objective: Vitals:   05/03/21 0653 05/03/21 0800 05/03/21 1110 05/03/21  1144  BP: (!) 134/57 (!) 129/55 (!) 107/45   Pulse: 68 62 63 63  Resp: 17 18 19 20   Temp: 97.7 F (36.5 C) 98.6 F (37 C) 99.5 F (37.5 C) 99.1 F (37.3 C)  TempSrc: Oral Oral Oral Oral  SpO2: 97% 99% 95% 95%  Weight:      Height:        Intake/Output Summary (Last 24 hours) at 05/03/2021 1258 Last data filed at 05/02/2021 2219 Gross per 24 hour  Intake 50 ml  Output --  Net 50 ml   Filed Weights   05/02/21 0215  Weight: 63.7 kg    Examination: General appearance: Elderly adult female, alert and in no obvious  distress.  Very tired, appears chronically ill. HEENT: Anicteric, conjunctiva pink, lids and lashes normal. No nasal deformity, discharge, epistaxis.  Lips moist, edentulous, oropharynx moist, no oral lesions, hearing diminished.   Skin: Warm and dry.  Pale.  No jaundice.  No suspicious rashes or lesions. Cardiac: RRR, nl S1-S2, no murmurs appreciated.  No LE edema.  Radial pulses 2+ and symmetric. Respiratory: Normal respiratory rate and rhythm.  Rhonchi at both bases, no wheezing. Abdomen: Abdomen soft.  No TTP or guarding. No ascites, distension, hepatosplenomegaly.   MSK: No deformities or effusions. Neuro: Awake and alert.  EOMI, right-sided strength with generalized weakness, left-sided hemiparesis is chronic with some contracture. Speech somewhat slow but fluent.    Psych: Sensorium intact and responding to questions, sleepy, attention somewhat distracted, affect blunted, judgment insight appear slightly impaired.       Data Reviewed: I have personally reviewed following labs and imaging studies:  CBC: Recent Labs  Lab 05/02/21 0226 05/03/21 0337  WBC 12.4* 10.8*  NEUTROABS 9.5*  --   HGB 7.4* 6.0*  HCT 23.4* 19.5*  MCV 83.3 84.4  PLT 328 275   Basic Metabolic Panel: Recent Labs  Lab 05/02/21 0226 05/03/21 0337  NA 138 134*  K 3.5 3.8  CL 107 102  CO2 21* 23  GLUCOSE 165* 104*  BUN 44* 24*  CREATININE 0.74 0.63  CALCIUM 8.5* 7.8*   GFR: Estimated Creatinine Clearance: 60.5 mL/min (by C-G formula based on SCr of 0.63 mg/dL). Liver Function Tests: Recent Labs  Lab 05/02/21 0226  AST 30  ALT 24  ALKPHOS 77  BILITOT 0.7  PROT 6.4*  ALBUMIN 3.1*   Recent Labs  Lab 05/02/21 0226  LIPASE 26   No results for input(s): AMMONIA in the last 168 hours. Coagulation Profile: No results for input(s): INR, PROTIME in the last 168 hours. Cardiac Enzymes: No results for input(s): CKTOTAL, CKMB, CKMBINDEX, TROPONINI in the last 168 hours. BNP (last 3 results) No  results for input(s): PROBNP in the last 8760 hours. HbA1C: Recent Labs    05/03/21 0338  HGBA1C 8.0*   CBG: Recent Labs  Lab 05/02/21 0217 05/02/21 2125 05/03/21 0658  GLUCAP 154* 98 87   Lipid Profile: No results for input(s): CHOL, HDL, LDLCALC, TRIG, CHOLHDL, LDLDIRECT in the last 72 hours. Thyroid Function Tests: No results for input(s): TSH, T4TOTAL, FREET4, T3FREE, THYROIDAB in the last 72 hours. Anemia Panel: Recent Labs    05/02/21 1835  FERRITIN 18  TIBC 300  IRON 15*   Urine analysis:    Component Value Date/Time   COLORURINE YELLOW 05/02/2021 0903   APPEARANCEUR CLEAR 05/02/2021 0903   LABSPEC 1.018 05/02/2021 0903   PHURINE 5.0 05/02/2021 0903   GLUCOSEU >=500 (A) 05/02/2021 0903   GLUCOSEU >=1000 (  A) 11/02/2014 1003   HGBUR NEGATIVE 05/02/2021 0903   BILIRUBINUR NEGATIVE 05/02/2021 0903   KETONESUR 5 (A) 05/02/2021 0903   PROTEINUR NEGATIVE 05/02/2021 0903   UROBILINOGEN 0.2 11/02/2014 1003   NITRITE NEGATIVE 05/02/2021 0903   LEUKOCYTESUR TRACE (A) 05/02/2021 0903   Sepsis Labs: (procalcitonin:4,lacticacidven:4)  ) Recent Results (from the past 240 hour(s))  Resp Panel by RT-PCR (Flu A&B, Covid) Nasopharyngeal Swab     Status: Abnormal   Collection Time: 05/02/21  9:54 AM   Specimen: Nasopharyngeal Swab; Nasopharyngeal(NP) swabs in vial transport medium  Result Value Ref Range Status   SARS Coronavirus 2 by RT PCR NEGATIVE NEGATIVE Final    Comment: (NOTE) SARS-CoV-2 target nucleic acids are NOT DETECTED.  The SARS-CoV-2 RNA is generally detectable in upper respiratory specimens during the acute phase of infection. The lowest concentration of SARS-CoV-2 viral copies this assay can detect is 138 copies/mL. A negative result does not preclude SARS-Cov-2 infection and should not be used as the sole basis for treatment or other patient management decisions. A negative result may occur with  improper specimen collection/handling,  submission of specimen other than nasopharyngeal swab, presence of viral mutation(s) within the areas targeted by this assay, and inadequate number of viral copies(<138 copies/mL). A negative result must be combined with clinical observations, patient history, and epidemiological information. The expected result is Negative.  Fact Sheet for Patients:  BloggerCourse.com  Fact Sheet for Healthcare Providers:  SeriousBroker.it  This test is no t yet approved or cleared by the [place] FDA and  has been authorized for detection and/or diagnosis of SARS-CoV-2 by FDA under an Emergency Use Authorization (EUA). This EUA will remain  in effect (meaning this test can be used) for the duration of the COVID-19 declaration under Section 564(b)(1) of the Act, 21 U.S.C.section 360bbb-3(b)(1), unless the authorization is terminated  or revoked sooner.       Influenza A by PCR POSITIVE (A) NEGATIVE Final   Influenza B by PCR NEGATIVE NEGATIVE Final    Comment: (NOTE) The Xpert Xpress SARS-CoV-2/FLU/RSV plus assay is intended as an aid in the diagnosis of influenza from Nasopharyngeal swab specimens and should not be used as a sole basis for treatment. Nasal washings and aspirates are unacceptable for Xpert Xpress SARS-CoV-2/FLU/RSV testing.  Fact Sheet for Patients: BloggerCourse.com  Fact Sheet for Healthcare Providers: SeriousBroker.it  This test is not yet approved or cleared by the [place] FDA and has been authorized for detection and/or diagnosis of SARS-CoV-2 by FDA under an Emergency Use Authorization (EUA). This EUA will remain in effect (meaning this test can be used) for the duration of the COVID-19 declaration under Section 564(b)(1) of the Act, 21 U.S.C. section 360bbb-3(b)(1), unless the authorization is terminated or revoked.  Performed at [facility],  [address], [place], [place] [address]   Resp Panel by RT-PCR (Flu A&B, Covid) Nasopharyngeal Swab     Status: Abnormal   Collection Time: 05/02/21  2:50 PM   Specimen: Nasopharyngeal Swab; Nasopharyngeal(NP) swabs in vial transport medium  Result Value Ref Range Status   SARS Coronavirus 2 by RT PCR NEGATIVE NEGATIVE Final    Comment: (NOTE) SARS-CoV-2 target nucleic acids are NOT DETECTED.  The SARS-CoV-2 RNA is generally detectable in upper respiratory specimens during the acute phase of infection. The lowest concentration of SARS-CoV-2 viral copies this assay can detect is 138 copies/mL. A negative result does not preclude SARS-Cov-2 infection and should not be used as the sole  basis for treatment or other patient management decisions. A negative result may occur with  improper specimen collection/handling, submission of specimen other than nasopharyngeal swab, presence of viral mutation(s) within the areas targeted by this assay, and inadequate number of viral copies(<138 copies/mL). A negative result must be combined with clinical observations, patient history, and epidemiological information. The expected result is Negative.  Fact Sheet for Patients:  BloggerCourse.com  Fact Sheet for Healthcare Providers:  SeriousBroker.it  This test is no t yet approved or cleared by the [place] FDA and  has been authorized for detection and/or diagnosis of SARS-CoV-2 by FDA under an Emergency Use Authorization (EUA). This EUA will remain  in effect (meaning this test can be used) for the duration of the COVID-19 declaration under Section 564(b)(1) of the Act, 21 U.S.C.section 360bbb-3(b)(1), unless the authorization is terminated  or revoked sooner.       Influenza A by PCR POSITIVE (A) NEGATIVE Final   Influenza B by PCR NEGATIVE NEGATIVE Final    Comment: (NOTE) The Xpert Xpress SARS-CoV-2/FLU/RSV plus assay is intended as an  aid in the diagnosis of influenza from Nasopharyngeal swab specimens and should not be used as a sole basis for treatment. Nasal washings and aspirates are unacceptable for Xpert Xpress SARS-CoV-2/FLU/RSV testing.  Fact Sheet for Patients: BloggerCourse.com  Fact Sheet for Healthcare Providers: SeriousBroker.it  This test is not yet approved or cleared by the [place] FDA and has been authorized for detection and/or diagnosis of SARS-CoV-2 by FDA under an Emergency Use Authorization (EUA). This EUA will remain in effect (meaning this test can be used) for the duration of the COVID-19 declaration under Section 564(b)(1) of the Act, 21 U.S.C. section 360bbb-3(b)(1), unless the authorization is terminated or revoked.  Performed at [facility], [address], [place], [place] [address]          Radiology Studies: DG Chest Port 1 View  Result Date: 05/02/2021 CLINICAL DATA:  Cough EXAM: PORTABLE CHEST 1 VIEW COMPARISON:  Chest radiograph 09/23/2019 FINDINGS: A left chest wall cardiac device and 3 associated leads are stable. The heart is enlarged, unchanged. The mediastinal contours are within normal limits. There is patchy opacity in the left base not seen on the prior study. There is no other focal consolidation. There is no pulmonary edema. There is no pleural effusion or pneumothorax. There is no acute osseous abnormality. IMPRESSION: 1. Patchy opacity in the left base not seen on the prior study could reflect pneumonia in the correct clinical setting. Recommend follow-up PA and lateral radiographs in 6-8 weeks to assess for resolution. 2. Cardiomegaly. Electronically Signed   By: Lesia Hausen M.D.   On: 05/02/2021 08:58        Scheduled Meds:  sodium chloride   Intravenous Once   atorvastatin  80 mg Oral Daily   carvedilol  25 mg Oral BID   insulin aspart  0-15 Units Subcutaneous TID WC   insulin aspart  0-5  Units Subcutaneous QHS   isosorbide dinitrate  30 mg Oral TID   oseltamivir  75 mg Oral BID   Continuous Infusions:  ferric gluconate (FERRLECIT) IVPB 250 mg (05/03/21 1226)   promethazine (PHENERGAN) injection (IM or IVPB) 6.25 mg (05/03/21 0742)     LOS: 1 day    Time spent: 25 minutes    Alberteen Sam, MD [facility] 05/03/2021, 12:58 PM     Please page though AMION or Epic secure chat:  For [employer], contact  charge nurse

## 2021-05-03 NOTE — Plan of Care (Signed)
Pt is alert oriented x 4. Pt uses wheel chair at home. Pt c/o nausea and vomiting, abdominal pain. PRN phenergan given. Ginger ale given, pt is on clear liquids. Pt is on heart monitor, pt has pacemaker.    Problem: Education: Goal: Knowledge of General Education information will improve Description: Including pain rating scale, medication(s)/side effects and non-pharmacologic comfort measures Outcome: Progressing   Problem: Health Behavior/Discharge Planning: Goal: Ability to manage health-related needs will improve Outcome: Progressing   Problem: Clinical Measurements: Goal: Ability to maintain clinical measurements within normal limits will improve Outcome: Progressing Goal: Will remain free from infection Outcome: Progressing Goal: Diagnostic test results will improve Outcome: Progressing Goal: Respiratory complications will improve Outcome: Progressing Goal: Cardiovascular complication will be avoided Outcome: Progressing   Problem: Activity: Goal: Risk for activity intolerance will decrease Outcome: Progressing   Problem: Nutrition: Goal: Adequate nutrition will be maintained Outcome: Progressing   Problem: Coping: Goal: Level of anxiety will decrease Outcome: Progressing   Problem: Elimination: Goal: Will not experience complications related to bowel motility Outcome: Progressing Goal: Will not experience complications related to urinary retention Outcome: Progressing   Problem: Pain Managment: Goal: General experience of comfort will improve Outcome: Progressing   Problem: Safety: Goal: Ability to remain free from injury will improve Outcome: Progressing   Problem: Skin Integrity: Goal: Risk for impaired skin integrity will decrease Outcome: Progressing

## 2021-05-03 NOTE — Consult Note (Signed)
Reason for Consult: Anemia Referring Physician: [facility]  Linda Lowe HPI: This is a 70 year old female with a PMH nonischemic cardiomyopathy, CHF, AICD, HTN, DM, and CVA with right sided weakness, who is wheelchair bound, admitted for fatigue, malaise, nausea, vomiting, cough, and fevers.  She [place] from [place] and a few days later she started to feel ill.  As a result of her symptoms she presented to the ER and she was diagnosed with Influenza A infection.  Incidentally she was found to have an anemia at 6.0 g/dL MCV 84.  Previously on 09/23/2019 I was at 13.6 g/dL.  Her last colonoscopy with Dr. Loreta Ave on 07/09/2013 was normal and it was performed for screening purposes.  The patient denies any issues with hematochezia, melena, hematemesis, hemoptysis, or hematuria.  Past Medical History:  Diagnosis Date   AICD (automatic cardioverter/defibrillator) present    St Jude    Arthritis    Cardiomyopathy (HCC)    a. 05/2012 Echo: EF 30-35%, Gr 1 DD, mild LVH, mild MR, pericardial effusion   Chronic systolic heart failure (HCC) 08/26/2013   Diabetes mellitus    a. Dx > 5 y ago   Dyspnea    Dysrhythmia    Hypercholesteremia    Hypertension    ICD Biventricular implantable cardioverter-defibrillator (ICD) in situ [employer] [phone] [employer] 11/26/2013    St Jude    Pericardial effusion    a. 05/2012 Echo: circumferential pericardial effusion, mostly 76mm, largest pocket posterior - 20mm, mild RA indentation, no RV collapse, no tamponade.   Stroke (HCC) 04/29/2018   Right hemiparesis in [place]    Past Surgical History:  Procedure Laterality Date   ABDOMINAL HYSTERECTOMY     BI-VENTRICULAR IMPLANTABLE CARDIOVERTER DEFIBRILLATOR N/A 12/01/2013   St. Jude BI-VENTRICULAR IMPLANTABLE CARDIOVERTER DEFIBRILLATOR  (CRT-D);  Surgeon: Duke Salvia, MD;    FOOT SURGERY     LEFT AND RIGHT HEART CATHETERIZATION WITH CORONARY ANGIOGRAM  05/26/2012   Procedure:  LEFT AND RIGHT HEART CATHETERIZATION WITH CORONARY ANGIOGRAM;  Surgeon: Tonny Bollman, MD;  Location: [facility] CATH LAB;  Service: Cardiovascular;;   ORIF PATELLA Right 07/08/2017   Procedure: OPEN REDUCTION INTERNAL (ORIF) FIXATION PATELLA;  Surgeon: Sheral Apley, MD;  Location: [facility] OR;  Service: Orthopedics;  Laterality: Right;    Family History  Problem Relation Age of Onset   Other Other        No history of CAD   Tuberculosis Father        died when pt was young.   Stroke Brother    Stroke Brother     Social History:  reports that she has quit smoking. Her smoking use included cigarettes. She has never used smokeless tobacco. She reports that she does not drink alcohol and does not use drugs.  Allergies: No Known Allergies  Medications: Scheduled:  sodium chloride   Intravenous Once   atorvastatin  80 mg Oral Daily   carvedilol  25 mg Oral BID   insulin aspart  0-15 Units Subcutaneous TID WC   insulin aspart  0-5 Units Subcutaneous QHS   isosorbide dinitrate  30 mg Oral TID   oseltamivir  75 mg Oral BID   Continuous:  ferric gluconate (FERRLECIT) IVPB 250 mg (05/03/21 1226)   promethazine (PHENERGAN) injection (IM or IVPB) 6.25 mg (05/03/21 0742)    Results for orders placed or performed during the hospital encounter of 05/02/21 (from the past 24 hour(s))  Resp Panel by RT-PCR (  Flu A&B, Covid) Nasopharyngeal Swab     Status: Abnormal   Collection Time: 05/02/21  2:50 PM   Specimen: Nasopharyngeal Swab; Nasopharyngeal(NP) swabs in vial transport medium  Result Value Ref Range   SARS Coronavirus 2 by RT PCR NEGATIVE NEGATIVE   Influenza A by PCR POSITIVE (A) NEGATIVE   Influenza B by PCR NEGATIVE NEGATIVE  Lactate dehydrogenase     Status: Abnormal   Collection Time: 05/02/21  6:35 PM  Result Value Ref Range   LDH 199 (H) 98 - 192 U/L  Iron and TIBC     Status: Abnormal   Collection Time: 05/02/21  6:35 PM  Result Value Ref Range   Iron 15 (L) 28 - 170 ug/dL   TIBC  440 347 - 425 ug/dL   Saturation Ratios 5 (L) 10.4 - 31.8 %   UIBC 285 ug/dL  Ferritin     Status: None   Collection Time: 05/02/21  6:35 PM  Result Value Ref Range   Ferritin 18 11 - 307 ng/mL  CBG monitoring, ED     Status: None   Collection Time: 05/02/21  9:25 PM  Result Value Ref Range   Glucose-Capillary 98 70 - 99 mg/dL  CBC     Status: Abnormal   Collection Time: 05/03/21  3:37 AM  Result Value Ref Range   WBC 10.8 (H) 4.0 - 10.5 K/uL   RBC 2.31 (L) 3.87 - 5.11 MIL/uL   Hemoglobin 6.0 (LL) 12.0 - 15.0 g/dL   HCT 95.6 (L) 38.7 - 56.4 %   MCV 84.4 80.0 - 100.0 fL   MCH 26.0 26.0 - 34.0 pg   MCHC 30.8 30.0 - 36.0 g/dL   RDW 33.2 (H) 95.1 - 88.4 %   Platelets 275 150 - 400 K/uL   nRBC 0.2 0.0 - 0.2 %  Basic metabolic panel     Status: Abnormal   Collection Time: 05/03/21  3:37 AM  Result Value Ref Range   Sodium 134 (L) 135 - 145 mmol/L   Potassium 3.8 3.5 - 5.1 mmol/L   Chloride 102 98 - 111 mmol/L   CO2 23 22 - 32 mmol/L   Glucose, Bld 104 (H) 70 - 99 mg/dL   BUN 24 (H) 8 - 23 mg/dL   Creatinine, Ser 1.66 0.44 - 1.00 mg/dL   Calcium 7.8 (L) 8.9 - 10.3 mg/dL   GFR, Estimated >06 >30 mL/min   Anion gap 9 5 - 15  Procalcitonin     Status: None   Collection Time: 05/03/21  3:37 AM  Result Value Ref Range   Procalcitonin <0.10 ng/mL  Hemoglobin A1c     Status: Abnormal   Collection Time: 05/03/21  3:38 AM  Result Value Ref Range   Hgb A1c MFr Bld 8.0 (H) 4.8 - 5.6 %   Mean Plasma Glucose 182.9 mg/dL  Prepare RBC (crossmatch)     Status: None   Collection Time: 05/03/21  6:08 AM  Result Value Ref Range   Order Confirmation      ORDER PROCESSED BY BLOOD BANK Performed at [facility], [address] [place], [place] [address]   Glucose, capillary     Status: None   Collection Time: 05/03/21  6:58 AM  Result Value Ref Range   Glucose-Capillary 87 70 - 99 mg/dL     DG Chest Port 1 View  Result Date: 05/02/2021 CLINICAL DATA:  Cough EXAM: PORTABLE CHEST  1 VIEW COMPARISON:  Chest radiograph 09/23/2019 FINDINGS:  A left chest wall cardiac device and 3 associated leads are stable. The heart is enlarged, unchanged. The mediastinal contours are within normal limits. There is patchy opacity in the left base not seen on the prior study. There is no other focal consolidation. There is no pulmonary edema. There is no pleural effusion or pneumothorax. There is no acute osseous abnormality. IMPRESSION: 1. Patchy opacity in the left base not seen on the prior study could reflect pneumonia in the correct clinical setting. Recommend follow-up PA and lateral radiographs in 6-8 weeks to assess for resolution. 2. Cardiomegaly. Electronically Signed   By: Lesia Hausen M.D.   On: 05/02/2021 08:58    ROS:  As stated above in the HPI otherwise negative.  Blood pressure (!) 107/45, pulse 63, temperature 99.1 F (37.3 C), temperature source Oral, resp. rate 20, height 5\' 3"  (1.6 m), weight 63.7 kg, SpO2 95 %.    PE: Gen: NAD, very ill and feeble appearing, soft spoken, coughing HEENT:  [place]/AT, EOMI Neck: Supple, no LAD Lungs: CTA Bilaterally CV: RRR ABD: Soft, NTND, +BS Ext: No C/C/E  Assessment/Plan: 1) Anemia. 2) Influenza A. 3) CHF.   Further endoscopic evaluation is required, but the patient is not well enough to undergo the procedures.  She is very ill and weak appearing.  The patient cannot properly prep for a colonoscopy at this time and she may be too ill to undergo an EGD.  Plan: 1) Agree with the blood transfusions. 2) Agree with iron infusion. 3) Follow HGB. 4) Continue with Tamiflu. 5) Once the the patient is clinically improved, further evaluation with an EGD/colonoscopy can be performed.  Linda Lowe D 05/03/2021, 12:52 PM

## 2021-05-04 LAB — BASIC METABOLIC PANEL
Anion gap: 5 (ref 5–15)
BUN: 11 mg/dL (ref 8–23)
CO2: 24 mmol/L (ref 22–32)
Calcium: 7.7 mg/dL — ABNORMAL LOW (ref 8.9–10.3)
Chloride: 105 mmol/L (ref 98–111)
Creatinine, Ser: 0.57 mg/dL (ref 0.44–1.00)
GFR, Estimated: >60 mL/min (ref >60)
Glucose, Bld: 70 mg/dL (ref 70–99)
Potassium: 3.6 mmol/L (ref 3.5–5.1)
Sodium: 134 mmol/L — ABNORMAL LOW (ref 135–145)

## 2021-05-04 LAB — GLUCOSE, CAPILLARY
Glucose-Capillary: 134 mg/dL — ABNORMAL HIGH (ref 70–99)
Glucose-Capillary: 146 mg/dL — ABNORMAL HIGH (ref 70–99)
Glucose-Capillary: 241 mg/dL — ABNORMAL HIGH (ref 70–99)
Glucose-Capillary: 179 mg/dL — ABNORMAL HIGH (ref 70–99)

## 2021-05-04 LAB — CBC
HCT: 26.4 % — ABNORMAL LOW (ref 36.0–46.0)
Hemoglobin: 8.5 g/dL — ABNORMAL LOW (ref 12.0–15.0)
MCH: 26.9 pg (ref 26.0–34.0)
MCHC: 32.2 g/dL (ref 30.0–36.0)
MCV: 83.5 fL (ref 80.0–100.0)
Platelets: 298 10*3/uL (ref 150–400)
RBC: 3.16 MIL/uL — ABNORMAL LOW (ref 3.87–5.11)
RDW: 15.8 % — ABNORMAL HIGH (ref 11.5–15.5)
WBC: 12.7 10*3/uL — ABNORMAL HIGH (ref 4.0–10.5)
nRBC: 1 % — ABNORMAL HIGH (ref 0.0–0.2)

## 2021-05-04 LAB — TYPE AND SCREEN
ABO/RH(D): B POS
Antibody Screen: NEGATIVE
Unit division: 0

## 2021-05-04 LAB — BPAM RBC
Blood Product Expiration Date: 202209062359
ISSUE DATE / TIME: 202208181117
Unit Type and Rh: 7300

## 2021-05-04 LAB — PATHOLOGIST SMEAR REVIEW

## 2021-05-04 LAB — PROCALCITONIN: Procalcitonin: <0.1 ng/mL

## 2021-05-04 MED ORDER — GUAIFENESIN-DM 100-10 MG/5ML PO SYRP
5.0000 mL | ORAL_SOLUTION | ORAL | Status: DC | PRN
  Administered 2021-05-04: 5 mL via ORAL
  Filled 2021-05-04 (×3): qty 5

## 2021-05-04 MED ORDER — IPRATROPIUM-ALBUTEROL 0.5-2.5 (3) MG/3ML IN SOLN: 3.0000 mL | RESPIRATORY_TRACT | Status: DC | PRN

## 2021-05-04 MED ORDER — FERROUS SULFATE 325 (65 FE) MG PO TABS
325.0000 mg | ORAL_TABLET | Freq: Two times a day (BID) | ORAL | Status: DC
  Administered 2021-05-04: 325 mg via ORAL
  Filled 2021-05-04 (×2): qty 1

## 2021-05-04 MED ORDER — SENNOSIDES-DOCUSATE SODIUM 8.6-50 MG PO TABS
1.0000 | ORAL_TABLET | Freq: Every day | ORAL | Status: DC
  Administered 2021-05-04 – 2021-05-07 (×4): 1 via ORAL
  Filled 2021-05-04 (×4): qty 1

## 2021-05-04 MED ORDER — PEG 3350-KCL-NA BICARB-NACL 420 G PO SOLR
4000.0000 mL | Freq: Once | ORAL | Status: AC
  Administered 2021-05-05: 4000 mL via ORAL
  Filled 2021-05-04 (×2): qty 4000

## 2021-05-04 MED ORDER — PANTOPRAZOLE SODIUM 40 MG PO TBEC
40.0000 mg | DELAYED_RELEASE_TABLET | Freq: Every day | ORAL | Status: DC
  Filled 2021-05-04 (×2): qty 1

## 2021-05-04 MED ORDER — POLYETHYLENE GLYCOL 3350 17 G PO PACK
17.0000 g | PACK | Freq: Every day | ORAL | Status: DC | PRN
Start: 2021-05-04 — End: 2021-05-04

## 2021-05-04 MED ORDER — POTASSIUM CHLORIDE CRYS ER 20 MEQ PO TBCR
40.0000 meq | EXTENDED_RELEASE_TABLET | Freq: Once | ORAL | Status: AC
  Administered 2021-05-04: 40 meq via ORAL
  Filled 2021-05-04: qty 2

## 2021-05-04 MED ORDER — TRAZODONE HCL 50 MG PO TABS
50.0000 mg | ORAL_TABLET | Freq: Every evening | ORAL | Status: DC | PRN
  Administered 2021-05-04 – 2021-05-07 (×3): 50 mg via ORAL
  Filled 2021-05-04 (×3): qty 1

## 2021-05-04 NOTE — Plan of Care (Signed)

## 2021-05-04 NOTE — Progress Notes (Signed)
Subjective: Feeling better.  Objective: Vital signs in last 24 hours: Temp:  [98.2 F (36.8 C)-99.5 F (37.5 C)] 99.5 F (37.5 C) (08/19 1536) Pulse Rate:  [61-65] 63 (08/19 1536) Resp:  [14-20] 14 (08/19 1536) BP: (119-137)/(47-68) 126/54 (08/19 1536) SpO2:  [86 %-100 %] 94 % (08/19 1536) Last BM Date: 04/30/21  Intake/Output from previous day: 08/18 0701 - 08/19 0700 In: 1160 [P.O.:780; Blood:380] Out: 1 [Urine:1] Intake/Output this shift: Total I/O In: 560 [P.O.:560] Out: 1700 [Urine:1700]  General appearance: alert and no distress GI: soft, non-tender; bowel sounds normal; no masses,  no organomegaly  Lab Results: Recent Labs    05/02/21 0226 05/03/21 0337 05/03/21 1528 05/04/21 0445  WBC 12.4* 10.8*  --  12.7*  HGB 7.4* 6.0* 7.4* 8.5*  HCT 23.4* 19.5* 22.9* 26.4*  PLT 328 275  --  298   BMET Recent Labs    05/02/21 0226 05/03/21 0337 05/04/21 0445  NA 138 134* 134*  K 3.5 3.8 3.6  CL 107 102 105  CO2 21* 23 24  GLUCOSE 165* 104* 70  BUN 44* 24* 11  CREATININE 0.74 0.63 0.57  CALCIUM 8.5* 7.8* 7.7*   LFT Recent Labs    05/02/21 0226  PROT 6.4*  ALBUMIN 3.1*  AST 30  ALT 24  ALKPHOS 77  BILITOT 0.7   PT/INR No results for input(s): LABPROT, INR in the last 72 hours. Hepatitis Panel No results for input(s): HEPBSAG, HCVAB, HEPAIGM, HEPBIGM in the last 72 hours. C-Diff No results for input(s): CDIFFTOX in the last 72 hours. Fecal Lactopherrin No results for input(s): FECLLACTOFRN in the last 72 hours.  Studies/Results: No results found.  Medications: Scheduled:  sodium chloride   Intravenous Once   atorvastatin  80 mg Oral Daily   carvedilol  25 mg Oral BID   ferrous sulfate  325 mg Oral BID WC   insulin aspart  0-15 Units Subcutaneous TID WC   insulin aspart  0-5 Units Subcutaneous QHS   oseltamivir  75 mg Oral BID   [START ON 05/05/2021] pantoprazole  40 mg Oral QAC breakfast   [START ON 05/05/2021] polyethylene  glycol-electrolytes  4,000 mL Oral Once   potassium chloride  40 mEq Oral Once   senna-docusate  1 tablet Oral QHS   Continuous:  promethazine (PHENERGAN) injection (IM or IVPB) 6.25 mg (05/04/21 0529)    Assessment/Plan: 1) IDA. 2) CHF. 3) Nausea/vomiting - resolved. 4) Influenza A.   Clinically she appears much better today.  She denies any further nausea/vomiting and she was able to tolerate a regular diet today.  With the significant drop in her HGB she needs an inpatient evaluation.  She is still weak, but rapidly improving.  An EGD/colonoscopy will be scheduled for this Sunday.  Plan: 1) EGD/colonoscopy on Sunday. 2) Continue supportive care. 3) Follow HGB and transfuse as necessary.  LOS: 2 days   Linda Lowe D 05/04/2021, 3:49 PM

## 2021-05-04 NOTE — Plan of Care (Signed)
Patient is alert oriented x 4. Pt has right sided weakness. Pt had SCDs on at beginning of shift but pt asked for them to be removed. Pt has refused them all night, education has been provided regarding importance of scds and risks. Pt has had nausea, prn phenergan given with effective results. Pt has been c/o of abdominal pain 9/10 , tylenol not effective. Provider paged to inform and received order for PRN morphine which has been effective. Pt has purewick in place.   Problem: Education: Goal: Knowledge of General Education information will improve Description: Including pain rating scale, medication(s)/side effects and non-pharmacologic comfort measures Outcome: Progressing   Problem: Health Behavior/Discharge Planning: Goal: Ability to manage health-related needs will improve Outcome: Progressing   Problem: Clinical Measurements: Goal: Ability to maintain clinical measurements within normal limits will improve Outcome: Progressing Goal: Will remain free from infection Outcome: Progressing Goal: Diagnostic test results will improve Outcome: Progressing Goal: Respiratory complications will improve Outcome: Progressing Goal: Cardiovascular complication will be avoided Outcome: Progressing   Problem: Activity: Goal: Risk for activity intolerance will decrease Outcome: Progressing   Problem: Nutrition: Goal: Adequate nutrition will be maintained Outcome: Progressing   Problem: Coping: Goal: Level of anxiety will decrease Outcome: Progressing   Problem: Elimination: Goal: Will not experience complications related to bowel motility Outcome: Progressing Goal: Will not experience complications related to urinary retention Outcome: Progressing   Problem: Pain Managment: Goal: General experience of comfort will improve Outcome: Progressing   Problem: Safety: Goal: Ability to remain free from injury will improve Outcome: Progressing   Problem: Skin Integrity: Goal: Risk for  impaired skin integrity will decrease Outcome: Progressing

## 2021-05-04 NOTE — Progress Notes (Signed)
PROGRESS NOTE    Linda Lowe  XBM:[id number] DOB: 1951/04/18 DOA: 05/02/2021 PCP: Renaye Rakers, MD   Brief Narrative:  70 y.o. F with NICM and sCHF EF previously 20-30s now 55%, BiV PPM and ICD, HTN, IDDM, and hx stroke with residual R-sided weakness, now wheelchair bound who presented with 3 days progressive malaise, recurrent vomiting and cough. In the ER, found to have influenza.  Dehydrated, vomiting. Of note, incidentally noted to have new anemia.  Husband confirmed BRBPR.  GI team was consulted, found to be iron deficient and therefore IV iron given followed by p.o. supplements and bowel regimen.   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 [phone] [employer] 11/26/2013   Anemia   Intractable nausea and vomiting     Acute blood loss anemia Iron deficiency anemia Rectal bleeding Hemoglobin drifted down to 6.0 improved with PRBC transfusion - PPI.  IV iron given, will start p.o. supplements with bowel regimen - GI following.  Will defer the need of endoscopic evaluation to [facility]  Influenza LLL pneumonia due to influenza Intractable nausea and vomiting due to influenza Dehydration -Slowly improving.  Supportive care - Tamiflu day 3 of 5  Chronic systolic CHF Nonischemic cardiomyopathy History of pacemaker -Home meds on hold due to soft blood pressure and clinical dehydration -Hold furosemide and Invokana until clearly hydrating well - Continue carvedilol, atorvastatin -Hold Isordil, amlodipine, irbesartan seen given soft BP  Type 2 diabetes without complication with long-term use of insulin -Home meds on hold.  Insulin sliding scale and  Accu-Cheks  Cerebrovascular disease Residual right-sided weakness Hemiparesis, wheelchair bound mostly -Continue atorvastatin        DVT prophylaxis: Place and maintain sequential compression device Start: 05/03/21 0716 Code Status: Full  Family Communication:    Status is: Inpatient  Remains inpatient appropriate because:Inpatient level of care appropriate due to severity of illness  Dispo: The patient is from: Home              Anticipated d/c is to: Home              Patient currently is not medically stable to d/c.   Difficult to place patient No         Subjective: Doing ok, remains afebrile. Feels better but still quite weak overall.   Review of Systems Otherwise negative except as per HPI, including: General: Denies fever, chills, night sweats or unintended weight loss. Resp: Denies cough, wheezing, shortness of breath. Cardiac: Denies chest pain, palpitations, orthopnea, paroxysmal nocturnal dyspnea. GI: Denies abdominal pain, nausea, vomiting, diarrhea or constipation GU: Denies dysuria, frequency, hesitancy or incontinence MS: Denies muscle aches, joint pain or swelling Neuro: Denies headache, neurologic deficits (focal weakness, numbness, tingling), abnormal gait Psych: Denies anxiety, depression, SI/HI/AVH Skin: Denies new rashes or lesions ID: Denies sick contacts, exotic exposures, travel  Examination:  General exam: Appears calm and comfortable  Respiratory system: Clear to auscultation. Respiratory effort normal. Cardiovascular system: S1 & S2 heard, RRR. No JVD, murmurs, rubs, gallops or clicks. No pedal edema. Gastrointestinal system: Abdomen is nondistended, soft and nontender. No organomegaly or masses felt. Normal bowel sounds heard. Central nervous system: Alert and oriented. No focal neurological deficits. Extremities: Symmetric 5  x 5 power. Skin: No rashes, lesions or ulcers Psychiatry: Judgement and insight appear normal. Mood & affect  appropriate.     Objective: Vitals:   05/03/21 2252 05/04/21 0027 05/04/21 0350 05/04/21 0838  BP:  129/62 (!) 120/47 136/68  Pulse:  63 63 63  Resp:  18 15 20   Temp:  98.9 F (37.2 C) 98.9 F (37.2 C) 99 F (37.2 C)  TempSrc:  Oral Oral Oral  SpO2: 94% 100% 93% 90%  Weight:      Height:        Intake/Output Summary (Last 24 hours) at 05/04/2021 0846 Last data filed at 05/04/2021 0537 Gross per 24 hour  Intake 1160 ml  Output 1 ml  Net 1159 ml   Filed Weights   05/02/21 0215  Weight: 63.7 kg     Data Reviewed:   CBC: Recent Labs  Lab 05/02/21 0226 05/03/21 0337 05/03/21 1528 05/04/21 0445  WBC 12.4* 10.8*  --  12.7*  NEUTROABS 9.5*  --   --   --   HGB 7.4* 6.0* 7.4* 8.5*  HCT 23.4* 19.5* 22.9* 26.4*  MCV 83.3 84.4  --  83.5  PLT 328 275  --  298   Basic Metabolic Panel: Recent Labs  Lab 05/02/21 0226 05/03/21 0337 05/04/21 0445  NA 138 134* 134*  K 3.5 3.8 3.6  CL 107 102 105  CO2 21* 23 24  GLUCOSE 165* 104* 70  BUN 44* 24* 11  CREATININE 0.74 0.63 0.57  CALCIUM 8.5* 7.8* 7.7*   GFR: Estimated Creatinine Clearance: 60.5 mL/min (by C-G formula based on SCr of 0.57 mg/dL). Liver Function Tests: Recent Labs  Lab 05/02/21 0226  AST 30  ALT 24  ALKPHOS 77  BILITOT 0.7  PROT 6.4*  ALBUMIN 3.1*   Recent Labs  Lab 05/02/21 0226  LIPASE 26   No results for input(s): AMMONIA in the last 168 hours. Coagulation Profile: No results for input(s): INR, PROTIME in the last 168 hours. Cardiac Enzymes: No results for input(s): CKTOTAL, CKMB, CKMBINDEX, TROPONINI in the last 168 hours. BNP (last 3 results) No results for input(s): PROBNP in the last 8760 hours. HbA1C: Recent Labs    05/03/21 0338  HGBA1C 8.0*   CBG: Recent Labs  Lab 05/03/21 0658 05/03/21 1443 05/03/21 1834 05/03/21 2155 05/04/21 0616  GLUCAP 87 106* 83 70 134*   Lipid Profile: No results for input(s): CHOL, HDL, LDLCALC, TRIG, CHOLHDL, LDLDIRECT in the last 72  hours. Thyroid Function Tests: No results for input(s): TSH, T4TOTAL, FREET4, T3FREE, THYROIDAB in the last 72 hours. Anemia Panel: Recent Labs    05/02/21 1835  FERRITIN 18  TIBC 300  IRON 15*   Sepsis Labs: Recent Labs  Lab 05/02/21 1115 05/03/21 0337 05/04/21 0445  PROCALCITON <0.10 <0.10 <0.10    Recent Results (from the past 240 hour(s))  Resp Panel by RT-PCR (Flu A&B, Covid) Nasopharyngeal Swab     Status: Abnormal   Collection Time: 05/02/21  9:54 AM   Specimen: Nasopharyngeal Swab; Nasopharyngeal(NP) swabs in vial transport medium  Result Value Ref Range Status   SARS Coronavirus 2 by RT PCR NEGATIVE NEGATIVE Final    Comment: (NOTE) SARS-CoV-2 target nucleic acids are NOT DETECTED.  The SARS-CoV-2 RNA is generally detectable in upper respiratory specimens during the acute phase of infection. The lowest concentration of SARS-CoV-2 viral copies this assay can detect is 138 copies/mL. A negative result does not preclude SARS-Cov-2 infection and should not be used as  the sole basis for treatment or other patient management decisions. A negative result may occur with  improper specimen collection/handling, submission of specimen other than nasopharyngeal swab, presence of viral mutation(s) within the areas targeted by this assay, and inadequate number of viral copies(<138 copies/mL). A negative result must be combined with clinical observations, patient history, and epidemiological information. The expected result is Negative.  Fact Sheet for Patients:  BloggerCourse.com  Fact Sheet for Healthcare Providers:  SeriousBroker.it  This test is no t yet approved or cleared by the [place] FDA and  has been authorized for detection and/or diagnosis of SARS-CoV-2 by FDA under an Emergency Use Authorization (EUA). This EUA will remain  in effect (meaning this test can be used) for the duration of the COVID-19  declaration under Section 564(b)(1) of the Act, 21 U.S.C.section 360bbb-3(b)(1), unless the authorization is terminated  or revoked sooner.       Influenza A by PCR POSITIVE (A) NEGATIVE Final   Influenza B by PCR NEGATIVE NEGATIVE Final    Comment: (NOTE) The Xpert Xpress SARS-CoV-2/FLU/RSV plus assay is intended as an aid in the diagnosis of influenza from Nasopharyngeal swab specimens and should not be used as a sole basis for treatment. Nasal washings and aspirates are unacceptable for Xpert Xpress SARS-CoV-2/FLU/RSV testing.  Fact Sheet for Patients: BloggerCourse.com  Fact Sheet for Healthcare Providers: SeriousBroker.it  This test is not yet approved or cleared by the [place] FDA and has been authorized for detection and/or diagnosis of SARS-CoV-2 by FDA under an Emergency Use Authorization (EUA). This EUA will remain in effect (meaning this test can be used) for the duration of the COVID-19 declaration under Section 564(b)(1) of the Act, 21 U.S.C. section 360bbb-3(b)(1), unless the authorization is terminated or revoked.  Performed at [facility], [address], [place], [place] [address]   Resp Panel by RT-PCR (Flu A&B, Covid) Nasopharyngeal Swab     Status: Abnormal   Collection Time: 05/02/21  2:50 PM   Specimen: Nasopharyngeal Swab; Nasopharyngeal(NP) swabs in vial transport medium  Result Value Ref Range Status   SARS Coronavirus 2 by RT PCR NEGATIVE NEGATIVE Final    Comment: (NOTE) SARS-CoV-2 target nucleic acids are NOT DETECTED.  The SARS-CoV-2 RNA is generally detectable in upper respiratory specimens during the acute phase of infection. The lowest concentration of SARS-CoV-2 viral copies this assay can detect is 138 copies/mL. A negative result does not preclude SARS-Cov-2 infection and should not be used as the sole basis for treatment or other patient management decisions. A negative  result may occur with  improper specimen collection/handling, submission of specimen other than nasopharyngeal swab, presence of viral mutation(s) within the areas targeted by this assay, and inadequate number of viral copies(<138 copies/mL). A negative result must be combined with clinical observations, patient history, and epidemiological information. The expected result is Negative.  Fact Sheet for Patients:  BloggerCourse.com  Fact Sheet for Healthcare Providers:  SeriousBroker.it  This test is no t yet approved or cleared by the [place] FDA and  has been authorized for detection and/or diagnosis of SARS-CoV-2 by FDA under an Emergency Use Authorization (EUA). This EUA will remain  in effect (meaning this test can be used) for the duration of the COVID-19 declaration under Section 564(b)(1) of the Act, 21 U.S.C.section 360bbb-3(b)(1), unless the authorization is terminated  or revoked sooner.       Influenza A by PCR POSITIVE (A) NEGATIVE Final   Influenza B by PCR NEGATIVE  NEGATIVE Final    Comment: (NOTE) The Xpert Xpress SARS-CoV-2/FLU/RSV plus assay is intended as an aid in the diagnosis of influenza from Nasopharyngeal swab specimens and should not be used as a sole basis for treatment. Nasal washings and aspirates are unacceptable for Xpert Xpress SARS-CoV-2/FLU/RSV testing.  Fact Sheet for Patients: BloggerCourse.com  Fact Sheet for Healthcare Providers: SeriousBroker.it  This test is not yet approved or cleared by the [place] FDA and has been authorized for detection and/or diagnosis of SARS-CoV-2 by FDA under an Emergency Use Authorization (EUA). This EUA will remain in effect (meaning this test can be used) for the duration of the COVID-19 declaration under Section 564(b)(1) of the Act, 21 U.S.C. section 360bbb-3(b)(1), unless the authorization is  terminated or revoked.  Performed at [facility], [address] [place], [place] [address]          Radiology Studies: DG Chest Port 1 View  Result Date: 05/02/2021 CLINICAL DATA:  Cough EXAM: PORTABLE CHEST 1 VIEW COMPARISON:  Chest radiograph 09/23/2019 FINDINGS: A left chest wall cardiac device and 3 associated leads are stable. The heart is enlarged, unchanged. The mediastinal contours are within normal limits. There is patchy opacity in the left base not seen on the prior study. There is no other focal consolidation. There is no pulmonary edema. There is no pleural effusion or pneumothorax. There is no acute osseous abnormality. IMPRESSION: 1. Patchy opacity in the left base not seen on the prior study could reflect pneumonia in the correct clinical setting. Recommend follow-up PA and lateral radiographs in 6-8 weeks to assess for resolution. 2. Cardiomegaly. Electronically Signed   By: Lesia Hausen M.D.   On: 05/02/2021 08:58        Scheduled Meds:  sodium chloride   Intravenous Once   atorvastatin  80 mg Oral Daily   carvedilol  25 mg Oral BID   insulin aspart  0-15 Units Subcutaneous TID WC   insulin aspart  0-5 Units Subcutaneous QHS   oseltamivir  75 mg Oral BID   pantoprazole (PROTONIX) IV  40 mg Intravenous Q12H   Continuous Infusions:  ferric gluconate (FERRLECIT) IVPB Stopped (05/03/21 1326)   promethazine (PHENERGAN) injection (IM or IVPB) 6.25 mg (05/04/21 0529)     LOS: 2 days   Time spent= 35 mins    Egbert Seidel Joline Maxcy, MD [facility]  If 7PM-7AM, please contact night-coverage  05/04/2021, 8:46 AM

## 2021-05-04 NOTE — Progress Notes (Signed)
Spoke to pt's spouse, states his covid pcr test is positive. Will update provider.

## 2021-05-05 LAB — BASIC METABOLIC PANEL
Anion gap: 3 — ABNORMAL LOW (ref 5–15)
BUN: 9 mg/dL (ref 8–23)
CO2: 26 mmol/L (ref 22–32)
Calcium: 8.1 mg/dL — ABNORMAL LOW (ref 8.9–10.3)
Chloride: 107 mmol/L (ref 98–111)
Creatinine, Ser: 0.62 mg/dL (ref 0.44–1.00)
GFR, Estimated: >60 mL/min (ref >60)
Glucose, Bld: 156 mg/dL — ABNORMAL HIGH (ref 70–99)
Potassium: 4.3 mmol/L (ref 3.5–5.1)
Sodium: 136 mmol/L (ref 135–145)

## 2021-05-05 LAB — GLUCOSE, CAPILLARY
Glucose-Capillary: 157 mg/dL — ABNORMAL HIGH (ref 70–99)
Glucose-Capillary: 151 mg/dL — ABNORMAL HIGH (ref 70–99)
Glucose-Capillary: 232 mg/dL — ABNORMAL HIGH (ref 70–99)
Glucose-Capillary: 184 mg/dL — ABNORMAL HIGH (ref 70–99)

## 2021-05-05 LAB — CBC
HCT: 27.2 % — ABNORMAL LOW (ref 36.0–46.0)
Hemoglobin: 8.8 g/dL — ABNORMAL LOW (ref 12.0–15.0)
MCH: 27.1 pg (ref 26.0–34.0)
MCHC: 32.4 g/dL (ref 30.0–36.0)
MCV: 83.7 fL (ref 80.0–100.0)
Platelets: 327 10*3/uL (ref 150–400)
RBC: 3.25 MIL/uL — ABNORMAL LOW (ref 3.87–5.11)
RDW: 16 % — ABNORMAL HIGH (ref 11.5–15.5)
WBC: 14.4 10*3/uL — ABNORMAL HIGH (ref 4.0–10.5)
nRBC: 3.6 % — ABNORMAL HIGH (ref 0.0–0.2)

## 2021-05-05 MED ORDER — SODIUM CHLORIDE 0.9 % IV SOLN: INTRAVENOUS | Status: DC

## 2021-05-05 MED ORDER — FERROUS SULFATE 325 (65 FE) MG PO TABS
325.0000 mg | ORAL_TABLET | Freq: Two times a day (BID) | ORAL | Status: DC
  Administered 2021-05-07 – 2021-05-08 (×3): 325 mg via ORAL
  Filled 2021-05-05 (×3): qty 1

## 2021-05-05 MED ORDER — METOCLOPRAMIDE HCL 5 MG/ML IJ SOLN
10.0000 mg | Freq: Once | INTRAMUSCULAR | Status: AC
  Administered 2021-05-05: 10 mg via INTRAVENOUS
  Filled 2021-05-05: qty 2

## 2021-05-05 MED ORDER — HYDRALAZINE HCL 25 MG PO TABS
25.0000 mg | ORAL_TABLET | Freq: Three times a day (TID) | ORAL | Status: DC
  Administered 2021-05-05 – 2021-05-08 (×10): 25 mg via ORAL
  Filled 2021-05-05 (×10): qty 1

## 2021-05-05 MED ORDER — AMLODIPINE BESYLATE 10 MG PO TABS
10.0000 mg | ORAL_TABLET | Freq: Every day | ORAL | Status: DC
  Administered 2021-05-05 – 2021-05-07 (×3): 10 mg via ORAL
  Filled 2021-05-05 (×2): qty 1
  Filled 2021-05-05: qty 2

## 2021-05-05 NOTE — Plan of Care (Signed)
Pt is alert oriented x 4. Pt states nausea and abdominal pain has been improving. Pt has refused SCDs, pt states they are uncomfortable, education provided.   Problem: Education: Goal: Knowledge of General Education information will improve Description: Including pain rating scale, medication(s)/side effects and non-pharmacologic comfort measures Outcome: Progressing   Problem: Health Behavior/Discharge Planning: Goal: Ability to manage health-related needs will improve Outcome: Progressing   Problem: Clinical Measurements: Goal: Ability to maintain clinical measurements within normal limits will improve Outcome: Progressing Goal: Will remain free from infection Outcome: Progressing Goal: Diagnostic test results will improve Outcome: Progressing Goal: Respiratory complications will improve Outcome: Progressing Goal: Cardiovascular complication will be avoided Outcome: Progressing   Problem: Activity: Goal: Risk for activity intolerance will decrease Outcome: Progressing   Problem: Nutrition: Goal: Adequate nutrition will be maintained Outcome: Progressing   Problem: Coping: Goal: Level of anxiety will decrease Outcome: Progressing   Problem: Elimination: Goal: Will not experience complications related to bowel motility Outcome: Progressing Goal: Will not experience complications related to urinary retention Outcome: Progressing   Problem: Pain Managment: Goal: General experience of comfort will improve Outcome: Progressing   Problem: Safety: Goal: Ability to remain free from injury will improve Outcome: Progressing   Problem: Skin Integrity: Goal: Risk for impaired skin integrity will decrease Outcome: Progressing

## 2021-05-05 NOTE — Progress Notes (Addendum)
          Daily Rounding Note  05/05/2021, 9:53 AM  LOS: 3 days   SUBJECTIVE:   Chief complaint: Iron deficiency anemia.  Nausea and vomiting.  No further nausea for the last 2 days or more.  Breathing is better.  No complaints.  OBJECTIVE:         Vital signs in last 24 hours:    Temp:  [98.4 F (36.9 C)-99.8 F (37.7 C)] 98.7 F (37.1 C) (08/20 0800) Pulse Rate:  [61-65] 63 (08/20 0800) Resp:  [14-18] 14 (08/20 0800) BP: (126-150)/(54-72) 140/61 (08/20 0800) SpO2:  [86 %-99 %] 99 % (08/20 0401) Last BM Date: 04/30/21 Filed Weights   05/02/21 0215  Weight: 63.7 kg   General: Looks infirm but pleasant and comfortable. Heart: RRR.  Systolic murmur. Chest: Some crackles in the bases but no labored breathing.  No cough. Abdomen: Soft without tenderness.  Not distended.  Active bowel sounds Extremities: No CCE.  Contracture of the right upper extremity.  Neuro/Psych: No tremors.  Pleasant.  Speech a bit limited but clear and appropriate.  Oriented x3.  Intake/Output from previous day: 08/19 0701 - 08/20 0700 In: 1080 [P.O.:560; IV Piggyback:520] Out: 6100 [Urine:6100]  Intake/Output this shift: No intake/output data recorded.  Lab Results: Recent Labs    05/03/21 0337 05/03/21 1528 05/04/21 0445 05/05/21 0147  WBC 10.8*  --  12.7* 14.4*  HGB 6.0* 7.4* 8.5* 8.8*  HCT 19.5* 22.9* 26.4* 27.2*  PLT 275  --  298 327   BMET Recent Labs    05/03/21 0337 05/04/21 0445 05/05/21 0147  NA 134* 134* 136  K 3.8 3.6 4.3  CL 102 105 107  CO2 23 24 26  GLUCOSE 104* 70 156*  BUN 24* 11 9  CREATININE 0.63 0.57 0.62  CALCIUM 7.8* 7.7* 8.1*   LFT No results for input(s): PROT, ALBUMIN, AST, ALT, ALKPHOS, BILITOT, BILIDIR, IBILI in the last 72 hours. PT/INR No results for input(s): LABPROT, INR in the last 72 hours. Hepatitis Panel No results for input(s): HEPBSAG, HCVAB, HEPAIGM, HEPBIGM in the last 72  hours.  Studies/Results: No results found.  ASSESMENT:      Iron deficiency anemia.  Hgb 6 >> 1 PRBC >> 8.8.   FOBT status not determined.  Ferrlecit infusion on 8/18.  Iron po  bid in place.    Nausea and vomiting.     Influenza A.  Tamiflu day 4 of 5.      IDDM   PLAN      Dr. Hung had wanted EGD and colonoscopy on 8/21.  Orders for nulytely prep in place.      See orders for bowel prep.  Hold po iron until 8/22, makes prep harder and can look like blood at colonoscopy.      Sarah Gribbin  05/05/2021, 9:53 AM Phone [phone]    Attending Physician's Attestation   I have taken an interval history, reviewed the chart and examined the patient.   70-year-old female with significant comorbidities as mentioned above, presented with nausea/vomiting dyspnea, found to have influenza new for her iron deficiency anemia.  She has recovered somewhat from her flu and should be able to tolerate a bowel prep and an upper and lower endoscopy per Dr. Hung's plans to identify a source of her anemia.  I agree with the Advanced Practitioner's note, impression, and recommendations with updates and my documentation above.   Solaris Kram, MD [facility]  

## 2021-05-05 NOTE — Plan of Care (Signed)

## 2021-05-05 NOTE — H&P (View-Only) (Signed)
          Daily Rounding Note  05/05/2021, 9:53 AM  LOS: 3 days   SUBJECTIVE:   Chief complaint: Iron deficiency anemia.  Nausea and vomiting.  No further nausea for the last 2 days or more.  Breathing is better.  No complaints.  OBJECTIVE:         Vital signs in last 24 hours:    Temp:  [98.4 F (36.9 C)-99.8 F (37.7 C)] 98.7 F (37.1 C) (08/20 0800) Pulse Rate:  [61-65] 63 (08/20 0800) Resp:  [14-18] 14 (08/20 0800) BP: (126-150)/(54-72) 140/61 (08/20 0800) SpO2:  [86 %-99 %] 99 % (08/20 0401) Last BM Date: 04/30/21 Filed Weights   05/02/21 0215  Weight: 63.7 kg   General: Looks infirm but pleasant and comfortable. Heart: RRR.  Systolic murmur. Chest: Some crackles in the bases but no labored breathing.  No cough. Abdomen: Soft without tenderness.  Not distended.  Active bowel sounds Extremities: No CCE.  Contracture of the right upper extremity.  Neuro/Psych: No tremors.  Pleasant.  Speech a bit limited but clear and appropriate.  Oriented x3.  Intake/Output from previous day: 08/19 0701 - 08/20 0700 In: 1080 [P.O.:560; IV Piggyback:520] Out: 6100 [Urine:6100]  Intake/Output this shift: No intake/output data recorded.  Lab Results: Recent Labs    05/03/21 0337 05/03/21 1528 05/04/21 0445 05/05/21 0147  WBC 10.8*  --  12.7* 14.4*  HGB 6.0* 7.4* 8.5* 8.8*  HCT 19.5* 22.9* 26.4* 27.2*  PLT 275  --  298 327   BMET Recent Labs    05/03/21 0337 05/04/21 0445 05/05/21 0147  NA 134* 134* 136  K 3.8 3.6 4.3  CL 102 105 107  CO2 23 24 26   GLUCOSE 104* 70 156*  BUN 24* 11 9  CREATININE 0.63 0.57 0.62  CALCIUM 7.8* 7.7* 8.1*   LFT No results for input(s): PROT, ALBUMIN, AST, ALT, ALKPHOS, BILITOT, BILIDIR, IBILI in the last 72 hours. PT/INR No results for input(s): LABPROT, INR in the last 72 hours. Hepatitis Panel No results for input(s): HEPBSAG, HCVAB, HEPAIGM, HEPBIGM in the last 72  hours.  Studies/Results: No results found.  ASSESMENT:      Iron deficiency anemia.  Hgb 6 >> 1 PRBC >> 8.8.   FOBT status not determined.  Ferrlecit infusion on 8/18.  Iron po  bid in place.    Nausea and vomiting.     Influenza A.  Tamiflu day 4 of 5.      IDDM   PLAN      Dr. 9/18 had wanted EGD and colonoscopy on 8/21.  Orders for nulytely prep in place.      See orders for bowel prep.  Hold po iron until 8/22, makes prep harder and can look like blood at colonoscopy.      9/22  05/05/2021, 9:53 AM Phone [phone]    Attending Physician's Attestation   I have taken an interval history, reviewed the chart and examined the patient.   70 year old female with significant comorbidities as mentioned above, presented with nausea/vomiting dyspnea, found to have influenza new for her iron deficiency anemia.  She has recovered somewhat from her flu and should be able to tolerate a bowel prep and an upper and lower endoscopy per Dr. 73 plans to identify a source of her anemia.  I agree with the Advanced Practitioner's note, impression, and recommendations with updates and my documentation above.   Haywood Pao, MD  Gastroenterology

## 2021-05-05 NOTE — Progress Notes (Addendum)
PROGRESS NOTE    Linda Lowe  TIR:[id number] DOB: 1950/10/24 DOA: 05/02/2021 PCP: Renaye Rakers, MD   Brief Narrative:  70 y.o. F with NICM and sCHF EF previously 20-30s now 55%, BiV PPM and ICD, HTN, IDDM, and hx stroke with residual R-sided weakness, now wheelchair bound who presented with 3 days progressive malaise, recurrent vomiting and cough. In the ER, found to have influenza.  Dehydrated, vomiting. Of note, incidentally noted to have new anemia.  Husband confirmed BRBPR.  GI team was consulted, found to be iron deficient and therefore IV iron given followed by p.o. supplements and bowel regimen.  EGD/colonoscopy planned by GI.   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 [phone] [employer] 11/26/2013   Anemia   Intractable nausea and vomiting     Acute blood loss anemia Iron deficiency anemia Rectal bleeding Hemoglobin drifted down to 6.0 improved with PRBC transfusion. Hb 8.8 - PPI.  IV iron given, will start p.o. supplements with bowel regimen - GI planning-EGD/colonoscopy on Sunday  Influenza LLL pneumonia due to influenza Intractable nausea and vomiting due to influenza Dehydration - Continue supportive care. - Tamiflu day 4 of 5  Leukocytosis 12.7>14.4 - No evidence of infection besides influenza at this time.  Procalcitonin is negative.  Continue to monitor for now  Chronic systolic CHF Nonischemic cardiomyopathy History of pacemaker Essential hypertension -Home meds on hold due to soft blood pressure and clinical dehydration -Hold furosemide and Invokana until clearly hydrating well - Continue carvedilol, atorvastatin -Hold Isordil,   irbesartan  -Resume hydralazine and Norvasc  Type 2 diabetes without complication with long-term use of insulin -Home meds on hold.  Insulin sliding scale and Accu-Cheks - A1c 8.0  Cerebrovascular disease Residual right-sided weakness Hemiparesis, wheelchair bound mostly -Continue atorvastatin      DVT prophylaxis: Place and maintain sequential compression device Start: 05/03/21 0716 Code Status: Full  Family Communication:    Status is: Inpatient  Remains inpatient appropriate because:Inpatient level of care appropriate due to severity of illness  Dispo: The patient is from: Home              Anticipated d/c is to: Home              Patient currently is not medically stable to d/c.  Plan for endoscopic evaluation tomorrow   Difficult to place patient No         Subjective: Feels ok, no new complaints.   Review of Systems Otherwise negative except as per HPI, including: General = no fevers, chills, dizziness,  fatigue HEENT/EYES = negative for loss of vision, double vision, blurred vision,  sore throa Cardiovascular= negative for chest pain, palpitation Respiratory/lungs= negative for shortness of breath, cough, wheezing; hemoptysis,  Gastrointestinal= negative for nausea, vomiting, abdominal pain Genitourinary= negative for Dysuria MSK = Negative for arthralgia, myalgias Neurology= Negative for headache, numbness, tingling  Psychiatry= Negative for suicidal and homocidal ideation Skin= Negative for Rash   Examination:  Constitutional: Not in acute distress Respiratory: Clear to auscultation bilaterally Cardiovascular: Normal sinus rhythm, no rubs Abdomen: Nontender nondistended good bowel sounds Musculoskeletal: No edema noted Skin: No rashes seen Neurologic: CN 2-12 grossly intact.  And nonfocal Psychiatric: Normal judgment and  insight. Alert and oriented x 3. Normal mood.      Objective: Vitals:   05/04/21 1536 05/04/21 1941 05/04/21 2323 05/05/21  0401  BP: (!) 126/54 130/64 134/64 (!) 150/72  Pulse: 63 63 64 65  Resp: 14 16 18 16   Temp: 99.5 F (37.5 C) 99.8 F (37.7 C) 98.9 F (37.2 C) 98.4 F (36.9 C)  TempSrc: Oral Oral Oral Oral  SpO2: 94% 92% 97% 99%  Weight:      Height:        Intake/Output Summary (Last 24 hours) at 05/05/2021 0749 Last data filed at 05/05/2021 0438 Gross per 24 hour  Intake 1080 ml  Output 6100 ml  Net -5020 ml   Filed Weights   05/02/21 0215  Weight: 63.7 kg     Data Reviewed:   CBC: Recent Labs  Lab 05/02/21 0226 05/03/21 0337 05/03/21 1528 05/04/21 0445 05/05/21 0147  WBC 12.4* 10.8*  --  12.7* 14.4*  NEUTROABS 9.5*  --   --   --   --   HGB 7.4* 6.0* 7.4* 8.5* 8.8*  HCT 23.4* 19.5* 22.9* 26.4* 27.2*  MCV 83.3 84.4  --  83.5 83.7  PLT 328 275  --  298 327   Basic Metabolic Panel: Recent Labs  Lab 05/02/21 0226 05/03/21 0337 05/04/21 0445 05/05/21 0147  NA 138 134* 134* 136  K 3.5 3.8 3.6 4.3  CL 107 102 105 107  CO2 21* 23 24 26   GLUCOSE 165* 104* 70 156*  BUN 44* 24* 11 9  CREATININE 0.74 0.63 0.57 0.62  CALCIUM 8.5* 7.8* 7.7* 8.1*   GFR: Estimated Creatinine Clearance: 60.5 mL/min (by C-G formula based on SCr of 0.62 mg/dL). Liver Function Tests: Recent Labs  Lab 05/02/21 0226  AST 30  ALT 24  ALKPHOS 77  BILITOT 0.7  PROT 6.4*  ALBUMIN 3.1*   Recent Labs  Lab 05/02/21 0226  LIPASE 26   No results for input(s): AMMONIA in the last 168 hours. Coagulation Profile: No results for input(s): INR, PROTIME in the last 168 hours. Cardiac Enzymes: No results for input(s): CKTOTAL, CKMB, CKMBINDEX, TROPONINI in the last 168 hours. BNP (last 3 results) No results for input(s): PROBNP in the last 8760 hours. HbA1C: Recent Labs    05/03/21 0338  HGBA1C 8.0*   CBG: Recent Labs  Lab 05/04/21 0616 05/04/21 1151 05/04/21 1644 05/04/21 2211 05/05/21 0604  GLUCAP 134* 146* 241* 179* 157*   Lipid Profile: No results for input(s): CHOL, HDL, LDLCALC,  TRIG, CHOLHDL, LDLDIRECT in the last 72 hours. Thyroid Function Tests: No results for input(s): TSH, T4TOTAL, FREET4, T3FREE, THYROIDAB in the last 72 hours. Anemia Panel: Recent Labs    05/02/21 1835  FERRITIN 18  TIBC 300  IRON 15*   Sepsis Labs: Recent Labs  Lab 05/02/21 1115 05/03/21 0337 05/04/21 0445  PROCALCITON <0.10 <0.10 <0.10    Recent Results (from the past 240 hour(s))  Resp Panel by RT-PCR (Flu A&B, Covid) Nasopharyngeal Swab     Status: Abnormal   Collection Time: 05/02/21  9:54 AM   Specimen: Nasopharyngeal Swab; Nasopharyngeal(NP) swabs in vial transport medium  Result Value Ref Range Status   SARS Coronavirus 2 by RT PCR NEGATIVE NEGATIVE Final    Comment: (NOTE) SARS-CoV-2 target nucleic acids are NOT DETECTED.  The SARS-CoV-2 RNA is generally detectable in upper respiratory specimens during the acute phase of infection. The lowest concentration of SARS-CoV-2 viral copies this assay can detect is 138 copies/mL.  A negative result does not preclude SARS-Cov-2 infection and should not be used as the sole basis for treatment or other patient management decisions. A negative result may occur with  improper specimen collection/handling, submission of specimen other than nasopharyngeal swab, presence of viral mutation(s) within the areas targeted by this assay, and inadequate number of viral copies(<138 copies/mL). A negative result must be combined with clinical observations, patient history, and epidemiological information. The expected result is Negative.  Fact Sheet for Patients:  BloggerCourse.com  Fact Sheet for Healthcare Providers:  SeriousBroker.it  This test is no t yet approved or cleared by the [place] FDA and  has been authorized for detection and/or diagnosis of SARS-CoV-2 by FDA under an Emergency Use Authorization (EUA). This EUA will remain  in effect (meaning this test can be used)  for the duration of the COVID-19 declaration under Section 564(b)(1) of the Act, 21 U.S.C.section 360bbb-3(b)(1), unless the authorization is terminated  or revoked sooner.       Influenza A by PCR POSITIVE (A) NEGATIVE Final   Influenza B by PCR NEGATIVE NEGATIVE Final    Comment: (NOTE) The Xpert Xpress SARS-CoV-2/FLU/RSV plus assay is intended as an aid in the diagnosis of influenza from Nasopharyngeal swab specimens and should not be used as a sole basis for treatment. Nasal washings and aspirates are unacceptable for Xpert Xpress SARS-CoV-2/FLU/RSV testing.  Fact Sheet for Patients: BloggerCourse.com  Fact Sheet for Healthcare Providers: SeriousBroker.it  This test is not yet approved or cleared by the [place] FDA and has been authorized for detection and/or diagnosis of SARS-CoV-2 by FDA under an Emergency Use Authorization (EUA). This EUA will remain in effect (meaning this test can be used) for the duration of the COVID-19 declaration under Section 564(b)(1) of the Act, 21 U.S.C. section 360bbb-3(b)(1), unless the authorization is terminated or revoked.  Performed at [facility], [address] [place], [place] [address]   Resp Panel by RT-PCR (Flu A&B, Covid) Nasopharyngeal Swab     Status: Abnormal   Collection Time: 05/02/21  2:50 PM   Specimen: Nasopharyngeal Swab; Nasopharyngeal(NP) swabs in vial transport medium  Result Value Ref Range Status   SARS Coronavirus 2 by RT PCR NEGATIVE NEGATIVE Final    Comment: (NOTE) SARS-CoV-2 target nucleic acids are NOT DETECTED.  The SARS-CoV-2 RNA is generally detectable in upper respiratory specimens during the acute phase of infection. The lowest concentration of SARS-CoV-2 viral copies this assay can detect is 138 copies/mL. A negative result does not preclude SARS-Cov-2 infection and should not be used as the sole basis for treatment or other patient  management decisions. A negative result may occur with  improper specimen collection/handling, submission of specimen other than nasopharyngeal swab, presence of viral mutation(s) within the areas targeted by this assay, and inadequate number of viral copies(<138 copies/mL). A negative result must be combined with clinical observations, patient history, and epidemiological information. The expected result is Negative.  Fact Sheet for Patients:  BloggerCourse.com  Fact Sheet for Healthcare Providers:  SeriousBroker.it  This test is no t yet approved or cleared by the [place] FDA and  has been authorized for detection and/or diagnosis of SARS-CoV-2 by FDA under an Emergency Use Authorization (EUA). This EUA will remain  in effect (meaning this test can be used) for the duration of the COVID-19 declaration under Section 564(b)(1) of the Act, 21 U.S.C.section 360bbb-3(b)(1), unless the authorization is terminated  or revoked sooner.       Influenza  A by PCR POSITIVE (A) NEGATIVE Final   Influenza B by PCR NEGATIVE NEGATIVE Final    Comment: (NOTE) The Xpert Xpress SARS-CoV-2/FLU/RSV plus assay is intended as an aid in the diagnosis of influenza from Nasopharyngeal swab specimens and should not be used as a sole basis for treatment. Nasal washings and aspirates are unacceptable for Xpert Xpress SARS-CoV-2/FLU/RSV testing.  Fact Sheet for Patients: BloggerCourse.com  Fact Sheet for Healthcare Providers: SeriousBroker.it  This test is not yet approved or cleared by the [place] FDA and has been authorized for detection and/or diagnosis of SARS-CoV-2 by FDA under an Emergency Use Authorization (EUA). This EUA will remain in effect (meaning this test can be used) for the duration of the COVID-19 declaration under Section 564(b)(1) of the Act, 21 U.S.C. section  360bbb-3(b)(1), unless the authorization is terminated or revoked.  Performed at [facility], [address], [place], [place] [address]          Radiology Studies: No results found.      Scheduled Meds:  sodium chloride   Intravenous Once   atorvastatin  80 mg Oral Daily   carvedilol  25 mg Oral BID   ferrous sulfate  325 mg Oral BID WC   insulin aspart  0-15 Units Subcutaneous TID WC   insulin aspart  0-5 Units Subcutaneous QHS   oseltamivir  75 mg Oral BID   pantoprazole  40 mg Oral QAC breakfast   polyethylene glycol-electrolytes  4,000 mL Oral Once   senna-docusate  1 tablet Oral QHS   Continuous Infusions:  sodium chloride 20 mL/hr at 05/05/21 0743   promethazine (PHENERGAN) injection (IM or IVPB) Stopped (05/04/21 0544)     LOS: 3 days   Time spent= 35 mins    Donel Osowski Joline Maxcy, MD [facility]  If 7PM-7AM, please contact night-coverage  05/05/2021, 7:49 AM

## 2021-05-06 ENCOUNTER — Inpatient Hospital Stay (HOSPITAL_COMMUNITY): Payer: Medicare Other | Admitting: Certified Registered Nurse Anesthetist

## 2021-05-06 ENCOUNTER — Encounter (HOSPITAL_COMMUNITY)
Admission: EM | Disposition: A | Discharge status: Home / Self Care | Payer: Self-pay | Source: Home / Self Care | Attending: Internal Medicine

## 2021-05-06 DIAGNOSIS — K254 Chronic or unspecified gastric ulcer with hemorrhage: Secondary | ICD-10-CM | POA: Diagnosis not present

## 2021-05-06 DIAGNOSIS — D500 Iron deficiency anemia secondary to blood loss (chronic): Secondary | ICD-10-CM | POA: Diagnosis not present

## 2021-05-06 DIAGNOSIS — K269 Duodenal ulcer, unspecified as acute or chronic, without hemorrhage or perforation: Secondary | ICD-10-CM

## 2021-05-06 HISTORY — PX: BIOPSY: SHX5522

## 2021-05-06 HISTORY — PX: ESOPHAGOGASTRODUODENOSCOPY (EGD) WITH PROPOFOL: SHX5813

## 2021-05-06 HISTORY — PX: COLONOSCOPY WITH PROPOFOL: SHX5780

## 2021-05-06 LAB — BASIC METABOLIC PANEL
Anion gap: 6 (ref 5–15)
BUN: 5 mg/dL — ABNORMAL LOW (ref 8–23)
CO2: 25 mmol/L (ref 22–32)
Calcium: 8.3 mg/dL — ABNORMAL LOW (ref 8.9–10.3)
Chloride: 103 mmol/L (ref 98–111)
Creatinine, Ser: 0.5 mg/dL (ref 0.44–1.00)
GFR, Estimated: >60 mL/min (ref >60)
Glucose, Bld: 220 mg/dL — ABNORMAL HIGH (ref 70–99)
Potassium: 3.8 mmol/L (ref 3.5–5.1)
Sodium: 134 mmol/L — ABNORMAL LOW (ref 135–145)

## 2021-05-06 LAB — GLUCOSE, CAPILLARY
Glucose-Capillary: 222 mg/dL — ABNORMAL HIGH (ref 70–99)
Glucose-Capillary: 174 mg/dL — ABNORMAL HIGH (ref 70–99)
Glucose-Capillary: 259 mg/dL — ABNORMAL HIGH (ref 70–99)
Glucose-Capillary: 176 mg/dL — ABNORMAL HIGH (ref 70–99)
Glucose-Capillary: 210 mg/dL — ABNORMAL HIGH (ref 70–99)

## 2021-05-06 LAB — SURGICAL PCR SCREEN
MRSA, PCR: POSITIVE — AB
Staphylococcus aureus: POSITIVE — AB

## 2021-05-06 LAB — CBC
HCT: 30.4 % — ABNORMAL LOW (ref 36.0–46.0)
Hemoglobin: 9.8 g/dL — ABNORMAL LOW (ref 12.0–15.0)
MCH: 27.3 pg (ref 26.0–34.0)
MCHC: 32.2 g/dL (ref 30.0–36.0)
MCV: 84.7 fL (ref 80.0–100.0)
Platelets: 357 10*3/uL (ref 150–400)
RBC: 3.59 MIL/uL — ABNORMAL LOW (ref 3.87–5.11)
RDW: 17.2 % — ABNORMAL HIGH (ref 11.5–15.5)
WBC: 16.8 10*3/uL — ABNORMAL HIGH (ref 4.0–10.5)
nRBC: 3.9 % — ABNORMAL HIGH (ref 0.0–0.2)

## 2021-05-06 SURGERY — ESOPHAGOGASTRODUODENOSCOPY (EGD) WITH PROPOFOL
Anesthesia: Monitor Anesthesia Care | Laterality: Left

## 2021-05-06 MED ORDER — MUPIROCIN 2 % EX OINT
1.0000 "application " | TOPICAL_OINTMENT | Freq: Two times a day (BID) | CUTANEOUS | Status: DC
  Administered 2021-05-06 – 2021-05-08 (×5): 1 via NASAL
  Filled 2021-05-06: qty 22

## 2021-05-06 MED ORDER — LACTATED RINGERS IV SOLN: INTRAVENOUS | Status: DC | PRN

## 2021-05-06 MED ORDER — PROPOFOL 500 MG/50ML IV EMUL
INTRAVENOUS | Status: DC | PRN
  Administered 2021-05-06: 150 ug/kg/min via INTRAVENOUS

## 2021-05-06 MED ORDER — CHLORHEXIDINE GLUCONATE CLOTH 2 % EX PADS
6.0000 | MEDICATED_PAD | Freq: Every day | CUTANEOUS | Status: DC
Start: 2021-05-06 — End: 2021-05-08
  Administered 2021-05-06 – 2021-05-08 (×3): 6 via TOPICAL

## 2021-05-06 MED ORDER — SUCRALFATE 1 GM/10ML PO SUSP
1.0000 g | Freq: Three times a day (TID) | ORAL | Status: DC
  Administered 2021-05-06 – 2021-05-08 (×7): 1 g via ORAL
  Filled 2021-05-06 (×10): qty 10

## 2021-05-06 MED ORDER — PANTOPRAZOLE SODIUM 40 MG PO TBEC
40.0000 mg | DELAYED_RELEASE_TABLET | Freq: Two times a day (BID) | ORAL | Status: DC
  Administered 2021-05-06 – 2021-05-08 (×4): 40 mg via ORAL
  Filled 2021-05-06 (×4): qty 1

## 2021-05-06 MED ORDER — PROPOFOL 10 MG/ML IV BOLUS
INTRAVENOUS | Status: DC | PRN
  Administered 2021-05-06 (×4): 20 mg via INTRAVENOUS

## 2021-05-06 MED ORDER — LIDOCAINE 2% (20 MG/ML) 5 ML SYRINGE
INTRAMUSCULAR | Status: DC | PRN
  Administered 2021-05-06: 40 mg via INTRAVENOUS

## 2021-05-06 NOTE — Interval H&P Note (Signed)
History and Physical Interval Note:  No changes in her status since yesterday.  Hgb stable.  No overt bleeding.  Nausea improving.  Stool still liquid brown this morning, however.  05/06/2021 7:40 AM  Linda Lowe  has presented today for surgery, with the diagnosis of IDA.  The various methods of treatment have been discussed with the patient and family. After consideration of risks, benefits and other options for treatment, the patient has consented to  Procedure(s): ESOPHAGOGASTRODUODENOSCOPY (EGD) WITH PROPOFOL (Left) COLONOSCOPY WITH PROPOFOL (Left) as a surgical intervention.  The patient's history has been reviewed, patient examined, no change in status, stable for surgery.  I have reviewed the patient's chart and labs.  Questions were answered to the patient's satisfaction.     Jenel Lucks

## 2021-05-06 NOTE — Op Note (Signed)
[facility] Patient Name: Linda Lowe Procedure Date : 05/06/2021 MRN: [id number] Attending MD: Dub Amis. Tomasa Rand , MD Date of Birth: 1951/06/23 CSN: [id number] Age: 70 Admit Type: Inpatient Procedure:                Colonoscopy Indications:              Unexplained iron deficiency anemia Providers:                Lorin Picket E. Tomasa Rand, MD, Alan Ripper, Leanne Lovely, Technician, Dairl Ponder, CRNA, Vicki Mallet, RN Referring MD:              Medicines:                Monitored Anesthesia Care Complications:            No immediate complications. Estimated Blood Loss:     Estimated blood loss: none. Procedure:                Pre-Anesthesia Assessment:                           - Prior to the procedure, a History and Physical                            was performed, and patient medications and                            allergies were reviewed. The patient's tolerance of                            previous anesthesia was also reviewed. The risks                            and benefits of the procedure and the sedation                            options and risks were discussed with the patient.                            All questions were answered, and informed consent                            was obtained. Prior Anticoagulants: The patient has                            taken no previous anticoagulant or antiplatelet                            agents. ASA Grade Assessment: III - A patient with  severe systemic disease. After reviewing the risks                            and benefits, the patient was deemed in                            satisfactory condition to undergo the procedure.                           - Prior to the procedure, a History and Physical                            was performed, and patient medications and                            allergies were reviewed. The  patient's tolerance of                            previous anesthesia was also reviewed. The risks                            and benefits of the procedure and the sedation                            options and risks were discussed with the patient.                            All questions were answered, and informed consent                            was obtained. Prior Anticoagulants: The patient has                            taken no previous anticoagulant or antiplatelet                            agents. ASA Grade Assessment: III - A patient with                            severe systemic disease. After reviewing the risks                            and benefits, the patient was deemed in                            satisfactory condition to undergo the procedure.                           After obtaining informed consent, the colonoscope                            was passed under direct vision. Throughout the  procedure, the patient's blood pressure, pulse, and                            oxygen saturations were monitored continuously. The                            [id number]-[id number] ([id number] Olympus colonoscope was                            introduced through the anus and advanced to the the                            terminal ileum, with identification of the                            appendiceal orifice and IC valve. The colonoscopy                            was performed without difficulty. The patient                            tolerated the procedure well. The quality of the                            bowel preparation was adequate. Scope In: 8:06:38 AM Scope Out: 8:19:30 AM Scope Withdrawal Time: 0 hours 8 minutes 0 seconds  Total Procedure Duration: 0 hours 12 minutes 52 seconds  Findings:      Skin tags were found on perianal exam.      The digital rectal exam was normal. Pertinent negatives include normal       sphincter tone and no palpable rectal  lesions.      Multiple small and large-mouthed diverticula were found in the       transverse colon and ascending colon.      The exam was otherwise normal throughout the examined colon.      The terminal ileum appeared normal.      Non-bleeding internal hemorrhoids were found during retroflexion. The       hemorrhoids were large.      No additional abnormalities were found on retroflexion. Impression:               - Perianal skin tags found on perianal exam.                           - Diverticulosis in the transverse colon and in the                            ascending colon.                           - The examined portion of the ileum was normal.                           - Non-bleeding internal hemorrhoids.                           -  No specimens collected.                           - The patient's anemia was secondary to peptic                            ulcer disease as demonstrated on EGD Recommendation:           - Return patient to hospital ward for ongoing care.                           - Advance diet as tolerated.                           - Continue present medications.                           - Given no polyps on this exam, and patient's age                            and comorbidities, recommend against further                            screening colonoscopies. Procedure Code(s):        --- Professional ---                           [phone], Colonoscopy, flexible; diagnostic, including                            collection of specimen(s) by brushing or washing,                            when performed (separate procedure) Diagnosis Code(s):        --- Professional ---                           K64.8, Other hemorrhoids                           K64.4, Residual hemorrhoidal skin tags                           D50.9, Iron deficiency anemia, unspecified                           K57.30, Diverticulosis of large intestine without                            perforation or  abscess without bleeding CPT copyright 2019 [employer]. All rights reserved. The codes documented in this report are preliminary and upon coder review may  be revised to meet current compliance requirements. Riven Beebe E. Tomasa Rand, MD 05/06/2021 8:41:36 AM This report has been signed electronically. Number of Addenda: 0

## 2021-05-06 NOTE — Op Note (Signed)
[facility] Patient Name: Linda Lowe Procedure Date : 05/06/2021 MRN: [id number] Attending MD: Linda Lowe , MD Date of Birth: 04/08/1951 CSN: [id number] Age: 70 Admit Type: Inpatient Procedure:                Upper GI endoscopy Indications:              Suspected upper gastrointestinal bleeding in                            patient with unexplained iron deficiency anemia Providers:                Linda Picket E. Tomasa Rand, MD, Linda Lowe, Linda Lowe, Technician, Linda Ponder, CRNA, Linda Mallet, RN Referring MD:              Medicines:                Monitored Anesthesia Care Complications:            No immediate complications. Estimated Blood Loss:     Estimated blood loss was minimal. Procedure:                Pre-Anesthesia Assessment:                           - Prior to the procedure, a History and Physical                            was performed, and patient medications and                            allergies were reviewed. The patient's tolerance of                            previous anesthesia was also reviewed. The risks                            and benefits of the procedure and the sedation                            options and risks were discussed with the patient.                            All questions were answered, and informed consent                            was obtained. Prior Anticoagulants: The patient has                            taken no previous anticoagulant or antiplatelet  agents. ASA Grade Assessment: III - A patient with                            severe systemic disease. After reviewing the risks                            and benefits, the patient was deemed in                            satisfactory condition to undergo the procedure.                           After obtaining informed consent, the endoscope was                             passed under direct vision. Throughout the                            procedure, the patient's blood pressure, pulse, and                            oxygen saturations were monitored continuously. The                            [id number]-H190 ([id number]) Olympus endoscope was introduced                            through the mouth, and advanced to the third part                            of duodenum. The upper GI endoscopy was                            accomplished without difficulty. The patient                            tolerated the procedure well. Scope In: Scope Out: Findings:      The examined portions of the nasopharynx, oropharynx and larynx were       normal.      The examined esophagus was normal.      A 3 cm hiatal hernia was present.      One non-bleeding cratered gastric ulcer with pigmented material was       found in the prepyloric region of the stomach. The lesion was about 20       mm in largest dimension. Biopsies were taken with a cold forceps for       histology at the edge of the ulcer and on the margin of the ulcer bed.       Estimated blood loss was minimal.      The exam of the stomach was otherwise normal.      Many non-bleeding superficial duodenal ulcers, some with with pigmented       material were found in the duodenal bulb and in the second portion of       the duodenum. The largest lesion was  7 mm in largest dimension. Biopsies       were taken with a cold forceps for histology. Estimated blood loss was       minimal.      The exam of the duodenum was otherwise normal. Impression:               - The examined portions of the nasopharynx,                            oropharynx and larynx were normal.                           - Normal esophagus.                           - 3 cm hiatal hernia.                           - Non-bleeding gastric ulcer with pigmented                            material. Biopsied.                           - Non-bleeding duodenal  ulcers with pigmented                            material. Biopsied. Recommendation:           - Return patient to hospital ward for ongoing care.                           - Advance diet as tolerated.                           - No aspirin, ibuprofen, naproxen, or other                            non-steroidal anti-inflammatory drugs.                           - Await pathology results.                           - Use a proton pump inhibitor PO BID for 8 weeks                            then decrease to once daily.                           - Repeat upper endoscopy in 8 weeks with Dr. Elnoria Lowe                            to check healing.                           - Use sucralfate suspension 1 gram PO QID for 2  weeks.                           - Perform an H. pylori serology today and consider                            treatment if positive, even if gastric biopsies                            negative. Procedure Code(s):        --- Professional ---                           [phone], Esophagogastroduodenoscopy, flexible,                            transoral; with biopsy, single or multiple Diagnosis Code(s):        --- Professional ---                           K44.9, Diaphragmatic hernia without obstruction or                            gangrene                           K25.9, Gastric ulcer, unspecified as acute or                            chronic, without hemorrhage or perforation                           K26.9, Duodenal ulcer, unspecified as acute or                            chronic, without hemorrhage or perforation                           D50.9, Iron deficiency anemia, unspecified CPT copyright 2019 American Medical Association. All rights reserved. The codes documented in this report are preliminary and upon coder review may  be revised to meet current compliance requirements. Linda Ewton E. Tomasa Rand, MD 05/06/2021 8:37:15 AM This report has been signed  electronically. Number of Addenda: 0

## 2021-05-06 NOTE — Anesthesia Postprocedure Evaluation (Signed)
Anesthesia Post Note  Patient: Linda Lowe  Procedure(s) Performed: ESOPHAGOGASTRODUODENOSCOPY (EGD) WITH PROPOFOL (Left) COLONOSCOPY WITH PROPOFOL (Left) BIOPSY     Patient location during evaluation: PACU Anesthesia Type: MAC Level of consciousness: awake and alert Pain management: pain level controlled Vital Signs Assessment: post-procedure vital signs reviewed and stable Respiratory status: spontaneous breathing, nonlabored ventilation, respiratory function stable and patient connected to nasal cannula oxygen Cardiovascular status: stable and blood pressure returned to baseline Postop Assessment: no apparent nausea or vomiting Anesthetic complications: no   No notable events documented.  Last Vitals:  Vitals:   05/06/21 0935 05/06/21 1132  BP: 136/66 (!) 146/66  Pulse: 65 68  Resp: 18 (!) 22  Temp: 37.1 C 37.3 C  SpO2: 98%     Last Pain:  Vitals:   05/06/21 1132  TempSrc: Oral  PainSc: 0-No pain                 Belenda Cruise P Frankey Botting

## 2021-05-06 NOTE — Progress Notes (Signed)
PROGRESS NOTE    Linda Lowe  DHD:[id number] DOB: 1951-05-28 DOA: 05/02/2021 PCP: Renaye Rakers, MD   Brief Narrative:  70 y.o. F with NICM and sCHF EF previously 20-30s now 55%, BiV PPM and ICD, HTN, IDDM, and hx stroke with residual R-sided weakness, now wheelchair bound who presented with 3 days progressive malaise, recurrent vomiting and cough. In the ER, found to have influenza.  Dehydrated, vomiting. Of note, incidentally noted to have new anemia.  Husband confirmed BRBPR.  GI team was consulted, found to be iron deficient and therefore IV iron given followed by p.o. supplements and bowel regimen.  EGD showed normal esophagus, 3 cm hiatal hernia, nonbleeding gastric and duodenal ulcer recommended PPI twice daily for 8 weeks followed by daily and repeat scope in about 8 weeks.  We will also need 2 weeks of sucralfate.  Colonoscopy showed diverticulosis and nonbleeding hemorrhoid.   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 [phone] [employer] 11/26/2013   Anemia   Intractable nausea and vomiting   Gastric ulcer with hemorrhage   Duodenal ulcer disease     Acute blood loss anemia Iron deficiency anemia Rectal bleeding Hemoglobin drifted down to 6.0 improved with PRBC transfusion. Hb 8.8 - EGD showed normal esophagus, 3 cm hiatal hernia, nonbleeding gastric and duodenal ulcer recommended PPI twice daily for 8 weeks followed by daily and repeat scope in about 8 weeks.  We will also need 2 weeks of sucralfate.  Colonoscopy showed diverticulosis and nonbleeding hemorrhoid. -P.o. iron with bowel regimen - PPI p.o. twice daily for 8 weeks followed by daily.   Sucralfate 1 g 4 times daily for 2 weeks.  H. pylori serology will follow by GI team.  Influenza LLL pneumonia due to influenza Intractable nausea and vomiting due to influenza Dehydration - Continue supportive care. - Tamiflu day 5 of 5  Leukocytosis 12.7>14.4 > 16.8 - No evidence of infection besides influenza at this time.  Procalcitonin is negative.  Continue to monitor for now  Chronic systolic CHF Nonischemic cardiomyopathy History of pacemaker Essential hypertension -Home meds on hold due to soft blood pressure and clinical dehydration -Hold furosemide and Invokana until clearly hydrating well - Continue carvedilol, atorvastatin -Hold Isordil,  irbesartan  -Resume hydralazine and Norvasc  Type 2 diabetes without complication with long-term use of insulin -Home meds on hold.  Insulin sliding scale and Accu-Cheks - A1c 8.0  Cerebrovascular disease Residual right-sided weakness Hemiparesis, wheelchair bound mostly -Continue atorvastatin      DVT prophylaxis: Place and maintain sequential compression device Start: 05/03/21 0716 Code Status: Full  Family Communication:    Status is: Inpatient  Remains inpatient appropriate because:Inpatient level of care appropriate due to severity of illness  Dispo: The patient is from: Home              Anticipated d/c is to: Home              Patient currently is not medically stable to d/c.  We will slowly advance diet as tolerated today.  Continue to monitor WBC.   Difficult to place patient No         Subjective: Tolerated procedure ok, no complaints.   Review of  Systems Otherwise negative except as per HPI, including: General = no fevers, chills, dizziness,  fatigue HEENT/EYES = negative for loss of vision, double vision, blurred vision,  sore throa Cardiovascular= negative for chest pain, palpitation Respiratory/lungs= negative for shortness of breath, cough, wheezing; hemoptysis,  Gastrointestinal= negative for  nausea, vomiting, abdominal pain Genitourinary= negative for Dysuria MSK = Negative for arthralgia, myalgias Neurology= Negative for headache, numbness, tingling  Psychiatry= Negative for suicidal and homocidal ideation Skin= Negative for Rash   Examination:  Constitutional: Not in acute distress Respiratory: Clear to auscultation bilaterally Cardiovascular: Normal sinus rhythm, no rubs Abdomen: Nontender nondistended good bowel sounds Musculoskeletal: No edema noted Skin: No rashes seen Neurologic: CN 2-12 grossly intact.  And nonfocal Psychiatric: Normal judgment and insight. Alert and oriented x 3. Normal mood.      Objective: Vitals:   05/06/21 0840 05/06/21 0843 05/06/21 0935 05/06/21 1132  BP: (!) 195/74 (!) 124/34 136/66 (!) 146/66  Pulse: 75 73 65 68  Resp: 17 (!) 25 18 (!) 22  Temp:   98.8 F (37.1 C) 99.2 F (37.3 C)  TempSrc:   Oral Oral  SpO2: 98% 94% 98%   Weight:      Height:        Intake/Output Summary (Last 24 hours) at 05/06/2021 1144 Last data filed at 05/06/2021 1130 Gross per 24 hour  Intake 848.61 ml  Output 850 ml  Net -1.39 ml   Filed Weights   05/02/21 0215  Weight: 63.7 kg     Data Reviewed:   CBC: Recent Labs  Lab 05/02/21 0226 05/03/21 0337 05/03/21 1528 05/04/21 0445 05/05/21 0147 05/06/21 0149  WBC 12.4* 10.8*  --  12.7* 14.4* 16.8*  NEUTROABS 9.5*  --   --   --   --   --   HGB 7.4* 6.0* 7.4* 8.5* 8.8* 9.8*  HCT 23.4* 19.5* 22.9* 26.4* 27.2* 30.4*  MCV 83.3 84.4  --  83.5 83.7 84.7  PLT 328 275  --  298 327 357   Basic Metabolic Panel: Recent Labs  Lab 05/02/21 0226 05/03/21 0337 05/04/21 0445 05/05/21 0147 05/06/21 0149  NA 138 134* 134* 136 134*  K 3.5 3.8 3.6 4.3 3.8  CL 107 102 105 107 103  CO2 21* 23 24 26 25   GLUCOSE 165* 104* 70 156* 220*  BUN 44* 24* 11 9 5*  CREATININE 0.74 0.63 0.57 0.62 0.50  CALCIUM 8.5* 7.8* 7.7* 8.1* 8.3*   GFR: Estimated Creatinine Clearance: 60.5 mL/min (by C-G formula  based on SCr of 0.5 mg/dL). Liver Function Tests: Recent Labs  Lab 05/02/21 0226  AST 30  ALT 24  ALKPHOS 77  BILITOT 0.7  PROT 6.4*  ALBUMIN 3.1*   Recent Labs  Lab 05/02/21 0226  LIPASE 26   No results for input(s): AMMONIA in the last 168 hours. Coagulation Profile: No results for input(s): INR, PROTIME in the last 168 hours. Cardiac Enzymes: No results for input(s): CKTOTAL, CKMB, CKMBINDEX, TROPONINI in the last 168 hours. BNP (last 3 results) No results for input(s): PROBNP in the last 8760 hours. HbA1C: No results for input(s): HGBA1C in the last 72 hours.  CBG: Recent Labs  Lab 05/05/21 1607 05/05/21 2118 05/06/21 0617 05/06/21 0911 05/06/21 1136  GLUCAP 232* 184* 222* 174* 259*   Lipid Profile: No results for input(s): CHOL, HDL, LDLCALC, TRIG, CHOLHDL, LDLDIRECT in the last 72 hours. Thyroid Function Tests: No results for input(s): TSH, T4TOTAL, FREET4, T3FREE, THYROIDAB in the last 72  hours. Anemia Panel: No results for input(s): VITAMINB12, FOLATE, FERRITIN, TIBC, IRON, RETICCTPCT in the last 72 hours.  Sepsis Labs: Recent Labs  Lab 05/02/21 1115 05/03/21 0337 05/04/21 0445  PROCALCITON <0.10 <0.10 <0.10    Recent Results (from the past 240 hour(s))  Resp Panel by RT-PCR (Flu A&B, Covid) Nasopharyngeal Swab     Status: Abnormal   Collection Time: 05/02/21  9:54 AM   Specimen: Nasopharyngeal Swab; Nasopharyngeal(NP) swabs in vial transport medium  Result Value Ref Range Status   SARS Coronavirus 2 by RT PCR NEGATIVE NEGATIVE Final    Comment: (NOTE) SARS-CoV-2 target nucleic acids are NOT DETECTED.  The SARS-CoV-2 RNA is generally detectable in upper respiratory specimens during the acute phase of infection. The lowest concentration of SARS-CoV-2 viral copies this assay can detect is 138 copies/mL. A negative result does not preclude SARS-Cov-2 infection and should not be used as the sole basis for treatment or other patient management  decisions. A negative result may occur with  improper specimen collection/handling, submission of specimen other than nasopharyngeal swab, presence of viral mutation(s) within the areas targeted by this assay, and inadequate number of viral copies(<138 copies/mL). A negative result must be combined with clinical observations, patient history, and epidemiological information. The expected result is Negative.  Fact Sheet for Patients:  BloggerCourse.com  Fact Sheet for Healthcare Providers:  SeriousBroker.it  This test is no t yet approved or cleared by the [place] FDA and  has been authorized for detection and/or diagnosis of SARS-CoV-2 by FDA under an Emergency Use Authorization (EUA). This EUA will remain  in effect (meaning this test can be used) for the duration of the COVID-19 declaration under Section 564(b)(1) of the Act, 21 U.S.C.section 360bbb-3(b)(1), unless the authorization is terminated  or revoked sooner.       Influenza A by PCR POSITIVE (A) NEGATIVE Final   Influenza B by PCR NEGATIVE NEGATIVE Final    Comment: (NOTE) The Xpert Xpress SARS-CoV-2/FLU/RSV plus assay is intended as an aid in the diagnosis of influenza from Nasopharyngeal swab specimens and should not be used as a sole basis for treatment. Nasal washings and aspirates are unacceptable for Xpert Xpress SARS-CoV-2/FLU/RSV testing.  Fact Sheet for Patients: BloggerCourse.com  Fact Sheet for Healthcare Providers: SeriousBroker.it  This test is not yet approved or cleared by the [place] FDA and has been authorized for detection and/or diagnosis of SARS-CoV-2 by FDA under an Emergency Use Authorization (EUA). This EUA will remain in effect (meaning this test can be used) for the duration of the COVID-19 declaration under Section 564(b)(1) of the Act, 21 U.S.C. section 360bbb-3(b)(1), unless  the authorization is terminated or revoked.  Performed at [facility], [address] [place], [place] [address]   Resp Panel by RT-PCR (Flu A&B, Covid) Nasopharyngeal Swab     Status: Abnormal   Collection Time: 05/02/21  2:50 PM   Specimen: Nasopharyngeal Swab; Nasopharyngeal(NP) swabs in vial transport medium  Result Value Ref Range Status   SARS Coronavirus 2 by RT PCR NEGATIVE NEGATIVE Final    Comment: (NOTE) SARS-CoV-2 target nucleic acids are NOT DETECTED.  The SARS-CoV-2 RNA is generally detectable in upper respiratory specimens during the acute phase of infection. The lowest concentration of SARS-CoV-2 viral copies this assay can detect is 138 copies/mL. A negative result does not preclude SARS-Cov-2 infection and should not be used as the sole basis for treatment or other patient management decisions. A negative result may occur with  improper specimen collection/handling, submission of specimen other than nasopharyngeal swab, presence of viral mutation(s) within the areas targeted by this assay, and inadequate number of viral copies(<138 copies/mL). A negative result must be combined with clinical observations, patient history, and epidemiological information. The expected result is Negative.  Fact Sheet for Patients:  BloggerCourse.com  Fact Sheet for Healthcare Providers:  SeriousBroker.it  This test is no t yet approved or cleared by the [place] FDA and  has been authorized for detection and/or diagnosis of SARS-CoV-2 by FDA under an Emergency Use Authorization (EUA). This EUA will remain  in effect (meaning this test can be used) for the duration of the COVID-19 declaration under Section 564(b)(1) of the Act, 21 U.S.C.section 360bbb-3(b)(1), unless the authorization is terminated  or revoked sooner.       Influenza A by PCR POSITIVE (A) NEGATIVE Final   Influenza B by PCR NEGATIVE NEGATIVE Final     Comment: (NOTE) The Xpert Xpress SARS-CoV-2/FLU/RSV plus assay is intended as an aid in the diagnosis of influenza from Nasopharyngeal swab specimens and should not be used as a sole basis for treatment. Nasal washings and aspirates are unacceptable for Xpert Xpress SARS-CoV-2/FLU/RSV testing.  Fact Sheet for Patients: BloggerCourse.com  Fact Sheet for Healthcare Providers: SeriousBroker.it  This test is not yet approved or cleared by the [place] FDA and has been authorized for detection and/or diagnosis of SARS-CoV-2 by FDA under an Emergency Use Authorization (EUA). This EUA will remain in effect (meaning this test can be used) for the duration of the COVID-19 declaration under Section 564(b)(1) of the Act, 21 U.S.C. section 360bbb-3(b)(1), unless the authorization is terminated or revoked.  Performed at [facility], [address], [place], [place] [address]   Surgical pcr screen     Status: Abnormal   Collection Time: 05/06/21  5:04 AM   Specimen: Nasal Mucosa; Nasal Swab  Result Value Ref Range Status   MRSA, PCR POSITIVE (A) NEGATIVE Final    Comment: RESULT CALLED TO, READ BACK BY AND VERIFIED WITH: A GANNER,RN@0711  05/06/21 MK    Staphylococcus aureus POSITIVE (A) NEGATIVE Final    Comment: (NOTE) The Xpert SA Assay (FDA approved for NASAL specimens in patients 37 years of age and older), is one component of a comprehensive surveillance program. It is not intended to diagnose infection nor to guide or monitor treatment. Performed at [facility], [address] [place], [place] [address]          Radiology Studies: No results found.      Scheduled Meds:  sodium chloride   Intravenous Once   amLODipine  10 mg Oral Daily   atorvastatin  80 mg Oral Daily   carvedilol  25 mg Oral BID   Chlorhexidine Gluconate Cloth  6 each Topical Q0600   [START ON 05/07/2021] ferrous sulfate  325  mg Oral BID WC   hydrALAZINE  25 mg Oral TID   insulin aspart  0-15 Units Subcutaneous TID WC   insulin aspart  0-5 Units Subcutaneous QHS   mupirocin ointment  1 application Nasal BID   oseltamivir  75 mg Oral BID   pantoprazole  40 mg Oral BID   senna-docusate  1 tablet Oral QHS   Continuous Infusions:  promethazine (PHENERGAN) injection (IM or IVPB) Stopped (05/04/21 0544)     LOS: 4 days   Time spent= 35 mins    Christien Frankl Joline Maxcy, MD [facility]  If 7PM-7AM, please contact night-coverage  05/06/2021, 11:44 AM

## 2021-05-06 NOTE — Anesthesia Preprocedure Evaluation (Signed)
Anesthesia Evaluation  Patient identified by MRN, date of birth, ID band Patient awake    Reviewed: Allergy & Precautions, NPO status , Patient's Chart, lab work & pertinent test results  Airway Mallampati: II  TM Distance: >3 FB Neck ROM: Full    Dental  (+) Edentulous Upper, Edentulous Lower   Pulmonary neg pulmonary ROS, former smoker,    Pulmonary exam normal        Cardiovascular hypertension, Pt. on medications and Pt. on home beta blockers +CHF  + pacemaker + Cardiac Defibrillator (2015)  Rhythm:Regular Rate:Normal     Neuro/Psych CVA (08/19, right hemiparesis), Residual Symptoms negative psych ROS   GI/Hepatic negative GI ROS, Neg liver ROS,   Endo/Other  diabetes, Type 2, Insulin Dependent  Renal/GU negative Renal ROS  negative genitourinary   Musculoskeletal  (+) Arthritis ,   Abdominal (+)  Abdomen: soft. Bowel sounds: normal.  Peds  Hematology  (+) anemia ,   Anesthesia Other Findings   Reproductive/Obstetrics                             Anesthesia Physical Anesthesia Plan  ASA: 3  Anesthesia Plan: MAC   Post-op Pain Management:    Induction: Intravenous  PONV Risk Score and Plan: 2 and Propofol infusion  Airway Management Planned: Simple Face Mask, Natural Airway and Nasal Cannula  Additional Equipment: None  Intra-op Plan:   Post-operative Plan:   Informed Consent: I have reviewed the patients History and Physical, chart, labs and discussed the procedure including the risks, benefits and alternatives for the proposed anesthesia with the patient or authorized representative who has indicated his/her understanding and acceptance.     Dental advisory given  Plan Discussed with: CRNA  Anesthesia Plan Comments: (ECHO 2021: Left ventricle cavity is normal in size. Mild concentric hypertrophy of  the left ventricle. Normal LV systolic function with visual EF  50-55%.  Normal global wall motion. Doppler evidence of grade I (impaired)  diastolic dysfunction, normal LAP.  Left atrial cavity is mildly dilated.  Trileaflet aortic valve. Trace aortic stenosis. Mild (Grade I) aortic  regurgitation.  Moderate, anteriorly directed, eccentric mitral regurgitation.  Mild tricuspid regurgitation. Estimated pulmonary artery systolic pressure  is 31 mmHg.  Mildly elevated PASP new since 2017.  Lab Results      Component                Value               Date                      WBC                      16.8 (H)            05/06/2021                HGB                      9.8 (L)             05/06/2021                HCT                      30.4 (L)            05/06/2021  MCV                      84.7                05/06/2021                PLT                      357                 05/06/2021           Lab Results      Component                Value               Date                      NA                       134 (L)             05/06/2021                K                        3.8                 05/06/2021                CO2                      25                  05/06/2021                GLUCOSE                  220 (H)             05/06/2021                BUN                      5 (L)               05/06/2021                CREATININE               0.50                05/06/2021                CALCIUM                  8.3 (L)             05/06/2021                GFRNONAA                 >60                 05/06/2021                GFRAA                    >60  09/23/2019          )        Anesthesia Quick Evaluation

## 2021-05-06 NOTE — Transfer of Care (Signed)
Immediate Anesthesia Transfer of Care Note  Patient: Linda Lowe  Procedure(s) Performed: ESOPHAGOGASTRODUODENOSCOPY (EGD) WITH PROPOFOL (Left) COLONOSCOPY WITH PROPOFOL (Left) BIOPSY  Patient Location: Endoscopy Unit  Anesthesia Type:MAC  Level of Consciousness: sedated  Airway & Oxygen Therapy: Patient Spontanous Breathing  Post-op Assessment: Report given to RN and Post -op Vital signs reviewed and stable  Post vital signs: Reviewed and stable  Last Vitals:  Vitals Value Taken Time  BP 165/68 05/06/21 0826  Temp    Pulse 80 05/06/21 0826  Resp 22 05/06/21 0826  SpO2 98 % 05/06/21 0826    Last Pain:  Vitals:   05/06/21 0716  TempSrc: Oral  PainSc: 6       Patients Stated Pain Goal: 0 (97/41/63 [id number])  Complications: No notable events documented.

## 2021-05-07 ENCOUNTER — Encounter (HOSPITAL_COMMUNITY): Payer: Self-pay | Admitting: Gastroenterology

## 2021-05-07 LAB — BASIC METABOLIC PANEL
Anion gap: 6 (ref 5–15)
BUN: 9 mg/dL (ref 8–23)
CO2: 26 mmol/L (ref 22–32)
Calcium: 8.4 mg/dL — ABNORMAL LOW (ref 8.9–10.3)
Chloride: 101 mmol/L (ref 98–111)
Creatinine, Ser: 0.59 mg/dL (ref 0.44–1.00)
GFR, Estimated: >60 mL/min (ref >60)
Glucose, Bld: 210 mg/dL — ABNORMAL HIGH (ref 70–99)
Potassium: 3.9 mmol/L (ref 3.5–5.1)
Sodium: 133 mmol/L — ABNORMAL LOW (ref 135–145)

## 2021-05-07 LAB — CBC
HCT: 28.4 % — ABNORMAL LOW (ref 36.0–46.0)
Hemoglobin: 9.2 g/dL — ABNORMAL LOW (ref 12.0–15.0)
MCH: 27.7 pg (ref 26.0–34.0)
MCHC: 32.4 g/dL (ref 30.0–36.0)
MCV: 85.5 fL (ref 80.0–100.0)
Platelets: 299 10*3/uL (ref 150–400)
RBC: 3.32 MIL/uL — ABNORMAL LOW (ref 3.87–5.11)
RDW: 18 % — ABNORMAL HIGH (ref 11.5–15.5)
WBC: 13.4 10*3/uL — ABNORMAL HIGH (ref 4.0–10.5)
nRBC: 0.5 % — ABNORMAL HIGH (ref 0.0–0.2)

## 2021-05-07 LAB — GLUCOSE, CAPILLARY
Glucose-Capillary: 196 mg/dL — ABNORMAL HIGH (ref 70–99)
Glucose-Capillary: 243 mg/dL — ABNORMAL HIGH (ref 70–99)
Glucose-Capillary: 250 mg/dL — ABNORMAL HIGH (ref 70–99)
Glucose-Capillary: 238 mg/dL — ABNORMAL HIGH (ref 70–99)
Glucose-Capillary: 194 mg/dL — ABNORMAL HIGH (ref 70–99)

## 2021-05-07 MED ORDER — GERHARDT'S BUTT CREAM
TOPICAL_CREAM | Freq: Two times a day (BID) | CUTANEOUS | Status: DC
  Filled 2021-05-07: qty 1

## 2021-05-07 MED ORDER — ISOSORBIDE DINITRATE 30 MG PO TABS
30.0000 mg | ORAL_TABLET | Freq: Three times a day (TID) | ORAL | Status: DC
  Administered 2021-05-07 – 2021-05-08 (×4): 30 mg via ORAL
  Filled 2021-05-07 (×6): qty 1

## 2021-05-07 NOTE — Progress Notes (Signed)
PROGRESS NOTE  Linda Lowe PVV:[id number] DOB: November 11, 1950 DOA: 05/02/2021 PCP: Renaye Rakers, MD   LOS: 4 days   Brief Narrative / Interim history: 70 y.o. F with NICM and sCHF EF previously 20-30s now 55%, BiV PPM and ICD, HTN, IDDM, and hx stroke with residual R-sided weakness, now wheelchair bound who presented with 3 days progressive malaise, recurrent vomiting and cough. In the ER, found to have influenza.  Dehydrated, vomiting. Of note, incidentally noted to have new anemia.  Husband confirmed BRBPR.  GI team was consulted, found to be iron deficient and therefore IV iron given followed by p.o. supplements and bowel regimen.  EGD showed normal esophagus, 3 cm hiatal hernia, nonbleeding gastric and duodenal ulcer recommended PPI twice daily for 8 weeks followed by daily and repeat scope in about 8 weeks.  We will also need 2 weeks of sucralfate.  Colonoscopy showed diverticulosis and nonbleeding hemorrhoid.  Subjective / 24h Interval events: She is doing well this morning, still coughing and some intermittent shortness of breath  Assessment & Plan: Principal Problem Acute blood loss anemia, iron deficiency anemia due to PUD -Her hospital stay has been complicated by GI bleed.  Gastroenterology consulted and followed patient while hospitalized.  She underwent an EGD which showed 3 cm hiatal hernia as well as gastric and duodenal ulcers which were not bleeding at the time of the endoscopy.  Colonoscopy revealed diverticulosis and nonbleeding hemorrhoids.  She is to be placed on PPI twice daily for 8 weeks, sucralfate 1 g 4 times daily for 2 weeks, followed by daily and repeat endoscopy in couple of months. Serology for H. pylori will be followed up by GI team as an outpatient -Closely monitor CBC, anticipate home discharge tomorrow if remains stable  Active Problems Left lower lobe pneumonia due to influenza, intractable nausea and vomiting, dehydration-completed course of Tamiflu, he is  feeling a little bit stronger.  Still has some dyspnea but overall improving  Leukocytosis-got worse up to 16.8 on 8/21, now improving.  She is afebrile.  Chronic systolic CHF, NICM, pacemaker, essential hypertension -currently she is on Norvasc, Coreg, hydralazine.  Furosemide, irbesartan, Isordil are on hold.  -Resume Isordil today, hold Norvasc to avoid hypotension  Prior CVA with residual right-sided weakness, mostly wheelchair bound-continue atorvastatin  DM2, poorly controlled, with hyperglycemia-A1c 8.0, continue sliding scale  CBG (last 3)  Recent Labs    05/06/21 2159 05/07/21 0608 05/07/21 0751  GLUCAP 210* 196* 243*    Scheduled Meds:  sodium chloride   Intravenous Once   amLODipine  10 mg Oral Daily   atorvastatin  80 mg Oral Daily   carvedilol  25 mg Oral BID   Chlorhexidine Gluconate Cloth  6 each Topical Q0600   ferrous sulfate  325 mg Oral BID WC   hydrALAZINE  25 mg Oral TID   insulin aspart  0-15 Units Subcutaneous TID WC   insulin aspart  0-5 Units Subcutaneous QHS   mupirocin ointment  1 application Nasal BID   pantoprazole  40 mg Oral BID   senna-docusate  1 tablet Oral QHS   sucralfate  1 g Oral TID WC & HS   Continuous Infusions: PRN Meds:.acetaminophen **OR** acetaminophen, guaiFENesin-dextromethorphan, ipratropium-albuterol, morphine injection, ondansetron (ZOFRAN) IV, promethazine **OR** [DISCONTINUED] promethazine (PHENERGAN) injection (IM or IVPB), traZODone  Diet Orders (From admission, onward)     Start     Ordered   05/06/21 0905  Diet heart healthy/carb modified Room service appropriate? Yes; Fluid consistency: Thin  Diet  effective now       Question Answer Comment  Diet-HS Snack? Nothing   Room service appropriate? Yes   Fluid consistency: Thin      05/06/21 0904            DVT prophylaxis: Place and maintain sequential compression device Start: 05/03/21 0716     Code Status: Full Code  Family Communication: husband over the  phone   Status is: Inpatient  Remains inpatient appropriate because:Inpatient level of care appropriate due to severity of illness  Dispo: The patient is from: Home              Anticipated d/c is to: Home              Patient currently is not medically stable to d/c.   Difficult to place patient No  Level of care: Med-Surg  Consultants:  GI  Procedures:  EGD 8/21 Colonoscopy 8/21  Microbiology  none  Antimicrobials: none    Objective: Vitals:   05/06/21 2040 05/07/21 0516 05/07/21 0751 05/07/21 1048  BP: 134/64 (!) 123/59 (!) 120/52 (!) 122/58  Pulse: 61 63 63   Resp: 18 16 16    Temp: 98.9 F (37.2 C) 99.7 F (37.6 C) 99.5 F (37.5 C)   TempSrc: Oral Oral Oral   SpO2: 95% 96% 97%   Weight:      Height:        Intake/Output Summary (Last 24 hours) at 05/07/2021 1055 Last data filed at 05/07/2021 0908 Gross per 24 hour  Intake 356 ml  Output 450 ml  Net -94 ml   Filed Weights   05/02/21 0215  Weight: 63.7 kg    Examination:  Constitutional: NAD Eyes: no scleral icterus ENMT: Mucous membranes are moist.  Neck: normal, supple Respiratory: Diminished at the bases, faint rhonchi.  No wheezing or crackles Cardiovascular: Regular rate and rhythm, no murmurs / rubs / gallops. No LE edema.  Abdomen: non distended, no tenderness. Bowel sounds positive.  Musculoskeletal: no clubbing / cyanosis.  Skin: no rashes Neurologic: CN 2-12 grossly intact.  Residual right-sided weakness Psychiatric: Normal judgment and insight. Alert and oriented x 3. Normal mood.   Data Reviewed: I have independently reviewed following labs and imaging studies   CBC: Recent Labs  Lab 05/02/21 0226 05/03/21 0337 05/03/21 1528 05/04/21 0445 05/05/21 0147 05/06/21 0149 05/07/21 0200  WBC 12.4* 10.8*  --  12.7* 14.4* 16.8* 13.4*  NEUTROABS 9.5*  --   --   --   --   --   --   HGB 7.4* 6.0* 7.4* 8.5* 8.8* 9.8* 9.2*  HCT 23.4* 19.5* 22.9* 26.4* 27.2* 30.4* 28.4*  MCV 83.3 84.4   --  83.5 83.7 84.7 85.5  PLT 328 275  --  298 327 357 299   Basic Metabolic Panel: Recent Labs  Lab 05/03/21 0337 05/04/21 0445 05/05/21 0147 05/06/21 0149 05/07/21 0200  NA 134* 134* 136 134* 133*  K 3.8 3.6 4.3 3.8 3.9  CL 102 105 107 103 101  CO2 23 24 26 25 26   GLUCOSE 104* 70 156* 220* 210*  BUN 24* 11 9 5* 9  CREATININE 0.63 0.57 0.62 0.50 0.59  CALCIUM 7.8* 7.7* 8.1* 8.3* 8.4*   Liver Function Tests: Recent Labs  Lab 05/02/21 0226  AST 30  ALT 24  ALKPHOS 77  BILITOT 0.7  PROT 6.4*  ALBUMIN 3.1*   Coagulation Profile: No results for input(s): INR, PROTIME in the last 168 hours. HbA1C: No  results for input(s): HGBA1C in the last 72 hours. CBG: Recent Labs  Lab 05/06/21 1136 05/06/21 1608 05/06/21 2159 05/07/21 0608 05/07/21 0751  GLUCAP 259* 176* 210* 196* 243*    Recent Results (from the past 240 hour(s))  Resp Panel by RT-PCR (Flu A&B, Covid) Nasopharyngeal Swab     Status: Abnormal   Collection Time: 05/02/21  9:54 AM   Specimen: Nasopharyngeal Swab; Nasopharyngeal(NP) swabs in vial transport medium  Result Value Ref Range Status   SARS Coronavirus 2 by RT PCR NEGATIVE NEGATIVE Final    Comment: (NOTE) SARS-CoV-2 target nucleic acids are NOT DETECTED.  The SARS-CoV-2 RNA is generally detectable in upper respiratory specimens during the acute phase of infection. The lowest concentration of SARS-CoV-2 viral copies this assay can detect is 138 copies/mL. A negative result does not preclude SARS-Cov-2 infection and should not be used as the sole basis for treatment or other patient management decisions. A negative result may occur with  improper specimen collection/handling, submission of specimen other than nasopharyngeal swab, presence of viral mutation(s) within the areas targeted by this assay, and inadequate number of viral copies(<138 copies/mL). A negative result must be combined with clinical observations, patient history, and  epidemiological information. The expected result is Negative.  Fact Sheet for Patients:  BloggerCourse.com  Fact Sheet for Healthcare Providers:  SeriousBroker.it  This test is no t yet approved or cleared by the [place] FDA and  has been authorized for detection and/or diagnosis of SARS-CoV-2 by FDA under an Emergency Use Authorization (EUA). This EUA will remain  in effect (meaning this test can be used) for the duration of the COVID-19 declaration under Section 564(b)(1) of the Act, 21 U.S.C.section 360bbb-3(b)(1), unless the authorization is terminated  or revoked sooner.       Influenza A by PCR POSITIVE (A) NEGATIVE Final   Influenza B by PCR NEGATIVE NEGATIVE Final    Comment: (NOTE) The Xpert Xpress SARS-CoV-2/FLU/RSV plus assay is intended as an aid in the diagnosis of influenza from Nasopharyngeal swab specimens and should not be used as a sole basis for treatment. Nasal washings and aspirates are unacceptable for Xpert Xpress SARS-CoV-2/FLU/RSV testing.  Fact Sheet for Patients: BloggerCourse.com  Fact Sheet for Healthcare Providers: SeriousBroker.it  This test is not yet approved or cleared by the [place] FDA and has been authorized for detection and/or diagnosis of SARS-CoV-2 by FDA under an Emergency Use Authorization (EUA). This EUA will remain in effect (meaning this test can be used) for the duration of the COVID-19 declaration under Section 564(b)(1) of the Act, 21 U.S.C. section 360bbb-3(b)(1), unless the authorization is terminated or revoked.  Performed at [facility], [address] [place], [place] [address]   Resp Panel by RT-PCR (Flu A&B, Covid) Nasopharyngeal Swab     Status: Abnormal   Collection Time: 05/02/21  2:50 PM   Specimen: Nasopharyngeal Swab; Nasopharyngeal(NP) swabs in vial transport medium  Result Value Ref Range  Status   SARS Coronavirus 2 by RT PCR NEGATIVE NEGATIVE Final    Comment: (NOTE) SARS-CoV-2 target nucleic acids are NOT DETECTED.  The SARS-CoV-2 RNA is generally detectable in upper respiratory specimens during the acute phase of infection. The lowest concentration of SARS-CoV-2 viral copies this assay can detect is 138 copies/mL. A negative result does not preclude SARS-Cov-2 infection and should not be used as the sole basis for treatment or other patient management decisions. A negative result may occur with  improper specimen collection/handling, submission of  specimen other than nasopharyngeal swab, presence of viral mutation(s) within the areas targeted by this assay, and inadequate number of viral copies(<138 copies/mL). A negative result must be combined with clinical observations, patient history, and epidemiological information. The expected result is Negative.  Fact Sheet for Patients:  BloggerCourse.com  Fact Sheet for Healthcare Providers:  SeriousBroker.it  This test is no t yet approved or cleared by the [place] FDA and  has been authorized for detection and/or diagnosis of SARS-CoV-2 by FDA under an Emergency Use Authorization (EUA). This EUA will remain  in effect (meaning this test can be used) for the duration of the COVID-19 declaration under Section 564(b)(1) of the Act, 21 U.S.C.section 360bbb-3(b)(1), unless the authorization is terminated  or revoked sooner.       Influenza A by PCR POSITIVE (A) NEGATIVE Final   Influenza B by PCR NEGATIVE NEGATIVE Final    Comment: (NOTE) The Xpert Xpress SARS-CoV-2/FLU/RSV plus assay is intended as an aid in the diagnosis of influenza from Nasopharyngeal swab specimens and should not be used as a sole basis for treatment. Nasal washings and aspirates are unacceptable for Xpert Xpress SARS-CoV-2/FLU/RSV testing.  Fact Sheet for  Patients: BloggerCourse.com  Fact Sheet for Healthcare Providers: SeriousBroker.it  This test is not yet approved or cleared by the [place] FDA and has been authorized for detection and/or diagnosis of SARS-CoV-2 by FDA under an Emergency Use Authorization (EUA). This EUA will remain in effect (meaning this test can be used) for the duration of the COVID-19 declaration under Section 564(b)(1) of the Act, 21 U.S.C. section 360bbb-3(b)(1), unless the authorization is terminated or revoked.  Performed at [facility], [address], [place], [place] [address]   Surgical pcr screen     Status: Abnormal   Collection Time: 05/06/21  5:04 AM   Specimen: Nasal Mucosa; Nasal Swab  Result Value Ref Range Status   MRSA, PCR POSITIVE (A) NEGATIVE Final    Comment: RESULT CALLED TO, READ BACK BY AND VERIFIED WITH: A GANNER,RN@0711  05/06/21 MK    Staphylococcus aureus POSITIVE (A) NEGATIVE Final    Comment: (NOTE) The Xpert SA Assay (FDA approved for NASAL specimens in patients 48 years of age and older), is one component of a comprehensive surveillance program. It is not intended to diagnose infection nor to guide or monitor treatment. Performed at [facility], [address], [place], [place] [address]      Radiology Studies: No results found.   Pamella Pert, MD, PhD [facility]  Between 7 am - 7 pm I am available, please contact me via Amion (for emergencies) or Securechat (non urgent messages)  Between 7 pm - 7 am I am not available, please contact night coverage MD/APP via Amion

## 2021-05-07 NOTE — Progress Notes (Signed)
Inpatient Diabetes Program Recommendations  AACE/ADA: New Consensus Statement on Inpatient Glycemic Control (2015)  Target Ranges:  Prepandial:   less than 140 mg/dL      Peak postprandial:   less than 180 mg/dL (1-2 hours)      Critically ill patients:  140 - 180 mg/dL   Lab Results  Component Value Date   GLUCAP 250 (H) 05/07/2021   HGBA1C 8.0 (H) 05/03/2021    Review of Glycemic Control Results for Linda Lowe, Linda Lowe (MRN [id number]) as of 05/07/2021 13:27  Ref. Range 05/07/2021 06:08 05/07/2021 07:51 05/07/2021 12:32  Glucose-Capillary Latest Ref Range: 70 - 99 mg/dL 283 (H) 662 (H) 947 (H)   Diabetes history: Type 2 DM Outpatient Diabetes medications: [phone] mg BID, Victoza 1.8 mg QD, Novolog 16 units BID Current orders for Inpatient glycemic control: Novolog 0-15 units TID, Novolog 0-5 units QHS  Inpatient Diabetes Program Recommendations:    Consider adding Levemir 8 units Q24H.   Thanks, Lujean Rave, MSN, RNC-OB Diabetes Coordinator [phone] (8a-5p)

## 2021-05-08 DIAGNOSIS — J189 Pneumonia, unspecified organism: Secondary | ICD-10-CM

## 2021-05-08 DIAGNOSIS — K269 Duodenal ulcer, unspecified as acute or chronic, without hemorrhage or perforation: Secondary | ICD-10-CM

## 2021-05-08 LAB — CBC
HCT: 27.7 % — ABNORMAL LOW (ref 36.0–46.0)
Hemoglobin: 8.8 g/dL — ABNORMAL LOW (ref 12.0–15.0)
MCH: 27.6 pg (ref 26.0–34.0)
MCHC: 31.8 g/dL (ref 30.0–36.0)
MCV: 86.8 fL (ref 80.0–100.0)
Platelets: 254 10*3/uL (ref 150–400)
RBC: 3.19 MIL/uL — ABNORMAL LOW (ref 3.87–5.11)
RDW: 17.9 % — ABNORMAL HIGH (ref 11.5–15.5)
WBC: 12 10*3/uL — ABNORMAL HIGH (ref 4.0–10.5)
nRBC: 0.2 % (ref 0.0–0.2)

## 2021-05-08 LAB — GLUCOSE, CAPILLARY
Glucose-Capillary: 231 mg/dL — ABNORMAL HIGH (ref 70–99)
Glucose-Capillary: 242 mg/dL — ABNORMAL HIGH (ref 70–99)

## 2021-05-08 LAB — BASIC METABOLIC PANEL
Anion gap: 5 (ref 5–15)
BUN: 8 mg/dL (ref 8–23)
CO2: 27 mmol/L (ref 22–32)
Calcium: 8 mg/dL — ABNORMAL LOW (ref 8.9–10.3)
Chloride: 102 mmol/L (ref 98–111)
Creatinine, Ser: 0.65 mg/dL (ref 0.44–1.00)
GFR, Estimated: >60 mL/min (ref >60)
Glucose, Bld: 211 mg/dL — ABNORMAL HIGH (ref 70–99)
Potassium: 4.1 mmol/L (ref 3.5–5.1)
Sodium: 134 mmol/L — ABNORMAL LOW (ref 135–145)

## 2021-05-08 LAB — SURGICAL PATHOLOGY

## 2021-05-08 LAB — PATHOLOGIST SMEAR REVIEW

## 2021-05-08 MED ORDER — FERROUS SULFATE 325 (65 FE) MG PO TABS: 325.0000 mg | ORAL_TABLET | Freq: Two times a day (BID) | ORAL | 1 refills | Status: AC

## 2021-05-08 MED ORDER — SUCRALFATE 1 GM/10ML PO SUSP: 1.0000 g | Freq: Three times a day (TID) | ORAL | 0 refills | Status: DC

## 2021-05-08 MED ORDER — PANTOPRAZOLE SODIUM 40 MG PO TBEC: 40.0000 mg | DELAYED_RELEASE_TABLET | Freq: Two times a day (BID) | ORAL | 1 refills | Status: DC

## 2021-05-08 NOTE — Plan of Care (Signed)

## 2021-05-08 NOTE — Discharge Summary (Signed)
Physician Discharge Summary  Linda Lowe DTO:[id number] DOB: 31-Dec-1950 DOA: 05/02/2021  PCP: Renaye Rakers, MD  Admit date: 05/02/2021 Discharge date: 05/08/2021  Admitted From: home Disposition:  home  Recommendations for Outpatient Follow-up:  Follow up with PCP in 1-2 weeks Please obtain BMP/CBC in one week  Home Health: none Equipment/Devices: none  Discharge Condition: stable CODE STATUS: Full code Diet recommendation: heart healthy  HPI: Per admitting MD, Linda Lowe is a 70 y.o. F with hx NICM and sCHF EF previously 20-30s now 55%, BiV PPM and ICD, HTN, IDDM, and hx stroke with residual R-sided weakness, now wheelchair bound who presents with 3 days progressive malaise, recurrent vomiting and cough. Patient has been living in the [place] the last year, visiting family, no new health changes during that time although she has gotten progressively weaker and is now wheelchair bound.  She returned to the [place] with her husband 6 days ago, and was in her normal state of health at that time until about 3 to 4 days ago when she started to develop malaise, fatigue, nausea, and recurrent vomiting.  She also had a dry cough and subjective fevers. In the ER, patient had elevated BUN/creatinine ratio, WBC 12 point 4K, normal heart rate and oxygenation.  Chest x-ray showed patchy left base opacity.  COVID test was negative but she tested positive for influenza A.  She was given antiemetics and the hospitalist service were asked to evaluate for intractable vomiting due to influenza. Of note, incidentally noted to have new anemia.  Hospital Course / Discharge diagnoses: Principal Problem Acute blood loss anemia, iron deficiency anemia due to PUD -she was initially admitted for influenza however her hospital stay has been complicated by GI bleed.  Gastroenterology consulted and followed patient while hospitalized.  She underwent an EGD which showed 3 cm hiatal hernia as well as gastric and  duodenal ulcers which were not bleeding at the time of the endoscopy.  Colonoscopy revealed diverticulosis and nonbleeding hemorrhoids.  She is to be placed on PPI twice daily for 8 weeks, sucralfate 1 g 4 times daily for 2 weeks, followed by daily and repeat endoscopy in couple of months. Serology for H. pylori will be followed up by GI team as an outpatient.  Clinically has remained stable, hemoglobin overall stable without further bleeding.  She will be discharging home with outpatient follow-up   Active Problems Left lower lobe pneumonia due to influenza, intractable nausea and vomiting, dehydration-completed course of Tamiflu, she remains on room air.  She is able to tolerate a regular diet Leukocytosis-improving, afebrile  Chronic systolic CHF, NICM, pacemaker, essential hypertension -resume home medications but hold Norvasc on discharge due to normal blood pressure.  Outpatient follow-up with PCP in a week Prior CVA with residual right-sided weakness, mostly wheelchair bound-continue atorvastatin DM2, poorly controlled, with hyperglycemia-A1c 8.0, continue home regimen  Sepsis ruled out   Discharge Instructions   Allergies as of 05/08/2021   No Known Allergies      Medication List     STOP taking these medications    amLODipine 10 MG tablet Commonly known as: NORVASC       TAKE these medications    atorvastatin 80 MG tablet Commonly known as: LIPITOR TAKE 1 TABLET(80 MG) BY MOUTH DAILY What changed: See the new instructions.   carvedilol 25 MG tablet Commonly known as: COREG Take 25 mg by mouth 2 (two) times daily.   ferrous sulfate 325 (65 FE) MG tablet Take 1  tablet (325 mg total) by mouth 2 (two) times daily with a meal.   furosemide 20 MG tablet Commonly known as: LASIX Take 20 mg by mouth 2 (two) times daily.   glucose blood test strip Commonly known as: Contour Next Test Use as instructed   glucose blood test strip Commonly known as: OneTouch  Verio USE TO TEST BLOOD SUGAR 3 TIMES DAILY   hydrALAZINE 25 MG tablet Commonly known as: APRESOLINE TAKE 1 TABLET(25 MG) BY MOUTH THREE TIMES DAILY What changed: See the new instructions.   Insulin Pen Needle 31G X 5 MM Misc Commonly known as: B-D UF III MINI PEN NEEDLES USE 5 PEN NEEDLES PER DAY AS INSTRUCTED   B-D UF III MINI PEN NEEDLES 31G X 5 MM Misc Generic drug: Insulin Pen Needle USE 5 NEEDLES EVERY DAY AS DIRECTED   Insulin Pen Needle 31G X 5 MM Misc Commonly known as: B-D UF III MINI PEN NEEDLES USE 5 NEEDLES EVERY DAY AS DIRECTED   Invokamet [phone] MG Tabs Generic drug: Canagliflozin-metFORMIN HCl TAKE 1 TABLET BY MOUTH TWICE DAILY What changed: when to take this   irbesartan 300 MG tablet Commonly known as: AVAPRO Take 300 mg by mouth daily.   isosorbide dinitrate 30 MG tablet Commonly known as: ISORDIL TAKE 1 TABLET(30 MG) BY MOUTH THREE TIMES DAILY What changed: See the new instructions.   NovoLOG FlexPen 100 UNIT/ML FlexPen Generic drug: insulin aspart ADMINISTER 16 UNITS UNDER THE SKIN TWICE DAILY BEFORE LUNCH AND SUPPER What changed: See the new instructions.   OneTouch Delica Lancets 33G Misc USE TO TEST BLOOD SUGAR LEVELS THREE TIMES DAILY   pantoprazole 40 MG tablet Commonly known as: PROTONIX Take 1 tablet (40 mg total) by mouth 2 (two) times daily.   potassium chloride SA 20 MEQ tablet Commonly known as: KLOR-CON TAKE 1 TABLET(20 MEQ) BY MOUTH DAILY What changed: See the new instructions.   sucralfate 1 GM/10ML suspension Commonly known as: CARAFATE Take 10 mLs (1 g total) by mouth 4 (four) times daily -  with meals and at bedtime for 14 days.   Victoza 18 MG/3ML Sopn Generic drug: liraglutide INJECT 1.8 MG UNDER THE SKIN DAILY What changed: See the new instructions.        Follow-up Information     Renaye Rakers, MD Follow up in 1 week(s).   Specialty: Family Medicine Contact information: [address] [place]  [address] [phone]                 Consultations: GI  Procedures/Studies: EGD Colonoscopy  DG Chest Port 1 View  Result Date: 05/02/2021 CLINICAL DATA:  Cough EXAM: PORTABLE CHEST 1 VIEW COMPARISON:  Chest radiograph 09/23/2019 FINDINGS: A left chest wall cardiac device and 3 associated leads are stable. The heart is enlarged, unchanged. The mediastinal contours are within normal limits. There is patchy opacity in the left base not seen on the prior study. There is no other focal consolidation. There is no pulmonary edema. There is no pleural effusion or pneumothorax. There is no acute osseous abnormality. IMPRESSION: 1. Patchy opacity in the left base not seen on the prior study could reflect pneumonia in the correct clinical setting. Recommend follow-up PA and lateral radiographs in 6-8 weeks to assess for resolution. 2. Cardiomegaly. Electronically Signed   By: Lesia Hausen M.D.   On: 05/02/2021 08:58     Subjective: - no chest pain, shortness of breath, no abdominal pain, nausea or vomiting.  Discharge Exam: BP 118/68 (BP Location: Left Arm)   Pulse 63   Temp 99.1 F (37.3 C) (Oral)   Resp 18   Ht 5\' 3"  (1.6 m)   Wt 63.7 kg   SpO2 98%   BMI 24.88 kg/m   General: Pt is alert, awake, not in acute distress Cardiovascular: RRR, S1/S2 +, no rubs, no gallops Respiratory: CTA bilaterally, no wheezing, no rhonchi Abdominal: Soft, NT, ND, bowel sounds + Extremities: no edema, no cyanosis   The results of significant diagnostics from this hospitalization (including imaging, microbiology, ancillary and laboratory) are listed below for reference.     Microbiology: Recent Results (from the past 240 hour(s))  Resp Panel by RT-PCR (Flu A&B, Covid) Nasopharyngeal Swab     Status: Abnormal   Collection Time: 05/02/21  9:54 AM   Specimen: Nasopharyngeal Swab; Nasopharyngeal(NP) swabs in vial transport medium  Result Value Ref Range Status   SARS Coronavirus 2 by RT PCR  NEGATIVE NEGATIVE Final    Comment: (NOTE) SARS-CoV-2 target nucleic acids are NOT DETECTED.  The SARS-CoV-2 RNA is generally detectable in upper respiratory specimens during the acute phase of infection. The lowest concentration of SARS-CoV-2 viral copies this assay can detect is 138 copies/mL. A negative result does not preclude SARS-Cov-2 infection and should not be used as the sole basis for treatment or other patient management decisions. A negative result may occur with  improper specimen collection/handling, submission of specimen other than nasopharyngeal swab, presence of viral mutation(s) within the areas targeted by this assay, and inadequate number of viral copies(<138 copies/mL). A negative result must be combined with clinical observations, patient history, and epidemiological information. The expected result is Negative.  Fact Sheet for Patients:  05/04/21  Fact Sheet for Healthcare Providers:  BloggerCourse.com  This test is no t yet approved or cleared by the [place] FDA and  has been authorized for detection and/or diagnosis of SARS-CoV-2 by FDA under an Emergency Use Authorization (EUA). This EUA will remain  in effect (meaning this test can be used) for the duration of the COVID-19 declaration under Section 564(b)(1) of the Act, 21 U.S.C.section 360bbb-3(b)(1), unless the authorization is terminated  or revoked sooner.       Influenza A by PCR POSITIVE (A) NEGATIVE Final   Influenza B by PCR NEGATIVE NEGATIVE Final    Comment: (NOTE) The Xpert Xpress SARS-CoV-2/FLU/RSV plus assay is intended as an aid in the diagnosis of influenza from Nasopharyngeal swab specimens and should not be used as a sole basis for treatment. Nasal washings and aspirates are unacceptable for Xpert Xpress SARS-CoV-2/FLU/RSV testing.  Fact Sheet for Patients: [place]  Fact Sheet for  Healthcare Providers: BloggerCourse.com  This test is not yet approved or cleared by the [place] FDA and has been authorized for detection and/or diagnosis of SARS-CoV-2 by FDA under an Emergency Use Authorization (EUA). This EUA will remain in effect (meaning this test can be used) for the duration of the COVID-19 declaration under Section 564(b)(1) of the Act, 21 U.S.C. section 360bbb-3(b)(1), unless the authorization is terminated or revoked.  Performed at [facility], [address], [place], [place]   Resp Panel by RT-PCR (Flu A&B, Covid) Nasopharyngeal Swab     Status: Abnormal   Collection Time: 05/02/21  2:50 PM   Specimen: Nasopharyngeal Swab; Nasopharyngeal(NP) swabs in vial transport medium  Result Value Ref Range Status   SARS Coronavirus 2 by RT PCR NEGATIVE NEGATIVE Final    Comment: (  NOTE) SARS-CoV-2 target nucleic acids are NOT DETECTED.  The SARS-CoV-2 RNA is generally detectable in upper respiratory specimens during the acute phase of infection. The lowest concentration of SARS-CoV-2 viral copies this assay can detect is 138 copies/mL. A negative result does not preclude SARS-Cov-2 infection and should not be used as the sole basis for treatment or other patient management decisions. A negative result may occur with  improper specimen collection/handling, submission of specimen other than nasopharyngeal swab, presence of viral mutation(s) within the areas targeted by this assay, and inadequate number of viral copies(<138 copies/mL). A negative result must be combined with clinical observations, patient history, and epidemiological information. The expected result is Negative.  Fact Sheet for Patients:  BloggerCourse.com  Fact Sheet for Healthcare Providers:  SeriousBroker.it  This test is no t yet approved or cleared by the [place] FDA and  has been  authorized for detection and/or diagnosis of SARS-CoV-2 by FDA under an Emergency Use Authorization (EUA). This EUA will remain  in effect (meaning this test can be used) for the duration of the COVID-19 declaration under Section 564(b)(1) of the Act, 21 U.S.C.section 360bbb-3(b)(1), unless the authorization is terminated  or revoked sooner.       Influenza A by PCR POSITIVE (A) NEGATIVE Final   Influenza B by PCR NEGATIVE NEGATIVE Final    Comment: (NOTE) The Xpert Xpress SARS-CoV-2/FLU/RSV plus assay is intended as an aid in the diagnosis of influenza from Nasopharyngeal swab specimens and should not be used as a sole basis for treatment. Nasal washings and aspirates are unacceptable for Xpert Xpress SARS-CoV-2/FLU/RSV testing.  Fact Sheet for Patients: BloggerCourse.com  Fact Sheet for Healthcare Providers: SeriousBroker.it  This test is not yet approved or cleared by the [place] FDA and has been authorized for detection and/or diagnosis of SARS-CoV-2 by FDA under an Emergency Use Authorization (EUA). This EUA will remain in effect (meaning this test can be used) for the duration of the COVID-19 declaration under Section 564(b)(1) of the Act, 21 U.S.C. section 360bbb-3(b)(1), unless the authorization is terminated or revoked.  Performed at [facility], [address] [place], [place] [address]   Surgical pcr screen     Status: Abnormal   Collection Time: 05/06/21  5:04 AM   Specimen: Nasal Mucosa; Nasal Swab  Result Value Ref Range Status   MRSA, PCR POSITIVE (A) NEGATIVE Final    Comment: RESULT CALLED TO, READ BACK BY AND VERIFIED WITH: A GANNER,RN@[id number]  05/06/21 MK    Staphylococcus aureus POSITIVE (A) NEGATIVE Final    Comment: (NOTE) The Xpert SA Assay (FDA approved for NASAL specimens in patients 68 years of age and older), is one component of a comprehensive surveillance program. It is not intended  to diagnose infection nor to guide or monitor treatment. Performed at [facility], [address] [place], [place] [address]      Labs: Basic Metabolic Panel: Recent Labs  Lab 05/04/21 0445 05/05/21 0147 05/06/21 0149 05/07/21 0200 05/08/21 0347  NA 134* 136 134* 133* 134*  K 3.6 4.3 3.8 3.9 4.1  CL 105 107 103 101 102  CO2 24 26 25 26 27   GLUCOSE 70 156* 220* 210* 211*  BUN 11 9 5* 9 8  CREATININE 0.57 0.62 0.50 0.59 0.65  CALCIUM 7.7* 8.1* 8.3* 8.4* 8.0*   Liver Function Tests: Recent Labs  Lab 05/02/21 0226  AST 30  ALT 24  ALKPHOS 77  BILITOT 0.7  PROT 6.4*  ALBUMIN  3.1*   CBC: Recent Labs  Lab 05/02/21 0226 05/03/21 0337 05/04/21 0445 05/05/21 0147 05/06/21 0149 05/07/21 0200 05/08/21 0347  WBC 12.4*   < > 12.7* 14.4* 16.8* 13.4* 12.0*  NEUTROABS 9.5*  --   --   --   --   --   --   HGB 7.4*   < > 8.5* 8.8* 9.8* 9.2* 8.8*  HCT 23.4*   < > 26.4* 27.2* 30.4* 28.4* 27.7*  MCV 83.3   < > 83.5 83.7 84.7 85.5 86.8  PLT 328   < > 298 327 357 299 254   < > = values in this interval not displayed.   CBG: Recent Labs  Lab 05/07/21 0751 05/07/21 1232 05/07/21 1551 05/07/21 2132 05/08/21 0607  GLUCAP 243* 250* 238* 194* 231*   Hgb A1c No results for input(s): HGBA1C in the last 72 hours. Lipid Profile No results for input(s): CHOL, HDL, LDLCALC, TRIG, CHOLHDL, LDLDIRECT in the last 72 hours. Thyroid function studies No results for input(s): TSH, T4TOTAL, T3FREE, THYROIDAB in the last 72 hours.  Invalid input(s): FREET3 Urinalysis    Component Value Date/Time   COLORURINE YELLOW 05/02/2021 0903   APPEARANCEUR CLEAR 05/02/2021 0903   LABSPEC 1.018 05/02/2021 0903   PHURINE 5.0 05/02/2021 0903   GLUCOSEU >=500 (A) 05/02/2021 0903   GLUCOSEU >=1000 (A) 11/02/2014 1003   HGBUR NEGATIVE 05/02/2021 0903   BILIRUBINUR NEGATIVE 05/02/2021 0903   KETONESUR 5 (A) 05/02/2021 0903   PROTEINUR NEGATIVE 05/02/2021 0903   UROBILINOGEN 0.2  11/02/2014 1003   NITRITE NEGATIVE 05/02/2021 0903   LEUKOCYTESUR TRACE (A) 05/02/2021 0903    FURTHER DISCHARGE INSTRUCTIONS:   Get Medicines reviewed and adjusted: Please take all your medications with you for your next visit with your Primary MD   Laboratory/radiological data: Please request your Primary MD to go over all hospital tests and procedure/radiological results at the follow up, please ask your Primary MD to get all Hospital records sent to his/her office.   In some cases, they will be blood work, cultures and biopsy results pending at the time of your discharge. Please request that your primary care M.D. goes through all the records of your hospital data and follows up on these results.   Also Note the following: If you experience worsening of your admission symptoms, develop shortness of breath, life threatening emergency, suicidal or homicidal thoughts you must seek medical attention immediately by calling 911 or calling your MD immediately  if symptoms less severe.   You must read complete instructions/literature along with all the possible adverse reactions/side effects for all the Medicines you take and that have been prescribed to you. Take any new Medicines after you have completely understood and accpet all the possible adverse reactions/side effects.    Do not drive when taking Pain medications or sleeping medications (Benzodaizepines)   Do not take more than prescribed Pain, Sleep and Anxiety Medications. It is not advisable to combine anxiety,sleep and pain medications without talking with your primary care practitioner   Special Instructions: If you have smoked or chewed Tobacco  in the last 2 yrs please stop smoking, stop any regular Alcohol  and or any Recreational drug use.   Wear Seat belts while driving.   Please note: You were cared for by a hospitalist during your hospital stay. Once you are discharged, your primary care physician will handle any further  medical issues. Please note that NO REFILLS for any discharge medications will be authorized once  you are discharged, as it is imperative that you return to your primary care physician (or establish a relationship with a primary care physician if you do not have one) for your post hospital discharge needs so that they can reassess your need for medications and monitor your lab values.  Time coordinating discharge: 40 minutes  SIGNED:  Pamella Pert, MD, PhD 05/08/2021, 7:55 AM

## 2021-05-08 NOTE — TOC Transition Note (Signed)
Transition of Care [facility]) - CM/SW Discharge Note   Patient Details  Name: Linda Lowe MRN: [id number] Date of Birth: Feb 19, 1951  Transition of [facility]) CM/SW Contact:  Kermit Balo, RN Phone Number: 05/08/2021, 10:15 AM   Clinical Narrative:    Patient is discharging home with self care. No needs per TOC.   Final next level of care: Home/Self Care Barriers to Discharge: No Barriers Identified   Patient Goals and CMS Choice        Discharge Placement                       Discharge Plan and Services                                     Social Determinants of Health (SDOH) Interventions     Readmission Risk Interventions No flowsheet data found.

## 2021-05-08 NOTE — Plan of Care (Signed)
  Problem: Education: Goal: Knowledge of General Education information will improve Description: Including pain rating scale, medication(s)/side effects and non-pharmacologic comfort measures 05/08/2021 1510 by Mamie Nick I, RN Outcome: Adequate for Discharge 05/08/2021 1510 by Mamie Nick I, RN Outcome: Adequate for Discharge   Problem: Health Behavior/Discharge Planning: Goal: Ability to manage health-related needs will improve 05/08/2021 1510 by Mamie Nick I, RN Outcome: Adequate for Discharge 05/08/2021 1510 by Mamie Nick I, RN Outcome: Adequate for Discharge   Problem: Clinical Measurements: Goal: Ability to maintain clinical measurements within normal limits will improve 05/08/2021 1510 by Mamie Nick I, RN Outcome: Adequate for Discharge 05/08/2021 1510 by Mamie Nick I, RN Outcome: Adequate for Discharge Goal: Will remain free from infection 05/08/2021 1510 by Mamie Nick I, RN Outcome: Adequate for Discharge 05/08/2021 1510 by Mamie Nick I, RN Outcome: Adequate for Discharge Goal: Diagnostic test results will improve 05/08/2021 1510 by Mamie Nick I, RN Outcome: Adequate for Discharge 05/08/2021 1510 by Mamie Nick I, RN Outcome: Adequate for Discharge Goal: Respiratory complications will improve 05/08/2021 1510 by Mamie Nick I, RN Outcome: Adequate for Discharge 05/08/2021 1510 by Mamie Nick I, RN Outcome: Adequate for Discharge Goal: Cardiovascular complication will be avoided 05/08/2021 1510 by Mamie Nick I, RN Outcome: Adequate for Discharge 05/08/2021 1510 by Mamie Nick I, RN Outcome: Adequate for Discharge   Problem: Activity: Goal: Risk for activity intolerance will decrease 05/08/2021 1510 by Mamie Nick I, RN Outcome: Adequate for Discharge 05/08/2021 1510 by Mamie Nick I, RN Outcome: Adequate for Discharge   Problem: Nutrition: Goal: Adequate nutrition will be  maintained 05/08/2021 1510 by Mamie Nick I, RN Outcome: Adequate for Discharge 05/08/2021 1510 by Mamie Nick I, RN Outcome: Adequate for Discharge   Problem: Coping: Goal: Level of anxiety will decrease 05/08/2021 1510 by Mamie Nick I, RN Outcome: Adequate for Discharge 05/08/2021 1510 by Mamie Nick I, RN Outcome: Adequate for Discharge   Problem: Elimination: Goal: Will not experience complications related to bowel motility 05/08/2021 1510 by Mamie Nick I, RN Outcome: Adequate for Discharge 05/08/2021 1510 by Mamie Nick I, RN Outcome: Adequate for Discharge Goal: Will not experience complications related to urinary retention 05/08/2021 1510 by Mamie Nick I, RN Outcome: Adequate for Discharge 05/08/2021 1510 by Mamie Nick I, RN Outcome: Adequate for Discharge   Problem: Pain Managment: Goal: General experience of comfort will improve 05/08/2021 1510 by Mamie Nick I, RN Outcome: Adequate for Discharge 05/08/2021 1510 by Mamie Nick I, RN Outcome: Adequate for Discharge   Problem: Safety: Goal: Ability to remain free from injury will improve 05/08/2021 1510 by Mamie Nick I, RN Outcome: Adequate for Discharge 05/08/2021 1510 by Mamie Nick I, RN Outcome: Adequate for Discharge   Problem: Skin Integrity: Goal: Risk for impaired skin integrity will decrease 05/08/2021 1510 by Mamie Nick I, RN Outcome: Adequate for Discharge 05/08/2021 1510 by Mamie Nick I, RN Outcome: Adequate for Discharge

## 2021-05-10 DIAGNOSIS — E118 Type 2 diabetes mellitus with unspecified complications: Secondary | ICD-10-CM | POA: Diagnosis not present

## 2021-05-10 DIAGNOSIS — I10 Essential (primary) hypertension: Secondary | ICD-10-CM | POA: Diagnosis not present

## 2021-05-10 DIAGNOSIS — I6930 Unspecified sequelae of cerebral infarction: Secondary | ICD-10-CM | POA: Diagnosis not present

## 2021-05-11 ENCOUNTER — Ambulatory Visit: Payer: TRICARE For Life (TFL) | Admitting: Cardiology

## 2021-05-11 ENCOUNTER — Other Ambulatory Visit: Payer: TRICARE For Life (TFL)

## 2021-05-14 ENCOUNTER — Other Ambulatory Visit: Payer: Self-pay | Admitting: Cardiology

## 2021-05-14 DIAGNOSIS — I10 Essential (primary) hypertension: Secondary | ICD-10-CM

## 2021-05-14 LAB — H PYLORI, IGM, IGG, IGA AB
H Pylori IgG: 0.27 Index Value (ref 0.00–0.79)
H. Pylogi, Iga Abs: <9 units (ref 0.0–8.9)
H. Pylogi, Igm Abs: <9 units (ref 0.0–8.9)

## 2021-05-16 ENCOUNTER — Other Ambulatory Visit: Payer: Self-pay

## 2021-05-16 ENCOUNTER — Encounter (HOSPITAL_COMMUNITY): Payer: Self-pay

## 2021-05-16 ENCOUNTER — Other Ambulatory Visit: Payer: Self-pay | Admitting: Cardiology

## 2021-05-16 ENCOUNTER — Emergency Department (HOSPITAL_COMMUNITY): Payer: Medicare Other

## 2021-05-16 ENCOUNTER — Other Ambulatory Visit: Payer: TRICARE For Life (TFL)

## 2021-05-16 ENCOUNTER — Emergency Department (HOSPITAL_COMMUNITY)
Admission: EM | Admit: 2021-05-16 | Discharge: 2021-05-16 | Disposition: A | Discharge status: Home / Self Care | Payer: Medicare Other | Attending: Emergency Medicine | Admitting: Emergency Medicine

## 2021-05-16 DIAGNOSIS — S0083XA Contusion of other part of head, initial encounter: Secondary | ICD-10-CM

## 2021-05-16 DIAGNOSIS — S8001XA Contusion of right knee, initial encounter: Secondary | ICD-10-CM

## 2021-05-16 DIAGNOSIS — M7989 Other specified soft tissue disorders: Secondary | ICD-10-CM

## 2021-05-16 DIAGNOSIS — W1812XA Fall from or off toilet with subsequent striking against object, initial encounter: Secondary | ICD-10-CM | POA: Diagnosis not present

## 2021-05-16 DIAGNOSIS — M79601 Pain in right arm: Secondary | ICD-10-CM | POA: Insufficient documentation

## 2021-05-16 DIAGNOSIS — Z95810 Presence of automatic (implantable) cardiac defibrillator: Secondary | ICD-10-CM | POA: Diagnosis not present

## 2021-05-16 DIAGNOSIS — I639 Cerebral infarction, unspecified: Secondary | ICD-10-CM | POA: Diagnosis not present

## 2021-05-16 DIAGNOSIS — E7800 Pure hypercholesterolemia, unspecified: Secondary | ICD-10-CM

## 2021-05-16 DIAGNOSIS — R0781 Pleurodynia: Secondary | ICD-10-CM | POA: Diagnosis not present

## 2021-05-16 DIAGNOSIS — R9431 Abnormal electrocardiogram [ECG] [EKG]: Secondary | ICD-10-CM | POA: Diagnosis not present

## 2021-05-16 DIAGNOSIS — Y92009 Unspecified place in unspecified non-institutional (private) residence as the place of occurrence of the external cause: Secondary | ICD-10-CM | POA: Diagnosis not present

## 2021-05-16 DIAGNOSIS — S4991XA Unspecified injury of right shoulder and upper arm, initial encounter: Secondary | ICD-10-CM | POA: Diagnosis not present

## 2021-05-16 DIAGNOSIS — R531 Weakness: Secondary | ICD-10-CM | POA: Diagnosis not present

## 2021-05-16 DIAGNOSIS — M25551 Pain in right hip: Secondary | ICD-10-CM | POA: Diagnosis not present

## 2021-05-16 DIAGNOSIS — Z9889 Other specified postprocedural states: Secondary | ICD-10-CM | POA: Diagnosis not present

## 2021-05-16 DIAGNOSIS — S0990XA Unspecified injury of head, initial encounter: Secondary | ICD-10-CM | POA: Diagnosis not present

## 2021-05-16 DIAGNOSIS — M25561 Pain in right knee: Secondary | ICD-10-CM | POA: Diagnosis not present

## 2021-05-16 DIAGNOSIS — I10 Essential (primary) hypertension: Secondary | ICD-10-CM

## 2021-05-16 HISTORY — DX: Unspecified atrial fibrillation: I48.91

## 2021-05-16 HISTORY — DX: Cerebral infarction, unspecified: I63.9

## 2021-05-16 LAB — COMPREHENSIVE METABOLIC PANEL
ALT: 19 U/L (ref 0–44)
AST: 19 U/L (ref 15–41)
Albumin: 3.1 g/dL — ABNORMAL LOW (ref 3.5–5.0)
Alkaline Phosphatase: 95 U/L (ref 38–126)
Anion gap: 7 (ref 5–15)
BUN: 20 mg/dL (ref 8–23)
CO2: 26 mmol/L (ref 22–32)
Calcium: 9.1 mg/dL (ref 8.9–10.3)
Chloride: 105 mmol/L (ref 98–111)
Creatinine, Ser: 0.62 mg/dL (ref 0.44–1.00)
GFR, Estimated: >60 mL/min (ref >60)
Glucose, Bld: 112 mg/dL — ABNORMAL HIGH (ref 70–99)
Potassium: 4.3 mmol/L (ref 3.5–5.1)
Sodium: 138 mmol/L (ref 135–145)
Total Bilirubin: 0.6 mg/dL (ref 0.3–1.2)
Total Protein: 6.9 g/dL (ref 6.5–8.1)

## 2021-05-16 LAB — CBC WITH DIFFERENTIAL/PLATELET
Abs Immature Granulocytes: 0.05 10*3/uL (ref 0.00–0.07)
Basophils Absolute: 0.1 10*3/uL (ref 0.0–0.1)
Basophils Relative: 0 %
Eosinophils Absolute: 0.2 10*3/uL (ref 0.0–0.5)
Eosinophils Relative: 2 %
HCT: 34.1 % — ABNORMAL LOW (ref 36.0–46.0)
Hemoglobin: 10.7 g/dL — ABNORMAL LOW (ref 12.0–15.0)
Immature Granulocytes: 0 %
Lymphocytes Relative: 13 %
Lymphs Abs: 1.5 10*3/uL (ref 0.7–4.0)
MCH: 27.6 pg (ref 26.0–34.0)
MCHC: 31.4 g/dL (ref 30.0–36.0)
MCV: 87.9 fL (ref 80.0–100.0)
Monocytes Absolute: 0.8 10*3/uL (ref 0.1–1.0)
Monocytes Relative: 7 %
Neutro Abs: 8.8 10*3/uL — ABNORMAL HIGH (ref 1.7–7.7)
Neutrophils Relative %: 78 %
Platelets: 415 10*3/uL — ABNORMAL HIGH (ref 150–400)
RBC: 3.88 MIL/uL (ref 3.87–5.11)
RDW: 16.7 % — ABNORMAL HIGH (ref 11.5–15.5)
WBC: 11.5 10*3/uL — ABNORMAL HIGH (ref 4.0–10.5)
nRBC: 0 % (ref 0.0–0.2)

## 2021-05-16 MED ORDER — ATORVASTATIN CALCIUM 80 MG PO TABS: 80.0000 mg | ORAL_TABLET | Freq: Every day | ORAL | 0 refills | Status: DC

## 2021-05-16 MED ORDER — FUROSEMIDE 20 MG PO TABS: 20.0000 mg | ORAL_TABLET | Freq: Every day | ORAL | 0 refills | Status: DC

## 2021-05-16 MED ORDER — IRBESARTAN 300 MG PO TABS: 300.0000 mg | ORAL_TABLET | Freq: Every day | ORAL | 0 refills | Status: DC

## 2021-05-16 MED ORDER — HYDROCODONE-ACETAMINOPHEN 5-325 MG PO TABS
2.0000 | ORAL_TABLET | Freq: Once | ORAL | Status: AC
  Administered 2021-05-16: 2 via ORAL
  Filled 2021-05-16: qty 2

## 2021-05-16 MED ORDER — FUROSEMIDE 20 MG PO TABS: 20.0000 mg | ORAL_TABLET | Freq: Two times a day (BID) | ORAL | 1 refills | Status: DC

## 2021-05-16 NOTE — Discharge Instructions (Addendum)
Your testing shows no signs of broken bones, you likely have some bruises for which you can take Tylenol or ibuprofen, please take Lasix once a day for 7 days to help with some of the swelling and call your cardiologist to rearrange a follow-up since you are not able to make your appointment today.  Return to the emergency department for severe or worsening symptoms

## 2021-05-16 NOTE — ED Notes (Signed)
Helped patient with the bedpan patient is now off the bedpan patient is resting with call bell in reach

## 2021-05-16 NOTE — ED Notes (Signed)
Patient transported to CT 

## 2021-05-16 NOTE — ED Notes (Signed)
Dr Hyacinth Meeker at bedside and told to downgrade and this is not a level 2 trauma.

## 2021-05-16 NOTE — ED Provider Notes (Signed)
[facility] EMERGENCY DEPARTMENT Provider Note   CSN: [id number] Arrival date & time: 05/16/21  0753     History Chief Complaint  Patient presents with   Linda Lowe is a 70 y.o. female.   Fall   This patient is a 70 year old female, she has a known history of prior atrial fibrillation and a prior stroke and arrives as a level 2 trauma.  She evidently had a fall at home, she takes blood thinners but cannot tell me the name of the medication that she takes, her husband is not here to provide additional information at this time.  The patient reports that she was feeling fine yesterday, her husband usually helps her to get from the wheelchair to the toilet and to move around the residence however when he was holding her today she had an accidental fall, she fell onto her right knee her right arm right ribs and bump to the forehead on the toilet.  The paramedics found the patient on the ground, they were able to help her to get onto the stretcher, there is no obvious deformities but she does not want to straighten her right leg secondary to pain of her knee.  There was no loss of consciousness, this occurred just prior to arrival, symptoms are persistent, worse with trying to move the right leg.  There was no bleeding lacerations hematomas or contusions that were seen.  Past Medical History:  Diagnosis Date   Afib (HCC)    Stroke (HCC)     There are no problems to display for this patient.   PMH:  Stroke   OB History   No obstetric history on file.     No family history on file.     Home Medications Prior to Admission medications   Medication Sig Start Date End Date Taking? Authorizing Provider  furosemide (LASIX) 20 MG tablet Take 1 tablet (20 mg total) by mouth daily for 7 days. 05/16/21 05/23/21 Yes Eber Hong, MD    Allergies    Patient has no known allergies.  Review of Systems   Review of Systems  All other systems reviewed and are  negative.  Physical Exam Updated Vital Signs BP (!) 143/62   Pulse 62   Temp 98.3 F (36.8 C) (Oral)   Resp 17   Ht 1.651 m (5\' 5" )   Wt 63.5 kg   SpO2 97%   BMI 23.30 kg/m   Physical Exam Vitals and nursing note reviewed.  Constitutional:      General: She is not in acute distress.    Appearance: She is well-developed.  HENT:     Head: Normocephalic and atraumatic.     Comments: There is no signs of trauma to the head the face of the scalp    Mouth/Throat:     Pharynx: No oropharyngeal exudate.  Eyes:     General: No scleral icterus.       Right eye: No discharge.        Left eye: No discharge.     Conjunctiva/sclera: Conjunctivae normal.     Pupils: Pupils are equal, round, and reactive to light.  Neck:     Thyroid: No thyromegaly.     Vascular: No JVD.  Cardiovascular:     Rate and Rhythm: Normal rate and regular rhythm.     Heart sounds: Normal heart sounds. No murmur heard.   No friction rub. No gallop.  Pulmonary:  Effort: Pulmonary effort is normal. No respiratory distress.     Breath sounds: Normal breath sounds. No wheezing or rales.  Abdominal:     General: Bowel sounds are normal. There is no distension.     Palpations: Abdomen is soft. There is no mass.     Tenderness: There is no abdominal tenderness.  Musculoskeletal:        General: Swelling, tenderness and deformity present. Normal range of motion.     Cervical back: Normal range of motion and neck supple.     Right lower leg: No edema.     Left lower leg: No edema.     Comments: The patient has tenderness to the right ribs in the lateral aspect proximally, also has tenderness over the right humerus and holds her arm in somewhat flexion and rigidity, she has tenderness to the right knee and some shortening and external rotation of the right leg but does not have any pain in the right hip with range of motion.  Lymphadenopathy:     Cervical: No cervical adenopathy.  Skin:    General: Skin is  warm and dry.     Findings: No erythema or rash.  Neurological:     Mental Status: She is alert.     Coordination: Coordination normal.     Comments: The patient is able to move all 4 extremities but is limited on the right leg and the right arm secondary to some stiffness and some pain, cranial nerves III through XII appear normal  Psychiatric:        Behavior: Behavior normal.    ED Results / Procedures / Treatments   Labs (all labs ordered are listed, but only abnormal results are displayed) Labs Reviewed  CBC WITH DIFFERENTIAL/PLATELET - Abnormal; Notable for the following components:      Result Value   WBC 11.5 (*)    Hemoglobin 10.7 (*)    HCT 34.1 (*)    RDW 16.7 (*)    Platelets 415 (*)    Neutro Abs 8.8 (*)    All other components within normal limits  COMPREHENSIVE METABOLIC PANEL - Abnormal; Notable for the following components:   Glucose, Bld 112 (*)    Albumin 3.1 (*)    All other components within normal limits    EKG None  Radiology DG Ribs Unilateral W/Chest Right  Result Date: 05/16/2021 CLINICAL DATA:  Recent fall with right-sided pain, initial encounter EXAM: RIGHT RIBS AND CHEST - 3+ VIEW COMPARISON:  05/02/2021 FINDINGS: Cardiac shadow is stable. Defibrillator is again noted and stable. The lungs are clear. Skin fold is noted over the right chest. No acute rib fracture is noted. IMPRESSION: No evidence of acute rib fracture. Electronically Signed   By: Alcide Clever M.D.   On: 05/16/2021 09:25   CT HEAD WO CONTRAST ( )  Result Date: 05/16/2021 CLINICAL DATA:  Trauma, on blood thinners EXAM: CT HEAD WITHOUT CONTRAST TECHNIQUE: Contiguous axial images were obtained from the base of the skull through the vertex without intravenous contrast. COMPARISON:  CT head 09/16/2019, brain MRI 09/25/2005 FINDINGS: Brain: There is no evidence of acute intracranial hemorrhage, extra-axial fluid collection, or acute infarct. There are remote infarcts in the right  occipital lobe and left pons, unchanged. The ventricles are nonenlarged. There is no mass lesion. There is no midline shift. Vascular: No hyperdense vessel or unexpected calcification. Skull: There is calcification of the bilateral cavernous ICAs. Sinuses/Orbits: There is mild mucosal thickening in the right maxillary sinus.  The mastoid air cells are clear. The globes and orbits are unremarkable. Other: None. IMPRESSION: 1. No acute intracranial hemorrhage or calvarial fracture. 2. Unchanged remote infarcts in the right occipital lobe and left pons. Electronically Signed   By: Lesia Hausen M.D.   On: 05/16/2021 08:41   DG Knee Complete 4 Views Right  Result Date: 05/16/2021 CLINICAL DATA:  Recent fall with knee pain, initial encounter EXAM: RIGHT KNEE - COMPLETE 4+ VIEW COMPARISON:  07/08/2017 FINDINGS: Postsurgical changes are noted in the patella with fixation screws identified. No acute fracture or dislocation is noted. No joint effusion is noted. No soft tissue changes are seen. IMPRESSION: Postsurgical change without acute abnormality. Electronically Signed   By: Alcide Clever M.D.   On: 05/16/2021 09:27   DG Humerus Right  Result Date: 05/16/2021 CLINICAL DATA:  states she hit her head on the toilet and is on a daily blood thinner. Prior stoke with right sided weakness. Pt is having rib pain at the level of her armpit Pt is having right arm pain and unable to move it at all, had to be he.*comment was truncated*trauma, fall EXAM: RIGHT HUMERUS - 2+ VIEW COMPARISON:  None. FINDINGS: There is no evidence of fracture or other focal bone lesions. Soft tissues are unremarkable. IMPRESSION: No fracture or dislocation. Electronically Signed   By: Genevive Bi M.D.   On: 05/16/2021 09:17   DG Hip Unilat W or Wo Pelvis 2-3 Views Right  Result Date: 05/16/2021 CLINICAL DATA:  Recent fall with right-sided hip pain, initial encounter EXAM: DG HIP (WITH OR WITHOUT PELVIS) 3V RIGHT COMPARISON:  None. FINDINGS:  Pelvic ring is intact. No acute fracture or dislocation is noted. No soft tissue abnormality is seen. IMPRESSION: No acute abnormality noted. Electronically Signed   By: Alcide Clever M.D.   On: 05/16/2021 09:27    Procedures Procedures   Medications Ordered in ED Medications  HYDROcodone-acetaminophen (NORCO/VICODIN) 5-325 MG per tablet 2 tablet (2 tablets Oral Given 05/16/21 1132)    ED Course  I have reviewed the triage vital signs and the nursing notes.  Pertinent labs & imaging results that were available during my care of the patient were reviewed by me and considered in my medical decision making (see chart for details).    MDM Rules/Calculators/A&P                           The patient has trauma involving right arm right leg ribs and possibly her head, she is anticoagulated but we are not sure on what medications, we will proceed with x-rays and EKG, patient agreeable to the plan  EKG performed on May 16, 2021 at 8:07 AM shows paced rhythm rate of 63 bpm, no other significant abnormalities, QRS prolonged consistent with paced rhythm.  X-rays show no signs of broken bones, I discussed the findings with the patient and her spouse, they are in agreement to follow-up in the outpatient setting.  Of note they did call her cardiologist today because of some lower extremity swelling, they were supposed to be seen at 1:00, the spouse has rescheduled the appointment, she is in no distress, has minimal edema, she will be given 1 week of furosemide, they were informed of the x-ray results and are in agreement for follow-up plan.  Final Clinical Impression(s) / ED Diagnoses Final diagnoses:  Contusion of right knee, initial encounter  Contusion of other part of head, initial encounter  Leg swelling  Rx / DC Orders ED Discharge Orders          Ordered    furosemide (LASIX) 20 MG tablet  Daily        05/16/21 1208             Eber Hong, MD 05/16/21 1210

## 2021-05-16 NOTE — ED Triage Notes (Signed)
BIB EMS from home after a fall at home. She states she hit her head on the toilet and is on a daily blood thinner. Prior stoke with right sided weakness.

## 2021-05-16 NOTE — Progress Notes (Signed)
   05/16/21 0741  Clinical Encounter Type  Visited With Patient not available  Visit Type Initial  Referral From Nurse  Consult/Referral To Chaplain   Chaplain responded to Level 2 trauma, later downgraded to non-trauma. Pt being treated and no support person present. No current need for chaplain. Chaplain remains available.   This note was prepared by Chaplain Resident, Tacy Learn, MDiv. Chaplain remains available as needed through the on-call pager: [phone].

## 2021-05-16 NOTE — ED Notes (Signed)
Family updated as to patient's status.

## 2021-05-16 NOTE — Progress Notes (Signed)
Orthopedic Tech Progress Note Patient Details:  ADLYNN LOWENSTEIN 04/05/1951 [id number]  Level 2 trauma that was downgraded to a none trauma   Patient ID: [name], female   DOB: 1951-02-25, 70 y.o.   MRN: [id number]  Donald Pore 05/16/2021, 8:30 AM

## 2021-05-17 ENCOUNTER — Encounter (HOSPITAL_COMMUNITY): Payer: Self-pay | Admitting: Gastroenterology

## 2021-05-17 DIAGNOSIS — K5730 Diverticulosis of large intestine without perforation or abscess without bleeding: Secondary | ICD-10-CM | POA: Diagnosis not present

## 2021-05-17 DIAGNOSIS — D509 Iron deficiency anemia, unspecified: Secondary | ICD-10-CM | POA: Diagnosis not present

## 2021-05-17 DIAGNOSIS — K259 Gastric ulcer, unspecified as acute or chronic, without hemorrhage or perforation: Secondary | ICD-10-CM | POA: Diagnosis not present

## 2021-05-17 DIAGNOSIS — K5900 Constipation, unspecified: Secondary | ICD-10-CM | POA: Diagnosis not present

## 2021-05-18 ENCOUNTER — Encounter: Payer: Self-pay | Admitting: Gastroenterology

## 2021-05-18 DIAGNOSIS — I429 Cardiomyopathy, unspecified: Secondary | ICD-10-CM | POA: Diagnosis not present

## 2021-05-18 DIAGNOSIS — Z794 Long term (current) use of insulin: Secondary | ICD-10-CM | POA: Diagnosis not present

## 2021-05-18 DIAGNOSIS — I110 Hypertensive heart disease with heart failure: Secondary | ICD-10-CM | POA: Diagnosis not present

## 2021-05-18 DIAGNOSIS — I428 Other cardiomyopathies: Secondary | ICD-10-CM | POA: Diagnosis not present

## 2021-05-18 DIAGNOSIS — J09X2 Influenza due to identified novel influenza A virus with other respiratory manifestations: Secondary | ICD-10-CM | POA: Diagnosis not present

## 2021-05-18 DIAGNOSIS — Z741 Need for assistance with personal care: Secondary | ICD-10-CM | POA: Diagnosis not present

## 2021-05-18 DIAGNOSIS — K260 Acute duodenal ulcer with hemorrhage: Secondary | ICD-10-CM | POA: Diagnosis not present

## 2021-05-18 DIAGNOSIS — E7800 Pure hypercholesterolemia, unspecified: Secondary | ICD-10-CM | POA: Diagnosis not present

## 2021-05-18 DIAGNOSIS — I5022 Chronic systolic (congestive) heart failure: Secondary | ICD-10-CM | POA: Diagnosis not present

## 2021-05-18 DIAGNOSIS — K250 Acute gastric ulcer with hemorrhage: Secondary | ICD-10-CM | POA: Diagnosis not present

## 2021-05-18 DIAGNOSIS — E1165 Type 2 diabetes mellitus with hyperglycemia: Secondary | ICD-10-CM | POA: Diagnosis not present

## 2021-05-18 DIAGNOSIS — Z9181 History of falling: Secondary | ICD-10-CM | POA: Diagnosis not present

## 2021-05-18 DIAGNOSIS — Z95810 Presence of automatic (implantable) cardiac defibrillator: Secondary | ICD-10-CM | POA: Diagnosis not present

## 2021-05-18 DIAGNOSIS — I69353 Hemiplegia and hemiparesis following cerebral infarction affecting right non-dominant side: Secondary | ICD-10-CM | POA: Diagnosis not present

## 2021-05-18 DIAGNOSIS — Z7902 Long term (current) use of antithrombotics/antiplatelets: Secondary | ICD-10-CM | POA: Diagnosis not present

## 2021-05-18 DIAGNOSIS — E119 Type 2 diabetes mellitus without complications: Secondary | ICD-10-CM | POA: Diagnosis not present

## 2021-05-18 DIAGNOSIS — D509 Iron deficiency anemia, unspecified: Secondary | ICD-10-CM | POA: Diagnosis not present

## 2021-05-19 LAB — CMP14+EGFR
ALT: 27 IU/L (ref 0–32)
AST: 26 IU/L (ref 0–40)
Albumin/Globulin Ratio: 1.1 — ABNORMAL LOW (ref 1.2–2.2)
Albumin: 4 g/dL (ref 3.8–4.8)
Alkaline Phosphatase: 135 IU/L — ABNORMAL HIGH (ref 44–121)
BUN/Creatinine Ratio: 46 — ABNORMAL HIGH (ref 12–28)
BUN: 37 mg/dL — ABNORMAL HIGH (ref 8–27)
Bilirubin Total: <0.2 mg/dL (ref 0.0–1.2)
CO2: 23 mmol/L (ref 20–29)
Calcium: 9.5 mg/dL (ref 8.7–10.3)
Chloride: 99 mmol/L (ref 96–106)
Creatinine, Ser: 0.8 mg/dL (ref 0.57–1.00)
Globulin, Total: 3.7 g/dL (ref 1.5–4.5)
Glucose: 168 mg/dL — ABNORMAL HIGH (ref 65–99)
Potassium: 4.7 mmol/L (ref 3.5–5.2)
Sodium: 138 mmol/L (ref 134–144)
Total Protein: 7.7 g/dL (ref 6.0–8.5)
eGFR: 80 mL/min/{1.73_m2} (ref >59)

## 2021-05-19 LAB — TSH: TSH: 1.93 u[IU]/mL (ref 0.450–4.500)

## 2021-05-19 LAB — CBC
Hematocrit: 38.9 % (ref 34.0–46.6)
Hemoglobin: 11.9 g/dL (ref 11.1–15.9)
MCH: 26.7 pg (ref 26.6–33.0)
MCHC: 30.6 g/dL — ABNORMAL LOW (ref 31.5–35.7)
MCV: 87 fL (ref 79–97)
Platelets: 464 10*3/uL — ABNORMAL HIGH (ref 150–450)
RBC: 4.45 x10E6/uL (ref 3.77–5.28)
RDW: 15.4 % (ref 11.7–15.4)
WBC: 8.3 10*3/uL (ref 3.4–10.8)

## 2021-05-19 LAB — LIPID PANEL WITH LDL/HDL RATIO
Cholesterol, Total: 150 mg/dL (ref 100–199)
HDL: 41 mg/dL (ref >39)
LDL Chol Calc (NIH): 91 mg/dL (ref 0–99)
LDL/HDL Ratio: 2.2 ratio (ref 0.0–3.2)
Triglycerides: 95 mg/dL (ref 0–149)
VLDL Cholesterol Cal: 18 mg/dL (ref 5–40)

## 2021-05-19 LAB — HGB A1C W/O EAG: Hgb A1c MFr Bld: 6.3 % — ABNORMAL HIGH (ref 4.8–5.6)

## 2021-05-19 LAB — BRAIN NATRIURETIC PEPTIDE: BNP: 26.3 pg/mL (ref 0.0–100.0)

## 2021-05-21 NOTE — Progress Notes (Signed)
BNP is normal, does not suggest heart failure.  Other labs are all within normal limits, thyroid function is normal, diabetes is improved and cholesterol is well controlled but may need additional therapy.  Will forward a copy to PCP.  Will discuss on office visit.

## 2021-05-22 DIAGNOSIS — Z4502 Encounter for adjustment and management of automatic implantable cardiac defibrillator: Secondary | ICD-10-CM | POA: Diagnosis not present

## 2021-05-22 DIAGNOSIS — I5022 Chronic systolic (congestive) heart failure: Secondary | ICD-10-CM | POA: Diagnosis not present

## 2021-05-22 DIAGNOSIS — Z95810 Presence of automatic (implantable) cardiac defibrillator: Secondary | ICD-10-CM | POA: Diagnosis not present

## 2021-05-23 DIAGNOSIS — D509 Iron deficiency anemia, unspecified: Secondary | ICD-10-CM | POA: Diagnosis not present

## 2021-05-23 DIAGNOSIS — K250 Acute gastric ulcer with hemorrhage: Secondary | ICD-10-CM | POA: Diagnosis not present

## 2021-05-23 DIAGNOSIS — I110 Hypertensive heart disease with heart failure: Secondary | ICD-10-CM | POA: Diagnosis not present

## 2021-05-23 DIAGNOSIS — J09X2 Influenza due to identified novel influenza A virus with other respiratory manifestations: Secondary | ICD-10-CM | POA: Diagnosis not present

## 2021-05-23 DIAGNOSIS — I69353 Hemiplegia and hemiparesis following cerebral infarction affecting right non-dominant side: Secondary | ICD-10-CM | POA: Diagnosis not present

## 2021-05-23 DIAGNOSIS — K260 Acute duodenal ulcer with hemorrhage: Secondary | ICD-10-CM | POA: Diagnosis not present

## 2021-05-24 ENCOUNTER — Other Ambulatory Visit: Payer: Self-pay

## 2021-05-24 ENCOUNTER — Other Ambulatory Visit: Payer: Self-pay | Admitting: Cardiology

## 2021-05-24 ENCOUNTER — Ambulatory Visit: Payer: Medicare Other

## 2021-05-25 ENCOUNTER — Ambulatory Visit: Payer: TRICARE For Life (TFL) | Admitting: Cardiology

## 2021-05-25 ENCOUNTER — Other Ambulatory Visit: Payer: Self-pay | Admitting: Gastroenterology

## 2021-05-25 DIAGNOSIS — J09X2 Influenza due to identified novel influenza A virus with other respiratory manifestations: Secondary | ICD-10-CM | POA: Diagnosis not present

## 2021-05-25 DIAGNOSIS — K250 Acute gastric ulcer with hemorrhage: Secondary | ICD-10-CM | POA: Diagnosis not present

## 2021-05-25 DIAGNOSIS — D509 Iron deficiency anemia, unspecified: Secondary | ICD-10-CM | POA: Diagnosis not present

## 2021-05-25 DIAGNOSIS — I69353 Hemiplegia and hemiparesis following cerebral infarction affecting right non-dominant side: Secondary | ICD-10-CM | POA: Diagnosis not present

## 2021-05-25 DIAGNOSIS — I110 Hypertensive heart disease with heart failure: Secondary | ICD-10-CM | POA: Diagnosis not present

## 2021-05-25 DIAGNOSIS — K260 Acute duodenal ulcer with hemorrhage: Secondary | ICD-10-CM | POA: Diagnosis not present

## 2021-05-28 DIAGNOSIS — E1169 Type 2 diabetes mellitus with other specified complication: Secondary | ICD-10-CM | POA: Diagnosis not present

## 2021-05-28 DIAGNOSIS — I509 Heart failure, unspecified: Secondary | ICD-10-CM | POA: Diagnosis not present

## 2021-05-28 DIAGNOSIS — I952 Hypotension due to drugs: Secondary | ICD-10-CM | POA: Diagnosis not present

## 2021-05-29 DIAGNOSIS — I69353 Hemiplegia and hemiparesis following cerebral infarction affecting right non-dominant side: Secondary | ICD-10-CM | POA: Diagnosis not present

## 2021-05-29 DIAGNOSIS — K250 Acute gastric ulcer with hemorrhage: Secondary | ICD-10-CM | POA: Diagnosis not present

## 2021-05-29 DIAGNOSIS — K260 Acute duodenal ulcer with hemorrhage: Secondary | ICD-10-CM | POA: Diagnosis not present

## 2021-05-29 DIAGNOSIS — J09X2 Influenza due to identified novel influenza A virus with other respiratory manifestations: Secondary | ICD-10-CM | POA: Diagnosis not present

## 2021-05-29 DIAGNOSIS — D509 Iron deficiency anemia, unspecified: Secondary | ICD-10-CM | POA: Diagnosis not present

## 2021-05-29 DIAGNOSIS — I110 Hypertensive heart disease with heart failure: Secondary | ICD-10-CM | POA: Diagnosis not present

## 2021-05-30 ENCOUNTER — Ambulatory Visit: Payer: TRICARE For Life (TFL) | Admitting: Cardiology

## 2021-05-30 DIAGNOSIS — D509 Iron deficiency anemia, unspecified: Secondary | ICD-10-CM | POA: Diagnosis not present

## 2021-05-30 DIAGNOSIS — K250 Acute gastric ulcer with hemorrhage: Secondary | ICD-10-CM | POA: Diagnosis not present

## 2021-05-30 DIAGNOSIS — J09X2 Influenza due to identified novel influenza A virus with other respiratory manifestations: Secondary | ICD-10-CM | POA: Diagnosis not present

## 2021-05-30 DIAGNOSIS — I110 Hypertensive heart disease with heart failure: Secondary | ICD-10-CM | POA: Diagnosis not present

## 2021-05-30 DIAGNOSIS — K260 Acute duodenal ulcer with hemorrhage: Secondary | ICD-10-CM | POA: Diagnosis not present

## 2021-05-30 DIAGNOSIS — I69353 Hemiplegia and hemiparesis following cerebral infarction affecting right non-dominant side: Secondary | ICD-10-CM | POA: Diagnosis not present

## 2021-05-31 ENCOUNTER — Other Ambulatory Visit: Payer: Self-pay | Admitting: Cardiology

## 2021-05-31 DIAGNOSIS — K260 Acute duodenal ulcer with hemorrhage: Secondary | ICD-10-CM | POA: Diagnosis not present

## 2021-05-31 DIAGNOSIS — D509 Iron deficiency anemia, unspecified: Secondary | ICD-10-CM | POA: Diagnosis not present

## 2021-05-31 DIAGNOSIS — I10 Essential (primary) hypertension: Secondary | ICD-10-CM

## 2021-05-31 DIAGNOSIS — I69353 Hemiplegia and hemiparesis following cerebral infarction affecting right non-dominant side: Secondary | ICD-10-CM | POA: Diagnosis not present

## 2021-05-31 DIAGNOSIS — I110 Hypertensive heart disease with heart failure: Secondary | ICD-10-CM | POA: Diagnosis not present

## 2021-05-31 DIAGNOSIS — J09X2 Influenza due to identified novel influenza A virus with other respiratory manifestations: Secondary | ICD-10-CM | POA: Diagnosis not present

## 2021-05-31 DIAGNOSIS — K250 Acute gastric ulcer with hemorrhage: Secondary | ICD-10-CM | POA: Diagnosis not present

## 2021-06-01 DIAGNOSIS — J09X2 Influenza due to identified novel influenza A virus with other respiratory manifestations: Secondary | ICD-10-CM | POA: Diagnosis not present

## 2021-06-01 DIAGNOSIS — I110 Hypertensive heart disease with heart failure: Secondary | ICD-10-CM | POA: Diagnosis not present

## 2021-06-01 DIAGNOSIS — D509 Iron deficiency anemia, unspecified: Secondary | ICD-10-CM | POA: Diagnosis not present

## 2021-06-01 DIAGNOSIS — I69353 Hemiplegia and hemiparesis following cerebral infarction affecting right non-dominant side: Secondary | ICD-10-CM | POA: Diagnosis not present

## 2021-06-01 DIAGNOSIS — K260 Acute duodenal ulcer with hemorrhage: Secondary | ICD-10-CM | POA: Diagnosis not present

## 2021-06-01 DIAGNOSIS — K250 Acute gastric ulcer with hemorrhage: Secondary | ICD-10-CM | POA: Diagnosis not present

## 2021-06-04 DIAGNOSIS — K260 Acute duodenal ulcer with hemorrhage: Secondary | ICD-10-CM | POA: Diagnosis not present

## 2021-06-04 DIAGNOSIS — D509 Iron deficiency anemia, unspecified: Secondary | ICD-10-CM | POA: Diagnosis not present

## 2021-06-04 DIAGNOSIS — I110 Hypertensive heart disease with heart failure: Secondary | ICD-10-CM | POA: Diagnosis not present

## 2021-06-04 DIAGNOSIS — J09X2 Influenza due to identified novel influenza A virus with other respiratory manifestations: Secondary | ICD-10-CM | POA: Diagnosis not present

## 2021-06-04 DIAGNOSIS — K250 Acute gastric ulcer with hemorrhage: Secondary | ICD-10-CM | POA: Diagnosis not present

## 2021-06-04 DIAGNOSIS — I69353 Hemiplegia and hemiparesis following cerebral infarction affecting right non-dominant side: Secondary | ICD-10-CM | POA: Diagnosis not present

## 2021-06-06 DIAGNOSIS — I110 Hypertensive heart disease with heart failure: Secondary | ICD-10-CM | POA: Diagnosis not present

## 2021-06-06 DIAGNOSIS — J09X2 Influenza due to identified novel influenza A virus with other respiratory manifestations: Secondary | ICD-10-CM | POA: Diagnosis not present

## 2021-06-06 DIAGNOSIS — K260 Acute duodenal ulcer with hemorrhage: Secondary | ICD-10-CM | POA: Diagnosis not present

## 2021-06-06 DIAGNOSIS — K250 Acute gastric ulcer with hemorrhage: Secondary | ICD-10-CM | POA: Diagnosis not present

## 2021-06-06 DIAGNOSIS — D509 Iron deficiency anemia, unspecified: Secondary | ICD-10-CM | POA: Diagnosis not present

## 2021-06-06 DIAGNOSIS — I69353 Hemiplegia and hemiparesis following cerebral infarction affecting right non-dominant side: Secondary | ICD-10-CM | POA: Diagnosis not present

## 2021-06-06 NOTE — Progress Notes (Signed)
Pre- call completed for endo procedure on 9/30 with patient's husband due to a nurse visiting with pt at the time of call. He informed pt BP being on lower side but is meeting with home health nurse currently to discuss- I told him if still concerned to call primary MD to discuss possible med changes.

## 2021-06-08 DIAGNOSIS — K250 Acute gastric ulcer with hemorrhage: Secondary | ICD-10-CM | POA: Diagnosis not present

## 2021-06-08 DIAGNOSIS — I110 Hypertensive heart disease with heart failure: Secondary | ICD-10-CM | POA: Diagnosis not present

## 2021-06-08 DIAGNOSIS — I69353 Hemiplegia and hemiparesis following cerebral infarction affecting right non-dominant side: Secondary | ICD-10-CM | POA: Diagnosis not present

## 2021-06-08 DIAGNOSIS — J09X2 Influenza due to identified novel influenza A virus with other respiratory manifestations: Secondary | ICD-10-CM | POA: Diagnosis not present

## 2021-06-08 DIAGNOSIS — D509 Iron deficiency anemia, unspecified: Secondary | ICD-10-CM | POA: Diagnosis not present

## 2021-06-08 DIAGNOSIS — K260 Acute duodenal ulcer with hemorrhage: Secondary | ICD-10-CM | POA: Diagnosis not present

## 2021-06-11 DIAGNOSIS — J09X2 Influenza due to identified novel influenza A virus with other respiratory manifestations: Secondary | ICD-10-CM | POA: Diagnosis not present

## 2021-06-11 DIAGNOSIS — I69353 Hemiplegia and hemiparesis following cerebral infarction affecting right non-dominant side: Secondary | ICD-10-CM | POA: Diagnosis not present

## 2021-06-11 DIAGNOSIS — K260 Acute duodenal ulcer with hemorrhage: Secondary | ICD-10-CM | POA: Diagnosis not present

## 2021-06-11 DIAGNOSIS — D509 Iron deficiency anemia, unspecified: Secondary | ICD-10-CM | POA: Diagnosis not present

## 2021-06-11 DIAGNOSIS — K250 Acute gastric ulcer with hemorrhage: Secondary | ICD-10-CM | POA: Diagnosis not present

## 2021-06-11 DIAGNOSIS — I110 Hypertensive heart disease with heart failure: Secondary | ICD-10-CM | POA: Diagnosis not present

## 2021-06-13 DIAGNOSIS — I69353 Hemiplegia and hemiparesis following cerebral infarction affecting right non-dominant side: Secondary | ICD-10-CM | POA: Diagnosis not present

## 2021-06-13 DIAGNOSIS — K250 Acute gastric ulcer with hemorrhage: Secondary | ICD-10-CM | POA: Diagnosis not present

## 2021-06-13 DIAGNOSIS — D509 Iron deficiency anemia, unspecified: Secondary | ICD-10-CM | POA: Diagnosis not present

## 2021-06-13 DIAGNOSIS — J09X2 Influenza due to identified novel influenza A virus with other respiratory manifestations: Secondary | ICD-10-CM | POA: Diagnosis not present

## 2021-06-13 DIAGNOSIS — I110 Hypertensive heart disease with heart failure: Secondary | ICD-10-CM | POA: Diagnosis not present

## 2021-06-13 DIAGNOSIS — K260 Acute duodenal ulcer with hemorrhage: Secondary | ICD-10-CM | POA: Diagnosis not present

## 2021-06-15 ENCOUNTER — Encounter (HOSPITAL_COMMUNITY): Admission: RE | Payer: Self-pay | Source: Home / Self Care

## 2021-06-15 ENCOUNTER — Ambulatory Visit (HOSPITAL_COMMUNITY): Admission: RE | Admit: 2021-06-15 | Payer: Medicare Other | Source: Home / Self Care | Admitting: Gastroenterology

## 2021-06-15 SURGERY — ESOPHAGOGASTRODUODENOSCOPY (EGD) WITH PROPOFOL
Anesthesia: Monitor Anesthesia Care

## 2021-06-17 DIAGNOSIS — Z9181 History of falling: Secondary | ICD-10-CM | POA: Diagnosis not present

## 2021-06-17 DIAGNOSIS — I110 Hypertensive heart disease with heart failure: Secondary | ICD-10-CM | POA: Diagnosis not present

## 2021-06-17 DIAGNOSIS — Z95810 Presence of automatic (implantable) cardiac defibrillator: Secondary | ICD-10-CM | POA: Diagnosis not present

## 2021-06-17 DIAGNOSIS — Z741 Need for assistance with personal care: Secondary | ICD-10-CM | POA: Diagnosis not present

## 2021-06-17 DIAGNOSIS — I5022 Chronic systolic (congestive) heart failure: Secondary | ICD-10-CM | POA: Diagnosis not present

## 2021-06-17 DIAGNOSIS — J09X2 Influenza due to identified novel influenza A virus with other respiratory manifestations: Secondary | ICD-10-CM | POA: Diagnosis not present

## 2021-06-17 DIAGNOSIS — Z7902 Long term (current) use of antithrombotics/antiplatelets: Secondary | ICD-10-CM | POA: Diagnosis not present

## 2021-06-17 DIAGNOSIS — E1165 Type 2 diabetes mellitus with hyperglycemia: Secondary | ICD-10-CM | POA: Diagnosis not present

## 2021-06-17 DIAGNOSIS — I429 Cardiomyopathy, unspecified: Secondary | ICD-10-CM | POA: Diagnosis not present

## 2021-06-17 DIAGNOSIS — Z794 Long term (current) use of insulin: Secondary | ICD-10-CM | POA: Diagnosis not present

## 2021-06-17 DIAGNOSIS — D509 Iron deficiency anemia, unspecified: Secondary | ICD-10-CM | POA: Diagnosis not present

## 2021-06-17 DIAGNOSIS — K250 Acute gastric ulcer with hemorrhage: Secondary | ICD-10-CM | POA: Diagnosis not present

## 2021-06-17 DIAGNOSIS — K260 Acute duodenal ulcer with hemorrhage: Secondary | ICD-10-CM | POA: Diagnosis not present

## 2021-06-17 DIAGNOSIS — I69353 Hemiplegia and hemiparesis following cerebral infarction affecting right non-dominant side: Secondary | ICD-10-CM | POA: Diagnosis not present

## 2021-06-19 ENCOUNTER — Other Ambulatory Visit: Payer: Self-pay

## 2021-06-19 ENCOUNTER — Ambulatory Visit (HOSPITAL_COMMUNITY)
Admission: RE | Admit: 2021-06-19 | Discharge: 2021-06-19 | Disposition: A | Discharge status: Home / Self Care | Payer: Medicare Other | Source: Ambulatory Visit | Attending: Cardiology | Admitting: Cardiology

## 2021-06-19 DIAGNOSIS — Z95810 Presence of automatic (implantable) cardiac defibrillator: Secondary | ICD-10-CM | POA: Diagnosis not present

## 2021-06-19 DIAGNOSIS — I428 Other cardiomyopathies: Secondary | ICD-10-CM | POA: Diagnosis not present

## 2021-06-19 DIAGNOSIS — I447 Left bundle-branch block, unspecified: Secondary | ICD-10-CM | POA: Insufficient documentation

## 2021-06-19 DIAGNOSIS — I351 Nonrheumatic aortic (valve) insufficiency: Secondary | ICD-10-CM | POA: Insufficient documentation

## 2021-06-19 DIAGNOSIS — I10 Essential (primary) hypertension: Secondary | ICD-10-CM | POA: Insufficient documentation

## 2021-06-19 NOTE — Progress Notes (Signed)
  Echocardiogram 2D Echocardiogram has been performed.  Linda Lowe 06/19/2021, 2:25 PM

## 2021-06-20 DIAGNOSIS — I110 Hypertensive heart disease with heart failure: Secondary | ICD-10-CM | POA: Diagnosis not present

## 2021-06-20 DIAGNOSIS — I69353 Hemiplegia and hemiparesis following cerebral infarction affecting right non-dominant side: Secondary | ICD-10-CM | POA: Diagnosis not present

## 2021-06-20 DIAGNOSIS — D509 Iron deficiency anemia, unspecified: Secondary | ICD-10-CM | POA: Diagnosis not present

## 2021-06-20 DIAGNOSIS — J09X2 Influenza due to identified novel influenza A virus with other respiratory manifestations: Secondary | ICD-10-CM | POA: Diagnosis not present

## 2021-06-20 DIAGNOSIS — K250 Acute gastric ulcer with hemorrhage: Secondary | ICD-10-CM | POA: Diagnosis not present

## 2021-06-20 DIAGNOSIS — K260 Acute duodenal ulcer with hemorrhage: Secondary | ICD-10-CM | POA: Diagnosis not present

## 2021-06-22 DIAGNOSIS — E039 Hypothyroidism, unspecified: Secondary | ICD-10-CM | POA: Diagnosis not present

## 2021-06-22 DIAGNOSIS — I69353 Hemiplegia and hemiparesis following cerebral infarction affecting right non-dominant side: Secondary | ICD-10-CM | POA: Diagnosis not present

## 2021-06-22 DIAGNOSIS — K250 Acute gastric ulcer with hemorrhage: Secondary | ICD-10-CM | POA: Diagnosis not present

## 2021-06-22 DIAGNOSIS — D509 Iron deficiency anemia, unspecified: Secondary | ICD-10-CM | POA: Diagnosis not present

## 2021-06-22 DIAGNOSIS — J09X2 Influenza due to identified novel influenza A virus with other respiratory manifestations: Secondary | ICD-10-CM | POA: Diagnosis not present

## 2021-06-22 DIAGNOSIS — I6930 Unspecified sequelae of cerebral infarction: Secondary | ICD-10-CM | POA: Diagnosis not present

## 2021-06-22 DIAGNOSIS — E1169 Type 2 diabetes mellitus with other specified complication: Secondary | ICD-10-CM | POA: Diagnosis not present

## 2021-06-22 DIAGNOSIS — I110 Hypertensive heart disease with heart failure: Secondary | ICD-10-CM | POA: Diagnosis not present

## 2021-06-22 DIAGNOSIS — I119 Hypertensive heart disease without heart failure: Secondary | ICD-10-CM | POA: Diagnosis not present

## 2021-06-22 DIAGNOSIS — K260 Acute duodenal ulcer with hemorrhage: Secondary | ICD-10-CM | POA: Diagnosis not present

## 2021-06-23 DIAGNOSIS — K250 Acute gastric ulcer with hemorrhage: Secondary | ICD-10-CM | POA: Diagnosis not present

## 2021-06-23 DIAGNOSIS — I69353 Hemiplegia and hemiparesis following cerebral infarction affecting right non-dominant side: Secondary | ICD-10-CM | POA: Diagnosis not present

## 2021-06-23 DIAGNOSIS — K260 Acute duodenal ulcer with hemorrhage: Secondary | ICD-10-CM | POA: Diagnosis not present

## 2021-06-23 DIAGNOSIS — I110 Hypertensive heart disease with heart failure: Secondary | ICD-10-CM | POA: Diagnosis not present

## 2021-06-23 DIAGNOSIS — D509 Iron deficiency anemia, unspecified: Secondary | ICD-10-CM | POA: Diagnosis not present

## 2021-06-23 DIAGNOSIS — J09X2 Influenza due to identified novel influenza A virus with other respiratory manifestations: Secondary | ICD-10-CM | POA: Diagnosis not present

## 2021-06-25 DIAGNOSIS — I69353 Hemiplegia and hemiparesis following cerebral infarction affecting right non-dominant side: Secondary | ICD-10-CM | POA: Diagnosis not present

## 2021-06-25 DIAGNOSIS — J09X2 Influenza due to identified novel influenza A virus with other respiratory manifestations: Secondary | ICD-10-CM | POA: Diagnosis not present

## 2021-06-25 DIAGNOSIS — I110 Hypertensive heart disease with heart failure: Secondary | ICD-10-CM | POA: Diagnosis not present

## 2021-06-25 DIAGNOSIS — K250 Acute gastric ulcer with hemorrhage: Secondary | ICD-10-CM | POA: Diagnosis not present

## 2021-06-25 DIAGNOSIS — D509 Iron deficiency anemia, unspecified: Secondary | ICD-10-CM | POA: Diagnosis not present

## 2021-06-25 DIAGNOSIS — K260 Acute duodenal ulcer with hemorrhage: Secondary | ICD-10-CM | POA: Diagnosis not present

## 2021-06-26 DIAGNOSIS — J09X2 Influenza due to identified novel influenza A virus with other respiratory manifestations: Secondary | ICD-10-CM | POA: Diagnosis not present

## 2021-06-26 DIAGNOSIS — D509 Iron deficiency anemia, unspecified: Secondary | ICD-10-CM | POA: Diagnosis not present

## 2021-06-26 DIAGNOSIS — K250 Acute gastric ulcer with hemorrhage: Secondary | ICD-10-CM | POA: Diagnosis not present

## 2021-06-26 DIAGNOSIS — I110 Hypertensive heart disease with heart failure: Secondary | ICD-10-CM | POA: Diagnosis not present

## 2021-06-26 DIAGNOSIS — I69353 Hemiplegia and hemiparesis following cerebral infarction affecting right non-dominant side: Secondary | ICD-10-CM | POA: Diagnosis not present

## 2021-06-26 DIAGNOSIS — K260 Acute duodenal ulcer with hemorrhage: Secondary | ICD-10-CM | POA: Diagnosis not present

## 2021-06-27 DIAGNOSIS — D509 Iron deficiency anemia, unspecified: Secondary | ICD-10-CM | POA: Diagnosis not present

## 2021-06-27 DIAGNOSIS — I110 Hypertensive heart disease with heart failure: Secondary | ICD-10-CM | POA: Diagnosis not present

## 2021-06-27 DIAGNOSIS — J09X2 Influenza due to identified novel influenza A virus with other respiratory manifestations: Secondary | ICD-10-CM | POA: Diagnosis not present

## 2021-06-27 DIAGNOSIS — K250 Acute gastric ulcer with hemorrhage: Secondary | ICD-10-CM | POA: Diagnosis not present

## 2021-06-27 DIAGNOSIS — I69353 Hemiplegia and hemiparesis following cerebral infarction affecting right non-dominant side: Secondary | ICD-10-CM | POA: Diagnosis not present

## 2021-06-27 DIAGNOSIS — K260 Acute duodenal ulcer with hemorrhage: Secondary | ICD-10-CM | POA: Diagnosis not present

## 2021-06-28 DIAGNOSIS — K260 Acute duodenal ulcer with hemorrhage: Secondary | ICD-10-CM | POA: Diagnosis not present

## 2021-06-28 DIAGNOSIS — I110 Hypertensive heart disease with heart failure: Secondary | ICD-10-CM | POA: Diagnosis not present

## 2021-06-28 DIAGNOSIS — D509 Iron deficiency anemia, unspecified: Secondary | ICD-10-CM | POA: Diagnosis not present

## 2021-06-28 DIAGNOSIS — K250 Acute gastric ulcer with hemorrhage: Secondary | ICD-10-CM | POA: Diagnosis not present

## 2021-06-28 DIAGNOSIS — I69353 Hemiplegia and hemiparesis following cerebral infarction affecting right non-dominant side: Secondary | ICD-10-CM | POA: Diagnosis not present

## 2021-06-28 DIAGNOSIS — J09X2 Influenza due to identified novel influenza A virus with other respiratory manifestations: Secondary | ICD-10-CM | POA: Diagnosis not present

## 2021-06-29 ENCOUNTER — Ambulatory Visit: Payer: TRICARE For Life (TFL) | Admitting: Student

## 2021-06-29 DIAGNOSIS — K250 Acute gastric ulcer with hemorrhage: Secondary | ICD-10-CM | POA: Diagnosis not present

## 2021-06-29 DIAGNOSIS — D509 Iron deficiency anemia, unspecified: Secondary | ICD-10-CM | POA: Diagnosis not present

## 2021-06-29 DIAGNOSIS — I69353 Hemiplegia and hemiparesis following cerebral infarction affecting right non-dominant side: Secondary | ICD-10-CM | POA: Diagnosis not present

## 2021-06-29 DIAGNOSIS — I110 Hypertensive heart disease with heart failure: Secondary | ICD-10-CM | POA: Diagnosis not present

## 2021-06-29 DIAGNOSIS — J09X2 Influenza due to identified novel influenza A virus with other respiratory manifestations: Secondary | ICD-10-CM | POA: Diagnosis not present

## 2021-06-29 DIAGNOSIS — K260 Acute duodenal ulcer with hemorrhage: Secondary | ICD-10-CM | POA: Diagnosis not present

## 2021-07-02 ENCOUNTER — Encounter: Payer: Self-pay | Admitting: Student

## 2021-07-02 ENCOUNTER — Ambulatory Visit: Payer: Medicare Other | Admitting: Student

## 2021-07-02 ENCOUNTER — Other Ambulatory Visit: Payer: Self-pay

## 2021-07-02 VITALS — BP 117/64 | HR 64 | Temp 98.0°F | Resp 16 | Ht 65.0 in

## 2021-07-02 DIAGNOSIS — Z95810 Presence of automatic (implantable) cardiac defibrillator: Secondary | ICD-10-CM

## 2021-07-02 DIAGNOSIS — I5022 Chronic systolic (congestive) heart failure: Secondary | ICD-10-CM

## 2021-07-02 DIAGNOSIS — J09X2 Influenza due to identified novel influenza A virus with other respiratory manifestations: Secondary | ICD-10-CM | POA: Diagnosis not present

## 2021-07-02 DIAGNOSIS — I428 Other cardiomyopathies: Secondary | ICD-10-CM | POA: Diagnosis not present

## 2021-07-02 DIAGNOSIS — I69353 Hemiplegia and hemiparesis following cerebral infarction affecting right non-dominant side: Secondary | ICD-10-CM | POA: Diagnosis not present

## 2021-07-02 DIAGNOSIS — I110 Hypertensive heart disease with heart failure: Secondary | ICD-10-CM | POA: Diagnosis not present

## 2021-07-02 DIAGNOSIS — K250 Acute gastric ulcer with hemorrhage: Secondary | ICD-10-CM | POA: Diagnosis not present

## 2021-07-02 DIAGNOSIS — D509 Iron deficiency anemia, unspecified: Secondary | ICD-10-CM | POA: Diagnosis not present

## 2021-07-02 DIAGNOSIS — K260 Acute duodenal ulcer with hemorrhage: Secondary | ICD-10-CM | POA: Diagnosis not present

## 2021-07-02 NOTE — Progress Notes (Signed)
Primary Physician/Referring:  Renaye Rakers, MD  Patient ID: Linda Lowe, female    DOB: [birth year], 70 y.o.   MRN: [id number]  Chief Complaint  Patient presents with   Follow-up   Results   HPI:    Linda Lowe  is a 70 y.o. 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 She returned to [place] on  08/20/2019.   Patient presents for follow-up today.  She is without specific complaints.  Notably she is dependent on her husband given hemiparesis.  Since last office visit patient had episodes of low blood pressure which were symptomatic with dizziness and fatigue.  She followed up with PCP Dr. Parke Simmers, who stopped isosorbide dinitrate and hydralazine.  Since discontinuing these medications patient has been feeling well.  Denies chest pain, palpitations, syncope, near syncope, dizziness.  Past Medical History:  Diagnosis Date   Afib (HCC)    AICD (automatic cardioverter/defibrillator) present    St Jude    Arthritis    Cardiomyopathy (HCC)    a. 05/2012 Echo: EF 30-35%, Gr 1 DD, mild LVH, mild MR, pericardial effusion   Chronic systolic heart failure (HCC) 08/26/2013   Diabetes mellitus    a. Dx > 5 y ago   Dyspnea    Dysrhythmia    Hypercholesteremia    Hypertension    ICD Biventricular implantable cardioverter-defibrillator (ICD) in situ [employer] [phone] Quadra Assura 11/26/2013    St Jude    Pericardial effusion    a. 05/2012 Echo: circumferential pericardial effusion, mostly 110mm, largest pocket posterior - 20mm, mild RA indentation, no RV collapse, no tamponade.   Stroke [facility]) 04/29/2018   Right hemiparesis in [place]   Stroke [facility])     Family History  Problem Relation Age of Onset   Other Other        No history of CAD   Tuberculosis Father        died when pt was young.   Stroke Brother    Stroke Brother    Past Surgical History:  Procedure Laterality Date   ABDOMINAL HYSTERECTOMY     BI-VENTRICULAR IMPLANTABLE CARDIOVERTER DEFIBRILLATOR N/A 12/01/2013   St. Jude BI-VENTRICULAR IMPLANTABLE CARDIOVERTER DEFIBRILLATOR  (CRT-D);  Surgeon: Duke Salvia, MD;    BIOPSY  05/06/2021   Procedure: BIOPSY;  Surgeon: Jenel Lucks, MD;  Location: [facility] ENDOSCOPY;  Service: Gastroenterology;;   COLONOSCOPY WITH PROPOFOL Left 05/06/2021   Procedure: COLONOSCOPY WITH PROPOFOL;  Surgeon: Jenel Lucks, MD;  Location: [facility] ENDOSCOPY;  Service: Gastroenterology;  Laterality: Left;   ESOPHAGOGASTRODUODENOSCOPY (EGD) WITH PROPOFOL Left 05/06/2021   Procedure: ESOPHAGOGASTRODUODENOSCOPY (EGD) WITH PROPOFOL;  Surgeon: Jenel Lucks, MD;  Location: [facility] ENDOSCOPY;  Service: Gastroenterology;  Laterality: Left;   FOOT SURGERY     LEFT AND RIGHT HEART CATHETERIZATION WITH CORONARY ANGIOGRAM  05/26/2012   Procedure: LEFT AND RIGHT HEART CATHETERIZATION WITH CORONARY ANGIOGRAM;  Surgeon: Tonny Bollman, MD;  Location: [facility] CATH LAB;  Service: Cardiovascular;;   ORIF PATELLA Right 07/08/2017   Procedure: OPEN REDUCTION INTERNAL (ORIF) FIXATION PATELLA;  Surgeon: Sheral Apley, MD;  Location: [facility] OR;  Service: Orthopedics;  Laterality: Right;   Social History   Tobacco Use   Smoking status: Former  Packs/day: 2.00    Years: 3.00    Pack years: 6.00    Types: Cigarettes    Quit date: 29    Years since quitting: 36.8   Smokeless tobacco: Never   Tobacco comments:    Quit "long time ago."  Substance Use Topics   Alcohol use: No   Marital status: Married   ROS  Review of Systems  Constitutional: Negative for malaise/fatigue and weight gain.  Cardiovascular:  Negative for chest pain, claudication, dyspnea on exertion,  leg swelling, near-syncope, orthopnea, palpitations, paroxysmal nocturnal dyspnea and syncope.  Respiratory:  Negative for shortness of breath.   Gastrointestinal:  Negative for melena.  Neurological:  Positive for disturbances in coordination, focal weakness and paresthesias. Negative for dizziness.  Objective  Blood pressure 117/64, pulse 64, temperature 98 F (36.7 C), resp. rate 16, height 5\' 5"  (1.651 m), SpO2 98 %.  Vitals with BMI 07/02/2021 05/16/2021 05/16/2021  Height 5\' 5"  - -  Weight - - -  BMI - - -  Systolic 117 143 741  Diastolic 64 62 63  Pulse 64 62 66     Physical Exam Vitals reviewed.  Constitutional:      Comments: She is moderately built and mildly obese in no acute distress  Cardiovascular:     Rate and Rhythm: Normal rate and regular rhythm.     Pulses: Normal pulses and intact distal pulses.     Heart sounds: Murmur heard.  High-pitched blowing holosystolic murmur is present with a grade of 2/6 at the apex.    No gallop.     Comments: No JVD. Pulmonary:     Effort: Pulmonary effort is normal.     Breath sounds: Normal breath sounds.  Abdominal:     General: Bowel sounds are normal.     Palpations: Abdomen is soft.  Musculoskeletal:     Cervical back: Neck supple.     Right lower leg: No edema.     Left lower leg: No edema.  Neurological:     Comments: Right hemiparesis   Laboratory examination:   Recent Labs    05/07/21 0200 05/08/21 0347 05/16/21 0924 05/18/21 1039  NA 133* 134* 138 138  K 3.9 4.1 4.3 4.7  CL 101 102 105 99  CO2 26 27 26 23   GLUCOSE 210* 211* 112* 168*  BUN 9 8 20  37*  CREATININE 0.59 0.65 0.62 0.80  CALCIUM 8.4* 8.0* 9.1 9.5  GFRNONAA >60 >60 >60  --    CrCl cannot be calculated (Patient's most recent lab result is older than the maximum 21 days allowed.).  CMP Latest Ref Rng & Units 05/18/2021 05/16/2021 05/08/2021  Glucose 65 - 99 mg/dL 287(O) 676(H) 209(O)  BUN 8 - 27 mg/dL 70(J) 20 8  Creatinine 0.57 - 1.00 mg/dL  6.28 3.66 2.94  Sodium 134 - 144 mmol/L 138 138 134(L)  Potassium 3.5 - 5.2 mmol/L 4.7 4.3 4.1  Chloride 96 - 106 mmol/L 99 105 102  CO2 20 - 29 mmol/L 23 26 27   Calcium 8.7 - 10.3 mg/dL 9.5 9.1 8.0(L)  Total Protein 6.0 - 8.5 g/dL 7.7 6.9 -  Total Bilirubin 0.0 - 1.2 mg/dL <7.6 0.6 -  Alkaline Phos 44 - 121 IU/L 135(H) 95 -  AST 0 - 40 IU/L 26 19 -  ALT 0 - 32 IU/L 27 19 -   CBC Latest Ref Rng & Units 05/18/2021 05/16/2021 05/08/2021  WBC 3.4 - 10.8 x10E3/uL 8.3 11.5(H) 12.0(H)  Hemoglobin 11.1 -  15.9 g/dL 16.1 10.7(L) 8.8(L)  Hematocrit 34.0 - 46.6 % 38.9 34.1(L) 27.7(L)  Platelets 150 - 450 x10E3/uL 464(H) 415(H) 254   Lipid Panel Recent Labs    05/18/21 1039  CHOL 150  TRIG 95  LDLCALC 91  HDL 41    HEMOGLOBIN A1C Lab Results  Component Value Date   HGBA1C 6.3 (H) 05/18/2021   MPG 182.9 05/03/2021   A1C 8.100 % of 09/07/2019  TSH Recent Labs    05/18/21 1039  TSH 1.930   External labs:  A1C 7.000 % of 02/02/2020 TSH 1.300 02/02/2020  Hemoglobin 13.700 g/ 02/02/2020 Platelets 206.000 T 02/02/2020  Creatinine, Serum 0.560 09/23/2019 Potassium 4.000 mm 02/02/2020 ALT (SGPT) 39.000 U/L 02/02/2020  Allergies  No Known Allergies   Medications Prior to Visit:   Outpatient Medications Prior to Visit  Medication Sig Dispense Refill   atorvastatin (LIPITOR) 80 MG tablet Take 1 tablet (80 mg total) by mouth daily. 90 tablet 0   B-D UF III MINI PEN NEEDLES 31G X 5 MM MISC USE 5 NEEDLES EVERY DAY AS DIRECTED 200 each 0   carvedilol (COREG) 25 MG tablet Take 25 mg by mouth 2 (two) times daily.  4   Empagliflozin-metFORMIN HCl (SYNJARDY) 12.01-999 MG TABS Take 2 tablets by mouth every morning.     ferrous sulfate 325 (65 FE) MG tablet Take 1 tablet (325 mg total) by mouth 2 (two) times daily with a meal. 60 tablet 1   glucose blood (CONTOUR NEXT TEST) test strip Use as instructed 100 each 12   glucose blood (ONETOUCH VERIO) test strip USE TO TEST BLOOD SUGAR 3 TIMES DAILY  100 each 2   insulin degludec (TRESIBA FLEXTOUCH) 100 UNIT/ML FlexTouch Pen Inject 40 Units into the skin daily.     Insulin Pen Needle (B-D UF III MINI PEN NEEDLES) 31G X 5 MM MISC USE 5 PEN NEEDLES PER DAY AS INSTRUCTED 150 each 10   Insulin Pen Needle (B-D UF III MINI PEN NEEDLES) 31G X 5 MM MISC USE 5 NEEDLES EVERY DAY AS DIRECTED 200 each 0   irbesartan (AVAPRO) 300 MG tablet Take 1 tablet (300 mg total) by mouth daily. 90 tablet 0   NOVOLOG FLEXPEN 100 UNIT/ML FlexPen ADMINISTER 16 UNITS UNDER THE SKIN TWICE DAILY BEFORE LUNCH AND SUPPER (Patient taking differently: Inject 3-15 Units into the skin 2 (two) times daily as needed for high blood sugar.) 15 mL 0   ONETOUCH DELICA LANCETS 33G MISC USE TO TEST BLOOD SUGAR LEVELS THREE TIMES DAILY 100 each 0   potassium chloride SA (K-DUR,KLOR-CON) 20 MEQ tablet TAKE 1 TABLET(20 MEQ) BY MOUTH DAILY (Patient taking differently: Take 20 mEq by mouth daily.) 30 tablet 4   lidocaine (LIDODERM) 5 % 1 patch daily.     furosemide (LASIX) 20 MG tablet TAKE 1 TABLET(20 MG) BY MOUTH TWICE DAILY 180 tablet 1   hydrALAZINE (APRESOLINE) 25 MG tablet TAKE 1 TABLET(25 MG) BY MOUTH THREE TIMES DAILY (Patient not taking: No sig reported) 90 tablet 3   INVOKAMET [phone] MG TABS TAKE 1 TABLET BY MOUTH TWICE DAILY (Patient not taking: No sig reported) 60 tablet 0   isosorbide dinitrate (ISORDIL) 30 MG tablet TAKE 1 TABLET(30 MG) BY MOUTH THREE TIMES DAILY (Patient not taking: No sig reported) 270 tablet 3   pantoprazole (PROTONIX) 40 MG tablet Take 1 tablet (40 mg total) by mouth 2 (two) times daily. 60 tablet 1   VICTOZA 18 MG/3ML SOPN INJECT 1.8 MG UNDER THE  SKIN DAILY (Patient not taking: No sig reported) 9 mL 0   No facility-administered medications prior to visit.   Final Medications at End of Visit    Current Meds  Medication Sig   atorvastatin (LIPITOR) 80 MG tablet Take 1 tablet (80 mg total) by mouth daily.   B-D UF III MINI PEN NEEDLES 31G X 5 MM MISC  USE 5 NEEDLES EVERY DAY AS DIRECTED   carvedilol (COREG) 25 MG tablet Take 25 mg by mouth 2 (two) times daily.   Empagliflozin-metFORMIN HCl (SYNJARDY) 12.01-999 MG TABS Take 2 tablets by mouth every morning.   ferrous sulfate 325 (65 FE) MG tablet Take 1 tablet (325 mg total) by mouth 2 (two) times daily with a meal.   glucose blood (CONTOUR NEXT TEST) test strip Use as instructed   glucose blood (ONETOUCH VERIO) test strip USE TO TEST BLOOD SUGAR 3 TIMES DAILY   insulin degludec (TRESIBA FLEXTOUCH) 100 UNIT/ML FlexTouch Pen Inject 40 Units into the skin daily.   Insulin Pen Needle (B-D UF III MINI PEN NEEDLES) 31G X 5 MM MISC USE 5 PEN NEEDLES PER DAY AS INSTRUCTED   Insulin Pen Needle (B-D UF III MINI PEN NEEDLES) 31G X 5 MM MISC USE 5 NEEDLES EVERY DAY AS DIRECTED   irbesartan (AVAPRO) 300 MG tablet Take 1 tablet (300 mg total) by mouth daily.   NOVOLOG FLEXPEN 100 UNIT/ML FlexPen ADMINISTER 16 UNITS UNDER THE SKIN TWICE DAILY BEFORE LUNCH AND SUPPER (Patient taking differently: Inject 3-15 Units into the skin 2 (two) times daily as needed for high blood sugar.)   ONETOUCH DELICA LANCETS 33G MISC USE TO TEST BLOOD SUGAR LEVELS THREE TIMES DAILY   potassium chloride SA (K-DUR,KLOR-CON) 20 MEQ tablet TAKE 1 TABLET(20 MEQ) BY MOUTH DAILY (Patient taking differently: Take 20 mEq by mouth daily.)   Radiology:   CT Head 09/16/2019: No evidence of acute intracranial injury. 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  Cardiac Studies:   Coronary Angiogram   [06-23-2012]: No significant coronary artery disease. Ejection fraction 35%. Mild pulmonary hypertension.  Vascular ultrasound right upper extremity venous duplex 09/02/2019: Right Upper extremity:  No evidence of deep vein thrombosis in the upper extremity. No evidence of superficial vein thrombosis in the upper extremity.  There is a heterogenous area of the medial right upper arm that exhibits internal low resistant  vascularity.This is suggestive of possible malignancy versus unknown etiology. Ultrasound characteristics of prominent lymph nodes seen in the right neck in the area of the internal jugular vein.  CTA Chest 09/02/2019: 1. No pulmonary embolus.  No acute intrathoracic abnormality. 2. Dilatation of the main pulmonary artery, can be seen with pulmonary arterial hypertension.  Echocardiogram 10/12/2019:  Left ventricle cavity is normal in size. Mild concentric hypertrophy of  the left ventricle. Normal LV systolic function with visual EF 50-55%. Normal global wall motion. Doppler evidence of grade I (impaired)  diastolic dysfunction, normal LAP.  Left atrial cavity is mildly dilated.  Trileaflet aortic valve.  Trace aortic stenosis. Mild (Grade I) aortic  regurgitation.  Moderate, anteriorly directed, eccentric mitral regurgitation.  Mild tricuspid regurgitation. Estimated pulmonary artery systolic pressure  is 31 mmHg.  Mildly elevated PASP new since 2017.    ICD   Remote biventricular ICD transmission 05/22/2021: Longevity 7 months. AP 24%, BiV paced 96%. There was no high ventricular rate episodes. There were no mode switches. Thoracic impedance is suggesting mild fluid overload state, but returning towards normal.  Scheduled In office ICD  04/25/21  Single (S)/Dual (D)/BV (M) BV Presenting APBP Pacer dependant: No. Underlying NSR. AP 47%. BP 99% AMS Episodes 0.  HVR 0.  Longevity 6.3 months/Voltage.  Lead measurements: Stable Histogram: Low (L)/normal (N)/high (H) : Normal patient activity Low (<1 hour daily). Thoracic impedance: Suggestion of fluid overload 2 weeks ago, trending back to baseline  Observations: Normal BiV ICD function. Changes: None  EKG   03/22/2020: AV paced rhythm, biventricular pacemaker detected.  No further analysis. No significant change from EKG 09/18/2019   Assessment     ICD-10-CM   1. Nonischemic cardiomyopathy (HCC)  I42.8 EKG 12-Lead    2. ICD  Biventricular implantable cardioverter-defibrillator (ICD) in situ [employer] Jude [employer] [phone] Quadra Assura 11/26/2013  Z95.810     3. Chronic systolic heart failure (HCC)  S06.30      No orders of the defined types were placed in this encounter. Medications Discontinued During This Encounter  Medication Reason   isosorbide dinitrate (ISORDIL) 30 MG tablet Error   INVOKAMET [phone] MG TABS Error   hydrALAZINE (APRESOLINE) 25 MG tablet Error   furosemide (LASIX) 20 MG tablet Error   VICTOZA 18 MG/3ML SOPN Error   pantoprazole (PROTONIX) 40 MG tablet Error   Recommendations:   No orders of the defined types were placed in this encounter.   Linda Lowe  is a 70 y.o. 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 She returned to [place] on  08/20/2019.   Presents for follow-up of congestive heart failure, hypertension and hyperlipidemia.  Patient is feeling well without specific complaints today.  There is no clinical evidence of heart failure, she is euvolemic on exam.  Reviewed recent ICD checks with patient.  Lipids are well controlled.  Since PCP stopped isosorbide and hydralazine patient has had no recurrence of symptomatic hypotension.  Blood pressure remains well controlled.  Will not make changes to her medications at today's office visit.  Follow-up in 6 months, sooner if needed, for hypertension, hyperlipidemia, cardiomyopathy.   Rayford Halsted, PA-C 07/03/2021, 3:26 PM Office: [phone]

## 2021-07-03 ENCOUNTER — Encounter: Payer: Self-pay | Admitting: Student

## 2021-07-03 DIAGNOSIS — I10 Essential (primary) hypertension: Secondary | ICD-10-CM | POA: Diagnosis not present

## 2021-07-03 DIAGNOSIS — I110 Hypertensive heart disease with heart failure: Secondary | ICD-10-CM | POA: Diagnosis not present

## 2021-07-03 DIAGNOSIS — D509 Iron deficiency anemia, unspecified: Secondary | ICD-10-CM | POA: Diagnosis not present

## 2021-07-03 DIAGNOSIS — E1169 Type 2 diabetes mellitus with other specified complication: Secondary | ICD-10-CM | POA: Diagnosis not present

## 2021-07-03 DIAGNOSIS — J09X2 Influenza due to identified novel influenza A virus with other respiratory manifestations: Secondary | ICD-10-CM | POA: Diagnosis not present

## 2021-07-03 DIAGNOSIS — I639 Cerebral infarction, unspecified: Secondary | ICD-10-CM | POA: Diagnosis not present

## 2021-07-03 DIAGNOSIS — I69353 Hemiplegia and hemiparesis following cerebral infarction affecting right non-dominant side: Secondary | ICD-10-CM | POA: Diagnosis not present

## 2021-07-03 DIAGNOSIS — K250 Acute gastric ulcer with hemorrhage: Secondary | ICD-10-CM | POA: Diagnosis not present

## 2021-07-03 DIAGNOSIS — K260 Acute duodenal ulcer with hemorrhage: Secondary | ICD-10-CM | POA: Diagnosis not present

## 2021-07-04 DIAGNOSIS — I110 Hypertensive heart disease with heart failure: Secondary | ICD-10-CM | POA: Diagnosis not present

## 2021-07-04 DIAGNOSIS — K260 Acute duodenal ulcer with hemorrhage: Secondary | ICD-10-CM | POA: Diagnosis not present

## 2021-07-04 DIAGNOSIS — K250 Acute gastric ulcer with hemorrhage: Secondary | ICD-10-CM | POA: Diagnosis not present

## 2021-07-04 DIAGNOSIS — I69353 Hemiplegia and hemiparesis following cerebral infarction affecting right non-dominant side: Secondary | ICD-10-CM | POA: Diagnosis not present

## 2021-07-04 DIAGNOSIS — D509 Iron deficiency anemia, unspecified: Secondary | ICD-10-CM | POA: Diagnosis not present

## 2021-07-04 DIAGNOSIS — J09X2 Influenza due to identified novel influenza A virus with other respiratory manifestations: Secondary | ICD-10-CM | POA: Diagnosis not present

## 2021-07-05 DIAGNOSIS — J09X2 Influenza due to identified novel influenza A virus with other respiratory manifestations: Secondary | ICD-10-CM | POA: Diagnosis not present

## 2021-07-05 DIAGNOSIS — K250 Acute gastric ulcer with hemorrhage: Secondary | ICD-10-CM | POA: Diagnosis not present

## 2021-07-05 DIAGNOSIS — K260 Acute duodenal ulcer with hemorrhage: Secondary | ICD-10-CM | POA: Diagnosis not present

## 2021-07-05 DIAGNOSIS — I69353 Hemiplegia and hemiparesis following cerebral infarction affecting right non-dominant side: Secondary | ICD-10-CM | POA: Diagnosis not present

## 2021-07-05 DIAGNOSIS — I110 Hypertensive heart disease with heart failure: Secondary | ICD-10-CM | POA: Diagnosis not present

## 2021-07-05 DIAGNOSIS — D509 Iron deficiency anemia, unspecified: Secondary | ICD-10-CM | POA: Diagnosis not present

## 2021-07-06 ENCOUNTER — Encounter (HOSPITAL_COMMUNITY): Payer: Self-pay | Admitting: Radiology

## 2021-07-09 DIAGNOSIS — K250 Acute gastric ulcer with hemorrhage: Secondary | ICD-10-CM | POA: Diagnosis not present

## 2021-07-09 DIAGNOSIS — K260 Acute duodenal ulcer with hemorrhage: Secondary | ICD-10-CM | POA: Diagnosis not present

## 2021-07-09 DIAGNOSIS — I69353 Hemiplegia and hemiparesis following cerebral infarction affecting right non-dominant side: Secondary | ICD-10-CM | POA: Diagnosis not present

## 2021-07-09 DIAGNOSIS — I110 Hypertensive heart disease with heart failure: Secondary | ICD-10-CM | POA: Diagnosis not present

## 2021-07-09 DIAGNOSIS — D509 Iron deficiency anemia, unspecified: Secondary | ICD-10-CM | POA: Diagnosis not present

## 2021-07-09 DIAGNOSIS — J09X2 Influenza due to identified novel influenza A virus with other respiratory manifestations: Secondary | ICD-10-CM | POA: Diagnosis not present

## 2021-07-12 DIAGNOSIS — K250 Acute gastric ulcer with hemorrhage: Secondary | ICD-10-CM | POA: Diagnosis not present

## 2021-07-12 DIAGNOSIS — K260 Acute duodenal ulcer with hemorrhage: Secondary | ICD-10-CM | POA: Diagnosis not present

## 2021-07-12 DIAGNOSIS — I69353 Hemiplegia and hemiparesis following cerebral infarction affecting right non-dominant side: Secondary | ICD-10-CM | POA: Diagnosis not present

## 2021-07-12 DIAGNOSIS — D509 Iron deficiency anemia, unspecified: Secondary | ICD-10-CM | POA: Diagnosis not present

## 2021-07-12 DIAGNOSIS — J09X2 Influenza due to identified novel influenza A virus with other respiratory manifestations: Secondary | ICD-10-CM | POA: Diagnosis not present

## 2021-07-12 DIAGNOSIS — I110 Hypertensive heart disease with heart failure: Secondary | ICD-10-CM | POA: Diagnosis not present

## 2021-07-16 DIAGNOSIS — I110 Hypertensive heart disease with heart failure: Secondary | ICD-10-CM | POA: Diagnosis not present

## 2021-07-16 DIAGNOSIS — D509 Iron deficiency anemia, unspecified: Secondary | ICD-10-CM | POA: Diagnosis not present

## 2021-07-16 DIAGNOSIS — I69353 Hemiplegia and hemiparesis following cerebral infarction affecting right non-dominant side: Secondary | ICD-10-CM | POA: Diagnosis not present

## 2021-07-16 DIAGNOSIS — J09X2 Influenza due to identified novel influenza A virus with other respiratory manifestations: Secondary | ICD-10-CM | POA: Diagnosis not present

## 2021-07-16 DIAGNOSIS — K260 Acute duodenal ulcer with hemorrhage: Secondary | ICD-10-CM | POA: Diagnosis not present

## 2021-07-16 DIAGNOSIS — K250 Acute gastric ulcer with hemorrhage: Secondary | ICD-10-CM | POA: Diagnosis not present

## 2021-07-17 DIAGNOSIS — D509 Iron deficiency anemia, unspecified: Secondary | ICD-10-CM | POA: Diagnosis not present

## 2021-07-17 DIAGNOSIS — I10 Essential (primary) hypertension: Secondary | ICD-10-CM | POA: Diagnosis not present

## 2021-07-17 DIAGNOSIS — E1169 Type 2 diabetes mellitus with other specified complication: Secondary | ICD-10-CM | POA: Diagnosis not present

## 2021-07-17 DIAGNOSIS — E1165 Type 2 diabetes mellitus with hyperglycemia: Secondary | ICD-10-CM | POA: Diagnosis not present

## 2021-07-17 DIAGNOSIS — I110 Hypertensive heart disease with heart failure: Secondary | ICD-10-CM | POA: Diagnosis not present

## 2021-07-17 DIAGNOSIS — I5022 Chronic systolic (congestive) heart failure: Secondary | ICD-10-CM | POA: Diagnosis not present

## 2021-07-17 DIAGNOSIS — Z794 Long term (current) use of insulin: Secondary | ICD-10-CM | POA: Diagnosis not present

## 2021-07-17 DIAGNOSIS — I6930 Unspecified sequelae of cerebral infarction: Secondary | ICD-10-CM | POA: Diagnosis not present

## 2021-07-17 DIAGNOSIS — I69353 Hemiplegia and hemiparesis following cerebral infarction affecting right non-dominant side: Secondary | ICD-10-CM | POA: Diagnosis not present

## 2021-07-17 DIAGNOSIS — E039 Hypothyroidism, unspecified: Secondary | ICD-10-CM | POA: Diagnosis not present

## 2021-07-17 DIAGNOSIS — I959 Hypotension, unspecified: Secondary | ICD-10-CM | POA: Diagnosis not present

## 2021-08-07 ENCOUNTER — Other Ambulatory Visit: Payer: Self-pay | Admitting: Cardiology

## 2021-08-12 ENCOUNTER — Other Ambulatory Visit: Payer: Self-pay | Admitting: Cardiology

## 2021-08-12 DIAGNOSIS — E7800 Pure hypercholesterolemia, unspecified: Secondary | ICD-10-CM

## 2021-08-12 LAB — ECHOCARDIOGRAM COMPLETE
Area-P 1/2: 2.39 cm2
S' Lateral: 3 cm
Single Plane A4C EF: 48.1 %

## 2021-08-12 NOTE — Addendum Note (Signed)
Encounter addended by: Tessa Lerner, DO on: 08/12/2021 3:45 PM  Actions taken: Charge Capture section accepted

## 2021-08-12 NOTE — Progress Notes (Signed)
Echocardiogram 07/20/2021:   1. Left ventricular ejection fraction, by estimation, is 60 to 65%. The left ventricle has normal function. The left ventricle has no regional wall motion abnormalities. Left ventricular diastolic parameters were normal.  2. Right ventricular systolic function is normal. The right ventricular size is normal. Right ventricular wall thickness is increased.  3. The mitral valve is grossly normal. Trivial mitral valve regurgitation. No evidence of mitral stenosis.  4. The aortic valve is tricuspid. Aortic valve regurgitation is not visualized. Aortic valve sclerosis is present, with no evidence of aortic valve stenosis.  5. The inferior vena cava is normal in size with greater than 50% respiratory variability, suggesting right atrial pressure of 3 mmHg.

## 2021-08-16 DIAGNOSIS — E1169 Type 2 diabetes mellitus with other specified complication: Secondary | ICD-10-CM | POA: Diagnosis not present

## 2021-08-16 DIAGNOSIS — N3281 Overactive bladder: Secondary | ICD-10-CM | POA: Diagnosis not present

## 2021-08-16 DIAGNOSIS — I10 Essential (primary) hypertension: Secondary | ICD-10-CM | POA: Diagnosis not present

## 2021-08-16 DIAGNOSIS — I6930 Unspecified sequelae of cerebral infarction: Secondary | ICD-10-CM | POA: Diagnosis not present

## 2021-08-28 DIAGNOSIS — D509 Iron deficiency anemia, unspecified: Secondary | ICD-10-CM | POA: Diagnosis not present

## 2021-08-28 DIAGNOSIS — E1165 Type 2 diabetes mellitus with hyperglycemia: Secondary | ICD-10-CM | POA: Diagnosis not present

## 2021-08-28 DIAGNOSIS — I959 Hypotension, unspecified: Secondary | ICD-10-CM | POA: Diagnosis not present

## 2021-08-28 DIAGNOSIS — I69353 Hemiplegia and hemiparesis following cerebral infarction affecting right non-dominant side: Secondary | ICD-10-CM | POA: Diagnosis not present

## 2021-08-28 DIAGNOSIS — I5022 Chronic systolic (congestive) heart failure: Secondary | ICD-10-CM | POA: Diagnosis not present

## 2021-08-28 DIAGNOSIS — I110 Hypertensive heart disease with heart failure: Secondary | ICD-10-CM | POA: Diagnosis not present

## 2021-09-03 ENCOUNTER — Encounter (HOSPITAL_COMMUNITY): Payer: Self-pay | Admitting: Gastroenterology

## 2021-09-14 ENCOUNTER — Ambulatory Visit (HOSPITAL_COMMUNITY): Admission: RE | Admit: 2021-09-14 | Payer: Medicare Other | Source: Home / Self Care | Admitting: Gastroenterology

## 2021-09-14 ENCOUNTER — Encounter (HOSPITAL_COMMUNITY): Admission: RE | Payer: Self-pay | Source: Home / Self Care

## 2021-09-14 SURGERY — ESOPHAGOGASTRODUODENOSCOPY (EGD) WITH PROPOFOL
Anesthesia: Monitor Anesthesia Care

## 2021-10-01 ENCOUNTER — Telehealth: Payer: Self-pay

## 2021-10-01 NOTE — Telephone Encounter (Signed)
Called and spoke with husband, patient is in the [place] and will not be returning until the end of April 2023. Documented in [employer] as well.

## 2021-10-22 DIAGNOSIS — I959 Hypotension, unspecified: Secondary | ICD-10-CM | POA: Diagnosis not present

## 2021-10-22 DIAGNOSIS — D509 Iron deficiency anemia, unspecified: Secondary | ICD-10-CM | POA: Diagnosis not present

## 2021-10-22 DIAGNOSIS — K250 Acute gastric ulcer with hemorrhage: Secondary | ICD-10-CM | POA: Diagnosis not present

## 2021-10-22 DIAGNOSIS — I110 Hypertensive heart disease with heart failure: Secondary | ICD-10-CM | POA: Diagnosis not present

## 2021-10-22 DIAGNOSIS — I5022 Chronic systolic (congestive) heart failure: Secondary | ICD-10-CM | POA: Diagnosis not present

## 2021-10-22 DIAGNOSIS — K260 Acute duodenal ulcer with hemorrhage: Secondary | ICD-10-CM | POA: Diagnosis not present

## 2021-10-22 DIAGNOSIS — I69353 Hemiplegia and hemiparesis following cerebral infarction affecting right non-dominant side: Secondary | ICD-10-CM | POA: Diagnosis not present

## 2021-10-22 DIAGNOSIS — I429 Cardiomyopathy, unspecified: Secondary | ICD-10-CM | POA: Diagnosis not present

## 2021-10-22 DIAGNOSIS — E1165 Type 2 diabetes mellitus with hyperglycemia: Secondary | ICD-10-CM | POA: Diagnosis not present

## 2021-11-03 ENCOUNTER — Other Ambulatory Visit: Payer: Self-pay | Admitting: Student

## 2021-11-05 ENCOUNTER — Other Ambulatory Visit: Payer: Self-pay | Admitting: Cardiology

## 2021-12-31 ENCOUNTER — Ambulatory Visit: Payer: TRICARE For Life (TFL) | Admitting: Student

## 2022-02-10 ENCOUNTER — Other Ambulatory Visit: Payer: Self-pay | Admitting: Student

## 2022-03-09 DIAGNOSIS — E1169 Type 2 diabetes mellitus with other specified complication: Secondary | ICD-10-CM | POA: Diagnosis not present

## 2022-03-09 DIAGNOSIS — I509 Heart failure, unspecified: Secondary | ICD-10-CM | POA: Diagnosis not present

## 2022-03-09 DIAGNOSIS — I10 Essential (primary) hypertension: Secondary | ICD-10-CM | POA: Diagnosis not present

## 2022-03-09 DIAGNOSIS — M130 Polyarthritis, unspecified: Secondary | ICD-10-CM | POA: Diagnosis not present

## 2022-03-09 DIAGNOSIS — E782 Mixed hyperlipidemia: Secondary | ICD-10-CM | POA: Diagnosis not present

## 2022-03-13 DIAGNOSIS — R350 Frequency of micturition: Secondary | ICD-10-CM | POA: Diagnosis not present

## 2022-03-13 DIAGNOSIS — B3731 Acute candidiasis of vulva and vagina: Secondary | ICD-10-CM | POA: Diagnosis not present

## 2022-03-13 NOTE — Progress Notes (Unsigned)
Primary Physician/Referring:  Renaye Rakers, MD  Patient ID: Linda Lowe, female    DOB: 1951-04-04, 71 y.o.   MRN: [id number]  No chief complaint on file.  HPI:    Linda Lowe  is a 71 y.o. 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 She returned to [place] on  08/20/2019.   Patient was last seen in the office 07/02/2022 at which time she was stable and lipids were well controlled, no changes were made. Patient now presents for 6 month follow up. ***  ***Has patient transmitted since September?   Patient presents for follow-up today.  She is without specific complaints.  Notably she is dependent on her husband given hemiparesis.  Since last office visit patient had episodes of low blood pressure which were symptomatic with dizziness and fatigue.  She followed up with PCP Dr. Parke Simmers, who stopped isosorbide dinitrate and hydralazine.  Since discontinuing these medications patient has been feeling well.  Denies chest pain, palpitations, syncope, near syncope, dizziness.  Past Medical History:  Diagnosis Date   Afib (HCC)    AICD (automatic cardioverter/defibrillator) present    St Jude    Arthritis    Cardiomyopathy (HCC)    a. 05/2012 Echo: EF 30-35%, Gr 1 DD, mild LVH, mild MR, pericardial effusion   Chronic systolic heart failure (HCC) 08/26/2013   Diabetes mellitus    a. Dx > 5 y ago   Dyspnea    Dysrhythmia    Hypercholesteremia    Hypertension    ICD Biventricular implantable cardioverter-defibrillator (ICD) in situ [employer] [phone] [employer] 11/26/2013    St Jude    Pericardial effusion    a. 05/2012 Echo: circumferential pericardial  effusion, mostly 49mm, largest pocket posterior - 78mm, mild RA indentation, no RV collapse, no tamponade.   Stroke [facility]) 04/29/2018   Right hemiparesis in [place]   Stroke [facility])    Family History  Problem Relation Age of Onset   Other Other        No history of CAD   Tuberculosis Father        died when pt was young.   Stroke Brother    Stroke Brother    Past Surgical History:  Procedure Laterality Date   ABDOMINAL HYSTERECTOMY     BI-VENTRICULAR IMPLANTABLE CARDIOVERTER DEFIBRILLATOR N/A 12/01/2013   St. Jude BI-VENTRICULAR IMPLANTABLE CARDIOVERTER DEFIBRILLATOR  (CRT-D);  Surgeon: Duke Salvia, MD;    BIOPSY  05/06/2021   Procedure: BIOPSY;  Surgeon: Jenel Lucks, MD;  Location: [facility] ENDOSCOPY;  Service: Gastroenterology;;   COLONOSCOPY WITH PROPOFOL Left 05/06/2021   Procedure: COLONOSCOPY WITH PROPOFOL;  Surgeon: Jenel Lucks, MD;  Location: [facility] ENDOSCOPY;  Service: Gastroenterology;  Laterality: Left;   ESOPHAGOGASTRODUODENOSCOPY (EGD) WITH PROPOFOL Left 05/06/2021   Procedure: ESOPHAGOGASTRODUODENOSCOPY (EGD) WITH PROPOFOL;  Surgeon: Jenel Lucks, MD;  Location: [facility] ENDOSCOPY;  Service: Gastroenterology;  Laterality: Left;   FOOT SURGERY     LEFT AND RIGHT HEART CATHETERIZATION WITH CORONARY ANGIOGRAM  05/26/2012   Procedure: LEFT AND RIGHT HEART CATHETERIZATION WITH CORONARY ANGIOGRAM;  Surgeon: Tonny Bollman, MD;  Location: [facility] CATH LAB;  Service: Cardiovascular;;   ORIF PATELLA Right 07/08/2017   Procedure: OPEN REDUCTION INTERNAL (  ORIF) FIXATION PATELLA;  Surgeon: Sheral Apley, MD;  Location: [facility] OR;  Service: Orthopedics;  Laterality: Right;   Social History   Tobacco Use   Smoking status: Former    Packs/day: 2.00    Years: 3.00    Total pack years: 6.00    Types: Cigarettes    Quit date: 1986    Years since quitting: 37.5   Smokeless tobacco: Never   Tobacco comments:    Quit "long time ago."  Substance Use Topics   Alcohol use: No    Marital status: Married   ROS  Review of Systems  Constitutional: Negative for malaise/fatigue and weight gain.  Cardiovascular:  Negative for chest pain, claudication, dyspnea on exertion, leg swelling, near-syncope, orthopnea, palpitations, paroxysmal nocturnal dyspnea and syncope.  Respiratory:  Negative for shortness of breath.   Gastrointestinal:  Negative for melena.  Neurological:  Positive for disturbances in coordination, focal weakness and paresthesias. Negative for dizziness.   Objective  There were no vitals taken for this visit.     07/02/2021    1:34 PM 05/16/2021   12:00 PM 05/16/2021   11:34 AM  Vitals with BMI  Height     Systolic 117 143 045  Diastolic 64 62 63  Pulse 64 62 66     Physical Exam Vitals reviewed.  Constitutional:      Comments: She is moderately built and mildly obese in no acute distress  Cardiovascular:     Rate and Rhythm: Normal rate and regular rhythm.     Pulses: Normal pulses and intact distal pulses.     Heart sounds: Murmur heard.     High-pitched blowing holosystolic murmur is present with a grade of 2/6 at the apex.     No gallop.     Comments: No JVD. Pulmonary:     Effort: Pulmonary effort is normal.     Breath sounds: Normal breath sounds.  Abdominal:     General: Bowel sounds are normal.     Palpations: Abdomen is soft.  Musculoskeletal:     Cervical back: Neck supple.     Right lower leg: No edema.     Left lower leg: No edema.  Neurological:     Comments: Right hemiparesis    Laboratory examination:   Recent Labs    05/07/21 0200 05/08/21 0347 05/16/21 0924 05/18/21 1039  NA 133* 134* 138 138  K 3.9 4.1 4.3 4.7  CL 101 102 105 99  CO2 GLUCOSE 210* 211* 112* 168*  BUN 37*  CREATININE 0.59 0.65 0.62 0.80  CALCIUM 8.4* 8.0* 9.1 9.5  GFRNONAA >60 >60 >60  --     CrCl cannot be calculated (Patient's most recent lab result is older than the maximum 21 days allowed.).     Latest Ref  Rng & Units 05/18/2021   10:39 AM 05/16/2021    9:24 AM 05/08/2021    3:47 AM  CMP  Glucose 65 - 99 mg/dL 409  811  914   BUN 8 - 27 mg/dL 37  20  8   Creatinine 0.57 - 1.00 mg/dL 7.82  9.56  2.13   Sodium 134 - 144 mmol/L 138  138  134   Potassium 3.5 - 5.2 mmol/L 4.7  4.3  4.1   Chloride 96 - 106 mmol/L 99  105  102   CO2 20 - 29 mmol/L Calcium 8.7 -  10.3 mg/dL 9.5  9.1  8.0   Total Protein 6.0 - 8.5 g/dL 7.7  6.9    Total Bilirubin 0.0 - 1.2 mg/dL <1.6  0.6    Alkaline Phos 44 - 121 IU/L 135  95    AST 0 - 40 IU/L 26  19    ALT 0 - 32 IU/L 27  19        Latest Ref Rng & Units 05/18/2021   10:39 AM 05/16/2021    9:24 AM 05/08/2021    3:47 AM  CBC  WBC 3.4 - 10.8 x10E3/uL 8.3  11.5  12.0   Hemoglobin 11.1 - 15.9 g/dL 10.9  60.4  8.8   Hematocrit 34.0 - 46.6 % 38.9  34.1  27.7   Platelets 150 - 450 x10E3/uL 464  415  254    Lipid Panel Recent Labs    05/18/21 1039  CHOL 150  TRIG 95  LDLCALC 91  HDL 41     HEMOGLOBIN A1C Lab Results  Component Value Date   HGBA1C 6.3 (H) 05/18/2021   MPG 182.9 05/03/2021   A1C 8.100 % of 09/07/2019  TSH Recent Labs    05/18/21 1039  TSH 1.930    External labs:  A1C 7.000 % of 02/02/2020 TSH 1.300 02/02/2020  Hemoglobin 13.700 g/ 02/02/2020 Platelets 206.000 T 02/02/2020  Creatinine, Serum 0.560 09/23/2019 Potassium 4.000 mm 02/02/2020 ALT (SGPT) 39.000 U/L 02/02/2020  Allergies  No Known Allergies   Medications Prior to Visit:   Outpatient Medications Prior to Visit  Medication Sig Dispense Refill   atorvastatin (LIPITOR) 80 MG tablet TAKE 1 TABLET(80 MG) BY MOUTH DAILY 90 tablet 2   B-D UF III MINI PEN NEEDLES 31G X 5 MM MISC USE 5 NEEDLES EVERY DAY AS DIRECTED 200 each 0   carvedilol (COREG) 25 MG tablet Take 25 mg by mouth 2 (two) times daily.  4   Empagliflozin-metFORMIN HCl (SYNJARDY) 12.01-999 MG TABS Take 2 tablets by mouth every morning.     ferrous sulfate 325 (65 FE) MG tablet Take 1 tablet (325 mg  total) by mouth 2 (two) times daily with a meal. 60 tablet 1   glucose blood (CONTOUR NEXT TEST) test strip Use as instructed 100 each 12   glucose blood (ONETOUCH VERIO) test strip USE TO TEST BLOOD SUGAR 3 TIMES DAILY 100 each 2   insulin degludec (TRESIBA FLEXTOUCH) 100 UNIT/ML FlexTouch Pen Inject 40 Units into the skin daily.     Insulin Pen Needle (B-D UF III MINI PEN NEEDLES) 31G X 5 MM MISC USE 5 PEN NEEDLES PER DAY AS INSTRUCTED 150 each 10   Insulin Pen Needle (B-D UF III MINI PEN NEEDLES) 31G X 5 MM MISC USE 5 NEEDLES EVERY DAY AS DIRECTED 200 each 0   irbesartan (AVAPRO) 300 MG tablet TAKE 1 TABLET(300 MG) BY MOUTH DAILY 90 tablet 0   lidocaine (LIDODERM) 5 % 1 patch daily.     NOVOLOG FLEXPEN 100 UNIT/ML FlexPen ADMINISTER 16 UNITS UNDER THE SKIN TWICE DAILY BEFORE LUNCH AND SUPPER (Patient taking differently: Inject 3-15 Units into the skin 2 (two) times daily as needed for high blood sugar.) 15 mL 0   ONETOUCH DELICA LANCETS 33G MISC USE TO TEST BLOOD SUGAR LEVELS THREE TIMES DAILY 100 each 0   potassium chloride SA (K-DUR,KLOR-CON) 20 MEQ tablet TAKE 1 TABLET(20 MEQ) BY MOUTH DAILY (Patient taking differently: Take 20 mEq by mouth daily.) 30 tablet 4   No facility-administered medications prior  to visit.   Final Medications at End of Visit    No outpatient medications have been marked as taking for the 03/14/22 encounter (Appointment) with Carlynn Purl, Arleny Kruger C, PA-C.   Radiology:   CT Head 09/16/2019: No evidence of acute intracranial injury. 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  Cardiac Studies:   Coronary Angiogram   [2012/06/03]: No significant coronary artery disease. Ejection fraction 35%. Mild pulmonary hypertension.  Vascular ultrasound right upper extremity venous duplex 09/02/2019: Right Upper extremity:  No evidence of deep vein thrombosis in the upper extremity. No evidence of superficial vein thrombosis in the upper extremity.  There is a  heterogenous area of the medial right upper arm that exhibits internal low resistant vascularity.This is suggestive of possible malignancy versus unknown etiology. Ultrasound characteristics of prominent lymph nodes seen in the right neck in the area of the internal jugular vein.  CTA Chest 09/02/2019: 1. No pulmonary embolus.  No acute intrathoracic abnormality. 2. Dilatation of the main pulmonary artery, can be seen with pulmonary arterial hypertension.  Echocardiogram 07/20/2021:   1. Left ventricular ejection fraction, by estimation, is 60 to 65%. The left ventricle has normal function. The left ventricle has no regional wall motion abnormalities. Left ventricular diastolic parameters were normal.  2. Right ventricular systolic function is normal. The right ventricular size is normal. Right ventricular wall thickness is increased.  3. The mitral valve is grossly normal. Trivial mitral valve regurgitation. No evidence of mitral stenosis.  4. The aortic valve is tricuspid. Aortic valve regurgitation is not visualized. Aortic valve sclerosis is present, with no evidence of aortic valve stenosis.  5. The inferior vena cava is normal in size with greater than 50% respiratory variability, suggesting right atrial pressure of 3 mmHg.   ICD   Remote biventricular ICD transmission 05/22/2021: Longevity 7 months. AP 24%, BiV paced 96%. There was no high ventricular rate episodes. There were no mode switches. Thoracic impedance is suggesting mild fluid overload state, but returning towards normal.  Scheduled In office ICD 04/25/21  Single (S)/Dual (D)/BV (M) BV Presenting APBP Pacer dependant: No. Underlying NSR. AP 47%. BP 99% AMS Episodes 0.  HVR 0.  Longevity 6.3 months/Voltage.  Lead measurements: Stable Histogram: Low (L)/normal (N)/high (H) : Normal patient activity Low (<1 hour daily). Thoracic impedance: Suggestion of fluid overload 2 weeks ago, trending back to baseline  Observations:  Normal BiV ICD function. Changes: None  EKG  ***   03/22/2020: AV paced rhythm, biventricular pacemaker detected.  No further analysis. No significant change from EKG 09/18/2019   Assessment   No diagnosis found.  No orders of the defined types were placed in this encounter. There are no discontinued medications.  Recommendations:   No orders of the defined types were placed in this encounter.   Linda Lowe  is a 72 y.o. 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 She returned to [place] on 08/20/2019.   Patient was last seen in the office 07/02/2022 at which time she was stable and lipids were well controlled, no changes were made. Patient now presents for 6 month follow up. ***  ***  Presents for follow-up of congestive heart failure, hypertension and hyperlipidemia.  Patient is feeling well  without specific complaints today.  There is no clinical evidence of heart failure, she is euvolemic on exam.  Reviewed recent ICD checks with patient.  Lipids are well controlled.  Since PCP stopped isosorbide and hydralazine patient has had no recurrence of symptomatic hypotension.  Blood pressure remains well controlled.  Will not make changes to her medications at today's office visit.  Follow-up in 6 months, sooner if needed, for hypertension, hyperlipidemia, cardiomyopathy.   Rayford Halsted, PA-C 03/13/2022, 2:14 PM Office: [phone]

## 2022-03-14 ENCOUNTER — Encounter: Payer: Self-pay | Admitting: Student

## 2022-03-14 ENCOUNTER — Ambulatory Visit: Payer: Medicare Other | Admitting: Student

## 2022-03-14 VITALS — BP 131/74 | HR 60 | Temp 98.1°F | Resp 16 | Ht 65.0 in | Wt 142.2 lb

## 2022-03-14 DIAGNOSIS — I5022 Chronic systolic (congestive) heart failure: Secondary | ICD-10-CM

## 2022-03-14 DIAGNOSIS — Z95810 Presence of automatic (implantable) cardiac defibrillator: Secondary | ICD-10-CM | POA: Diagnosis not present

## 2022-03-14 DIAGNOSIS — I428 Other cardiomyopathies: Secondary | ICD-10-CM

## 2022-03-14 MED ORDER — FUROSEMIDE 40 MG PO TABS: 40.0000 mg | ORAL_TABLET | Freq: Every day | ORAL | 3 refills | Status: AC | PRN

## 2022-03-14 MED ORDER — POTASSIUM CHLORIDE CRYS ER 20 MEQ PO TBCR: 20.0000 meq | EXTENDED_RELEASE_TABLET | Freq: Every day | ORAL | 1 refills | Status: DC

## 2022-03-18 DIAGNOSIS — I428 Other cardiomyopathies: Secondary | ICD-10-CM | POA: Diagnosis not present

## 2022-03-19 LAB — BASIC METABOLIC PANEL
BUN/Creatinine Ratio: 37 — ABNORMAL HIGH (ref 12–28)
BUN: 29 mg/dL — ABNORMAL HIGH (ref 8–27)
CO2: 22 mmol/L (ref 20–29)
Calcium: 9 mg/dL (ref 8.7–10.3)
Chloride: 104 mmol/L (ref 96–106)
Creatinine, Ser: 0.79 mg/dL (ref 0.57–1.00)
Glucose: 109 mg/dL — ABNORMAL HIGH (ref 70–99)
Potassium: 4.6 mmol/L (ref 3.5–5.2)
Sodium: 139 mmol/L (ref 134–144)
eGFR: 81 mL/min/{1.73_m2} (ref >59)

## 2022-03-19 LAB — BRAIN NATRIURETIC PEPTIDE: BNP: 107.3 pg/mL — ABNORMAL HIGH (ref 0.0–100.0)

## 2022-03-20 DIAGNOSIS — Z794 Long term (current) use of insulin: Secondary | ICD-10-CM | POA: Diagnosis not present

## 2022-03-20 DIAGNOSIS — E039 Hypothyroidism, unspecified: Secondary | ICD-10-CM | POA: Diagnosis not present

## 2022-03-20 DIAGNOSIS — I10 Essential (primary) hypertension: Secondary | ICD-10-CM | POA: Diagnosis not present

## 2022-03-20 DIAGNOSIS — N3281 Overactive bladder: Secondary | ICD-10-CM | POA: Diagnosis not present

## 2022-03-20 DIAGNOSIS — E1165 Type 2 diabetes mellitus with hyperglycemia: Secondary | ICD-10-CM | POA: Diagnosis not present

## 2022-03-20 DIAGNOSIS — E1169 Type 2 diabetes mellitus with other specified complication: Secondary | ICD-10-CM | POA: Diagnosis not present

## 2022-03-21 DIAGNOSIS — K259 Gastric ulcer, unspecified as acute or chronic, without hemorrhage or perforation: Secondary | ICD-10-CM | POA: Diagnosis not present

## 2022-03-21 DIAGNOSIS — K5730 Diverticulosis of large intestine without perforation or abscess without bleeding: Secondary | ICD-10-CM | POA: Diagnosis not present

## 2022-03-21 DIAGNOSIS — D509 Iron deficiency anemia, unspecified: Secondary | ICD-10-CM | POA: Diagnosis not present

## 2022-03-29 DIAGNOSIS — L249 Irritant contact dermatitis, unspecified cause: Secondary | ICD-10-CM | POA: Diagnosis not present

## 2022-03-29 DIAGNOSIS — L239 Allergic contact dermatitis, unspecified cause: Secondary | ICD-10-CM | POA: Diagnosis not present

## 2022-04-05 DIAGNOSIS — Z794 Long term (current) use of insulin: Secondary | ICD-10-CM | POA: Diagnosis not present

## 2022-04-05 DIAGNOSIS — E1165 Type 2 diabetes mellitus with hyperglycemia: Secondary | ICD-10-CM | POA: Diagnosis not present

## 2022-04-05 DIAGNOSIS — L249 Irritant contact dermatitis, unspecified cause: Secondary | ICD-10-CM | POA: Diagnosis not present

## 2022-04-15 ENCOUNTER — Other Ambulatory Visit: Payer: Self-pay | Admitting: Family Medicine

## 2022-04-15 DIAGNOSIS — Z1231 Encounter for screening mammogram for malignant neoplasm of breast: Secondary | ICD-10-CM

## 2022-04-30 ENCOUNTER — Ambulatory Visit
Admission: RE | Admit: 2022-04-30 | Discharge: 2022-04-30 | Disposition: A | Discharge status: Home / Self Care | Payer: Medicare Other | Source: Ambulatory Visit | Attending: Family Medicine | Admitting: Family Medicine

## 2022-04-30 DIAGNOSIS — Z1231 Encounter for screening mammogram for malignant neoplasm of breast: Secondary | ICD-10-CM

## 2022-05-10 ENCOUNTER — Telehealth: Payer: Self-pay

## 2022-05-10 ENCOUNTER — Other Ambulatory Visit: Payer: Self-pay

## 2022-05-10 MED ORDER — IRBESARTAN 300 MG PO TABS: ORAL_TABLET | ORAL | 0 refills | Status: DC

## 2022-05-10 NOTE — Telephone Encounter (Signed)
Patient husband called and left a message requesting refills. I could not quite understand the message and I tried calling him back, NA, LMAM. I have been trying to contact this patient, so if anyone receives the call, please let me know, I need to speak to her regarding her remote transmissions.

## 2022-05-13 ENCOUNTER — Other Ambulatory Visit: Payer: Self-pay | Admitting: Cardiology

## 2022-05-15 DIAGNOSIS — Z4502 Encounter for adjustment and management of automatic implantable cardiac defibrillator: Secondary | ICD-10-CM | POA: Diagnosis not present

## 2022-05-15 DIAGNOSIS — Z95810 Presence of automatic (implantable) cardiac defibrillator: Secondary | ICD-10-CM | POA: Diagnosis not present

## 2022-05-15 DIAGNOSIS — I5022 Chronic systolic (congestive) heart failure: Secondary | ICD-10-CM | POA: Diagnosis not present

## 2022-05-16 DIAGNOSIS — E782 Mixed hyperlipidemia: Secondary | ICD-10-CM | POA: Diagnosis not present

## 2022-05-16 DIAGNOSIS — E1169 Type 2 diabetes mellitus with other specified complication: Secondary | ICD-10-CM | POA: Diagnosis not present

## 2022-05-17 NOTE — Telephone Encounter (Signed)
Patient husband called back, he said the refill was taken care of and the transmitter is back online.

## 2022-05-17 NOTE — Telephone Encounter (Signed)
Called patient, NA, LMAM

## 2022-05-23 NOTE — Telephone Encounter (Signed)
Only available date : Monday 18th @ 12pm, patient husband aware.

## 2022-05-30 ENCOUNTER — Other Ambulatory Visit: Payer: Self-pay | Admitting: Gastroenterology

## 2022-06-03 ENCOUNTER — Ambulatory Visit: Payer: Medicare Other | Admitting: Cardiology

## 2022-06-03 ENCOUNTER — Ambulatory Visit: Payer: TRICARE For Life (TFL) | Admitting: Cardiology

## 2022-06-03 ENCOUNTER — Encounter: Payer: Self-pay | Admitting: Cardiology

## 2022-06-03 VITALS — BP 129/65 | HR 63 | Resp 16 | Ht 65.0 in | Wt 141.0 lb

## 2022-06-03 DIAGNOSIS — I5022 Chronic systolic (congestive) heart failure: Secondary | ICD-10-CM

## 2022-06-03 DIAGNOSIS — Z95810 Presence of automatic (implantable) cardiac defibrillator: Secondary | ICD-10-CM

## 2022-06-03 DIAGNOSIS — Z4502 Encounter for adjustment and management of automatic implantable cardiac defibrillator: Secondary | ICD-10-CM

## 2022-06-03 DIAGNOSIS — I428 Other cardiomyopathies: Secondary | ICD-10-CM

## 2022-06-03 NOTE — Progress Notes (Signed)
Primary Physician/Referring:  Renaye Rakers, MD  Patient ID: Linda Lowe, female    DOB: November 22, 1950, 71 y.o.   MRN: [id number]  Chief Complaint  Patient presents with   Pacemaker Check   Follow-up   HPI:    Linda Lowe  is a 71 y.o. 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   Patient was last seen by [place] in June 2023, and on July 2023 she went back to [place] and just returned back home, her ICD revealed ERI.  I will refer for EP for generator change. Denies chest pain, palpitations, syncope, near syncope, dizziness. Notably patient has not transmitted pacemaker information in several months as she was in the [place]   Past Medical History:  Diagnosis Date   Afib (HCC)    AICD (automatic cardioverter/defibrillator) present    St Jude    Arthritis    Cardiomyopathy (HCC)    a. 05/2012 Echo: EF 30-35%, Gr 1 DD, mild LVH, mild MR, pericardial effusion   Chronic systolic heart failure (HCC) 08/26/2013   Diabetes mellitus    a. Dx > 5 y ago   Dyspnea    Dysrhythmia    Hypercholesteremia    Hypertension    ICD Biventricular implantable cardioverter-defibrillator (ICD) in situ [employer] [phone] Quadra Assura 11/26/2013    St Jude    Pericardial effusion    a. 05/2012 Echo: circumferential pericardial effusion, mostly 10mm, largest pocket posterior - 23mm, mild RA indentation, no RV collapse, no tamponade.   Stroke [facility]) 04/29/2018   Right hemiparesis in [place]   Stroke [facility])    Family History  Problem Relation Age of Onset   Other Other        No history of CAD   Tuberculosis Father        died when pt was young.   Stroke  Brother    Stroke Brother    Past Surgical History:  Procedure Laterality Date   ABDOMINAL HYSTERECTOMY     BI-VENTRICULAR IMPLANTABLE CARDIOVERTER DEFIBRILLATOR N/A 12/01/2013   St. Jude BI-VENTRICULAR IMPLANTABLE CARDIOVERTER DEFIBRILLATOR  (CRT-D);  Surgeon: Duke Salvia, MD;    BIOPSY  05/06/2021   Procedure: BIOPSY;  Surgeon: Jenel Lucks, MD;  Location: [facility] ENDOSCOPY;  Service: Gastroenterology;;   COLONOSCOPY WITH PROPOFOL Left 05/06/2021   Procedure: COLONOSCOPY WITH PROPOFOL;  Surgeon: Jenel Lucks, MD;  Location: [facility] ENDOSCOPY;  Service: Gastroenterology;  Laterality: Left;   ESOPHAGOGASTRODUODENOSCOPY (EGD) WITH PROPOFOL Left 05/06/2021   Procedure: ESOPHAGOGASTRODUODENOSCOPY (EGD) WITH PROPOFOL;  Surgeon: Jenel Lucks, MD;  Location: [facility] ENDOSCOPY;  Service: Gastroenterology;  Laterality: Left;   FOOT SURGERY     LEFT AND RIGHT HEART CATHETERIZATION WITH CORONARY ANGIOGRAM  05/26/2012   Procedure: LEFT AND RIGHT HEART CATHETERIZATION WITH CORONARY ANGIOGRAM;  Surgeon: Tonny Bollman, MD;  Location: [facility] CATH LAB;  Service: Cardiovascular;;   ORIF PATELLA Right 07/08/2017   Procedure: OPEN REDUCTION INTERNAL (ORIF) FIXATION PATELLA;  Surgeon: Sheral Apley, MD;  Location: [facility] OR;  Service: Orthopedics;  Laterality: Right;   Social History   Tobacco Use   Smoking status: Former    Packs/day: 2.00    Years: 3.00    Total pack years: 6.00  Types: Cigarettes    Quit date: 104    Years since quitting: 37.7   Smokeless tobacco: Never   Tobacco comments:    Quit "long time ago."  Substance Use Topics   Alcohol use: No   Marital status: Married   ROS  Review of Systems  Cardiovascular:  Negative for chest pain, dyspnea on exertion, leg swelling, palpitations and syncope.   Objective  Blood pressure 129/65, pulse 63, resp. rate 16, height 5\' 5"  (1.651 m), weight 141 lb (64 kg), SpO2 97 %.     06/03/2022   12:58 PM 03/14/2022   10:18 AM 07/02/2021     1:34 PM  Vitals with BMI  Height 5\' 5"  5\' 5"  5\' 5"   Weight 141 lbs 142 lbs 3 oz   BMI 23.46 23.66   Systolic 129 131 494  Diastolic 65 74 64  Pulse 63 60 64     Physical Exam Neck:     Vascular: No carotid bruit or JVD.  Cardiovascular:     Rate and Rhythm: Normal rate and regular rhythm.     Pulses: Intact distal pulses.     Heart sounds: Normal heart sounds. No murmur heard.    No gallop.  Pulmonary:     Effort: Pulmonary effort is normal.     Breath sounds: Normal breath sounds.  Abdominal:     General: Bowel sounds are normal.     Palpations: Abdomen is soft.  Musculoskeletal:     Right lower leg: No edema.     Left lower leg: No edema.    Laboratory examination:   External labs:  Cholesterol, total 166.000 m 03/09/2022 HDL 68.000 mg 03/09/2022 LDL 82.000 mg 03/09/2022 Triglycerides 84.000 mg 03/09/2022  A1C 12.300 % o 03/09/2022 TSH 1.930 05/18/2021  Hemoglobin 14.200 g/d 03/09/2022 Platelets 210.000 Th 03/09/2022  Creatinine, Serum 0.790 mg/ 03/18/2022 Potassium 4.600 mm 03/18/2022 ALT (SGPT) 13.000 U/L 03/09/2022  BNP 107.300 p 03/18/2022  TSH 1.300 02/02/2020  Allergies  No Known Allergies   Medications    Current Outpatient Medications:    atorvastatin (LIPITOR) 80 MG tablet, TAKE 1 TABLET(80 MG) BY MOUTH DAILY, Disp: 90 tablet, Rfl: 2   B-D UF III MINI PEN NEEDLES 31G X 5 MM MISC, USE 5 NEEDLES EVERY DAY AS DIRECTED, Disp: 200 each, Rfl: 0   carvedilol (COREG) 25 MG tablet, Take 25 mg by mouth 2 (two) times daily., Disp: , Rfl: 4   ferrous sulfate 325 (65 FE) MG tablet, Take 1 tablet (325 mg total) by mouth 2 (two) times daily with a meal., Disp: 60 tablet, Rfl: 1   furosemide (LASIX) 40 MG tablet, Take 1 tablet (40 mg total) by mouth daily as needed for fluid., Disp: 30 tablet, Rfl: 3   glucose blood (CONTOUR NEXT TEST) test strip, Use as instructed, Disp: 100 each, Rfl: 12   glucose blood (ONETOUCH VERIO) test strip, USE TO TEST BLOOD SUGAR 3 TIMES DAILY,  Disp: 100 each, Rfl: 2   hydrALAZINE (APRESOLINE) 25 MG tablet, Take 1 tablet by mouth daily., Disp: , Rfl:    insulin degludec (TRESIBA FLEXTOUCH) 100 UNIT/ML FlexTouch Pen, Inject 36 Units into the skin daily., Disp: , Rfl:    Insulin Pen Needle (B-D UF III MINI PEN NEEDLES) 31G X 5 MM MISC, USE 5 PEN NEEDLES PER DAY AS INSTRUCTED, Disp: 150 each, Rfl: 10   Insulin Pen Needle (B-D UF III MINI PEN NEEDLES) 31G X 5 MM MISC, USE 5 NEEDLES EVERY DAY AS  DIRECTED, Disp: 200 each, Rfl: 0   irbesartan (AVAPRO) 300 MG tablet, TAKE 1 TABLET(300 MG) BY MOUTH DAILY, Disp: 90 tablet, Rfl: 0   isosorbide dinitrate (ISORDIL) 30 MG tablet, Take 1 tablet by mouth in the morning, at noon, and at bedtime., Disp: , Rfl:    lidocaine (LIDODERM) 5 %, 1 patch daily., Disp: , Rfl:    miconazole (MICOTIN) 2 % cream, Apply 1 Application topically as needed., Disp: , Rfl:    NOVOLOG FLEXPEN 100 UNIT/ML FlexPen, ADMINISTER 16 UNITS UNDER THE SKIN TWICE DAILY BEFORE LUNCH AND SUPPER (Patient taking differently: Inject 3-15 Units into the skin 2 (two) times daily as needed for high blood sugar.), Disp: 15 mL, Rfl: 0   ONETOUCH DELICA LANCETS 33G MISC, USE TO TEST BLOOD SUGAR LEVELS THREE TIMES DAILY, Disp: 100 each, Rfl: 0   potassium chloride SA (KLOR-CON M) 20 MEQ tablet, Take 1 tablet (20 mEq total) by mouth daily., Disp: 30 tablet, Rfl: 1   Radiology:   CT Head 09/16/2019: No evidence of acute intracranial injury. 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  Cardiac Studies:   Coronary Angiogram   [05-31-12]: No significant coronary artery disease. Ejection fraction 35%. Mild pulmonary hypertension.  Echocardiogram 07/20/2021:   1. Left ventricular ejection fraction, by estimation, is 60 to 65%. The left ventricle has normal function. The left ventricle has no regional wall motion abnormalities. Left ventricular diastolic parameters were normal.  2. Right ventricular systolic function is normal. The right  ventricular size is normal. Right ventricular wall thickness is increased.  3. The mitral valve is grossly normal. Trivial mitral valve regurgitation. No evidence of mitral stenosis.  4. The aortic valve is tricuspid. Aortic valve regurgitation is not visualized. Aortic valve sclerosis is present, with no evidence of aortic valve stenosis.  5. The inferior vena cava is normal in size with greater than 50% respiratory variability, suggesting right atrial pressure of 3 mmHg.   ICD Biventricular (ICD) in situ [employer] [phone] Dillon Bjork 11/26/2013   Remote CRT-D transmission 05/15/2022: Battery has reached end of service to be replaced, voltage 2.56. Patient had been disconnected since 01/19/2021.  AP 61%, CRT 97%.  Lead impedance and thresholds have been stable over the past 1 year.  There are brief AT/AF episodes, longest 10 seconds.  1 NSVT episode for 4 seconds.  Thoracic impedance does not suggest volume overload.  Scheduled  In office ICD 06/03/22  Single (S)/Dual (D)/BV (M) M Presenting APVP Pacer dependant: No. Underlying . AP  and VS @ 40.  AP 63%. BP 97.% AMS Episodes 11.  Brief AT. HVR 1. Longest 4Sec, Latest 10/18/21.  PVC burden 1.7%. Longevity ERI Years/Voltage.  Lead measurements: Stable Histogram: Low (L)/normal (N)/high (H)  Normal Patient activity Low. Thoracic impedance: Thoracic impedance suggest fluid overload state for the last 7 days.  Observations:  Normal BV ICD function, needs elective generator change.  Changes: None  EKG  03/14/22:AV paced rhythm, biventricular pacemaker detected.  No further analysis. No significant change from EKG 03/22/2020   Assessment     ICD-10-CM   1. Encounter for management of biventricular implantable cardioverter-defibrillator (ICD)  Z45.02     2. ICD Biventricular implantable cardioverter-defibrillator (ICD) in situ [employer] [phone] Quadra Assura 11/26/2013  Z95.810     3. Chronic systolic heart failure (HCC)   I50.22     4. Implantable cardioverter-defibrillator (ICD) at end of device life  Z45.02       No orders  of the defined types were placed in this encounter.  There are no discontinued medications.  Recommendations:   No orders of the defined types were placed in this encounter.   Linda Lowe  is a 71 y.o. 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   Patient was last seen by [place] in June 2023, and on July 2023 she went back to [place] and just returned back home, her ICD revealed ERI.  I will refer for EP for generator change.  She denies any chest pain or dyspnea but has 7/12.  She does have faint bibasilar crackles, thoracic impedance is above baseline suggestive of fluid overload state.  Patient admits to having eaten fast food last week, we discussed regarding low-salt diet.  She will start taking furosemide on a daily basis for the next 5 days.  Fortunately no florid heart failure and no PND or orthopnea or leg edema.  Diabetes continues to be uncontrolled.  Fortunately renal function is normal, lipids are also normal.  I would like to see her back in 3 months for follow-up.    Adrian Prows, PA-C 06/03/2022, 1:19 PM Office: [phone]

## 2022-06-03 NOTE — Progress Notes (Signed)
Primary Physician/Referring:  Renaye Rakers, MD  Patient ID: Linda Lowe, female    DOB: 1951-05-01, 71 y.o.   MRN: [id number]  Chief Complaint  Patient presents with   ICD Check   HPI:    Linda Lowe  is a 71 y.o. 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   Patient was last seen by [place] in June 2023, and on July 2023 she went back to [place] and just returned back home, her ICD revealed ERI.  I will refer for EP for generator change. Denies chest pain, palpitations, syncope, near syncope, dizziness. Notably patient has not transmitted pacemaker information in several months as she was in the [place]   Past Medical History:  Diagnosis Date   Afib (HCC)    AICD (automatic cardioverter/defibrillator) present    St Jude    Arthritis    Cardiomyopathy (HCC)    a. 05/2012 Echo: EF 30-35%, Gr 1 DD, mild LVH, mild MR, pericardial effusion   Chronic systolic heart failure (HCC) 08/26/2013   Diabetes mellitus    a. Dx > 5 y ago   Dyspnea    Dysrhythmia    Hypercholesteremia    Hypertension    ICD Biventricular implantable cardioverter-defibrillator (ICD) in situ [employer] [phone] Quadra Assura 11/26/2013    St Jude    Pericardial effusion    a. 05/2012 Echo: circumferential pericardial effusion, mostly 31mm, largest pocket posterior - 53mm, mild RA indentation, no RV collapse, no tamponade.   Stroke [facility]) 04/29/2018   Right hemiparesis in [place]   Stroke [facility])    Family History  Problem Relation Age of Onset   Other Other        No history of CAD   Tuberculosis Father        died when pt was young.   Stroke Brother    Stroke  Brother    Past Surgical History:  Procedure Laterality Date   ABDOMINAL HYSTERECTOMY     BI-VENTRICULAR IMPLANTABLE CARDIOVERTER DEFIBRILLATOR N/A 12/01/2013   St. Jude BI-VENTRICULAR IMPLANTABLE CARDIOVERTER DEFIBRILLATOR  (CRT-D);  Surgeon: Duke Salvia, MD;    BIOPSY  05/06/2021   Procedure: BIOPSY;  Surgeon: Jenel Lucks, MD;  Location[facility] ENDOSCOPY;  Service: Gastroenterology;;   COLONOSCOPY WITH PROPOFOL Left 05/06/2021   Procedure: COLONOSCOPY WITH PROPOFOL;  Surgeon: Jenel Lucks, MD;  Location: [facility] ENDOSCOPY;  Service: Gastroenterology;  Laterality: Left;   ESOPHAGOGASTRODUODENOSCOPY (EGD) WITH PROPOFOL Left 05/06/2021   Procedure: ESOPHAGOGASTRODUODENOSCOPY (EGD) WITH PROPOFOL;  Surgeon: Jenel Lucks, MD;  Location: [facility] ENDOSCOPY;  Service: Gastroenterology;  Laterality: Left;   FOOT SURGERY     LEFT AND RIGHT HEART CATHETERIZATION WITH CORONARY ANGIOGRAM  05/26/2012   Procedure: LEFT AND RIGHT HEART CATHETERIZATION WITH CORONARY ANGIOGRAM;  Surgeon: Tonny Bollman, MD;  Location: [facility] CATH LAB;  Service: Cardiovascular;;   ORIF PATELLA Right 07/08/2017   Procedure: OPEN REDUCTION INTERNAL (ORIF) FIXATION PATELLA;  Surgeon: Sheral Apley, MD;  Location: [facility] OR;  Service: Orthopedics;  Laterality: Right;   Social History   Tobacco Use   Smoking status: Former    Packs/day: 2.00    Years: 3.00    Total pack years: 6.00    Types: Cigarettes  Quit date: 57    Years since quitting: 37.7   Smokeless tobacco: Never   Tobacco comments:    Quit "long time ago."  Substance Use Topics   Alcohol use: No   Marital status: Married   ROS  Review of Systems  Cardiovascular:  Negative for chest pain, dyspnea on exertion, leg swelling, palpitations and syncope.   Objective  There were no vitals taken for this visit.     06/03/2022   12:58 PM 03/14/2022   10:18 AM 07/02/2021    1:34 PM  Vitals with BMI  Height 5\' 5"  5\' 5"  5\' 5"   Weight 141 lbs 142 lbs 3 oz    BMI 23.46 23.66   Systolic 129 131 503  Diastolic 65 74 64  Pulse 63 60 64     Physical Exam Neck:     Vascular: No carotid bruit or JVD.  Cardiovascular:     Rate and Rhythm: Normal rate and regular rhythm.     Pulses: Intact distal pulses.     Heart sounds: Normal heart sounds. No murmur heard.    No gallop.  Pulmonary:     Effort: Pulmonary effort is normal.     Breath sounds: Normal breath sounds.  Abdominal:     General: Bowel sounds are normal.     Palpations: Abdomen is soft.  Musculoskeletal:     Right lower leg: No edema.     Left lower leg: No edema.    Laboratory examination:   External labs:  Cholesterol, total 166.000 m 03/09/2022 HDL 68.000 mg 03/09/2022 LDL 82.000 mg 03/09/2022 Triglycerides 84.000 mg 03/09/2022  A1C 12.300 % o 03/09/2022 TSH 1.930 05/18/2021  Hemoglobin 14.200 g/d 03/09/2022 Platelets 210.000 Th 03/09/2022  Creatinine, Serum 0.790 mg/ 03/18/2022 Potassium 4.600 mm 03/18/2022 ALT (SGPT) 13.000 U/L 03/09/2022  BNP 107.300 p 03/18/2022  TSH 1.300 02/02/2020  Allergies  No Known Allergies   Medications    Current Outpatient Medications:    atorvastatin (LIPITOR) 80 MG tablet, TAKE 1 TABLET(80 MG) BY MOUTH DAILY, Disp: 90 tablet, Rfl: 2   B-D UF III MINI PEN NEEDLES 31G X 5 MM MISC, USE 5 NEEDLES EVERY DAY AS DIRECTED, Disp: 200 each, Rfl: 0   carvedilol (COREG) 25 MG tablet, Take 25 mg by mouth 2 (two) times daily., Disp: , Rfl: 4   ferrous sulfate 325 (65 FE) MG tablet, Take 1 tablet (325 mg total) by mouth 2 (two) times daily with a meal., Disp: 60 tablet, Rfl: 1   furosemide (LASIX) 40 MG tablet, Take 1 tablet (40 mg total) by mouth daily as needed for fluid., Disp: 30 tablet, Rfl: 3   glucose blood (CONTOUR NEXT TEST) test strip, Use as instructed, Disp: 100 each, Rfl: 12   glucose blood (ONETOUCH VERIO) test strip, USE TO TEST BLOOD SUGAR 3 TIMES DAILY, Disp: 100 each, Rfl: 2   hydrALAZINE (APRESOLINE) 25 MG tablet, Take 1 tablet by  mouth daily., Disp: , Rfl:    insulin degludec (TRESIBA FLEXTOUCH) 100 UNIT/ML FlexTouch Pen, Inject 36 Units into the skin daily., Disp: , Rfl:    Insulin Pen Needle (B-D UF III MINI PEN NEEDLES) 31G X 5 MM MISC, USE 5 PEN NEEDLES PER DAY AS INSTRUCTED, Disp: 150 each, Rfl: 10   Insulin Pen Needle (B-D UF III MINI PEN NEEDLES) 31G X 5 MM MISC, USE 5 NEEDLES EVERY DAY AS DIRECTED, Disp: 200 each, Rfl: 0   irbesartan (AVAPRO) 300 MG tablet, TAKE 1 TABLET(300 MG) BY  MOUTH DAILY, Disp: 90 tablet, Rfl: 0   isosorbide dinitrate (ISORDIL) 30 MG tablet, Take 1 tablet by mouth in the morning, at noon, and at bedtime., Disp: , Rfl:    lidocaine (LIDODERM) 5 %, 1 patch daily., Disp: , Rfl:    miconazole (MICOTIN) 2 % cream, Apply 1 Application topically as needed., Disp: , Rfl:    NOVOLOG FLEXPEN 100 UNIT/ML FlexPen, ADMINISTER 16 UNITS UNDER THE SKIN TWICE DAILY BEFORE LUNCH AND SUPPER (Patient taking differently: Inject 3-15 Units into the skin 2 (two) times daily as needed for high blood sugar.), Disp: 15 mL, Rfl: 0   ONETOUCH DELICA LANCETS 33G MISC, USE TO TEST BLOOD SUGAR LEVELS THREE TIMES DAILY, Disp: 100 each, Rfl: 0   potassium chloride SA (KLOR-CON M) 20 MEQ tablet, Take 1 tablet (20 mEq total) by mouth daily., Disp: 30 tablet, Rfl: 1   Radiology:   CT Head 09/16/2019: No evidence of acute intracranial injury. 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  Cardiac Studies:   Coronary Angiogram   [2012-05-28]: No significant coronary artery disease. Ejection fraction 35%. Mild pulmonary hypertension.  Echocardiogram 07/20/2021:   1. Left ventricular ejection fraction, by estimation, is 60 to 65%. The left ventricle has normal function. The left ventricle has no regional wall motion abnormalities. Left ventricular diastolic parameters were normal.  2. Right ventricular systolic function is normal. The right ventricular size is normal. Right ventricular wall thickness is increased.  3.  The mitral valve is grossly normal. Trivial mitral valve regurgitation. No evidence of mitral stenosis.  4. The aortic valve is tricuspid. Aortic valve regurgitation is not visualized. Aortic valve sclerosis is present, with no evidence of aortic valve stenosis.  5. The inferior vena cava is normal in size with greater than 50% respiratory variability, suggesting right atrial pressure of 3 mmHg.   ICD Biventricular (ICD) in situ [employer] [phone] Dillon Bjork 11/26/2013   Remote CRT-D transmission 05/15/2022: Battery has reached end of service to be replaced, voltage 2.56. Patient had been disconnected since 01/19/2021.  AP 61%, CRT 97%.  Lead impedance and thresholds have been stable over the past 1 year.  There are brief AT/AF episodes, longest 10 seconds.  1 NSVT episode for 4 seconds.  Thoracic impedance does not suggest volume overload.  Scheduled  In office ICD 06/03/22  Single (S)/Dual (D)/BV (M) M Presenting APVP Pacer dependant: No. Underlying . AP  and VS @ 40.  AP 63%. BP 97.% AMS Episodes 11.  Brief AT. HVR 1. Longest 4Sec, Latest 10/18/21.  PVC burden 1.7%. Longevity ERI Years/Voltage.  Lead measurements: Stable Histogram: Low (L)/normal (N)/high (H)  Normal Patient activity Low. Thoracic impedance: Thoracic impedance suggest fluid overload state for the last 7 days.  Observations:  Normal BV ICD function, needs elective generator change.  Changes: None  EKG  03/14/22:AV paced rhythm, biventricular pacemaker detected.  No further analysis. No significant change from EKG 03/22/2020   Assessment     ICD-10-CM   1. Encounter for management of biventricular implantable cardioverter-defibrillator (ICD)  Z45.02     2. ICD Biventricular implantable cardioverter-defibrillator (ICD) in situ [employer] [phone] Dillon Bjork 11/26/2013  Z95.810 Ambulatory referral to Cardiac Electrophysiology    3. Chronic systolic heart failure (HCC)  V25.36 Ambulatory referral to Cardiac  Electrophysiology    4. Nonischemic cardiomyopathy (HCC)  I42.8     5. Implantable cardioverter-defibrillator (ICD) at end of device life  Z45.02 Ambulatory referral to Cardiac Electrophysiology  No orders of the defined types were placed in this encounter.  There are no discontinued medications.  Recommendations:   No orders of the defined types were placed in this encounter.   Linda Lowe  is a 71 y.o. 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   Patient was last seen by [place] in June 2023, and on July 2023 she went back to [place] and just returned back home, her ICD revealed ERI.  I will refer for EP for generator change.  She denies any chest pain or dyspnea but has 7/12.  She does have faint bibasilar crackles, thoracic impedance is above baseline suggestive of fluid overload state.  Patient admits to having eaten fast food last week, we discussed regarding low-salt diet.  She will start taking furosemide on a daily basis for the next 5 days.  Fortunately no florid heart failure and no PND or orthopnea or leg edema.  Diabetes continues to be uncontrolled.  Fortunately renal function is normal, lipids are also normal.  I would like to see her back in 3 months for follow-up.    Adrian Prows, PA-C 06/03/2022, 1:14 PM Office: [phone]

## 2022-06-03 NOTE — Patient Instructions (Signed)
Patient is showing early signs of fluid accumulation.  Please be watchful with regard to her diet. Your ICD/Pacemaker is working normally. You are showing signs of fluid retention (Congestive heart failure). Please be watchful of your diet.   For now I would like you to restart taking furosemide on a daily basis for the next 5 days.  If weight gain > 3 Lbs, take extra dose of water pill (diuretic) for 1-2 days and if not corrected or having symptoms of shortness of breath or leg swelling, please call to be seen.

## 2022-07-02 DIAGNOSIS — E1169 Type 2 diabetes mellitus with other specified complication: Secondary | ICD-10-CM | POA: Diagnosis not present

## 2022-07-02 DIAGNOSIS — E785 Hyperlipidemia, unspecified: Secondary | ICD-10-CM | POA: Diagnosis not present

## 2022-07-02 DIAGNOSIS — I10 Essential (primary) hypertension: Secondary | ICD-10-CM | POA: Diagnosis not present

## 2022-07-04 DIAGNOSIS — M5412 Radiculopathy, cervical region: Secondary | ICD-10-CM | POA: Diagnosis not present

## 2022-07-04 DIAGNOSIS — E1169 Type 2 diabetes mellitus with other specified complication: Secondary | ICD-10-CM | POA: Diagnosis not present

## 2022-07-04 DIAGNOSIS — E782 Mixed hyperlipidemia: Secondary | ICD-10-CM | POA: Diagnosis not present

## 2022-07-04 DIAGNOSIS — I119 Hypertensive heart disease without heart failure: Secondary | ICD-10-CM | POA: Diagnosis not present

## 2022-07-09 ENCOUNTER — Encounter: Payer: Self-pay | Admitting: Internal Medicine

## 2022-07-09 ENCOUNTER — Ambulatory Visit: Payer: Medicare Other | Attending: Internal Medicine | Admitting: Internal Medicine

## 2022-07-09 VITALS — BP 112/70 | HR 63 | Ht 65.0 in

## 2022-07-09 DIAGNOSIS — I428 Other cardiomyopathies: Secondary | ICD-10-CM | POA: Diagnosis not present

## 2022-07-09 DIAGNOSIS — I447 Left bundle-branch block, unspecified: Secondary | ICD-10-CM

## 2022-07-09 DIAGNOSIS — Z95810 Presence of automatic (implantable) cardiac defibrillator: Secondary | ICD-10-CM

## 2022-07-09 DIAGNOSIS — Z01812 Encounter for preprocedural laboratory examination: Secondary | ICD-10-CM | POA: Diagnosis not present

## 2022-07-09 DIAGNOSIS — I5022 Chronic systolic (congestive) heart failure: Secondary | ICD-10-CM

## 2022-07-09 LAB — BASIC METABOLIC PANEL
BUN/Creatinine Ratio: 56 — ABNORMAL HIGH (ref 12–28)
BUN: 55 mg/dL — ABNORMAL HIGH (ref 8–27)
CO2: 22 mmol/L (ref 20–29)
Calcium: 8.9 mg/dL (ref 8.7–10.3)
Chloride: 108 mmol/L — ABNORMAL HIGH (ref 96–106)
Creatinine, Ser: 0.98 mg/dL (ref 0.57–1.00)
Glucose: 248 mg/dL — ABNORMAL HIGH (ref 70–99)
Potassium: 5 mmol/L (ref 3.5–5.2)
Sodium: 138 mmol/L (ref 134–144)
eGFR: 62 mL/min/{1.73_m2} (ref >59)

## 2022-07-09 LAB — CUP PACEART INCLINIC DEVICE CHECK
Battery Remaining Longevity: 0 mo
Brady Statistic RA Percent Paced: 59 %
Brady Statistic RV Percent Paced: 88 %
Date Time Interrogation Session: 20231024134114
HighPow Impedance: 58.5 Ohm
Implantable Lead Connection Status: 753985
Implantable Lead Connection Status: 753985
Implantable Lead Connection Status: 753985
Implantable Lead Implant Date: 20150318
Implantable Lead Implant Date: 20150318
Implantable Lead Implant Date: 20150318
Implantable Lead Location: 753858
Implantable Lead Location: 753859
Implantable Lead Location: 753860
Implantable Lead Model: 4398
Implantable Pulse Generator Implant Date: 20150318
Lead Channel Impedance Value: 387.5 Ohm
Lead Channel Impedance Value: 450 Ohm
Lead Channel Impedance Value: 837.5 Ohm
Lead Channel Pacing Threshold Amplitude: 0.375 V
Lead Channel Pacing Threshold Amplitude: 0.75 V
Lead Channel Pacing Threshold Amplitude: 1.125 V
Lead Channel Pacing Threshold Pulse Width: 0.5 ms
Lead Channel Pacing Threshold Pulse Width: 0.5 ms
Lead Channel Pacing Threshold Pulse Width: 1 ms
Lead Channel Sensing Intrinsic Amplitude: 0.8 mV
Lead Channel Sensing Intrinsic Amplitude: 11.8 mV
Lead Channel Setting Pacing Amplitude: 1.75 V
Lead Channel Setting Pacing Amplitude: 2 V
Lead Channel Setting Pacing Amplitude: 2.125
Lead Channel Setting Pacing Pulse Width: 0.5 ms
Lead Channel Setting Pacing Pulse Width: 1 ms
Lead Channel Setting Sensing Sensitivity: 0.5 mV
Pulse Gen Serial Number: 7155584

## 2022-07-09 LAB — CBC
Hematocrit: 38.2 % (ref 34.0–46.6)
Hemoglobin: 12.5 g/dL (ref 11.1–15.9)
MCH: 29.5 pg (ref 26.6–33.0)
MCHC: 32.7 g/dL (ref 31.5–35.7)
MCV: 90 fL (ref 79–97)
Platelets: 194 10*3/uL (ref 150–450)
RBC: 4.24 x10E6/uL (ref 3.77–5.28)
RDW: 14.7 % (ref 11.7–15.4)
WBC: 9.5 10*3/uL (ref 3.4–10.8)

## 2022-07-09 NOTE — Patient Instructions (Addendum)
Medication Instructions:  Your physician recommends that you continue on your current medications as directed. Please refer to the Current Medication list given to you today.  Take Lasix 40mg  - 1 tablet by mouth daily x 4 days then return to as needed dosing.  *If you need a refill on your cardiac medications before your next appointment, please call your pharmacy*   Lab Work: CBC and BMET If you have labs (blood work) drawn today and your tests are completely normal, you will receive your results only by: [place] (if you have MyChart) OR A paper copy in the mail If you have any lab test that is abnormal or we need to change your treatment, we will call you to review the results.   Testing/Procedures: None ordered.    Follow-Up: At [facility], you and your health needs are our priority.  As part of our continuing mission to provide you with exceptional heart care, we have created designated Provider Care Teams.  These Care Teams include your primary Cardiologist (physician) and Advanced Practice Providers (APPs -  Physician Assistants and Nurse Practitioners) who all work together to provide you with the care you need, when you need it.  We recommend signing up for the patient portal called "MyChart".  Sign up information is provided on this After Visit Summary.  MyChart is used to connect with patients for Virtual Visits (Telemedicine).  Patients are able to view lab/test results, encounter notes, upcoming appointments, etc.  Non-urgent messages can be sent to your provider as well.   To learn more about what you can do with MyChart, go to [email or web address].    Your next appointment:   To be scheduled  Important Information About Sugar

## 2022-07-09 NOTE — Progress Notes (Signed)
Patient Care Team: Lucianne Lei, MD as PCP - General (Family Medicine) Woody Seller Sylvan Cheese, MD as Referring Physician (Cardiology) Lucianne Lei, MD (Family Medicine)   HPI  Linda Lowe is a 71 y.o. female Seen in followup for CRT-D implanted 2014 in the setting of nonischemic cardiomyopathy.  She had been gone to the [place], intercurrent stroke, followed by Dr. Jillyn Hidden.  Device reached ERI 1/23.  Functional status remains stable with limited ambulation because of her stroke 3 pillow orthopnea and some nocturnal dyspnea.  No peripheral edema but abdominal swelling No lightheadedness DATE TEST EF   12`/14 Echo   30-35 %   10/22 Echo  60-65%         Date Cr K Hgb  8/22   11.9<<8.8  7/23 0.79 4.6              Past Medical History:  Diagnosis Date   Afib ([place])    AICD (automatic cardioverter/defibrillator) present    St Jude    Arthritis    Cardiomyopathy ([place])    a. 05/2012 Echo: EF 30-35%, Gr 1 DD, mild LVH, mild MR, pericardial effusion   Chronic systolic heart failure ([place]) 08/26/2013   Diabetes mellitus    a. Dx > 5 y ago   Dyspnea    Dysrhythmia    Hypercholesteremia    Hypertension    ICD Biventricular implantable cardioverter-defibrillator (ICD) in situ St[employer] Jude Medical [id number] Quadra Assura 11/26/2013    St Jude    Pericardial effusion    a. 05/2012 Echo: circumferential pericardial effusion, mostly 47mm, largest pocket posterior - 20mm, mild RA indentation, no RV collapse, no tamponade.   Stroke ([place]) 04/29/2018   Right hemiparesis in [place]   Stroke [facility])     Past Surgical History:  Procedure Laterality Date   ABDOMINAL HYSTERECTOMY     BI-VENTRICULAR IMPLANTABLE CARDIOVERTER DEFIBRILLATOR N/A 12/01/2013   St. Jude BI-VENTRICULAR IMPLANTABLE CARDIOVERTER DEFIBRILLATOR  (CRT-D);  Surgeon: Deboraha Sprang, MD;    BIOPSY  05/06/2021   Procedure: BIOPSY;  Surgeon: Daryel November, MD;  Location: [place];  Service: Gastroenterology;;    COLONOSCOPY WITH PROPOFOL Left 05/06/2021   Procedure: COLONOSCOPY WITH PROPOFOL;  Surgeon: Daryel November, MD;  Location: [place];  Service: Gastroenterology;  Laterality: Left;   ESOPHAGOGASTRODUODENOSCOPY (EGD) WITH PROPOFOL Left 05/06/2021   Procedure: ESOPHAGOGASTRODUODENOSCOPY (EGD) WITH PROPOFOL;  Surgeon: Daryel November, MD;  Location: [place];  Service: Gastroenterology;  Laterality: Left;   FOOT SURGERY     LEFT AND RIGHT HEART CATHETERIZATION WITH CORONARY ANGIOGRAM  05/26/2012   Procedure: LEFT AND RIGHT HEART CATHETERIZATION WITH CORONARY ANGIOGRAM;  Surgeon: Sherren Mocha, MD;  Location: [facility] CATH LAB;  Service: Cardiovascular;;   ORIF PATELLA Right 07/08/2017   Procedure: OPEN REDUCTION INTERNAL (ORIF) FIXATION PATELLA;  Surgeon: Renette Butters, MD;  Location: [place];  Service: Orthopedics;  Laterality: Right;    Current Outpatient Medications  Medication Sig Dispense Refill   atorvastatin (LIPITOR) 80 MG tablet TAKE 1 TABLET(80 MG) BY MOUTH DAILY 90 tablet 2   B-D UF III MINI PEN NEEDLES 31G X 5 MM MISC USE 5 NEEDLES EVERY DAY AS DIRECTED 200 each 0   carvedilol (COREG) 25 MG tablet Take 25 mg by mouth 2 (two) times daily.  4   Continuous Blood Gluc Receiver (FREESTYLE LIBRE 2 READER) DEVI      Continuous Blood Gluc Sensor (FREESTYLE LIBRE 2 SENSOR) MISC 4 (four) times daily.  ferrous sulfate 325 (65 FE) MG tablet Take 1 tablet (325 mg total) by mouth 2 (two) times daily with a meal. 60 tablet 1   furosemide (LASIX) 40 MG tablet Take 1 tablet (40 mg total) by mouth daily as needed for fluid. 30 tablet 3   hydrALAZINE (APRESOLINE) 25 MG tablet Take 1 tablet by mouth daily.     insulin degludec (TRESIBA FLEXTOUCH) 100 UNIT/ML FlexTouch Pen Inject 36 Units into the skin daily.     Insulin Pen Needle (B-D UF III MINI PEN NEEDLES) 31G X 5 MM MISC USE 5 PEN NEEDLES PER DAY AS INSTRUCTED 150 each 10   Insulin Pen Needle (B-D UF III MINI PEN NEEDLES) 31G X 5 MM  MISC USE 5 NEEDLES EVERY DAY AS DIRECTED 200 each 0   irbesartan (AVAPRO) 300 MG tablet TAKE 1 TABLET(300 MG) BY MOUTH DAILY 90 tablet 0   isosorbide dinitrate (ISORDIL) 30 MG tablet Take 1 tablet by mouth in the morning, at noon, and at bedtime.     lidocaine (LIDODERM) 5 % 1 patch daily.     NOVOLOG FLEXPEN 100 UNIT/ML FlexPen ADMINISTER 16 UNITS UNDER THE SKIN TWICE DAILY BEFORE LUNCH AND SUPPER (Patient taking differently: Inject 3-15 Units into the skin 2 (two) times daily as needed for high blood sugar.) 15 mL 0   OZEMPIC, 0.25 OR 0.5 MG/DOSE, 2 MG/3ML SOPN Inject 0.5 mg into the skin once a week.     potassium chloride SA (KLOR-CON M) 20 MEQ tablet Take 1 tablet (20 mEq total) by mouth daily. 30 tablet 1   predniSONE (STERAPRED UNI-PAK 21 TAB) 10 MG (21) TBPK tablet Take by mouth.     No current facility-administered medications for this visit.    No Known Allergies  Review of Systems negative except from HPI and PMH  Physical Exam BP 112/70   Pulse 63   Ht 5\' 5"  (1.651 m)   SpO2 93%   BMI 23.46 kg/m  Well developed and well nourished in no acute distress HENT normal Neck supple with JVP-flat Clear Device pocket well healed; without hematoma or erythema.  There is no tethering  Regular rate and rhythm, no   murmur Abd-soft with active BS No Clubbing cyanosis   edema Skin-warm and dry A & Oriented  Grossly normal sensory and motor function  R hemiparesis   ECG sinus with P-synchronous/ AV  pacing Upright QRS lead V1 and neg lead 1       Assessment and  Plan  Nonischemic cardiomyopathy  Congestive heart failure-chronic-diastolic  Defibrillator-CRT-St. Jude      Stroke intercurrent  Anemia  Hypertension  The patient has been lost to follow-up by [place].  Seen in the [place] and followed elsewhere.  She is at ERI having arrived at this 1/23.  Thankfully, she has routine CRT pacing based on her ECG and has had normalization of her LV function as assessed 2022.   I suspect then her heart failure is HFpEF and volume sensitive.  She has abdominal distention, we will increase her furosemide on a 4-day trial of 40 mg daily and will reassess functional status on Monday.  We will undertake ICD generator replacement  We have reviewed the benefits and risks of generator replacement.  These include but are not limited to lead fracture and infection.  The patient understands, agrees and is willing to proceed.     There is no evidence on device interrogation of interval atrial fibrillation.  She has been anemic, we  will recheck her hemoglobin as part of her preoperative laboratories  Well-controlled.  We will continue on Apresoline although at 25 mg daily we will discuss with Dr. Einar Gip about discontinuing this, carvedilol 25 twice daily Avapro we will decrease to 150 mg a day.

## 2022-07-09 NOTE — H&P (View-Only) (Signed)
Patient Care Team: Lucianne Lei, MD as PCP - General (Family Medicine) Woody Seller Sylvan Cheese, MD as Referring Physician (Cardiology) Lucianne Lei, MD (Family Medicine)   HPI  Linda Lowe is a 71 y.o. female Seen in followup for CRT-D implanted 2014 in the setting of nonischemic cardiomyopathy.  She had been gone to the [place], intercurrent stroke, followed by Dr. Jillyn Hidden.  Device reached ERI 1/23.  Functional status remains stable with limited ambulation because of her stroke 3 pillow orthopnea and some nocturnal dyspnea.  No peripheral edema but abdominal swelling No lightheadedness DATE TEST EF   12`/14 Echo   30-35 %   10/22 Echo  60-65%         Date Cr K Hgb  8/22   11.9<<8.8  7/23 0.79 4.6              Past Medical History:  Diagnosis Date   Afib ([place])    AICD (automatic cardioverter/defibrillator) present    St Jude    Arthritis    Cardiomyopathy ([place])    a. 05/2012 Echo: EF 30-35%, Gr 1 DD, mild LVH, mild MR, pericardial effusion   Chronic systolic heart failure ([place]) 08/26/2013   Diabetes mellitus    a. Dx > 5 y ago   Dyspnea    Dysrhythmia    Hypercholesteremia    Hypertension    ICD Biventricular implantable cardioverter-defibrillator (ICD) in situ St. Jude Medical [id number] Quadra Assura 11/26/2013    St Jude    Pericardial effusion    a. 05/2012 Echo: circumferential pericardial effusion, mostly 47mm, largest pocket posterior - 20mm, mild RA indentation, no RV collapse, no tamponade.   Stroke ([place]) 04/29/2018   Right hemiparesis in [place]   Stroke [facility])     Past Surgical History:  Procedure Laterality Date   ABDOMINAL HYSTERECTOMY     BI-VENTRICULAR IMPLANTABLE CARDIOVERTER DEFIBRILLATOR N/A 12/01/2013   St. Jude BI-VENTRICULAR IMPLANTABLE CARDIOVERTER DEFIBRILLATOR  (CRT-D);  Surgeon: Deboraha Sprang, MD;    BIOPSY  05/06/2021   Procedure: BIOPSY;  Surgeon: Daryel November, MD;  Location: [place];  Service: Gastroenterology;;    COLONOSCOPY WITH PROPOFOL Left 05/06/2021   Procedure: COLONOSCOPY WITH PROPOFOL;  Surgeon: Daryel November, MD;  Location: [place];  Service: Gastroenterology;  Laterality: Left;   ESOPHAGOGASTRODUODENOSCOPY (EGD) WITH PROPOFOL Left 05/06/2021   Procedure: ESOPHAGOGASTRODUODENOSCOPY (EGD) WITH PROPOFOL;  Surgeon: Daryel November, MD;  Location: [place];  Service: Gastroenterology;  Laterality: Left;   FOOT SURGERY     LEFT AND RIGHT HEART CATHETERIZATION WITH CORONARY ANGIOGRAM  05/26/2012   Procedure: LEFT AND RIGHT HEART CATHETERIZATION WITH CORONARY ANGIOGRAM;  Surgeon: Sherren Mocha, MD;  Location: [facility] CATH LAB;  Service: Cardiovascular;;   ORIF PATELLA Right 07/08/2017   Procedure: OPEN REDUCTION INTERNAL (ORIF) FIXATION PATELLA;  Surgeon: Renette Butters, MD;  Location: [place];  Service: Orthopedics;  Laterality: Right;    Current Outpatient Medications  Medication Sig Dispense Refill   atorvastatin (LIPITOR) 80 MG tablet TAKE 1 TABLET(80 MG) BY MOUTH DAILY 90 tablet 2   B-D UF III MINI PEN NEEDLES 31G X 5 MM MISC USE 5 NEEDLES EVERY DAY AS DIRECTED 200 each 0   carvedilol (COREG) 25 MG tablet Take 25 mg by mouth 2 (two) times daily.  4   Continuous Blood Gluc Receiver (FREESTYLE LIBRE 2 READER) DEVI      Continuous Blood Gluc Sensor (FREESTYLE LIBRE 2 SENSOR) MISC 4 (four) times daily.  ferrous sulfate 325 (65 FE) MG tablet Take 1 tablet (325 mg total) by mouth 2 (two) times daily with a meal. 60 tablet 1   furosemide (LASIX) 40 MG tablet Take 1 tablet (40 mg total) by mouth daily as needed for fluid. 30 tablet 3   hydrALAZINE (APRESOLINE) 25 MG tablet Take 1 tablet by mouth daily.     insulin degludec (TRESIBA FLEXTOUCH) 100 UNIT/ML FlexTouch Pen Inject 36 Units into the skin daily.     Insulin Pen Needle (B-D UF III MINI PEN NEEDLES) 31G X 5 MM MISC USE 5 PEN NEEDLES PER DAY AS INSTRUCTED 150 each 10   Insulin Pen Needle (B-D UF III MINI PEN NEEDLES) 31G X 5 MM  MISC USE 5 NEEDLES EVERY DAY AS DIRECTED 200 each 0   irbesartan (AVAPRO) 300 MG tablet TAKE 1 TABLET(300 MG) BY MOUTH DAILY 90 tablet 0   isosorbide dinitrate (ISORDIL) 30 MG tablet Take 1 tablet by mouth in the morning, at noon, and at bedtime.     lidocaine (LIDODERM) 5 % 1 patch daily.     NOVOLOG FLEXPEN 100 UNIT/ML FlexPen ADMINISTER 16 UNITS UNDER THE SKIN TWICE DAILY BEFORE LUNCH AND SUPPER (Patient taking differently: Inject 3-15 Units into the skin 2 (two) times daily as needed for high blood sugar.) 15 mL 0   OZEMPIC, 0.25 OR 0.5 MG/DOSE, 2 MG/3ML SOPN Inject 0.5 mg into the skin once a week.     potassium chloride SA (KLOR-CON M) 20 MEQ tablet Take 1 tablet (20 mEq total) by mouth daily. 30 tablet 1   predniSONE (STERAPRED UNI-PAK 21 TAB) 10 MG (21) TBPK tablet Take by mouth.     No current facility-administered medications for this visit.    No Known Allergies  Review of Systems negative except from HPI and PMH  Physical Exam BP 112/70   Pulse 63   Ht 5\' 5"  (1.651 m)   SpO2 93%   BMI 23.46 kg/m  Well developed and well nourished in no acute distress HENT normal Neck supple with JVP-flat Clear Device pocket well healed; without hematoma or erythema.  There is no tethering  Regular rate and rhythm, no   murmur Abd-soft with active BS No Clubbing cyanosis   edema Skin-warm and dry A & Oriented  Grossly normal sensory and motor function  R hemiparesis   ECG sinus with P-synchronous/ AV  pacing Upright QRS lead V1 and neg lead 1       Assessment and  Plan  Nonischemic cardiomyopathy  Congestive heart failure-chronic-diastolic  Defibrillator-CRT-St. Jude      Stroke intercurrent  Anemia  Hypertension  The patient has been lost to follow-up by [place].  Seen in the [place] and followed elsewhere.  She is at ERI having arrived at this 1/23.  Thankfully, she has routine CRT pacing based on her ECG and has had normalization of her LV function as assessed 2022.   I suspect then her heart failure is HFpEF and volume sensitive.  She has abdominal distention, we will increase her furosemide on a 4-day trial of 40 mg daily and will reassess functional status on Monday.  We will undertake ICD generator replacement  We have reviewed the benefits and risks of generator replacement.  These include but are not limited to lead fracture and infection.  The patient understands, agrees and is willing to proceed.     There is no evidence on device interrogation of interval atrial fibrillation.  She has been anemic, we  will recheck her hemoglobin as part of her preoperative laboratories  Well-controlled.  We will continue on Apresoline although at 25 mg daily we will discuss with Dr. Einar Gip about discontinuing this, carvedilol 25 twice daily Avapro we will decrease to 150 mg a day.

## 2022-07-10 ENCOUNTER — Other Ambulatory Visit: Payer: Self-pay | Admitting: Family Medicine

## 2022-07-10 ENCOUNTER — Ambulatory Visit
Admission: RE | Admit: 2022-07-10 | Discharge: 2022-07-10 | Disposition: A | Discharge status: Home / Self Care | Payer: Medicare Other | Source: Ambulatory Visit | Attending: Family Medicine | Admitting: Family Medicine

## 2022-07-10 DIAGNOSIS — M25511 Pain in right shoulder: Secondary | ICD-10-CM

## 2022-07-12 NOTE — Pre-Procedure Instructions (Signed)
Attempted to call patient regarding procedure.  Left voice mail on the following items: Arrival time 1000 Nothing to eat or drink after midnight No meds AM of procedure Responsible person to drive you home and stay with you for 24 hrs Wash with special soap night before and morning of procedure

## 2022-07-15 ENCOUNTER — Ambulatory Visit (HOSPITAL_COMMUNITY)
Admission: RE | Admit: 2022-07-15 | Discharge: 2022-07-15 | Disposition: A | Discharge status: Home / Self Care | Payer: Medicare Other | Source: Ambulatory Visit | Attending: Internal Medicine | Admitting: Internal Medicine

## 2022-07-15 ENCOUNTER — Encounter (HOSPITAL_COMMUNITY)
Admission: RE | Disposition: A | Discharge status: Home / Self Care | Payer: Self-pay | Source: Ambulatory Visit | Attending: Internal Medicine

## 2022-07-15 ENCOUNTER — Other Ambulatory Visit: Payer: Self-pay

## 2022-07-15 DIAGNOSIS — I428 Other cardiomyopathies: Secondary | ICD-10-CM | POA: Insufficient documentation

## 2022-07-15 DIAGNOSIS — I110 Hypertensive heart disease with heart failure: Secondary | ICD-10-CM | POA: Insufficient documentation

## 2022-07-15 DIAGNOSIS — Z95810 Presence of automatic (implantable) cardiac defibrillator: Secondary | ICD-10-CM

## 2022-07-15 DIAGNOSIS — I442 Atrioventricular block, complete: Secondary | ICD-10-CM | POA: Diagnosis not present

## 2022-07-15 DIAGNOSIS — Z4502 Encounter for adjustment and management of automatic implantable cardiac defibrillator: Secondary | ICD-10-CM | POA: Diagnosis not present

## 2022-07-15 DIAGNOSIS — D649 Anemia, unspecified: Secondary | ICD-10-CM | POA: Insufficient documentation

## 2022-07-15 DIAGNOSIS — I5032 Chronic diastolic (congestive) heart failure: Secondary | ICD-10-CM | POA: Diagnosis not present

## 2022-07-15 DIAGNOSIS — Z01812 Encounter for preprocedural laboratory examination: Secondary | ICD-10-CM

## 2022-07-15 DIAGNOSIS — Z8673 Personal history of transient ischemic attack (TIA), and cerebral infarction without residual deficits: Secondary | ICD-10-CM | POA: Diagnosis not present

## 2022-07-15 HISTORY — PX: BIV ICD GENERATOR CHANGEOUT: EP1194

## 2022-07-15 LAB — GLUCOSE, CAPILLARY
Glucose-Capillary: 94 mg/dL (ref 70–99)
Glucose-Capillary: 90 mg/dL (ref 70–99)

## 2022-07-15 SURGERY — BIV ICD GENERATOR CHANGEOUT
Anesthesia: LOCAL

## 2022-07-15 MED ORDER — SODIUM CHLORIDE 0.9 % IV SOLN: INTRAVENOUS | Status: DC

## 2022-07-15 MED ORDER — LIDOCAINE HCL (PF) 1 % IJ SOLN
INTRAMUSCULAR | Status: AC
  Filled 2022-07-15: qty 60

## 2022-07-15 MED ORDER — SODIUM CHLORIDE 0.9 % IV SOLN
INTRAVENOUS | Status: AC
  Filled 2022-07-15: qty 2

## 2022-07-15 MED ORDER — ACETAMINOPHEN 325 MG PO TABS: 325.0000 mg | ORAL_TABLET | ORAL | Status: DC | PRN

## 2022-07-15 MED ORDER — LIDOCAINE HCL (PF) 1 % IJ SOLN
INTRAMUSCULAR | Status: DC | PRN
  Administered 2022-07-15: 60 mL

## 2022-07-15 MED ORDER — CEFAZOLIN SODIUM-DEXTROSE 2-4 GM/100ML-% IV SOLN
2.0000 g | INTRAVENOUS | Status: AC
  Administered 2022-07-15: 2 g via INTRAVENOUS

## 2022-07-15 MED ORDER — CHLORHEXIDINE GLUCONATE 4 % EX LIQD
4.0000 | Freq: Once | CUTANEOUS | Status: DC
  Filled 2022-07-15: qty 60

## 2022-07-15 MED ORDER — ACETAMINOPHEN 500 MG PO TABS: 1000.0000 mg | ORAL_TABLET | Freq: Four times a day (QID) | ORAL | Status: DC | PRN

## 2022-07-15 MED ORDER — SODIUM CHLORIDE 0.9 % IV SOLN
80.0000 mg | INTRAVENOUS | Status: AC
  Administered 2022-07-15: 80 mg

## 2022-07-15 MED ORDER — DEXTROSE 50 % IV SOLN
INTRAVENOUS | Status: AC
  Filled 2022-07-15: qty 50

## 2022-07-15 MED ORDER — CEFAZOLIN SODIUM-DEXTROSE 2-4 GM/100ML-% IV SOLN
INTRAVENOUS | Status: AC
  Filled 2022-07-15: qty 100

## 2022-07-15 MED ORDER — ACETAMINOPHEN 500 MG PO TABS
ORAL_TABLET | ORAL | Status: AC
  Administered 2022-07-15: 1000 mg via ORAL
  Filled 2022-07-15: qty 2

## 2022-07-15 MED ORDER — SODIUM CHLORIDE 0.9 % IV SOLN: INTRAVENOUS | Status: AC

## 2022-07-15 SURGICAL SUPPLY — 7 items
CABLE SURGICAL S-101-97-12 (CABLE) IMPLANT
DEVICE DISSECT PLASMABLAD 3.0S (MISCELLANEOUS) IMPLANT
HEMOSTAT SURGICEL 2X4 FIBR (HEMOSTASIS) IMPLANT
ICD GALLANT HFCRTD CDHFA500Q (ICD Generator) IMPLANT
PAD DEFIB RADIO PHYSIO CONN (PAD) IMPLANT
PLASMABLADE 3.0S (MISCELLANEOUS) ×1
TRAY PACEMAKER INSERTION (PACKS) IMPLANT

## 2022-07-15 NOTE — Progress Notes (Signed)
Pt began vomiting upon arrival to unit rm 18, reporting nurses at bedside and MD noted. MD arrived at bedside and stated no further interventions needed other than checking pts BG level. Will continue to monitor.

## 2022-07-15 NOTE — Progress Notes (Signed)
Pt and spouse received d/c instructions written and verbal. All questions and concerns addressed. W/c returned to pt along w/ all belongings.

## 2022-07-15 NOTE — Interval H&P Note (Signed)
History and Physical Interval Note:  07/15/2022 11:15 AM  Linda Lowe  has presented today for surgery, with the diagnosis of ERI.  The various methods of treatment have been discussed with the patient and family. After consideration of risks, benefits and other options for treatment, the patient has consented to  Procedure(s): BIV ICD [place] (N/A) as a surgical intervention.  The patient's history has been reviewed, patient examined, no change in status, stable for surgery.  I have reviewed the patient's chart and labs.  Questions were answered to the patient's satisfaction.    Pt complaining of shoulder pain which she and her husband both acknowledge as old and familiar.  Also however complaining of R leg numbness, which she says is new upon arrival. When I sat her up it abated,  prior to that I had called code stroke for the new onset numbness.  Will proceed with generator replacement following neuro assessment    Virl Axe

## 2022-07-15 NOTE — Code Documentation (Addendum)
Stroke Response Nurse Documentation Code Documentation  Linda Lowe is a 71 y.o. female admitted to [facility]  on 07/15/22 for ICD generator changeout with past medical hx of afib, HTN, HLD, AICD, stroke. On No antithrombotic. Code stroke was activated by shortstay .   Patient in post op short stay and c/o right thigh tingling, code stroke activated. Tingling subsided quickly after position change and code stroke cancelled by Olin Pia MD as RRT arriving.   NIHSS 5- patient scoring on baseline deficits from prior strokes.   Candace Cruise K  Stroke Response RN

## 2022-07-16 ENCOUNTER — Encounter (HOSPITAL_COMMUNITY): Payer: Self-pay | Admitting: Internal Medicine

## 2022-07-24 ENCOUNTER — Telehealth: Payer: Self-pay

## 2022-07-24 NOTE — Telephone Encounter (Signed)
Spoke with pt and advised of lab results and recommendations per Dr Graciela Husbands.  Pt verbalizes understanding and states she will follow up with her PCP.

## 2022-07-24 NOTE — Telephone Encounter (Signed)
-----   Message from Duke Salvia, MD sent at 07/14/2022  3:55 PM EDT ----- Please Inform Patient  Labs are normal x increasing BUN-- she should followup with her PCP  Thanks

## 2022-07-25 ENCOUNTER — Telehealth: Payer: Self-pay | Admitting: Cardiology

## 2022-07-25 ENCOUNTER — Ambulatory Visit: Payer: Medicare Other | Attending: Internal Medicine

## 2022-07-25 DIAGNOSIS — I447 Left bundle-branch block, unspecified: Secondary | ICD-10-CM

## 2022-07-25 DIAGNOSIS — I428 Other cardiomyopathies: Secondary | ICD-10-CM | POA: Diagnosis not present

## 2022-07-25 DIAGNOSIS — Z95810 Presence of automatic (implantable) cardiac defibrillator: Secondary | ICD-10-CM

## 2022-07-25 LAB — CUP PACEART INCLINIC DEVICE CHECK
Battery Remaining Longevity: 76 mo
Brady Statistic RA Percent Paced: 87 %
Brady Statistic RV Percent Paced: 96 %
Date Time Interrogation Session: 20231109102243
HighPow Impedance: 55.125
Implantable Lead Connection Status: 753985
Implantable Lead Connection Status: 753985
Implantable Lead Connection Status: 753985
Implantable Lead Implant Date: 20150318
Implantable Lead Implant Date: 20150318
Implantable Lead Implant Date: 20150318
Implantable Lead Location: 753858
Implantable Lead Location: 753859
Implantable Lead Location: 753860
Implantable Lead Model: 4398
Implantable Pulse Generator Implant Date: 20231030
Lead Channel Impedance Value: 375 Ohm
Lead Channel Impedance Value: 462.5 Ohm
Lead Channel Impedance Value: 762.5 Ohm
Lead Channel Pacing Threshold Amplitude: 0.25 V
Lead Channel Pacing Threshold Amplitude: 0.25 V
Lead Channel Pacing Threshold Amplitude: 1 V
Lead Channel Pacing Threshold Amplitude: 1 V
Lead Channel Pacing Threshold Amplitude: 1.25 V
Lead Channel Pacing Threshold Amplitude: 1.25 V
Lead Channel Pacing Threshold Pulse Width: 0.5 ms
Lead Channel Pacing Threshold Pulse Width: 0.5 ms
Lead Channel Pacing Threshold Pulse Width: 0.5 ms
Lead Channel Pacing Threshold Pulse Width: 0.5 ms
Lead Channel Pacing Threshold Pulse Width: 1 ms
Lead Channel Pacing Threshold Pulse Width: 1 ms
Lead Channel Sensing Intrinsic Amplitude: 0.5 mV
Lead Channel Sensing Intrinsic Amplitude: 9.8 mV
Lead Channel Setting Pacing Amplitude: 1.625
Lead Channel Setting Pacing Amplitude: 1.75 V
Lead Channel Setting Pacing Amplitude: 1.75 V
Lead Channel Setting Pacing Pulse Width: 0.5 ms
Lead Channel Setting Pacing Pulse Width: 1 ms
Lead Channel Setting Sensing Sensitivity: 0.3 mV
Pulse Gen Serial Number: 810052148

## 2022-07-25 NOTE — Telephone Encounter (Signed)
Patient had allergic transmission, 07/24/2022 revealing RA lead threshold out of range.  Patient not in atrial fibrillation.  Will need pacemaker/device check at the earliest

## 2022-07-25 NOTE — Patient Instructions (Signed)
   After Your ICD (Implantable Cardiac Defibrillator)    Monitor your defibrillator site for redness, swelling, and drainage. Call the device clinic at [phone] if you experience these symptoms or fever/chills.  Your incision was closed with Dermabond:  You may shower 1 day after your defibrillator implant and wash your incision with soap and water. Avoid lotions, ointments, or perfumes over your incision until it is well-healed.  You may use a hot tub or a pool after your wound check appointment if the incision is completely closed.  Do not lift, push or pull greater than 10 pounds with the affected arm until 6 weeks after your procedure. There are no other restrictions in arm movement after your wound check appointment.   Your ICD is designed to protect you from life threatening heart rhythms. Because of this, you may receive a shock.   1 shock with no symptoms:  Call the office during business hours. 1 shock with symptoms (chest pain, chest pressure, dizziness, lightheadedness, shortness of breath, overall feeling unwell):  Call 911. If you experience 2 or more shocks in 24 hours:  Call 911. If you receive a shock, you should not drive.  [place] DMV - no driving for 6 months if you receive appropriate therapy from your ICD.   ICD Alerts:  Some alerts are vibratory and others beep. These are NOT emergencies. Please call our office to let us know. If this occurs at night or on weekends, it can wait until the next business day. Send a remote transmission.  If your device is capable of reading fluid status (for heart failure), you will be offered monthly monitoring to review this with you.

## 2022-07-25 NOTE — Telephone Encounter (Signed)
Spoke with patients husband and he will bring her in on Nov 15th @3 :30 (Pacemaker Day). He said she recently had a new battery replaced in her device.

## 2022-07-25 NOTE — Telephone Encounter (Signed)
She sent a transmission today on the 9th, Normal threshold and impedance of both A and LV/RV leads. Normal function. Hence no urgency. Yesterday I received an alert stating A lead threshold out of range.

## 2022-07-25 NOTE — Progress Notes (Signed)
Wound check appointment. Dermabond removed. Wound without redness or edema. Incision edges approximated, wound well healed. Normal device function. Thresholds, sensing, and impedances consistent with implant measurements. Device programmed at chronic settings. Histogram distribution appropriate for patient and level of activity. No mode switches or ventricular arrhythmias noted. Patient educated about wound care, arm mobility, lifting restrictions, shock plan. ROV in 3 months with implanting physician. Gen change. Unable to attach documentation d/t USB drive error.

## 2022-07-30 NOTE — Progress Notes (Unsigned)
Chief Complaint  Patient presents with   ICD check   Encounter for management of biventricular implantable cardioverter-defibrillator (ICD)  ICD Biventricular implantable cardioverter-defibrillator (ICD) in situ St. Jude [employer] [id number] Quadra Assura 11/26/2013  Nonischemic cardiomyopathy (HCC)  Scheduled  In office ICD 07/30/22  Single (S)/Dual (D)/BV (M) *** Presenting *** Pacer dependant: ***. Underlying ***. AP ***%, VP ***%. BP ***.% AMS Episodes ***.  AT/AF burden ***%. Longest ***. Latest***. HVR ***. Longest ***, Latest ***.  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  Lead measurements: Stable Histogram: Low (L)/normal (N)/high (H)  *** Patient activity ***. Thoracic impedance: ***  Observations: ***.  Changes: ***

## 2022-07-31 ENCOUNTER — Ambulatory Visit: Payer: Medicare Other | Admitting: Cardiology

## 2022-07-31 DIAGNOSIS — Z4502 Encounter for adjustment and management of automatic implantable cardiac defibrillator: Secondary | ICD-10-CM

## 2022-07-31 DIAGNOSIS — Z95810 Presence of automatic (implantable) cardiac defibrillator: Secondary | ICD-10-CM

## 2022-07-31 DIAGNOSIS — I428 Other cardiomyopathies: Secondary | ICD-10-CM | POA: Diagnosis not present

## 2022-08-06 DIAGNOSIS — E1169 Type 2 diabetes mellitus with other specified complication: Secondary | ICD-10-CM | POA: Diagnosis not present

## 2022-08-06 DIAGNOSIS — E782 Mixed hyperlipidemia: Secondary | ICD-10-CM | POA: Diagnosis not present

## 2022-08-06 DIAGNOSIS — E038 Other specified hypothyroidism: Secondary | ICD-10-CM | POA: Diagnosis not present

## 2022-08-06 DIAGNOSIS — I10 Essential (primary) hypertension: Secondary | ICD-10-CM | POA: Diagnosis not present

## 2022-08-06 DIAGNOSIS — I69353 Hemiplegia and hemiparesis following cerebral infarction affecting right non-dominant side: Secondary | ICD-10-CM | POA: Diagnosis not present

## 2022-08-06 NOTE — Telephone Encounter (Signed)
Appointment : 07/31/2022 completed

## 2022-08-07 ENCOUNTER — Telehealth: Payer: Self-pay

## 2022-08-07 DIAGNOSIS — E7800 Pure hypercholesterolemia, unspecified: Secondary | ICD-10-CM

## 2022-08-07 MED ORDER — ATORVASTATIN CALCIUM 80 MG PO TABS: ORAL_TABLET | ORAL | 2 refills | Status: DC

## 2022-08-07 NOTE — Telephone Encounter (Signed)
Sent Atorvastatin in to pharmacy per request of patient husband.

## 2022-08-12 ENCOUNTER — Other Ambulatory Visit: Payer: Self-pay | Admitting: Cardiology

## 2022-08-14 DIAGNOSIS — I5022 Chronic systolic (congestive) heart failure: Secondary | ICD-10-CM | POA: Diagnosis not present

## 2022-08-14 DIAGNOSIS — Z4502 Encounter for adjustment and management of automatic implantable cardiac defibrillator: Secondary | ICD-10-CM | POA: Diagnosis not present

## 2022-08-14 DIAGNOSIS — Z95810 Presence of automatic (implantable) cardiac defibrillator: Secondary | ICD-10-CM | POA: Diagnosis not present

## 2022-08-15 DIAGNOSIS — E782 Mixed hyperlipidemia: Secondary | ICD-10-CM | POA: Diagnosis not present

## 2022-08-15 DIAGNOSIS — E1169 Type 2 diabetes mellitus with other specified complication: Secondary | ICD-10-CM | POA: Diagnosis not present

## 2022-08-22 ENCOUNTER — Encounter (HOSPITAL_COMMUNITY): Payer: Self-pay | Admitting: Gastroenterology

## 2022-08-23 NOTE — Progress Notes (Signed)
Attempted to obtain medical history via telephone, unable to reach at this time. HIPAA compliant voicemail message left requesting return call to pre surgical testing department. 

## 2022-08-28 ENCOUNTER — Encounter: Payer: Self-pay | Admitting: Cardiology

## 2022-08-28 ENCOUNTER — Ambulatory Visit: Payer: Medicare Other | Admitting: Cardiology

## 2022-08-28 VITALS — BP 124/68 | HR 72 | Ht 64.5 in | Wt 144.0 lb

## 2022-08-28 DIAGNOSIS — I5042 Chronic combined systolic (congestive) and diastolic (congestive) heart failure: Secondary | ICD-10-CM | POA: Diagnosis not present

## 2022-08-28 DIAGNOSIS — E119 Type 2 diabetes mellitus without complications: Secondary | ICD-10-CM

## 2022-08-28 DIAGNOSIS — I428 Other cardiomyopathies: Secondary | ICD-10-CM

## 2022-08-28 DIAGNOSIS — Z95810 Presence of automatic (implantable) cardiac defibrillator: Secondary | ICD-10-CM

## 2022-08-28 DIAGNOSIS — G8191 Hemiplegia, unspecified affecting right dominant side: Secondary | ICD-10-CM

## 2022-08-28 NOTE — Progress Notes (Signed)
Primary Physician/Referring:  Renaye Rakers, MD  Patient ID: [name], female    DOB: Nov 11, 1950, 71 y.o.   MRN: [id number]  Chief Complaint  Patient presents with   Hypertension   Follow-up   Cardiomyopathy   HPI:    ADAIR LEMAR  is a 71 y.o. 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  Had BiV ICD generator change on 07/15/2022.  She presents for 86-month office visit, presently doing well and remains asymptomatic.  Continues to have difficulty with any kind of physical activity due to right hemiplegia.  Accompanied by her husband.  Past Medical History:  Diagnosis Date   Arthritis    Cardiomyopathy (HCC)    a. 05/2012 Echo: EF 30-35%, Gr 1 DD, mild LVH, mild MR, pericardial effusion   Chronic systolic heart failure (HCC) 08/26/2013   Diabetes mellitus    a. 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 [id number] [employer] 11/26/2013    St Jude    Pericardial effusion    a. 05/2012 Echo: circumferential pericardial effusion, mostly 44mm, largest pocket posterior - 43mm, mild RA indentation, no RV collapse, no tamponade.   Right patella fracture 07/08/2017   Stroke (HCC) 04/29/2018   Right hemiparesis in [place]   Past Surgical History:  Procedure Laterality Date   ABDOMINAL HYSTERECTOMY     BI-VENTRICULAR IMPLANTABLE CARDIOVERTER DEFIBRILLATOR N/A 12/01/2013   St. Jude BI-VENTRICULAR IMPLANTABLE CARDIOVERTER DEFIBRILLATOR  (CRT-D);  Surgeon: Duke Salvia, MD;    BIOPSY  05/06/2021    Procedure: BIOPSY;  Surgeon: Jenel Lucks, MD;  Location: [facility] ENDOSCOPY;  Service: Gastroenterology;;   BIV ICD [place] N/A 07/15/2022   Procedure: BIV ICD GENERATOR CHANGEOUT;  Surgeon: Duke Salvia, MD;  Location: [facility] INVASIVE CV LAB;  Service: Cardiovascular;  Laterality: N/A;   COLONOSCOPY WITH PROPOFOL Left 05/06/2021   Procedure: COLONOSCOPY WITH PROPOFOL;  Surgeon: Jenel Lucks, MD;  Location: [facility] ENDOSCOPY;  Service: Gastroenterology;  Laterality: Left;   ESOPHAGOGASTRODUODENOSCOPY (EGD) WITH PROPOFOL Left 05/06/2021   Procedure: ESOPHAGOGASTRODUODENOSCOPY (EGD) WITH PROPOFOL;  Surgeon: Jenel Lucks, MD;  Location: [facility] ENDOSCOPY;  Service: Gastroenterology;  Laterality: Left;   FOOT SURGERY     LEFT AND RIGHT HEART CATHETERIZATION WITH CORONARY ANGIOGRAM  05/26/2012   Procedure: LEFT AND RIGHT HEART CATHETERIZATION WITH CORONARY ANGIOGRAM;  Surgeon: Tonny Bollman, MD;  Location: [facility] CATH LAB;  Service: Cardiovascular;;   ORIF PATELLA Right 07/08/2017   Procedure: OPEN REDUCTION INTERNAL (ORIF) FIXATION PATELLA;  Surgeon: Sheral Apley, MD;  Location: [facility] OR;  Service: Orthopedics;  Laterality: Right;   Social History   Tobacco Use   Smoking status: Former    Packs/day: 2.00    Years: 3.00    Total pack years: 6.00    Types: Cigarettes    Quit date: 1986    Years since quitting: 37.9   Smokeless tobacco: Never   Tobacco comments:    Quit "long time ago."  Substance Use Topics   Alcohol use: No   Marital status: Married  ROS  Review of Systems  Cardiovascular:  Negative for chest pain, dyspnea on exertion and leg swelling.  Neurological:  Positive for focal weakness (right hemiplegia).   Objective  Blood pressure 124/68, pulse 72, height 5' 4.5" (1.638 m), weight 144 lb (65.3 kg), SpO2 93 %.     08/28/2022    1:18 PM 07/15/2022    1:10 PM 07/15/2022   12:55 PM  Vitals with BMI  Height 5' 4.5"    Weight 144 lbs    BMI 24.34     Systolic 124 120 778  Diastolic 68 77 64  Pulse 72       Physical Exam Neck:     Vascular: No carotid bruit or JVD.  Cardiovascular:     Rate and Rhythm: Normal rate and regular rhythm.     Pulses: Intact distal pulses.     Heart sounds: Normal heart sounds. No murmur heard.    No gallop.  Pulmonary:     Effort: Pulmonary effort is normal.     Breath sounds: Normal breath sounds.  Abdominal:     General: Bowel sounds are normal.     Palpations: Abdomen is soft.  Musculoskeletal:     Right lower leg: No edema.     Left lower leg: No edema.    Laboratory examination:   External labs:  Cholesterol, total 169.000 m 08/06/2022 HDL 64.000 mg 08/06/2022 LDL 83.000 mg 08/06/2022 Triglycerides 119.000 m 08/06/2022  A1C 6.700 % of 08/06/2022 TSH 3.650 08/06/2022  Hemoglobin 13.100 g/d 08/06/2022 Platelets 208.000 Th 08/06/2022  Creatinine, Serum 1.050 mg/ 08/06/2022 Potassium 5.000 mm 08/06/2022 ALT (SGPT) 35.000 U/L 08/06/2022  BNP 107.300 p 03/18/2022 no change from 03/18/2022  Allergies  No Known Allergies   Medications    Current Outpatient Medications:    acetaminophen (TYLENOL) 500 MG tablet, Take 1,000 mg by mouth every 6 (six) hours as needed for moderate pain., Disp: , Rfl:    amLODipine (NORVASC) 5 MG tablet, Take 5 mg by mouth daily., Disp: , Rfl:    atorvastatin (LIPITOR) 80 MG tablet, TAKE 1 TABLET(80 MG) BY MOUTH DAILY, Disp: 90 tablet, Rfl: 2   B-D UF III MINI PEN NEEDLES 31G X 5 MM MISC, USE 5 NEEDLES EVERY DAY AS DIRECTED, Disp: 200 each, Rfl: 0   carvedilol (COREG) 25 MG tablet, Take 25 mg by mouth 2 (two) times daily., Disp: , Rfl: 4   citalopram (CELEXA) 10 MG tablet, Take 20 mg by mouth daily., Disp: , Rfl:    Continuous Blood Gluc Receiver (FREESTYLE LIBRE 2 READER) DEVI, , Disp: , Rfl:    Continuous Blood Gluc Sensor (FREESTYLE LIBRE 2 SENSOR) MISC, 4 (four) times daily., Disp: , Rfl:    diclofenac Sodium (VOLTAREN) 1 % GEL, Apply 1 Application  topically 2 (two) times daily., Disp: , Rfl:    diphenhydrAMINE (BENADRYL) 25 MG tablet, Take 25 mg by mouth daily as needed for allergies., Disp: , Rfl:    ferrous sulfate 325 (65 FE) MG tablet, Take 1 tablet (325 mg total) by mouth 2 (two) times daily with a meal., Disp: 60 tablet, Rfl: 1   hydrALAZINE (APRESOLINE) 25 MG tablet, Take 25 mg by mouth 3 (three) times daily., Disp: , Rfl:    insulin lispro (HUMALOG KWIKPEN) 100 UNIT/ML KwikPen, Inject 12-15 Units into the skin 2 (two) times daily as needed (high blood sugar)., Disp: , Rfl:    Insulin Pen Needle (B-D UF III MINI PEN NEEDLES) 31G X 5 MM MISC,  USE 5 PEN NEEDLES PER DAY AS INSTRUCTED, Disp: 150 each, Rfl: 10   Insulin Pen Needle (B-D UF III MINI PEN NEEDLES) 31G X 5 MM MISC, USE 5 NEEDLES EVERY DAY AS DIRECTED, Disp: 200 each, Rfl: 0   irbesartan (AVAPRO) 300 MG tablet, TAKE 1 TABLET(300 MG) BY MOUTH DAILY, Disp: 90 tablet, Rfl: 0   isosorbide dinitrate (ISORDIL) 30 MG tablet, Take 30 mg by mouth 3 (three) times daily., Disp: , Rfl:    lidocaine (LIDODERM) 5 %, Place 1 patch onto the skin 2 (two) times daily., Disp: , Rfl:    NOVOLOG FLEXPEN 100 UNIT/ML FlexPen, ADMINISTER 16 UNITS UNDER THE SKIN TWICE DAILY BEFORE LUNCH AND SUPPER, Disp: 15 mL, Rfl: 0   OZEMPIC, 0.25 OR 0.5 MG/DOSE, 2 MG/3ML SOPN, Inject 0.5 mg into the skin every Monday., Disp: , Rfl:    potassium chloride SA (KLOR-CON M) 20 MEQ tablet, Take 1 tablet (20 mEq total) by mouth daily., Disp: 30 tablet, Rfl: 1   TRESIBA FLEXTOUCH 200 UNIT/ML FlexTouch Pen, Inject 44 Units into the skin daily., Disp: , Rfl:    furosemide (LASIX) 40 MG tablet, Take 1 tablet (40 mg total) by mouth daily as needed for fluid., Disp: 30 tablet, Rfl: 3   Radiology:   CT Head 09/16/2019: No evidence of acute intracranial injury. 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  Cardiac Studies:   Coronary Angiogram   [2012/06/23]: No significant coronary artery disease. Ejection fraction  35%. Mild pulmonary hypertension.  Echocardiogram 07/20/2021:   1. Left ventricular ejection fraction, by estimation, is 60 to 65%. The left ventricle has normal function. The left ventricle has no regional wall motion abnormalities. Left ventricular diastolic parameters were normal.  2. Right ventricular systolic function is normal. The right ventricular size is normal. Right ventricular wall thickness is increased.  3. The mitral valve is grossly normal. Trivial mitral valve regurgitation. No evidence of mitral stenosis.  4. The aortic valve is tricuspid. Aortic valve regurgitation is not visualized. Aortic valve sclerosis is present, with no evidence of aortic valve stenosis.  5. The inferior vena cava is normal in size with greater than 50% respiratory variability, suggesting right atrial pressure of 3 mmHg.   ICD Biventricular (ICD) in situ St[employer] Jude [employer] 07/15/2022 (Gen Change)    Scheduled  In office ICD 07/31/22  Single (S)/Dual (D)/BV (M) M Presenting APBP Pacer dependant: No. Underlying ASVS 57/min. AP 93%, . BP 97.% AMS Episodes 0 since Nov 9th 2023  HVR 0  Longevity 6.6 Years/Voltage.  Lead measurements: Stable Histogram: Low (L)/normal (N)/high (H)  Normal Patient activity Normal/good. Thoracic impedance: Initializing  Observations: Normal BV ICD function.  Changes: None  Remote ICD check 08/14/2022: AP 90%, LV 98%.  Longevity 6 years and 8 months.  There were brief reverse mode switches, no high ventricular rate episodes.  Lead impedance and thresholds are normal.  Thoracic impedance is at baseline.  Normal function.  EKG   03/14/22:AV paced rhythm, biventricular pacemaker detected.  No further analysis. No significant change from EKG 03/22/2020   Assessment     ICD-10-CM   1. Nonischemic cardiomyopathy (HCC)  I42.8 Ambulatory referral to Home Health    2. Chronic combined systolic and diastolic heart failure (HCC)  W29.56 Ambulatory referral to Home Health     3. ICD Biventricular implantable cardioverter-defibrillator (ICD) in situ [employer] Jude [employer] 07/15/2022 (Gen Change)  Z95.810     4. Type 2 diabetes mellitus without complication, with long-term  current use of insulin (HCC)  E11.9    Z79.4     5. Right hemiplegia (HCC)  G81.91 Ambulatory referral to Home Health      No orders of the defined types were placed in this encounter.  There are no discontinued medications.  Recommendations:   No orders of the defined types were placed in this encounter.   Mardy L Claus  is a 71 y.o. 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  Had BiV ICD generator change on 07/15/2022.  1. Chronic combined systolic and diastolic heart failure (HCC) Patient is presently well compensated.  No leg edema, no JVD.  Continue present medical therapy.  2. Nonischemic cardiomyopathy (HCC) Cardiomyopathy is nonischemic.  LVEF is improved since she has had BiV ICD.  3. ICD Biventricular implantable cardioverter-defibrillator (ICD) in situ [employer] 07/15/2022 (Gen Change) ICD remote intervention discussed with patient and her husband at the bedside.  Again discussed regarding managing her diabetes well and to restrict fluid intake/salt.  4. Type 2 diabetes mellitus without complication, with long-term current use of insulin (HCC) Since returning to [place], blood sugar is now again improved, husband being strict with her.  Reviewed external labs.  5. Right hemiplegia [facility]) Patient may benefit from physical therapy so that she can use ambulatory assist device.  Her LDL is >83,  "Prevail-ASCVD" study using  Cholestryl Esther transfer protein inhibitor Obecertrapib 10 mg daily in patients with ASCVD on maximal lipid-lowering therapy, LDL >80 mg and CV risk reduction. Patient is interested    [name], MD, [facility] 08/28/2022, 2:51 PM Office: [phone] Fax: [phone] Pager: [phone]

## 2022-08-29 ENCOUNTER — Emergency Department (HOSPITAL_COMMUNITY)
Admission: EM | Admit: 2022-08-29 | Discharge: 2022-08-30 | Disposition: A | Discharge status: Home / Self Care | Payer: Medicare Other | Attending: Emergency Medicine | Admitting: Emergency Medicine

## 2022-08-29 ENCOUNTER — Other Ambulatory Visit: Payer: Self-pay

## 2022-08-29 DIAGNOSIS — Z794 Long term (current) use of insulin: Secondary | ICD-10-CM | POA: Insufficient documentation

## 2022-08-29 DIAGNOSIS — E119 Type 2 diabetes mellitus without complications: Secondary | ICD-10-CM | POA: Diagnosis not present

## 2022-08-29 DIAGNOSIS — M79604 Pain in right leg: Secondary | ICD-10-CM | POA: Diagnosis not present

## 2022-08-29 DIAGNOSIS — R739 Hyperglycemia, unspecified: Secondary | ICD-10-CM | POA: Diagnosis not present

## 2022-08-29 DIAGNOSIS — R064 Hyperventilation: Secondary | ICD-10-CM | POA: Diagnosis not present

## 2022-08-29 DIAGNOSIS — Z79899 Other long term (current) drug therapy: Secondary | ICD-10-CM | POA: Diagnosis not present

## 2022-08-29 DIAGNOSIS — I10 Essential (primary) hypertension: Secondary | ICD-10-CM | POA: Diagnosis not present

## 2022-08-29 DIAGNOSIS — R0689 Other abnormalities of breathing: Secondary | ICD-10-CM | POA: Diagnosis not present

## 2022-08-29 MED ORDER — OXYCODONE-ACETAMINOPHEN 5-325 MG PO TABS
1.0000 | ORAL_TABLET | Freq: Once | ORAL | Status: AC
  Administered 2022-08-29: 1 via ORAL
  Filled 2022-08-29: qty 1

## 2022-08-29 NOTE — ED Provider Triage Note (Signed)
Emergency Medicine Provider Triage Evaluation Note  Linda Lowe , a 71 y.o. female  was evaluated in triage.  Pt complains of leg spasms, on her right thigh, started out 2 hours ago, remains constant, no associate injuries, has not had this in the past, she is not endorsing any worsening paresthesias or weakness, she is has right-sided deficits from a stroke, no other complaints..  Review of Systems  Positive: Leg cramping Negative: Chest pain, shortness of breath  Physical Exam  BP 135/73 (BP Location: Right Arm)   Pulse 71   Temp 98.7 F (37.1 C)   Resp 18   SpO2 97%  Gen:   Awake, no distress   Resp:  Normal effort  MSK:   Moves extremities without difficulty  Other:    Medical Decision Making  Medically screening exam initiated at 11:42 PM.  Appropriate orders placed.  Linda Lowe was informed that the remainder of the evaluation will be completed by another provider, this initial triage assessment does not replace that evaluation, and the importance of remaining in the ED until their evaluation is complete.  Lab work has been ordered will need further workup.   Carroll Sage, PA-C 08/29/22 [id number]

## 2022-08-29 NOTE — ED Triage Notes (Signed)
Patient arrived with EMS from home reports right leg cramps this evening .

## 2022-08-30 ENCOUNTER — Other Ambulatory Visit: Payer: Self-pay

## 2022-08-30 ENCOUNTER — Ambulatory Visit (HOSPITAL_COMMUNITY): Admission: RE | Admit: 2022-08-30 | Payer: Medicare Other | Source: Home / Self Care | Admitting: Gastroenterology

## 2022-08-30 ENCOUNTER — Ambulatory Visit (HOSPITAL_BASED_OUTPATIENT_CLINIC_OR_DEPARTMENT_OTHER): Payer: Medicare Other

## 2022-08-30 DIAGNOSIS — M79604 Pain in right leg: Secondary | ICD-10-CM | POA: Diagnosis not present

## 2022-08-30 DIAGNOSIS — R609 Edema, unspecified: Secondary | ICD-10-CM | POA: Diagnosis not present

## 2022-08-30 DIAGNOSIS — Z01818 Encounter for other preprocedural examination: Secondary | ICD-10-CM | POA: Insufficient documentation

## 2022-08-30 DIAGNOSIS — Z743 Need for continuous supervision: Secondary | ICD-10-CM | POA: Diagnosis not present

## 2022-08-30 DIAGNOSIS — M7989 Other specified soft tissue disorders: Secondary | ICD-10-CM | POA: Insufficient documentation

## 2022-08-30 DIAGNOSIS — R531 Weakness: Secondary | ICD-10-CM | POA: Diagnosis not present

## 2022-08-30 LAB — BASIC METABOLIC PANEL
Anion gap: 11 (ref 5–15)
BUN: 23 mg/dL (ref 8–23)
CO2: 21 mmol/L — ABNORMAL LOW (ref 22–32)
Calcium: 8.6 mg/dL — ABNORMAL LOW (ref 8.9–10.3)
Chloride: 99 mmol/L (ref 98–111)
Creatinine, Ser: 0.87 mg/dL (ref 0.44–1.00)
GFR, Estimated: >60 mL/min (ref >60)
Glucose, Bld: 286 mg/dL — ABNORMAL HIGH (ref 70–99)
Potassium: 4.1 mmol/L (ref 3.5–5.1)
Sodium: 131 mmol/L — ABNORMAL LOW (ref 135–145)

## 2022-08-30 LAB — CBC WITH DIFFERENTIAL/PLATELET
Abs Immature Granulocytes: 0.04 10*3/uL (ref 0.00–0.07)
Basophils Absolute: 0 10*3/uL (ref 0.0–0.1)
Basophils Relative: 0 %
Eosinophils Absolute: 0.3 10*3/uL (ref 0.0–0.5)
Eosinophils Relative: 3 %
HCT: 36.9 % (ref 36.0–46.0)
Hemoglobin: 12.6 g/dL (ref 12.0–15.0)
Immature Granulocytes: 0 %
Lymphocytes Relative: 24 %
Lymphs Abs: 2.2 10*3/uL (ref 0.7–4.0)
MCH: 31 pg (ref 26.0–34.0)
MCHC: 34.1 g/dL (ref 30.0–36.0)
MCV: 90.7 fL (ref 80.0–100.0)
Monocytes Absolute: 0.7 10*3/uL (ref 0.1–1.0)
Monocytes Relative: 8 %
Neutro Abs: 6.1 10*3/uL (ref 1.7–7.7)
Neutrophils Relative %: 65 %
Platelets: 167 10*3/uL (ref 150–400)
RBC: 4.07 MIL/uL (ref 3.87–5.11)
RDW: 12.5 % (ref 11.5–15.5)
WBC: 9.4 10*3/uL (ref 4.0–10.5)
nRBC: 0 % (ref 0.0–0.2)

## 2022-08-30 LAB — MAGNESIUM: Magnesium: 1.9 mg/dL (ref 1.7–2.4)

## 2022-08-30 LAB — CBG MONITORING, ED: Glucose-Capillary: 234 mg/dL — ABNORMAL HIGH (ref 70–99)

## 2022-08-30 SURGERY — ESOPHAGOGASTRODUODENOSCOPY (EGD) WITH PROPOFOL
Anesthesia: Monitor Anesthesia Care

## 2022-08-30 MED ORDER — ACETAMINOPHEN 500 MG PO TABS
1000.0000 mg | ORAL_TABLET | Freq: Once | ORAL | Status: AC
  Administered 2022-08-30: 1000 mg via ORAL
  Filled 2022-08-30: qty 2

## 2022-08-30 MED ORDER — TIZANIDINE HCL 2 MG PO CAPS: 2.0000 mg | ORAL_CAPSULE | Freq: Three times a day (TID) | ORAL | 0 refills | Status: AC | PRN

## 2022-08-30 NOTE — ED Notes (Signed)
Pt requesting to be discharged and have ambulance take her home. RN explained she cannot be discharged from the waiting room and cannot get ambulance transport for that reason. She called husband.

## 2022-08-30 NOTE — ED Provider Notes (Signed)
[facility] EMERGENCY DEPARTMENT Provider Note   CSN: [id number] Arrival date & time: 08/29/22  2303     History  Chief Complaint  Patient presents with   Right Leg Cramps    Linda Lowe is a 71 y.o. female.  HPI Patient with multiple medical problems, including prior stroke, several years ago with persistent right sided hemiplegia presents with right leg pain, possible spasm-like sensation.  Onset seems to be yesterday though she notes that she has had pain for some time on and off.  No obvious precipitant, no recent trauma, no change in medication.    Home Medications Prior to Admission medications   Medication Sig Start Date End Date Taking? Authorizing Provider  tizanidine (ZANAFLEX) 2 MG capsule Take 1 capsule (2 mg total) by mouth 3 (three) times daily as needed for muscle spasms. 08/30/22  Yes Gerhard Munch, MD  acetaminophen (TYLENOL) 500 MG tablet Take 1,000 mg by mouth every 6 (six) hours as needed for moderate pain.    [provider]  amLODipine (NORVASC) 5 MG tablet Take 5 mg by mouth daily.    [provider]  atorvastatin (LIPITOR) 80 MG tablet TAKE 1 TABLET(80 MG) BY MOUTH DAILY 08/07/22   [name], MD  B-D UF III MINI PEN NEEDLES 31G X 5 MM MISC USE 5 NEEDLES EVERY DAY AS DIRECTED 09/12/17   Reather Littler, MD  carvedilol (COREG) 25 MG tablet Take 25 mg by mouth 2 (two) times daily. 07/15/15   [provider]  citalopram (CELEXA) 10 MG tablet Take 20 mg by mouth daily. 07/04/22   [provider]  Continuous Blood Gluc Receiver (FREESTYLE LIBRE 2 READER) DEVI  03/12/22   [provider]  Continuous Blood Gluc Sensor (FREESTYLE LIBRE 2 SENSOR) MISC 4 (four) times daily. 06/18/22   [provider]  diclofenac Sodium (VOLTAREN) 1 % GEL Apply 1 Application topically 2 (two) times daily. 03/11/22   [provider]  diphenhydrAMINE (BENADRYL) 25 MG tablet Take 25 mg by mouth daily as needed  for allergies.    [provider]  ferrous sulfate 325 (65 FE) MG tablet Take 1 tablet (325 mg total) by mouth 2 (two) times daily with a meal. 05/08/21   Gherghe, Daylene Katayama, MD  furosemide (LASIX) 40 MG tablet Take 1 tablet (40 mg total) by mouth daily as needed for fluid. 03/14/22 08/27/22  Cantwell, Celeste C, PA-C  hydrALAZINE (APRESOLINE) 25 MG tablet Take 25 mg by mouth 3 (three) times daily. 12/01/21   [provider]  insulin lispro (HUMALOG KWIKPEN) 100 UNIT/ML KwikPen Inject 12-15 Units into the skin 2 (two) times daily as needed (high blood sugar).    [provider]  Insulin Pen Needle (B-D UF III MINI PEN NEEDLES) 31G X 5 MM MISC USE 5 PEN NEEDLES PER DAY AS INSTRUCTED 10/14/16   Reather Littler, MD  Insulin Pen Needle (B-D UF III MINI PEN NEEDLES) 31G X 5 MM MISC USE 5 NEEDLES EVERY DAY AS DIRECTED 09/12/17   Reather Littler, MD  irbesartan (AVAPRO) 300 MG tablet TAKE 1 TABLET(300 MG) BY MOUTH DAILY 08/12/22   [name], MD  isosorbide dinitrate (ISORDIL) 30 MG tablet Take 30 mg by mouth 3 (three) times daily. 03/30/22   [provider]  lidocaine (LIDODERM) 5 % Place 1 patch onto the skin 2 (two) times daily. 05/28/21   [provider]  NOVOLOG FLEXPEN 100 UNIT/ML FlexPen ADMINISTER 16 UNITS UNDER THE SKIN TWICE DAILY  BEFORE LUNCH AND SUPPER 07/24/17   Reather Littler, MD  OZEMPIC, 0.25 OR 0.5 MG/DOSE, 2 MG/3ML SOPN Inject 0.5 mg into the skin every Monday. 03/20/22   [provider]  potassium chloride SA (KLOR-CON M) 20 MEQ tablet Take 1 tablet (20 mEq total) by mouth daily. 03/14/22   Cantwell, Celeste C, PA-C  TRESIBA FLEXTOUCH 200 UNIT/ML FlexTouch Pen Inject 44 Units into the skin daily. 07/11/22   [provider]      Allergies    Patient has no known allergies.    Review of Systems   Review of Systems  All other systems reviewed and are negative.   Physical Exam Updated Vital Signs BP (!) 167/82   Pulse 79   Temp 98 F (36.7  C)   Resp (!) 26   SpO2 95%  Physical Exam Vitals and nursing note reviewed.  Constitutional:      General: She is not in acute distress.    Appearance: She is well-developed.     Comments: Chronically deconditioned, elderly female awake and alert  HENT:     Head: Normocephalic and atraumatic.  Eyes:     Conjunctiva/sclera: Conjunctivae normal.  Cardiovascular:     Rate and Rhythm: Normal rate and regular rhythm.     Pulses: Normal pulses.  Pulmonary:     Effort: Pulmonary effort is normal. No respiratory distress.     Breath sounds: No stridor.  Abdominal:     General: There is no distension.  Musculoskeletal:     Comments: No obvious deformities.  There is edema in both legs, essentially equal.  Patient does not move the right leg, but notes this is unchanged from baseline post stroke.  Skin:    General: Skin is warm and dry.  Neurological:     Mental Status: She is alert and oriented to person, place, and time.     Cranial Nerves: No cranial nerve deficit or facial asymmetry.     Comments: Hemiplegia, right-sided, unchanged according to the patient.  Psychiatric:        Mood and Affect: Mood normal.     ED Results / Procedures / Treatments   Labs (all labs ordered are listed, but only abnormal results are displayed) Labs Reviewed  BASIC METABOLIC PANEL - Abnormal; Notable for the following components:      Result Value   Sodium 131 (*)    CO2 21 (*)    Glucose, Bld 286 (*)    Calcium 8.6 (*)    All other components within normal limits  CBG MONITORING, ED - Abnormal; Notable for the following components:   Glucose-Capillary 234 (*)    All other components within normal limits  CBC WITH DIFFERENTIAL/PLATELET  MAGNESIUM    EKG None  Radiology VAS [place] LOWER EXTREMITY VENOUS (DVT) (7a-7p)  Result Date: 08/30/2022  Lower Venous DVT Study Patient Name:  Linda Lowe  Date of Exam:   08/30/2022 Medical Rec #: [id number]     Accession #:    [id number] Date of  Birth: 10/27/1950     Patient Gender: F Patient Age:   65 years Exam Location:  [facility] Procedure:      VAS [place] LOWER EXTREMITY VENOUS (DVT) Referring Phys: Molly Maduro Jantz Main --------------------------------------------------------------------------------  Indications: Swelling, and Edema.  Limitations: Patient positioning. Comparison Study: 09/02/19 prior Performing Technologist: [place] Ponder RVS  Examination Guidelines: A complete evaluation includes B-mode imaging, spectral Doppler, color Doppler, and power Doppler as needed of all accessible portions of  each vessel. Bilateral testing is considered an integral part of a complete examination. Limited examinations for reoccurring indications may be performed as noted. The reflux portion of the exam is performed with the patient in reverse Trendelenburg.  +---------+---------------+---------+-----------+----------+--------------+ RIGHT    CompressibilityPhasicitySpontaneityPropertiesThrombus Aging +---------+---------------+---------+-----------+----------+--------------+ CFV      Full           Yes      Yes                                 +---------+---------------+---------+-----------+----------+--------------+ SFJ      Full                                                        +---------+---------------+---------+-----------+----------+--------------+ FV Prox  Full                                                        +---------+---------------+---------+-----------+----------+--------------+ FV Mid   Full                                                        +---------+---------------+---------+-----------+----------+--------------+ FV Distal               Yes      Yes                                 +---------+---------------+---------+-----------+----------+--------------+ PFV      Full                                                         +---------+---------------+---------+-----------+----------+--------------+ POP      Full           Yes      Yes                                 +---------+---------------+---------+-----------+----------+--------------+ PTV      Full                                                        +---------+---------------+---------+-----------+----------+--------------+ PERO     Full           Yes      Yes                                 +---------+---------------+---------+-----------+----------+--------------+   +----+---------------+---------+-----------+----------+--------------+ LEFTCompressibilityPhasicitySpontaneityPropertiesThrombus Aging +----+---------------+---------+-----------+----------+--------------+ CFV Full  Yes      Yes                                 +----+---------------+---------+-----------+----------+--------------+     Summary: RIGHT: - There is no evidence of deep vein thrombosis in the lower extremity.  - No cystic structure found in the popliteal fossa.  LEFT: - No evidence of common femoral vein obstruction.  *See table(s) above for measurements and observations.    Preliminary     Procedures Procedures    Medications Ordered in ED Medications  oxyCODONE-acetaminophen (PERCOCET/ROXICET) 5-325 MG per tablet 1 tablet (1 tablet Oral Given 08/29/22 2338)  acetaminophen (TYLENOL) tablet 1,000 mg (1,000 mg Oral Given 08/30/22 5956)    ED Course/ Medical Decision Making/ A&P This patient with a Hx of stroke, pacemaker placement, cardiomyopathy, hypertension, diabetes presents to the ED for concern of right leg pain, this involves an extensive number of treatment options, and is a complaint that carries with it a high risk of complications and morbidity.    The differential diagnosis includes post stroke sequelae, acute on chronic pain, DVT, infection   Social Determinants of Health:  Bedbound status, prior stroke, English as a second  language  Additional history obtained:  Additional history and/or information obtained from chart review, notable for ongoing efforts poststroke, including evaluation of her pacemaker from cardiology   After the initial evaluation, orders, including: Labs from triage augmented with ultrasound were initiated.   Patient placed on Cardiac and Pulse-Oximetry Monitors. The patient was maintained on a cardiac monitor.  The cardiac monitored showed an rhythm of 80 sinus normal The patient was also maintained on pulse oximetry. The readings were typically 100% room air normal   On repeat evaluation of the patient stayed the same  Lab Tests:  I personally interpreted labs.  The pertinent results include: Mild hyperglycemia otherwise unremarkable  Imaging Studies ordered:  I independently visualized and interpreted imaging which showed no evidence for DVT, right leg I agree with the radiologist interpretation   Dispostion / Final MDM:  After consideration of the diagnostic results and the patient's response to treatment, patient is awake, alert, in no distress on repeat exam.  She remains hemodynamically unremarkable aside from mild hypertension.  We discussed all findings and I discussed them with her husband via telephone as well.  Some suspicion for acute on chronic discomfort given her hemiplegia, prior stroke, bedbound status given the reassuring findings here today.  No evidence for bacteremia, sepsis, cellulitis, DVT.  Patient, and husband both comfortable with discharge, outpatient follow-up.   Final Clinical Impression(s) / ED Diagnoses Final diagnoses:  Right leg pain    Rx / DC Orders ED Discharge Orders          Ordered    tizanidine (ZANAFLEX) 2 MG capsule  3 times daily PRN        08/30/22 1045              Gerhard Munch, MD 08/30/22 1046

## 2022-08-30 NOTE — Progress Notes (Signed)
Lower extremity venous has been completed.   Preliminary results in CV Proc.   Aundra Millet Nashalie Sallis 08/30/2022 9:53 AM

## 2022-08-30 NOTE — ED Notes (Signed)
Pt husband Aneta Mins requesting a call when patient is brought back to a room [phone]

## 2022-08-30 NOTE — Discharge Instructions (Signed)
As discussed, today's evaluation has been generally reassuring.  There is no evidence for a blood clot, no obvious infection in your leg.  However, given your medical issues, and history it is importantly follow-up with your primary care physician.  Please discuss the medication you have started today for leg cramps, and ensure appropriate ongoing care with your physician.  Return here for concerning changes in your condition.

## 2022-08-31 DIAGNOSIS — M79662 Pain in left lower leg: Secondary | ICD-10-CM | POA: Diagnosis not present

## 2022-08-31 DIAGNOSIS — M79661 Pain in right lower leg: Secondary | ICD-10-CM | POA: Diagnosis not present

## 2022-08-31 DIAGNOSIS — E1142 Type 2 diabetes mellitus with diabetic polyneuropathy: Secondary | ICD-10-CM | POA: Diagnosis not present

## 2022-09-02 DIAGNOSIS — M25561 Pain in right knee: Secondary | ICD-10-CM | POA: Diagnosis not present

## 2022-09-03 DIAGNOSIS — Z794 Long term (current) use of insulin: Secondary | ICD-10-CM | POA: Diagnosis not present

## 2022-09-03 DIAGNOSIS — I110 Hypertensive heart disease with heart failure: Secondary | ICD-10-CM | POA: Diagnosis not present

## 2022-09-03 DIAGNOSIS — I5042 Chronic combined systolic (congestive) and diastolic (congestive) heart failure: Secondary | ICD-10-CM | POA: Diagnosis not present

## 2022-09-03 DIAGNOSIS — I428 Other cardiomyopathies: Secondary | ICD-10-CM | POA: Diagnosis not present

## 2022-09-03 DIAGNOSIS — Z7985 Long-term (current) use of injectable non-insulin antidiabetic drugs: Secondary | ICD-10-CM | POA: Diagnosis not present

## 2022-09-03 DIAGNOSIS — Z95810 Presence of automatic (implantable) cardiac defibrillator: Secondary | ICD-10-CM | POA: Diagnosis not present

## 2022-09-03 DIAGNOSIS — M069 Rheumatoid arthritis, unspecified: Secondary | ICD-10-CM | POA: Diagnosis not present

## 2022-09-03 DIAGNOSIS — G8191 Hemiplegia, unspecified affecting right dominant side: Secondary | ICD-10-CM | POA: Diagnosis not present

## 2022-09-03 DIAGNOSIS — E7800 Pure hypercholesterolemia, unspecified: Secondary | ICD-10-CM | POA: Diagnosis not present

## 2022-09-03 DIAGNOSIS — E119 Type 2 diabetes mellitus without complications: Secondary | ICD-10-CM | POA: Diagnosis not present

## 2022-09-05 DIAGNOSIS — E119 Type 2 diabetes mellitus without complications: Secondary | ICD-10-CM | POA: Diagnosis not present

## 2022-09-05 DIAGNOSIS — G8191 Hemiplegia, unspecified affecting right dominant side: Secondary | ICD-10-CM | POA: Diagnosis not present

## 2022-09-05 DIAGNOSIS — I5042 Chronic combined systolic (congestive) and diastolic (congestive) heart failure: Secondary | ICD-10-CM | POA: Diagnosis not present

## 2022-09-05 DIAGNOSIS — Z7985 Long-term (current) use of injectable non-insulin antidiabetic drugs: Secondary | ICD-10-CM | POA: Diagnosis not present

## 2022-09-05 DIAGNOSIS — I428 Other cardiomyopathies: Secondary | ICD-10-CM | POA: Diagnosis not present

## 2022-09-05 DIAGNOSIS — Z95810 Presence of automatic (implantable) cardiac defibrillator: Secondary | ICD-10-CM | POA: Diagnosis not present

## 2022-09-05 DIAGNOSIS — Z794 Long term (current) use of insulin: Secondary | ICD-10-CM | POA: Diagnosis not present

## 2022-09-05 DIAGNOSIS — I110 Hypertensive heart disease with heart failure: Secondary | ICD-10-CM | POA: Diagnosis not present

## 2022-09-05 DIAGNOSIS — E7800 Pure hypercholesterolemia, unspecified: Secondary | ICD-10-CM | POA: Diagnosis not present

## 2022-09-05 DIAGNOSIS — M069 Rheumatoid arthritis, unspecified: Secondary | ICD-10-CM | POA: Diagnosis not present

## 2022-09-06 ENCOUNTER — Ambulatory Visit: Payer: Medicare Other | Admitting: Cardiology

## 2022-09-10 DIAGNOSIS — I110 Hypertensive heart disease with heart failure: Secondary | ICD-10-CM | POA: Diagnosis not present

## 2022-09-10 DIAGNOSIS — E119 Type 2 diabetes mellitus without complications: Secondary | ICD-10-CM | POA: Diagnosis not present

## 2022-09-10 DIAGNOSIS — M069 Rheumatoid arthritis, unspecified: Secondary | ICD-10-CM | POA: Diagnosis not present

## 2022-09-10 DIAGNOSIS — I5042 Chronic combined systolic (congestive) and diastolic (congestive) heart failure: Secondary | ICD-10-CM | POA: Diagnosis not present

## 2022-09-10 DIAGNOSIS — E7800 Pure hypercholesterolemia, unspecified: Secondary | ICD-10-CM | POA: Diagnosis not present

## 2022-09-10 DIAGNOSIS — G8191 Hemiplegia, unspecified affecting right dominant side: Secondary | ICD-10-CM | POA: Diagnosis not present

## 2022-09-10 DIAGNOSIS — I428 Other cardiomyopathies: Secondary | ICD-10-CM | POA: Diagnosis not present

## 2022-09-10 DIAGNOSIS — Z95810 Presence of automatic (implantable) cardiac defibrillator: Secondary | ICD-10-CM | POA: Diagnosis not present

## 2022-09-10 DIAGNOSIS — Z794 Long term (current) use of insulin: Secondary | ICD-10-CM | POA: Diagnosis not present

## 2022-09-10 DIAGNOSIS — Z7985 Long-term (current) use of injectable non-insulin antidiabetic drugs: Secondary | ICD-10-CM | POA: Diagnosis not present

## 2022-09-14 DIAGNOSIS — E782 Mixed hyperlipidemia: Secondary | ICD-10-CM | POA: Diagnosis not present

## 2022-09-14 DIAGNOSIS — E1169 Type 2 diabetes mellitus with other specified complication: Secondary | ICD-10-CM | POA: Diagnosis not present

## 2022-09-17 ENCOUNTER — Encounter: Payer: TRICARE For Life (TFL) | Admitting: Cardiology

## 2022-09-18 ENCOUNTER — Telehealth: Payer: Self-pay | Admitting: Cardiology

## 2022-09-18 DIAGNOSIS — G8191 Hemiplegia, unspecified affecting right dominant side: Secondary | ICD-10-CM | POA: Diagnosis not present

## 2022-09-18 DIAGNOSIS — I428 Other cardiomyopathies: Secondary | ICD-10-CM | POA: Diagnosis not present

## 2022-09-18 DIAGNOSIS — I5042 Chronic combined systolic (congestive) and diastolic (congestive) heart failure: Secondary | ICD-10-CM | POA: Diagnosis not present

## 2022-09-18 NOTE — Telephone Encounter (Signed)
Chief Complaint  Patient presents with   Cerebrovascular Accident    Need for PT/OT consult and [place]     ICD-10-CM   1. Nonischemic cardiomyopathy (HCC)  I42.8     2. Right hemiplegia ([place])  G81.91     3. Chronic combined systolic and diastolic heart failure (HCC)  I50.42      I have reviewed the orders for home health plans of care and approved the orders.

## 2022-09-19 DIAGNOSIS — Z794 Long term (current) use of insulin: Secondary | ICD-10-CM | POA: Diagnosis not present

## 2022-09-19 DIAGNOSIS — E1169 Type 2 diabetes mellitus with other specified complication: Secondary | ICD-10-CM | POA: Diagnosis not present

## 2022-09-19 DIAGNOSIS — R252 Cramp and spasm: Secondary | ICD-10-CM | POA: Diagnosis not present

## 2022-09-19 DIAGNOSIS — I110 Hypertensive heart disease with heart failure: Secondary | ICD-10-CM | POA: Diagnosis not present

## 2022-09-19 DIAGNOSIS — I5022 Chronic systolic (congestive) heart failure: Secondary | ICD-10-CM | POA: Diagnosis not present

## 2022-09-20 DIAGNOSIS — E7800 Pure hypercholesterolemia, unspecified: Secondary | ICD-10-CM | POA: Diagnosis not present

## 2022-09-20 DIAGNOSIS — I5042 Chronic combined systolic (congestive) and diastolic (congestive) heart failure: Secondary | ICD-10-CM | POA: Diagnosis not present

## 2022-09-20 DIAGNOSIS — M069 Rheumatoid arthritis, unspecified: Secondary | ICD-10-CM | POA: Diagnosis not present

## 2022-09-20 DIAGNOSIS — Z95810 Presence of automatic (implantable) cardiac defibrillator: Secondary | ICD-10-CM | POA: Diagnosis not present

## 2022-09-20 DIAGNOSIS — E119 Type 2 diabetes mellitus without complications: Secondary | ICD-10-CM | POA: Diagnosis not present

## 2022-09-20 DIAGNOSIS — I428 Other cardiomyopathies: Secondary | ICD-10-CM | POA: Diagnosis not present

## 2022-09-20 DIAGNOSIS — G8191 Hemiplegia, unspecified affecting right dominant side: Secondary | ICD-10-CM | POA: Diagnosis not present

## 2022-09-20 DIAGNOSIS — Z794 Long term (current) use of insulin: Secondary | ICD-10-CM | POA: Diagnosis not present

## 2022-09-20 DIAGNOSIS — Z7985 Long-term (current) use of injectable non-insulin antidiabetic drugs: Secondary | ICD-10-CM | POA: Diagnosis not present

## 2022-09-20 DIAGNOSIS — I110 Hypertensive heart disease with heart failure: Secondary | ICD-10-CM | POA: Diagnosis not present

## 2022-09-25 DIAGNOSIS — I428 Other cardiomyopathies: Secondary | ICD-10-CM | POA: Diagnosis not present

## 2022-09-25 DIAGNOSIS — E119 Type 2 diabetes mellitus without complications: Secondary | ICD-10-CM | POA: Diagnosis not present

## 2022-09-25 DIAGNOSIS — I5042 Chronic combined systolic (congestive) and diastolic (congestive) heart failure: Secondary | ICD-10-CM | POA: Diagnosis not present

## 2022-09-25 DIAGNOSIS — Z7985 Long-term (current) use of injectable non-insulin antidiabetic drugs: Secondary | ICD-10-CM | POA: Diagnosis not present

## 2022-09-25 DIAGNOSIS — Z794 Long term (current) use of insulin: Secondary | ICD-10-CM | POA: Diagnosis not present

## 2022-09-25 DIAGNOSIS — I110 Hypertensive heart disease with heart failure: Secondary | ICD-10-CM | POA: Diagnosis not present

## 2022-09-25 DIAGNOSIS — Z95810 Presence of automatic (implantable) cardiac defibrillator: Secondary | ICD-10-CM | POA: Diagnosis not present

## 2022-09-25 DIAGNOSIS — M069 Rheumatoid arthritis, unspecified: Secondary | ICD-10-CM | POA: Diagnosis not present

## 2022-09-25 DIAGNOSIS — G8191 Hemiplegia, unspecified affecting right dominant side: Secondary | ICD-10-CM | POA: Diagnosis not present

## 2022-09-25 DIAGNOSIS — E7800 Pure hypercholesterolemia, unspecified: Secondary | ICD-10-CM | POA: Diagnosis not present

## 2022-09-26 DIAGNOSIS — E7800 Pure hypercholesterolemia, unspecified: Secondary | ICD-10-CM | POA: Diagnosis not present

## 2022-09-26 DIAGNOSIS — Z794 Long term (current) use of insulin: Secondary | ICD-10-CM | POA: Diagnosis not present

## 2022-09-26 DIAGNOSIS — Z95810 Presence of automatic (implantable) cardiac defibrillator: Secondary | ICD-10-CM | POA: Diagnosis not present

## 2022-09-26 DIAGNOSIS — G8191 Hemiplegia, unspecified affecting right dominant side: Secondary | ICD-10-CM | POA: Diagnosis not present

## 2022-09-26 DIAGNOSIS — Z7985 Long-term (current) use of injectable non-insulin antidiabetic drugs: Secondary | ICD-10-CM | POA: Diagnosis not present

## 2022-09-26 DIAGNOSIS — M069 Rheumatoid arthritis, unspecified: Secondary | ICD-10-CM | POA: Diagnosis not present

## 2022-09-26 DIAGNOSIS — E119 Type 2 diabetes mellitus without complications: Secondary | ICD-10-CM | POA: Diagnosis not present

## 2022-09-26 DIAGNOSIS — I5042 Chronic combined systolic (congestive) and diastolic (congestive) heart failure: Secondary | ICD-10-CM | POA: Diagnosis not present

## 2022-09-26 DIAGNOSIS — I110 Hypertensive heart disease with heart failure: Secondary | ICD-10-CM | POA: Diagnosis not present

## 2022-09-26 DIAGNOSIS — I428 Other cardiomyopathies: Secondary | ICD-10-CM | POA: Diagnosis not present

## 2022-09-30 DIAGNOSIS — M25561 Pain in right knee: Secondary | ICD-10-CM | POA: Diagnosis not present

## 2022-10-02 DIAGNOSIS — I428 Other cardiomyopathies: Secondary | ICD-10-CM | POA: Diagnosis not present

## 2022-10-02 DIAGNOSIS — Z7985 Long-term (current) use of injectable non-insulin antidiabetic drugs: Secondary | ICD-10-CM | POA: Diagnosis not present

## 2022-10-02 DIAGNOSIS — I5042 Chronic combined systolic (congestive) and diastolic (congestive) heart failure: Secondary | ICD-10-CM | POA: Diagnosis not present

## 2022-10-02 DIAGNOSIS — E7800 Pure hypercholesterolemia, unspecified: Secondary | ICD-10-CM | POA: Diagnosis not present

## 2022-10-02 DIAGNOSIS — Z95810 Presence of automatic (implantable) cardiac defibrillator: Secondary | ICD-10-CM | POA: Diagnosis not present

## 2022-10-02 DIAGNOSIS — G8191 Hemiplegia, unspecified affecting right dominant side: Secondary | ICD-10-CM | POA: Diagnosis not present

## 2022-10-02 DIAGNOSIS — E119 Type 2 diabetes mellitus without complications: Secondary | ICD-10-CM | POA: Diagnosis not present

## 2022-10-02 DIAGNOSIS — I110 Hypertensive heart disease with heart failure: Secondary | ICD-10-CM | POA: Diagnosis not present

## 2022-10-02 DIAGNOSIS — M069 Rheumatoid arthritis, unspecified: Secondary | ICD-10-CM | POA: Diagnosis not present

## 2022-10-02 DIAGNOSIS — Z794 Long term (current) use of insulin: Secondary | ICD-10-CM | POA: Diagnosis not present

## 2022-10-04 DIAGNOSIS — M069 Rheumatoid arthritis, unspecified: Secondary | ICD-10-CM | POA: Diagnosis not present

## 2022-10-04 DIAGNOSIS — E7800 Pure hypercholesterolemia, unspecified: Secondary | ICD-10-CM | POA: Diagnosis not present

## 2022-10-04 DIAGNOSIS — Z7985 Long-term (current) use of injectable non-insulin antidiabetic drugs: Secondary | ICD-10-CM | POA: Diagnosis not present

## 2022-10-04 DIAGNOSIS — Z794 Long term (current) use of insulin: Secondary | ICD-10-CM | POA: Diagnosis not present

## 2022-10-04 DIAGNOSIS — I5042 Chronic combined systolic (congestive) and diastolic (congestive) heart failure: Secondary | ICD-10-CM | POA: Diagnosis not present

## 2022-10-04 DIAGNOSIS — I110 Hypertensive heart disease with heart failure: Secondary | ICD-10-CM | POA: Diagnosis not present

## 2022-10-04 DIAGNOSIS — G8191 Hemiplegia, unspecified affecting right dominant side: Secondary | ICD-10-CM | POA: Diagnosis not present

## 2022-10-04 DIAGNOSIS — I428 Other cardiomyopathies: Secondary | ICD-10-CM | POA: Diagnosis not present

## 2022-10-04 DIAGNOSIS — E119 Type 2 diabetes mellitus without complications: Secondary | ICD-10-CM | POA: Diagnosis not present

## 2022-10-04 DIAGNOSIS — Z95810 Presence of automatic (implantable) cardiac defibrillator: Secondary | ICD-10-CM | POA: Diagnosis not present

## 2022-10-07 DIAGNOSIS — I110 Hypertensive heart disease with heart failure: Secondary | ICD-10-CM | POA: Diagnosis not present

## 2022-10-07 DIAGNOSIS — E119 Type 2 diabetes mellitus without complications: Secondary | ICD-10-CM | POA: Diagnosis not present

## 2022-10-07 DIAGNOSIS — I428 Other cardiomyopathies: Secondary | ICD-10-CM | POA: Diagnosis not present

## 2022-10-07 DIAGNOSIS — Z95810 Presence of automatic (implantable) cardiac defibrillator: Secondary | ICD-10-CM | POA: Diagnosis not present

## 2022-10-07 DIAGNOSIS — I5042 Chronic combined systolic (congestive) and diastolic (congestive) heart failure: Secondary | ICD-10-CM | POA: Diagnosis not present

## 2022-10-07 DIAGNOSIS — Z7985 Long-term (current) use of injectable non-insulin antidiabetic drugs: Secondary | ICD-10-CM | POA: Diagnosis not present

## 2022-10-07 DIAGNOSIS — E7800 Pure hypercholesterolemia, unspecified: Secondary | ICD-10-CM | POA: Diagnosis not present

## 2022-10-07 DIAGNOSIS — M069 Rheumatoid arthritis, unspecified: Secondary | ICD-10-CM | POA: Diagnosis not present

## 2022-10-07 DIAGNOSIS — G8191 Hemiplegia, unspecified affecting right dominant side: Secondary | ICD-10-CM | POA: Diagnosis not present

## 2022-10-07 DIAGNOSIS — Z794 Long term (current) use of insulin: Secondary | ICD-10-CM | POA: Diagnosis not present

## 2022-10-09 DIAGNOSIS — I428 Other cardiomyopathies: Secondary | ICD-10-CM | POA: Diagnosis not present

## 2022-10-09 DIAGNOSIS — Z794 Long term (current) use of insulin: Secondary | ICD-10-CM | POA: Diagnosis not present

## 2022-10-09 DIAGNOSIS — E119 Type 2 diabetes mellitus without complications: Secondary | ICD-10-CM | POA: Diagnosis not present

## 2022-10-09 DIAGNOSIS — G8191 Hemiplegia, unspecified affecting right dominant side: Secondary | ICD-10-CM | POA: Diagnosis not present

## 2022-10-09 DIAGNOSIS — Z95810 Presence of automatic (implantable) cardiac defibrillator: Secondary | ICD-10-CM | POA: Diagnosis not present

## 2022-10-09 DIAGNOSIS — E7800 Pure hypercholesterolemia, unspecified: Secondary | ICD-10-CM | POA: Diagnosis not present

## 2022-10-09 DIAGNOSIS — I110 Hypertensive heart disease with heart failure: Secondary | ICD-10-CM | POA: Diagnosis not present

## 2022-10-09 DIAGNOSIS — Z7985 Long-term (current) use of injectable non-insulin antidiabetic drugs: Secondary | ICD-10-CM | POA: Diagnosis not present

## 2022-10-09 DIAGNOSIS — I5042 Chronic combined systolic (congestive) and diastolic (congestive) heart failure: Secondary | ICD-10-CM | POA: Diagnosis not present

## 2022-10-09 DIAGNOSIS — M069 Rheumatoid arthritis, unspecified: Secondary | ICD-10-CM | POA: Diagnosis not present

## 2022-10-11 DIAGNOSIS — I110 Hypertensive heart disease with heart failure: Secondary | ICD-10-CM | POA: Diagnosis not present

## 2022-10-11 DIAGNOSIS — G8191 Hemiplegia, unspecified affecting right dominant side: Secondary | ICD-10-CM | POA: Diagnosis not present

## 2022-10-11 DIAGNOSIS — I5042 Chronic combined systolic (congestive) and diastolic (congestive) heart failure: Secondary | ICD-10-CM | POA: Diagnosis not present

## 2022-10-11 DIAGNOSIS — I428 Other cardiomyopathies: Secondary | ICD-10-CM | POA: Diagnosis not present

## 2022-10-11 DIAGNOSIS — Z95810 Presence of automatic (implantable) cardiac defibrillator: Secondary | ICD-10-CM | POA: Diagnosis not present

## 2022-10-11 DIAGNOSIS — E119 Type 2 diabetes mellitus without complications: Secondary | ICD-10-CM | POA: Diagnosis not present

## 2022-10-11 DIAGNOSIS — M069 Rheumatoid arthritis, unspecified: Secondary | ICD-10-CM | POA: Diagnosis not present

## 2022-10-11 DIAGNOSIS — E7800 Pure hypercholesterolemia, unspecified: Secondary | ICD-10-CM | POA: Diagnosis not present

## 2022-10-11 DIAGNOSIS — Z7985 Long-term (current) use of injectable non-insulin antidiabetic drugs: Secondary | ICD-10-CM | POA: Diagnosis not present

## 2022-10-11 DIAGNOSIS — Z794 Long term (current) use of insulin: Secondary | ICD-10-CM | POA: Diagnosis not present

## 2022-10-14 DIAGNOSIS — Z7985 Long-term (current) use of injectable non-insulin antidiabetic drugs: Secondary | ICD-10-CM | POA: Diagnosis not present

## 2022-10-14 DIAGNOSIS — I428 Other cardiomyopathies: Secondary | ICD-10-CM | POA: Diagnosis not present

## 2022-10-14 DIAGNOSIS — M069 Rheumatoid arthritis, unspecified: Secondary | ICD-10-CM | POA: Diagnosis not present

## 2022-10-14 DIAGNOSIS — E119 Type 2 diabetes mellitus without complications: Secondary | ICD-10-CM | POA: Diagnosis not present

## 2022-10-14 DIAGNOSIS — G8191 Hemiplegia, unspecified affecting right dominant side: Secondary | ICD-10-CM | POA: Diagnosis not present

## 2022-10-14 DIAGNOSIS — I5042 Chronic combined systolic (congestive) and diastolic (congestive) heart failure: Secondary | ICD-10-CM | POA: Diagnosis not present

## 2022-10-14 DIAGNOSIS — E7800 Pure hypercholesterolemia, unspecified: Secondary | ICD-10-CM | POA: Diagnosis not present

## 2022-10-14 DIAGNOSIS — Z794 Long term (current) use of insulin: Secondary | ICD-10-CM | POA: Diagnosis not present

## 2022-10-14 DIAGNOSIS — I110 Hypertensive heart disease with heart failure: Secondary | ICD-10-CM | POA: Diagnosis not present

## 2022-10-14 DIAGNOSIS — Z95810 Presence of automatic (implantable) cardiac defibrillator: Secondary | ICD-10-CM | POA: Diagnosis not present

## 2022-10-16 DIAGNOSIS — Z7985 Long-term (current) use of injectable non-insulin antidiabetic drugs: Secondary | ICD-10-CM | POA: Diagnosis not present

## 2022-10-16 DIAGNOSIS — M069 Rheumatoid arthritis, unspecified: Secondary | ICD-10-CM | POA: Diagnosis not present

## 2022-10-16 DIAGNOSIS — I110 Hypertensive heart disease with heart failure: Secondary | ICD-10-CM | POA: Diagnosis not present

## 2022-10-16 DIAGNOSIS — Z794 Long term (current) use of insulin: Secondary | ICD-10-CM | POA: Diagnosis not present

## 2022-10-16 DIAGNOSIS — E119 Type 2 diabetes mellitus without complications: Secondary | ICD-10-CM | POA: Diagnosis not present

## 2022-10-16 DIAGNOSIS — G8191 Hemiplegia, unspecified affecting right dominant side: Secondary | ICD-10-CM | POA: Diagnosis not present

## 2022-10-16 DIAGNOSIS — Z95810 Presence of automatic (implantable) cardiac defibrillator: Secondary | ICD-10-CM | POA: Diagnosis not present

## 2022-10-16 DIAGNOSIS — E7800 Pure hypercholesterolemia, unspecified: Secondary | ICD-10-CM | POA: Diagnosis not present

## 2022-10-16 DIAGNOSIS — I428 Other cardiomyopathies: Secondary | ICD-10-CM | POA: Diagnosis not present

## 2022-10-16 DIAGNOSIS — I5042 Chronic combined systolic (congestive) and diastolic (congestive) heart failure: Secondary | ICD-10-CM | POA: Diagnosis not present

## 2022-10-17 DIAGNOSIS — M069 Rheumatoid arthritis, unspecified: Secondary | ICD-10-CM | POA: Diagnosis not present

## 2022-10-17 DIAGNOSIS — Z7985 Long-term (current) use of injectable non-insulin antidiabetic drugs: Secondary | ICD-10-CM | POA: Diagnosis not present

## 2022-10-17 DIAGNOSIS — Z95810 Presence of automatic (implantable) cardiac defibrillator: Secondary | ICD-10-CM | POA: Diagnosis not present

## 2022-10-17 DIAGNOSIS — G8191 Hemiplegia, unspecified affecting right dominant side: Secondary | ICD-10-CM | POA: Diagnosis not present

## 2022-10-17 DIAGNOSIS — I110 Hypertensive heart disease with heart failure: Secondary | ICD-10-CM | POA: Diagnosis not present

## 2022-10-17 DIAGNOSIS — Z794 Long term (current) use of insulin: Secondary | ICD-10-CM | POA: Diagnosis not present

## 2022-10-17 DIAGNOSIS — I428 Other cardiomyopathies: Secondary | ICD-10-CM | POA: Diagnosis not present

## 2022-10-17 DIAGNOSIS — E7800 Pure hypercholesterolemia, unspecified: Secondary | ICD-10-CM | POA: Diagnosis not present

## 2022-10-17 DIAGNOSIS — E119 Type 2 diabetes mellitus without complications: Secondary | ICD-10-CM | POA: Diagnosis not present

## 2022-10-17 DIAGNOSIS — I5042 Chronic combined systolic (congestive) and diastolic (congestive) heart failure: Secondary | ICD-10-CM | POA: Diagnosis not present

## 2022-10-18 DIAGNOSIS — I428 Other cardiomyopathies: Secondary | ICD-10-CM | POA: Diagnosis not present

## 2022-10-18 DIAGNOSIS — Z7985 Long-term (current) use of injectable non-insulin antidiabetic drugs: Secondary | ICD-10-CM | POA: Diagnosis not present

## 2022-10-18 DIAGNOSIS — I110 Hypertensive heart disease with heart failure: Secondary | ICD-10-CM | POA: Diagnosis not present

## 2022-10-18 DIAGNOSIS — G8191 Hemiplegia, unspecified affecting right dominant side: Secondary | ICD-10-CM | POA: Diagnosis not present

## 2022-10-18 DIAGNOSIS — I5042 Chronic combined systolic (congestive) and diastolic (congestive) heart failure: Secondary | ICD-10-CM | POA: Diagnosis not present

## 2022-10-18 DIAGNOSIS — M069 Rheumatoid arthritis, unspecified: Secondary | ICD-10-CM | POA: Diagnosis not present

## 2022-10-18 DIAGNOSIS — Z794 Long term (current) use of insulin: Secondary | ICD-10-CM | POA: Diagnosis not present

## 2022-10-18 DIAGNOSIS — E7800 Pure hypercholesterolemia, unspecified: Secondary | ICD-10-CM | POA: Diagnosis not present

## 2022-10-18 DIAGNOSIS — E119 Type 2 diabetes mellitus without complications: Secondary | ICD-10-CM | POA: Diagnosis not present

## 2022-10-18 DIAGNOSIS — Z95810 Presence of automatic (implantable) cardiac defibrillator: Secondary | ICD-10-CM | POA: Diagnosis not present

## 2022-10-22 ENCOUNTER — Ambulatory Visit: Payer: Medicare Other | Attending: Internal Medicine | Admitting: Internal Medicine

## 2022-10-22 ENCOUNTER — Encounter: Payer: Self-pay | Admitting: Internal Medicine

## 2022-10-22 VITALS — BP 100/56 | HR 65 | Ht 64.5 in | Wt 165.0 lb

## 2022-10-22 DIAGNOSIS — M069 Rheumatoid arthritis, unspecified: Secondary | ICD-10-CM | POA: Diagnosis not present

## 2022-10-22 DIAGNOSIS — Z794 Long term (current) use of insulin: Secondary | ICD-10-CM | POA: Diagnosis not present

## 2022-10-22 DIAGNOSIS — E7800 Pure hypercholesterolemia, unspecified: Secondary | ICD-10-CM | POA: Diagnosis not present

## 2022-10-22 DIAGNOSIS — Z7985 Long-term (current) use of injectable non-insulin antidiabetic drugs: Secondary | ICD-10-CM | POA: Diagnosis not present

## 2022-10-22 DIAGNOSIS — I5042 Chronic combined systolic (congestive) and diastolic (congestive) heart failure: Secondary | ICD-10-CM | POA: Diagnosis not present

## 2022-10-22 DIAGNOSIS — Z95810 Presence of automatic (implantable) cardiac defibrillator: Secondary | ICD-10-CM | POA: Diagnosis not present

## 2022-10-22 DIAGNOSIS — G8191 Hemiplegia, unspecified affecting right dominant side: Secondary | ICD-10-CM | POA: Diagnosis not present

## 2022-10-22 DIAGNOSIS — E119 Type 2 diabetes mellitus without complications: Secondary | ICD-10-CM | POA: Diagnosis not present

## 2022-10-22 DIAGNOSIS — I428 Other cardiomyopathies: Secondary | ICD-10-CM | POA: Diagnosis not present

## 2022-10-22 DIAGNOSIS — R0600 Dyspnea, unspecified: Secondary | ICD-10-CM | POA: Diagnosis not present

## 2022-10-22 DIAGNOSIS — I110 Hypertensive heart disease with heart failure: Secondary | ICD-10-CM | POA: Diagnosis not present

## 2022-10-22 NOTE — Patient Instructions (Signed)
Medication Instructions:  Your physician recommends that you continue on your current medications as directed. Please refer to the Current Medication list given to you today.  *If you need a refill on your cardiac medications before your next appointment, please call your pharmacy*   Lab Work: None ordered.  If you have labs (blood work) drawn today and your tests are completely normal, you will receive your results only by: [place] (if you have MyChart) OR A paper copy in the mail If you have any lab test that is abnormal or we need to change your treatment, we will call you to review the results.   Testing/Procedures: A chest x-ray takes a picture of the organs and structures inside the chest, including the heart, lungs, and blood vessels. This test can show several things, including, whether the heart is enlarges; whether fluid is building up in the lungs; and whether pacemaker / defibrillator leads are still in place.    Follow-Up: At [facility], you and your health needs are our priority.  As part of our continuing mission to provide you with exceptional heart care, we have created designated Provider Care Teams.  These Care Teams include your primary Cardiologist (physician) and Advanced Practice Providers (APPs -  Physician Assistants and Nurse Practitioners) who all work together to provide you with the care you need, when you need it.  We recommend signing up for the patient portal called "MyChart".  Sign up information is provided on this After Visit Summary.  MyChart is used to connect with patients for Virtual Visits (Telemedicine).  Patients are able to view lab/test results, encounter notes, upcoming appointments, etc.  Non-urgent messages can be sent to your provider as well.   To learn more about what you can do with MyChart, go to [email or web address].    Your next appointment:   9 months with Dr Olin Pia PA

## 2022-10-22 NOTE — Progress Notes (Unsigned)
Patient Care Team: Lucianne Lei, MD as PCP - General (Family Medicine) Woody Seller Sylvan Cheese, MD as Referring Physician (Cardiology) Lucianne Lei, MD (Family Medicine)   HPI  Linda Lowe is a 72 y.o. female Seen in followup for CRT-D [employer]  implanted 2014 in the setting of nonischemic cardiomyopathy. Gen change 10/23  She had been gone to the [place], intercurrent stroke,   Biggest complaint is SOB w even modest exertion at home with PT No edema; no chest pain       DATE TEST EF   12`/14 Echo   30-35 %   10/22 Echo  60-65%         Date Cr K Hgb  8/22   11.9<<8.8  7/23 0.79 4.6     12/23 0.87 4.1 12.6      Past Medical History:  Diagnosis Date   Arthritis    Cardiomyopathy ([place])    a. 05/2012 Echo: EF 30-35%, Gr 1 DD, mild LVH, mild MR, pericardial effusion   Chronic systolic heart failure ([place]) 08/26/2013   Diabetes mellitus    a. 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 [phone] [employer] 11/26/2013    St Jude    Pericardial effusion    a. 05/2012 Echo: circumferential pericardial effusion, mostly 40mm, largest pocket posterior - 77mm, mild RA indentation, no RV collapse, no tamponade.   Right patella fracture 07/08/2017   Stroke ([place]) 04/29/2018   Right hemiparesis in [place]    Past Surgical History:  Procedure Laterality Date   ABDOMINAL HYSTERECTOMY     BI-VENTRICULAR IMPLANTABLE CARDIOVERTER DEFIBRILLATOR N/A 12/01/2013   [employer] BI-VENTRICULAR IMPLANTABLE CARDIOVERTER DEFIBRILLATOR  (CRT-D);  Surgeon: Deboraha Sprang, MD;    BIOPSY  05/06/2021   Procedure: BIOPSY;  Surgeon: Daryel November, MD;  Location: [facility] ENDOSCOPY;  Service: Gastroenterology;;   BIV ICD Aurther Loft N/A 07/15/2022   Procedure: BIV ICD GENERATOR CHANGEOUT;  Surgeon: Deboraha Sprang, MD;  Location: [place] CV LAB;  Service: Cardiovascular;  Laterality: N/A;   COLONOSCOPY WITH PROPOFOL Left 05/06/2021   Procedure: COLONOSCOPY WITH PROPOFOL;  Surgeon: Daryel November, MD;  Location: [place];  Service: Gastroenterology;  Laterality: Left;   ESOPHAGOGASTRODUODENOSCOPY (EGD) WITH PROPOFOL Left 05/06/2021   Procedure: ESOPHAGOGASTRODUODENOSCOPY (EGD) WITH PROPOFOL;  Surgeon: Daryel November, MD;  Location: [place];  Service: Gastroenterology;  Laterality: Left;   FOOT SURGERY     LEFT AND RIGHT HEART CATHETERIZATION WITH CORONARY ANGIOGRAM  05/26/2012   Procedure: LEFT AND RIGHT HEART CATHETERIZATION WITH CORONARY ANGIOGRAM;  Surgeon: Sherren Mocha, MD;  Location: [facility] CATH LAB;  Service: Cardiovascular;;   ORIF PATELLA Right 07/08/2017   Procedure: OPEN REDUCTION INTERNAL (ORIF) FIXATION PATELLA;  Surgeon: Renette Butters, MD;  Location: [place];  Service: Orthopedics;  Laterality: Right;    Current Outpatient Medications  Medication Sig Dispense Refill   acetaminophen (TYLENOL) 500 MG tablet Take 1,000 mg by mouth every 6 (six) hours as needed for moderate pain.     amLODipine (NORVASC) 5 MG tablet Take 5 mg by mouth daily.     atorvastatin (LIPITOR) 80 MG tablet TAKE 1 TABLET(80 MG) BY MOUTH DAILY 90 tablet 2   B-D UF III MINI PEN NEEDLES 31G X 5 MM MISC USE 5 NEEDLES EVERY DAY  AS DIRECTED 200 each 0   carvedilol (COREG) 25 MG tablet Take 25 mg by mouth 2 (two) times daily.  4   citalopram (CELEXA) 10 MG tablet Take 20 mg by mouth daily.     Continuous Blood Gluc Receiver (FREESTYLE LIBRE 2 READER) DEVI      Continuous Blood Gluc Sensor (FREESTYLE LIBRE 2 SENSOR) MISC 4 (four) times daily.     diclofenac Sodium (VOLTAREN) 1 % GEL Apply 1 Application topically 2 (two) times daily.     diphenhydrAMINE (BENADRYL) 25 MG tablet Take 25 mg by mouth daily as needed for allergies.     ferrous sulfate 325 (65 FE) MG tablet Take 1 tablet (325 mg total) by mouth 2 (two) times daily  with a meal. 60 tablet 1   furosemide (LASIX) 40 MG tablet Take 1 tablet (40 mg total) by mouth daily as needed for fluid. 30 tablet 3   hydrALAZINE (APRESOLINE) 25 MG tablet Take 25 mg by mouth 3 (three) times daily.     insulin lispro (HUMALOG KWIKPEN) 100 UNIT/ML KwikPen Inject 12-15 Units into the skin 2 (two) times daily as needed (high blood sugar).     Insulin Pen Needle (B-D UF III MINI PEN NEEDLES) 31G X 5 MM MISC USE 5 PEN NEEDLES PER DAY AS INSTRUCTED 150 each 10   Insulin Pen Needle (B-D UF III MINI PEN NEEDLES) 31G X 5 MM MISC USE 5 NEEDLES EVERY DAY AS DIRECTED 200 each 0   irbesartan (AVAPRO) 300 MG tablet TAKE 1 TABLET(300 MG) BY MOUTH DAILY 90 tablet 0   isosorbide dinitrate (ISORDIL) 30 MG tablet Take 30 mg by mouth 3 (three) times daily.     lidocaine (LIDODERM) 5 % Place 1 patch onto the skin 2 (two) times daily.     NOVOLOG FLEXPEN 100 UNIT/ML FlexPen ADMINISTER 16 UNITS UNDER THE SKIN TWICE DAILY BEFORE LUNCH AND SUPPER 15 mL 0   OZEMPIC, 0.25 OR 0.5 MG/DOSE, 2 MG/3ML SOPN Inject 0.5 mg into the skin every Monday.     potassium chloride SA (KLOR-CON M) 20 MEQ tablet Take 1 tablet (20 mEq total) by mouth daily. 30 tablet 1   tizanidine (ZANAFLEX) 2 MG capsule Take 1 capsule (2 mg total) by mouth 3 (three) times daily as needed for muscle spasms. 15 capsule 0   TRESIBA FLEXTOUCH 200 UNIT/ML FlexTouch Pen Inject 44 Units into the skin daily.     No current facility-administered medications for this visit.    No Known Allergies  Review of Systems negative except from HPI and PMH  Physical Exam BP (!) 100/56   Pulse 65   Ht 5' 4.5" (1.638 m)   Wt 165 lb (74.8 kg)   SpO2 93%   BMI 27.88 kg/m  Well developed and well nourished in no acute distress HENT normal Neck supple with JVP-flat Crackles Device pocket well healed; without hematoma or erythema.  There is no tethering  Regular rate and rhythm, no murmur Abd-soft with active BS No Clubbing cyanosis   edema Skin-warm and dry A & Oriented  non ambulatory  ECG AV pacing upright QRS lead V1 and neg QRS lead 1  Device function is normal. Programming changes   See Paceart for details        Assessment and  Plan  Nonischemic cardiomyopathy and recovered   Congestive heart failure-chronic-diastolic  Defibrillator-CRT-St. Jude      Stroke intercurrent  Hypertension  Shortness of breath  Device generator replacement, biggest complaint is  shortness of breath.  With exertion.  With her recovered systolic function, she may well still be a candidate for spironolactone and SGLT2 will defer to [place] and VB  She had crackles on exam, this not withstanding a flat core view, we will get a chest x-ray.

## 2022-10-23 DIAGNOSIS — G8191 Hemiplegia, unspecified affecting right dominant side: Secondary | ICD-10-CM | POA: Diagnosis not present

## 2022-10-23 DIAGNOSIS — I110 Hypertensive heart disease with heart failure: Secondary | ICD-10-CM | POA: Diagnosis not present

## 2022-10-23 DIAGNOSIS — E7800 Pure hypercholesterolemia, unspecified: Secondary | ICD-10-CM | POA: Diagnosis not present

## 2022-10-23 DIAGNOSIS — M069 Rheumatoid arthritis, unspecified: Secondary | ICD-10-CM | POA: Diagnosis not present

## 2022-10-23 DIAGNOSIS — Z95810 Presence of automatic (implantable) cardiac defibrillator: Secondary | ICD-10-CM | POA: Diagnosis not present

## 2022-10-23 DIAGNOSIS — I5042 Chronic combined systolic (congestive) and diastolic (congestive) heart failure: Secondary | ICD-10-CM | POA: Diagnosis not present

## 2022-10-23 DIAGNOSIS — Z794 Long term (current) use of insulin: Secondary | ICD-10-CM | POA: Diagnosis not present

## 2022-10-23 DIAGNOSIS — Z7985 Long-term (current) use of injectable non-insulin antidiabetic drugs: Secondary | ICD-10-CM | POA: Diagnosis not present

## 2022-10-23 DIAGNOSIS — I428 Other cardiomyopathies: Secondary | ICD-10-CM | POA: Diagnosis not present

## 2022-10-23 DIAGNOSIS — E119 Type 2 diabetes mellitus without complications: Secondary | ICD-10-CM | POA: Diagnosis not present

## 2022-10-25 DIAGNOSIS — M069 Rheumatoid arthritis, unspecified: Secondary | ICD-10-CM | POA: Diagnosis not present

## 2022-10-25 DIAGNOSIS — Z7985 Long-term (current) use of injectable non-insulin antidiabetic drugs: Secondary | ICD-10-CM | POA: Diagnosis not present

## 2022-10-25 DIAGNOSIS — I110 Hypertensive heart disease with heart failure: Secondary | ICD-10-CM | POA: Diagnosis not present

## 2022-10-25 DIAGNOSIS — I428 Other cardiomyopathies: Secondary | ICD-10-CM | POA: Diagnosis not present

## 2022-10-25 DIAGNOSIS — I5042 Chronic combined systolic (congestive) and diastolic (congestive) heart failure: Secondary | ICD-10-CM | POA: Diagnosis not present

## 2022-10-25 DIAGNOSIS — G8191 Hemiplegia, unspecified affecting right dominant side: Secondary | ICD-10-CM | POA: Diagnosis not present

## 2022-10-25 DIAGNOSIS — Z794 Long term (current) use of insulin: Secondary | ICD-10-CM | POA: Diagnosis not present

## 2022-10-25 DIAGNOSIS — Z95810 Presence of automatic (implantable) cardiac defibrillator: Secondary | ICD-10-CM | POA: Diagnosis not present

## 2022-10-25 DIAGNOSIS — E119 Type 2 diabetes mellitus without complications: Secondary | ICD-10-CM | POA: Diagnosis not present

## 2022-10-25 DIAGNOSIS — E7800 Pure hypercholesterolemia, unspecified: Secondary | ICD-10-CM | POA: Diagnosis not present

## 2022-10-28 DIAGNOSIS — E7800 Pure hypercholesterolemia, unspecified: Secondary | ICD-10-CM | POA: Diagnosis not present

## 2022-10-28 DIAGNOSIS — Z794 Long term (current) use of insulin: Secondary | ICD-10-CM | POA: Diagnosis not present

## 2022-10-28 DIAGNOSIS — Z95810 Presence of automatic (implantable) cardiac defibrillator: Secondary | ICD-10-CM | POA: Diagnosis not present

## 2022-10-28 DIAGNOSIS — E119 Type 2 diabetes mellitus without complications: Secondary | ICD-10-CM | POA: Diagnosis not present

## 2022-10-28 DIAGNOSIS — G8191 Hemiplegia, unspecified affecting right dominant side: Secondary | ICD-10-CM | POA: Diagnosis not present

## 2022-10-28 DIAGNOSIS — I110 Hypertensive heart disease with heart failure: Secondary | ICD-10-CM | POA: Diagnosis not present

## 2022-10-28 DIAGNOSIS — I428 Other cardiomyopathies: Secondary | ICD-10-CM | POA: Diagnosis not present

## 2022-10-28 DIAGNOSIS — Z7985 Long-term (current) use of injectable non-insulin antidiabetic drugs: Secondary | ICD-10-CM | POA: Diagnosis not present

## 2022-10-28 DIAGNOSIS — M069 Rheumatoid arthritis, unspecified: Secondary | ICD-10-CM | POA: Diagnosis not present

## 2022-10-28 DIAGNOSIS — I5042 Chronic combined systolic (congestive) and diastolic (congestive) heart failure: Secondary | ICD-10-CM | POA: Diagnosis not present

## 2022-10-29 DIAGNOSIS — E7800 Pure hypercholesterolemia, unspecified: Secondary | ICD-10-CM | POA: Diagnosis not present

## 2022-10-29 DIAGNOSIS — I428 Other cardiomyopathies: Secondary | ICD-10-CM | POA: Diagnosis not present

## 2022-10-29 DIAGNOSIS — E119 Type 2 diabetes mellitus without complications: Secondary | ICD-10-CM | POA: Diagnosis not present

## 2022-10-29 DIAGNOSIS — I110 Hypertensive heart disease with heart failure: Secondary | ICD-10-CM | POA: Diagnosis not present

## 2022-10-29 DIAGNOSIS — G8191 Hemiplegia, unspecified affecting right dominant side: Secondary | ICD-10-CM | POA: Diagnosis not present

## 2022-10-29 DIAGNOSIS — Z794 Long term (current) use of insulin: Secondary | ICD-10-CM | POA: Diagnosis not present

## 2022-10-29 DIAGNOSIS — Z7985 Long-term (current) use of injectable non-insulin antidiabetic drugs: Secondary | ICD-10-CM | POA: Diagnosis not present

## 2022-10-29 DIAGNOSIS — Z95810 Presence of automatic (implantable) cardiac defibrillator: Secondary | ICD-10-CM | POA: Diagnosis not present

## 2022-10-29 DIAGNOSIS — I5042 Chronic combined systolic (congestive) and diastolic (congestive) heart failure: Secondary | ICD-10-CM | POA: Diagnosis not present

## 2022-10-29 DIAGNOSIS — M069 Rheumatoid arthritis, unspecified: Secondary | ICD-10-CM | POA: Diagnosis not present

## 2022-11-01 ENCOUNTER — Telehealth: Payer: Self-pay

## 2022-11-01 DIAGNOSIS — I428 Other cardiomyopathies: Secondary | ICD-10-CM | POA: Diagnosis not present

## 2022-11-01 DIAGNOSIS — E7800 Pure hypercholesterolemia, unspecified: Secondary | ICD-10-CM | POA: Diagnosis not present

## 2022-11-01 DIAGNOSIS — I110 Hypertensive heart disease with heart failure: Secondary | ICD-10-CM | POA: Diagnosis not present

## 2022-11-01 DIAGNOSIS — M069 Rheumatoid arthritis, unspecified: Secondary | ICD-10-CM | POA: Diagnosis not present

## 2022-11-01 DIAGNOSIS — G8191 Hemiplegia, unspecified affecting right dominant side: Secondary | ICD-10-CM | POA: Diagnosis not present

## 2022-11-01 DIAGNOSIS — Z7985 Long-term (current) use of injectable non-insulin antidiabetic drugs: Secondary | ICD-10-CM | POA: Diagnosis not present

## 2022-11-01 DIAGNOSIS — I5042 Chronic combined systolic (congestive) and diastolic (congestive) heart failure: Secondary | ICD-10-CM | POA: Diagnosis not present

## 2022-11-01 DIAGNOSIS — Z794 Long term (current) use of insulin: Secondary | ICD-10-CM | POA: Diagnosis not present

## 2022-11-01 DIAGNOSIS — E119 Type 2 diabetes mellitus without complications: Secondary | ICD-10-CM | POA: Diagnosis not present

## 2022-11-01 DIAGNOSIS — Z95810 Presence of automatic (implantable) cardiac defibrillator: Secondary | ICD-10-CM | POA: Diagnosis not present

## 2022-11-01 NOTE — Telephone Encounter (Signed)
Sherise from home health called and asked for verbal orders for  1 week a month for 4 weeks.  [phone]

## 2022-11-01 NOTE — Telephone Encounter (Signed)
Please resume [place]

## 2022-11-04 ENCOUNTER — Telehealth: Payer: Self-pay | Admitting: Internal Medicine

## 2022-11-04 DIAGNOSIS — Z7985 Long-term (current) use of injectable non-insulin antidiabetic drugs: Secondary | ICD-10-CM | POA: Diagnosis not present

## 2022-11-04 DIAGNOSIS — Z95810 Presence of automatic (implantable) cardiac defibrillator: Secondary | ICD-10-CM | POA: Diagnosis not present

## 2022-11-04 DIAGNOSIS — E119 Type 2 diabetes mellitus without complications: Secondary | ICD-10-CM | POA: Diagnosis not present

## 2022-11-04 DIAGNOSIS — R0602 Shortness of breath: Secondary | ICD-10-CM

## 2022-11-04 DIAGNOSIS — Z794 Long term (current) use of insulin: Secondary | ICD-10-CM | POA: Diagnosis not present

## 2022-11-04 DIAGNOSIS — I5042 Chronic combined systolic (congestive) and diastolic (congestive) heart failure: Secondary | ICD-10-CM | POA: Diagnosis not present

## 2022-11-04 DIAGNOSIS — I110 Hypertensive heart disease with heart failure: Secondary | ICD-10-CM | POA: Diagnosis not present

## 2022-11-04 DIAGNOSIS — I428 Other cardiomyopathies: Secondary | ICD-10-CM | POA: Diagnosis not present

## 2022-11-04 DIAGNOSIS — M069 Rheumatoid arthritis, unspecified: Secondary | ICD-10-CM | POA: Diagnosis not present

## 2022-11-04 DIAGNOSIS — E7800 Pure hypercholesterolemia, unspecified: Secondary | ICD-10-CM | POA: Diagnosis not present

## 2022-11-04 DIAGNOSIS — G8191 Hemiplegia, unspecified affecting right dominant side: Secondary | ICD-10-CM | POA: Diagnosis not present

## 2022-11-04 NOTE — Telephone Encounter (Signed)
Husband is calling in bout the CT that was suppose sent over for his wife. Please advise

## 2022-11-04 NOTE — Telephone Encounter (Signed)
Per pt okay to speak with husband Husband was calling to confirm address for [facility] There are 2 locations [address] Pt's husband aware .Adonis Housekeeper

## 2022-11-05 ENCOUNTER — Ambulatory Visit
Admission: RE | Admit: 2022-11-05 | Discharge: 2022-11-05 | Disposition: A | Discharge status: Home / Self Care | Payer: Medicare Other | Source: Ambulatory Visit | Attending: Internal Medicine | Admitting: Internal Medicine

## 2022-11-05 ENCOUNTER — Telehealth: Payer: Self-pay | Admitting: Cardiology

## 2022-11-05 DIAGNOSIS — E119 Type 2 diabetes mellitus without complications: Secondary | ICD-10-CM

## 2022-11-05 DIAGNOSIS — G8191 Hemiplegia, unspecified affecting right dominant side: Secondary | ICD-10-CM | POA: Diagnosis not present

## 2022-11-05 DIAGNOSIS — R0602 Shortness of breath: Secondary | ICD-10-CM | POA: Diagnosis not present

## 2022-11-05 DIAGNOSIS — I428 Other cardiomyopathies: Secondary | ICD-10-CM | POA: Diagnosis not present

## 2022-11-05 NOTE — Telephone Encounter (Signed)
ICD-10-CM   1. Right hemiplegia ([place])  G81.91     2. Nonischemic cardiomyopathy (HCC)  I42.8     3. Type 2 diabetes mellitus without complication, with long-term current use of insulin (HCC)  E11.9    Z79.4      I have signed off on home health plans of care.

## 2022-11-06 DIAGNOSIS — Z7985 Long-term (current) use of injectable non-insulin antidiabetic drugs: Secondary | ICD-10-CM | POA: Diagnosis not present

## 2022-11-06 DIAGNOSIS — Z95810 Presence of automatic (implantable) cardiac defibrillator: Secondary | ICD-10-CM | POA: Diagnosis not present

## 2022-11-06 DIAGNOSIS — Z794 Long term (current) use of insulin: Secondary | ICD-10-CM | POA: Diagnosis not present

## 2022-11-06 DIAGNOSIS — I110 Hypertensive heart disease with heart failure: Secondary | ICD-10-CM | POA: Diagnosis not present

## 2022-11-06 DIAGNOSIS — G8191 Hemiplegia, unspecified affecting right dominant side: Secondary | ICD-10-CM | POA: Diagnosis not present

## 2022-11-06 DIAGNOSIS — I428 Other cardiomyopathies: Secondary | ICD-10-CM | POA: Diagnosis not present

## 2022-11-06 DIAGNOSIS — E7800 Pure hypercholesterolemia, unspecified: Secondary | ICD-10-CM | POA: Diagnosis not present

## 2022-11-06 DIAGNOSIS — I5042 Chronic combined systolic (congestive) and diastolic (congestive) heart failure: Secondary | ICD-10-CM | POA: Diagnosis not present

## 2022-11-06 DIAGNOSIS — E119 Type 2 diabetes mellitus without complications: Secondary | ICD-10-CM | POA: Diagnosis not present

## 2022-11-06 DIAGNOSIS — M069 Rheumatoid arthritis, unspecified: Secondary | ICD-10-CM | POA: Diagnosis not present

## 2022-11-07 DIAGNOSIS — I428 Other cardiomyopathies: Secondary | ICD-10-CM | POA: Diagnosis not present

## 2022-11-07 DIAGNOSIS — I110 Hypertensive heart disease with heart failure: Secondary | ICD-10-CM | POA: Diagnosis not present

## 2022-11-07 DIAGNOSIS — E7800 Pure hypercholesterolemia, unspecified: Secondary | ICD-10-CM | POA: Diagnosis not present

## 2022-11-07 DIAGNOSIS — Z95810 Presence of automatic (implantable) cardiac defibrillator: Secondary | ICD-10-CM | POA: Diagnosis not present

## 2022-11-07 DIAGNOSIS — I5042 Chronic combined systolic (congestive) and diastolic (congestive) heart failure: Secondary | ICD-10-CM | POA: Diagnosis not present

## 2022-11-07 DIAGNOSIS — G8191 Hemiplegia, unspecified affecting right dominant side: Secondary | ICD-10-CM | POA: Diagnosis not present

## 2022-11-07 DIAGNOSIS — Z794 Long term (current) use of insulin: Secondary | ICD-10-CM | POA: Diagnosis not present

## 2022-11-07 DIAGNOSIS — Z7985 Long-term (current) use of injectable non-insulin antidiabetic drugs: Secondary | ICD-10-CM | POA: Diagnosis not present

## 2022-11-07 DIAGNOSIS — E119 Type 2 diabetes mellitus without complications: Secondary | ICD-10-CM | POA: Diagnosis not present

## 2022-11-07 DIAGNOSIS — M069 Rheumatoid arthritis, unspecified: Secondary | ICD-10-CM | POA: Diagnosis not present

## 2022-11-11 ENCOUNTER — Telehealth: Payer: Self-pay | Admitting: Internal Medicine

## 2022-11-11 DIAGNOSIS — M25511 Pain in right shoulder: Secondary | ICD-10-CM | POA: Diagnosis not present

## 2022-11-11 NOTE — Telephone Encounter (Signed)
Husband calling for patient's results. Please advsie

## 2022-11-11 NOTE — Telephone Encounter (Signed)
Spoke with pt and advised Dr Caryl Comes has not yet reviewed chest xray.  Once he has done this we will contact with results and any recommendations.  Pt verbalizes understanding and thanked Therapist, sports for the call.

## 2022-11-12 DIAGNOSIS — G8191 Hemiplegia, unspecified affecting right dominant side: Secondary | ICD-10-CM | POA: Diagnosis not present

## 2022-11-12 DIAGNOSIS — M069 Rheumatoid arthritis, unspecified: Secondary | ICD-10-CM | POA: Diagnosis not present

## 2022-11-12 DIAGNOSIS — I5042 Chronic combined systolic (congestive) and diastolic (congestive) heart failure: Secondary | ICD-10-CM | POA: Diagnosis not present

## 2022-11-12 DIAGNOSIS — I110 Hypertensive heart disease with heart failure: Secondary | ICD-10-CM | POA: Diagnosis not present

## 2022-11-12 DIAGNOSIS — E7800 Pure hypercholesterolemia, unspecified: Secondary | ICD-10-CM | POA: Diagnosis not present

## 2022-11-12 DIAGNOSIS — E119 Type 2 diabetes mellitus without complications: Secondary | ICD-10-CM | POA: Diagnosis not present

## 2022-11-12 DIAGNOSIS — Z794 Long term (current) use of insulin: Secondary | ICD-10-CM | POA: Diagnosis not present

## 2022-11-12 DIAGNOSIS — I428 Other cardiomyopathies: Secondary | ICD-10-CM | POA: Diagnosis not present

## 2022-11-12 DIAGNOSIS — Z7985 Long-term (current) use of injectable non-insulin antidiabetic drugs: Secondary | ICD-10-CM | POA: Diagnosis not present

## 2022-11-12 DIAGNOSIS — Z95810 Presence of automatic (implantable) cardiac defibrillator: Secondary | ICD-10-CM | POA: Diagnosis not present

## 2022-11-13 DIAGNOSIS — I428 Other cardiomyopathies: Secondary | ICD-10-CM | POA: Diagnosis not present

## 2022-11-13 DIAGNOSIS — Z794 Long term (current) use of insulin: Secondary | ICD-10-CM | POA: Diagnosis not present

## 2022-11-13 DIAGNOSIS — I5042 Chronic combined systolic (congestive) and diastolic (congestive) heart failure: Secondary | ICD-10-CM | POA: Diagnosis not present

## 2022-11-13 DIAGNOSIS — Z95810 Presence of automatic (implantable) cardiac defibrillator: Secondary | ICD-10-CM | POA: Diagnosis not present

## 2022-11-13 DIAGNOSIS — E119 Type 2 diabetes mellitus without complications: Secondary | ICD-10-CM | POA: Diagnosis not present

## 2022-11-13 DIAGNOSIS — I110 Hypertensive heart disease with heart failure: Secondary | ICD-10-CM | POA: Diagnosis not present

## 2022-11-13 DIAGNOSIS — E7800 Pure hypercholesterolemia, unspecified: Secondary | ICD-10-CM | POA: Diagnosis not present

## 2022-11-13 DIAGNOSIS — G8191 Hemiplegia, unspecified affecting right dominant side: Secondary | ICD-10-CM | POA: Diagnosis not present

## 2022-11-13 DIAGNOSIS — M069 Rheumatoid arthritis, unspecified: Secondary | ICD-10-CM | POA: Diagnosis not present

## 2022-11-13 DIAGNOSIS — Z7985 Long-term (current) use of injectable non-insulin antidiabetic drugs: Secondary | ICD-10-CM | POA: Diagnosis not present

## 2022-11-14 DIAGNOSIS — I5042 Chronic combined systolic (congestive) and diastolic (congestive) heart failure: Secondary | ICD-10-CM | POA: Diagnosis not present

## 2022-11-14 DIAGNOSIS — I110 Hypertensive heart disease with heart failure: Secondary | ICD-10-CM | POA: Diagnosis not present

## 2022-11-14 DIAGNOSIS — Z7985 Long-term (current) use of injectable non-insulin antidiabetic drugs: Secondary | ICD-10-CM | POA: Diagnosis not present

## 2022-11-14 DIAGNOSIS — Z794 Long term (current) use of insulin: Secondary | ICD-10-CM | POA: Diagnosis not present

## 2022-11-14 DIAGNOSIS — G8191 Hemiplegia, unspecified affecting right dominant side: Secondary | ICD-10-CM | POA: Diagnosis not present

## 2022-11-14 DIAGNOSIS — E119 Type 2 diabetes mellitus without complications: Secondary | ICD-10-CM | POA: Diagnosis not present

## 2022-11-14 DIAGNOSIS — E7800 Pure hypercholesterolemia, unspecified: Secondary | ICD-10-CM | POA: Diagnosis not present

## 2022-11-14 DIAGNOSIS — Z95810 Presence of automatic (implantable) cardiac defibrillator: Secondary | ICD-10-CM | POA: Diagnosis not present

## 2022-11-14 DIAGNOSIS — I428 Other cardiomyopathies: Secondary | ICD-10-CM | POA: Diagnosis not present

## 2022-11-14 DIAGNOSIS — M069 Rheumatoid arthritis, unspecified: Secondary | ICD-10-CM | POA: Diagnosis not present

## 2022-11-17 DIAGNOSIS — I5022 Chronic systolic (congestive) heart failure: Secondary | ICD-10-CM | POA: Diagnosis not present

## 2022-11-17 DIAGNOSIS — Z95810 Presence of automatic (implantable) cardiac defibrillator: Secondary | ICD-10-CM | POA: Diagnosis not present

## 2022-11-17 DIAGNOSIS — Z4502 Encounter for adjustment and management of automatic implantable cardiac defibrillator: Secondary | ICD-10-CM | POA: Diagnosis not present

## 2022-11-18 DIAGNOSIS — I5042 Chronic combined systolic (congestive) and diastolic (congestive) heart failure: Secondary | ICD-10-CM | POA: Diagnosis not present

## 2022-11-18 DIAGNOSIS — E7800 Pure hypercholesterolemia, unspecified: Secondary | ICD-10-CM | POA: Diagnosis not present

## 2022-11-18 DIAGNOSIS — Z95810 Presence of automatic (implantable) cardiac defibrillator: Secondary | ICD-10-CM | POA: Diagnosis not present

## 2022-11-18 DIAGNOSIS — I428 Other cardiomyopathies: Secondary | ICD-10-CM | POA: Diagnosis not present

## 2022-11-18 DIAGNOSIS — G8191 Hemiplegia, unspecified affecting right dominant side: Secondary | ICD-10-CM | POA: Diagnosis not present

## 2022-11-18 DIAGNOSIS — E119 Type 2 diabetes mellitus without complications: Secondary | ICD-10-CM | POA: Diagnosis not present

## 2022-11-18 DIAGNOSIS — M069 Rheumatoid arthritis, unspecified: Secondary | ICD-10-CM | POA: Diagnosis not present

## 2022-11-18 DIAGNOSIS — Z7985 Long-term (current) use of injectable non-insulin antidiabetic drugs: Secondary | ICD-10-CM | POA: Diagnosis not present

## 2022-11-18 DIAGNOSIS — Z794 Long term (current) use of insulin: Secondary | ICD-10-CM | POA: Diagnosis not present

## 2022-11-18 DIAGNOSIS — I110 Hypertensive heart disease with heart failure: Secondary | ICD-10-CM | POA: Diagnosis not present

## 2022-11-19 ENCOUNTER — Other Ambulatory Visit: Payer: Self-pay | Admitting: Cardiology

## 2022-11-20 DIAGNOSIS — Z7985 Long-term (current) use of injectable non-insulin antidiabetic drugs: Secondary | ICD-10-CM | POA: Diagnosis not present

## 2022-11-20 DIAGNOSIS — I428 Other cardiomyopathies: Secondary | ICD-10-CM | POA: Diagnosis not present

## 2022-11-20 DIAGNOSIS — Z95810 Presence of automatic (implantable) cardiac defibrillator: Secondary | ICD-10-CM | POA: Diagnosis not present

## 2022-11-20 DIAGNOSIS — I110 Hypertensive heart disease with heart failure: Secondary | ICD-10-CM | POA: Diagnosis not present

## 2022-11-20 DIAGNOSIS — Z794 Long term (current) use of insulin: Secondary | ICD-10-CM | POA: Diagnosis not present

## 2022-11-20 DIAGNOSIS — I5042 Chronic combined systolic (congestive) and diastolic (congestive) heart failure: Secondary | ICD-10-CM | POA: Diagnosis not present

## 2022-11-20 DIAGNOSIS — G8191 Hemiplegia, unspecified affecting right dominant side: Secondary | ICD-10-CM | POA: Diagnosis not present

## 2022-11-20 DIAGNOSIS — E119 Type 2 diabetes mellitus without complications: Secondary | ICD-10-CM | POA: Diagnosis not present

## 2022-11-20 DIAGNOSIS — M069 Rheumatoid arthritis, unspecified: Secondary | ICD-10-CM | POA: Diagnosis not present

## 2022-11-20 DIAGNOSIS — E7800 Pure hypercholesterolemia, unspecified: Secondary | ICD-10-CM | POA: Diagnosis not present

## 2022-11-21 ENCOUNTER — Telehealth: Payer: Self-pay | Admitting: Internal Medicine

## 2022-11-21 DIAGNOSIS — I5042 Chronic combined systolic (congestive) and diastolic (congestive) heart failure: Secondary | ICD-10-CM | POA: Diagnosis not present

## 2022-11-21 DIAGNOSIS — M069 Rheumatoid arthritis, unspecified: Secondary | ICD-10-CM | POA: Diagnosis not present

## 2022-11-21 DIAGNOSIS — G8191 Hemiplegia, unspecified affecting right dominant side: Secondary | ICD-10-CM | POA: Diagnosis not present

## 2022-11-21 DIAGNOSIS — Z95810 Presence of automatic (implantable) cardiac defibrillator: Secondary | ICD-10-CM | POA: Diagnosis not present

## 2022-11-21 DIAGNOSIS — I428 Other cardiomyopathies: Secondary | ICD-10-CM | POA: Diagnosis not present

## 2022-11-21 DIAGNOSIS — Z7985 Long-term (current) use of injectable non-insulin antidiabetic drugs: Secondary | ICD-10-CM | POA: Diagnosis not present

## 2022-11-21 DIAGNOSIS — E119 Type 2 diabetes mellitus without complications: Secondary | ICD-10-CM | POA: Diagnosis not present

## 2022-11-21 DIAGNOSIS — Z794 Long term (current) use of insulin: Secondary | ICD-10-CM | POA: Diagnosis not present

## 2022-11-21 DIAGNOSIS — I110 Hypertensive heart disease with heart failure: Secondary | ICD-10-CM | POA: Diagnosis not present

## 2022-11-21 DIAGNOSIS — E7800 Pure hypercholesterolemia, unspecified: Secondary | ICD-10-CM | POA: Diagnosis not present

## 2022-11-21 NOTE — Telephone Encounter (Signed)
Pt called asking for echo test results. Dr. Caryl Comes has not finalized the results, advised pt and her husband we will call with results.

## 2022-11-21 NOTE — Telephone Encounter (Signed)
Patient called to follow-up on test results.

## 2022-11-22 NOTE — Telephone Encounter (Signed)
Left a message to call back.

## 2022-11-22 NOTE — Telephone Encounter (Signed)
Spoke with both patient and husband.  Advised pt has not had a a recent echo here.  Per husband, they were calling about the results of her CXR.  Advised CXR from 11/05/22   Per Dr Caryl Comes:  Deboraha Sprang, MD  Emily Filbert, RN; Thora Lance, RN Please Inform Patient that study was normal   Thanks  Husband states understanding.

## 2022-11-22 NOTE — Telephone Encounter (Signed)
Pt is returning call.  

## 2022-11-25 DIAGNOSIS — I5042 Chronic combined systolic (congestive) and diastolic (congestive) heart failure: Secondary | ICD-10-CM | POA: Diagnosis not present

## 2022-11-25 DIAGNOSIS — I428 Other cardiomyopathies: Secondary | ICD-10-CM | POA: Diagnosis not present

## 2022-11-25 DIAGNOSIS — Z794 Long term (current) use of insulin: Secondary | ICD-10-CM | POA: Diagnosis not present

## 2022-11-25 DIAGNOSIS — Z95810 Presence of automatic (implantable) cardiac defibrillator: Secondary | ICD-10-CM | POA: Diagnosis not present

## 2022-11-25 DIAGNOSIS — M069 Rheumatoid arthritis, unspecified: Secondary | ICD-10-CM | POA: Diagnosis not present

## 2022-11-25 DIAGNOSIS — I110 Hypertensive heart disease with heart failure: Secondary | ICD-10-CM | POA: Diagnosis not present

## 2022-11-25 DIAGNOSIS — E119 Type 2 diabetes mellitus without complications: Secondary | ICD-10-CM | POA: Diagnosis not present

## 2022-11-25 DIAGNOSIS — G8191 Hemiplegia, unspecified affecting right dominant side: Secondary | ICD-10-CM | POA: Diagnosis not present

## 2022-11-25 DIAGNOSIS — Z7985 Long-term (current) use of injectable non-insulin antidiabetic drugs: Secondary | ICD-10-CM | POA: Diagnosis not present

## 2022-11-25 DIAGNOSIS — E7800 Pure hypercholesterolemia, unspecified: Secondary | ICD-10-CM | POA: Diagnosis not present

## 2022-11-29 DIAGNOSIS — I428 Other cardiomyopathies: Secondary | ICD-10-CM | POA: Diagnosis not present

## 2022-11-29 DIAGNOSIS — G8191 Hemiplegia, unspecified affecting right dominant side: Secondary | ICD-10-CM | POA: Diagnosis not present

## 2022-11-29 DIAGNOSIS — Z95810 Presence of automatic (implantable) cardiac defibrillator: Secondary | ICD-10-CM | POA: Diagnosis not present

## 2022-11-29 DIAGNOSIS — I110 Hypertensive heart disease with heart failure: Secondary | ICD-10-CM | POA: Diagnosis not present

## 2022-11-29 DIAGNOSIS — E7800 Pure hypercholesterolemia, unspecified: Secondary | ICD-10-CM | POA: Diagnosis not present

## 2022-11-29 DIAGNOSIS — I5042 Chronic combined systolic (congestive) and diastolic (congestive) heart failure: Secondary | ICD-10-CM | POA: Diagnosis not present

## 2022-11-29 DIAGNOSIS — Z794 Long term (current) use of insulin: Secondary | ICD-10-CM | POA: Diagnosis not present

## 2022-11-29 DIAGNOSIS — E119 Type 2 diabetes mellitus without complications: Secondary | ICD-10-CM | POA: Diagnosis not present

## 2022-11-29 DIAGNOSIS — Z7985 Long-term (current) use of injectable non-insulin antidiabetic drugs: Secondary | ICD-10-CM | POA: Diagnosis not present

## 2022-11-29 DIAGNOSIS — M069 Rheumatoid arthritis, unspecified: Secondary | ICD-10-CM | POA: Diagnosis not present

## 2022-12-03 DIAGNOSIS — I428 Other cardiomyopathies: Secondary | ICD-10-CM | POA: Diagnosis not present

## 2022-12-03 DIAGNOSIS — G8191 Hemiplegia, unspecified affecting right dominant side: Secondary | ICD-10-CM | POA: Diagnosis not present

## 2022-12-03 DIAGNOSIS — Z95810 Presence of automatic (implantable) cardiac defibrillator: Secondary | ICD-10-CM | POA: Diagnosis not present

## 2022-12-03 DIAGNOSIS — Z794 Long term (current) use of insulin: Secondary | ICD-10-CM | POA: Diagnosis not present

## 2022-12-03 DIAGNOSIS — E119 Type 2 diabetes mellitus without complications: Secondary | ICD-10-CM | POA: Diagnosis not present

## 2022-12-03 DIAGNOSIS — M069 Rheumatoid arthritis, unspecified: Secondary | ICD-10-CM | POA: Diagnosis not present

## 2022-12-03 DIAGNOSIS — I110 Hypertensive heart disease with heart failure: Secondary | ICD-10-CM | POA: Diagnosis not present

## 2022-12-03 DIAGNOSIS — I5042 Chronic combined systolic (congestive) and diastolic (congestive) heart failure: Secondary | ICD-10-CM | POA: Diagnosis not present

## 2022-12-03 DIAGNOSIS — Z7985 Long-term (current) use of injectable non-insulin antidiabetic drugs: Secondary | ICD-10-CM | POA: Diagnosis not present

## 2022-12-03 DIAGNOSIS — E7800 Pure hypercholesterolemia, unspecified: Secondary | ICD-10-CM | POA: Diagnosis not present

## 2022-12-05 DIAGNOSIS — M069 Rheumatoid arthritis, unspecified: Secondary | ICD-10-CM | POA: Diagnosis not present

## 2022-12-05 DIAGNOSIS — I428 Other cardiomyopathies: Secondary | ICD-10-CM | POA: Diagnosis not present

## 2022-12-05 DIAGNOSIS — I5042 Chronic combined systolic (congestive) and diastolic (congestive) heart failure: Secondary | ICD-10-CM | POA: Diagnosis not present

## 2022-12-05 DIAGNOSIS — Z794 Long term (current) use of insulin: Secondary | ICD-10-CM | POA: Diagnosis not present

## 2022-12-05 DIAGNOSIS — Z95810 Presence of automatic (implantable) cardiac defibrillator: Secondary | ICD-10-CM | POA: Diagnosis not present

## 2022-12-05 DIAGNOSIS — I110 Hypertensive heart disease with heart failure: Secondary | ICD-10-CM | POA: Diagnosis not present

## 2022-12-05 DIAGNOSIS — E7800 Pure hypercholesterolemia, unspecified: Secondary | ICD-10-CM | POA: Diagnosis not present

## 2022-12-05 DIAGNOSIS — Z7985 Long-term (current) use of injectable non-insulin antidiabetic drugs: Secondary | ICD-10-CM | POA: Diagnosis not present

## 2022-12-05 DIAGNOSIS — G8191 Hemiplegia, unspecified affecting right dominant side: Secondary | ICD-10-CM | POA: Diagnosis not present

## 2022-12-05 DIAGNOSIS — E119 Type 2 diabetes mellitus without complications: Secondary | ICD-10-CM | POA: Diagnosis not present

## 2022-12-09 DIAGNOSIS — Z794 Long term (current) use of insulin: Secondary | ICD-10-CM | POA: Diagnosis not present

## 2022-12-09 DIAGNOSIS — M069 Rheumatoid arthritis, unspecified: Secondary | ICD-10-CM | POA: Diagnosis not present

## 2022-12-09 DIAGNOSIS — Z7985 Long-term (current) use of injectable non-insulin antidiabetic drugs: Secondary | ICD-10-CM | POA: Diagnosis not present

## 2022-12-09 DIAGNOSIS — E7800 Pure hypercholesterolemia, unspecified: Secondary | ICD-10-CM | POA: Diagnosis not present

## 2022-12-09 DIAGNOSIS — G8191 Hemiplegia, unspecified affecting right dominant side: Secondary | ICD-10-CM | POA: Diagnosis not present

## 2022-12-09 DIAGNOSIS — I5042 Chronic combined systolic (congestive) and diastolic (congestive) heart failure: Secondary | ICD-10-CM | POA: Diagnosis not present

## 2022-12-09 DIAGNOSIS — I428 Other cardiomyopathies: Secondary | ICD-10-CM | POA: Diagnosis not present

## 2022-12-09 DIAGNOSIS — Z95810 Presence of automatic (implantable) cardiac defibrillator: Secondary | ICD-10-CM | POA: Diagnosis not present

## 2022-12-09 DIAGNOSIS — E119 Type 2 diabetes mellitus without complications: Secondary | ICD-10-CM | POA: Diagnosis not present

## 2022-12-09 DIAGNOSIS — I110 Hypertensive heart disease with heart failure: Secondary | ICD-10-CM | POA: Diagnosis not present

## 2022-12-12 DIAGNOSIS — Z7985 Long-term (current) use of injectable non-insulin antidiabetic drugs: Secondary | ICD-10-CM | POA: Diagnosis not present

## 2022-12-12 DIAGNOSIS — M069 Rheumatoid arthritis, unspecified: Secondary | ICD-10-CM | POA: Diagnosis not present

## 2022-12-12 DIAGNOSIS — E7800 Pure hypercholesterolemia, unspecified: Secondary | ICD-10-CM | POA: Diagnosis not present

## 2022-12-12 DIAGNOSIS — Z794 Long term (current) use of insulin: Secondary | ICD-10-CM | POA: Diagnosis not present

## 2022-12-12 DIAGNOSIS — Z95810 Presence of automatic (implantable) cardiac defibrillator: Secondary | ICD-10-CM | POA: Diagnosis not present

## 2022-12-12 DIAGNOSIS — E119 Type 2 diabetes mellitus without complications: Secondary | ICD-10-CM | POA: Diagnosis not present

## 2022-12-12 DIAGNOSIS — I428 Other cardiomyopathies: Secondary | ICD-10-CM | POA: Diagnosis not present

## 2022-12-12 DIAGNOSIS — G8191 Hemiplegia, unspecified affecting right dominant side: Secondary | ICD-10-CM | POA: Diagnosis not present

## 2022-12-12 DIAGNOSIS — I110 Hypertensive heart disease with heart failure: Secondary | ICD-10-CM | POA: Diagnosis not present

## 2022-12-12 DIAGNOSIS — I5042 Chronic combined systolic (congestive) and diastolic (congestive) heart failure: Secondary | ICD-10-CM | POA: Diagnosis not present

## 2022-12-18 DIAGNOSIS — Z7985 Long-term (current) use of injectable non-insulin antidiabetic drugs: Secondary | ICD-10-CM | POA: Diagnosis not present

## 2022-12-18 DIAGNOSIS — M069 Rheumatoid arthritis, unspecified: Secondary | ICD-10-CM | POA: Diagnosis not present

## 2022-12-18 DIAGNOSIS — E7800 Pure hypercholesterolemia, unspecified: Secondary | ICD-10-CM | POA: Diagnosis not present

## 2022-12-18 DIAGNOSIS — I110 Hypertensive heart disease with heart failure: Secondary | ICD-10-CM | POA: Diagnosis not present

## 2022-12-18 DIAGNOSIS — Z794 Long term (current) use of insulin: Secondary | ICD-10-CM | POA: Diagnosis not present

## 2022-12-18 DIAGNOSIS — G8191 Hemiplegia, unspecified affecting right dominant side: Secondary | ICD-10-CM | POA: Diagnosis not present

## 2022-12-18 DIAGNOSIS — Z95810 Presence of automatic (implantable) cardiac defibrillator: Secondary | ICD-10-CM | POA: Diagnosis not present

## 2022-12-18 DIAGNOSIS — I5042 Chronic combined systolic (congestive) and diastolic (congestive) heart failure: Secondary | ICD-10-CM | POA: Diagnosis not present

## 2022-12-18 DIAGNOSIS — E119 Type 2 diabetes mellitus without complications: Secondary | ICD-10-CM | POA: Diagnosis not present

## 2022-12-18 DIAGNOSIS — I428 Other cardiomyopathies: Secondary | ICD-10-CM | POA: Diagnosis not present

## 2022-12-19 DIAGNOSIS — E559 Vitamin D deficiency, unspecified: Secondary | ICD-10-CM | POA: Diagnosis not present

## 2022-12-19 DIAGNOSIS — N1831 Chronic kidney disease, stage 3a: Secondary | ICD-10-CM | POA: Diagnosis not present

## 2022-12-19 DIAGNOSIS — E785 Hyperlipidemia, unspecified: Secondary | ICD-10-CM | POA: Diagnosis not present

## 2022-12-19 DIAGNOSIS — I10 Essential (primary) hypertension: Secondary | ICD-10-CM | POA: Diagnosis not present

## 2022-12-19 DIAGNOSIS — E1169 Type 2 diabetes mellitus with other specified complication: Secondary | ICD-10-CM | POA: Diagnosis not present

## 2022-12-19 DIAGNOSIS — E1165 Type 2 diabetes mellitus with hyperglycemia: Secondary | ICD-10-CM | POA: Diagnosis not present

## 2022-12-20 IMAGING — DX DG CHEST 1V PORT
1 series · 1 of 1 positions shown · non-contrast
Comparison: Chest radiograph 09/23/2019

CLINICAL DATA: Cough

EXAM:
PORTABLE CHEST 1 VIEW

[chest ap]
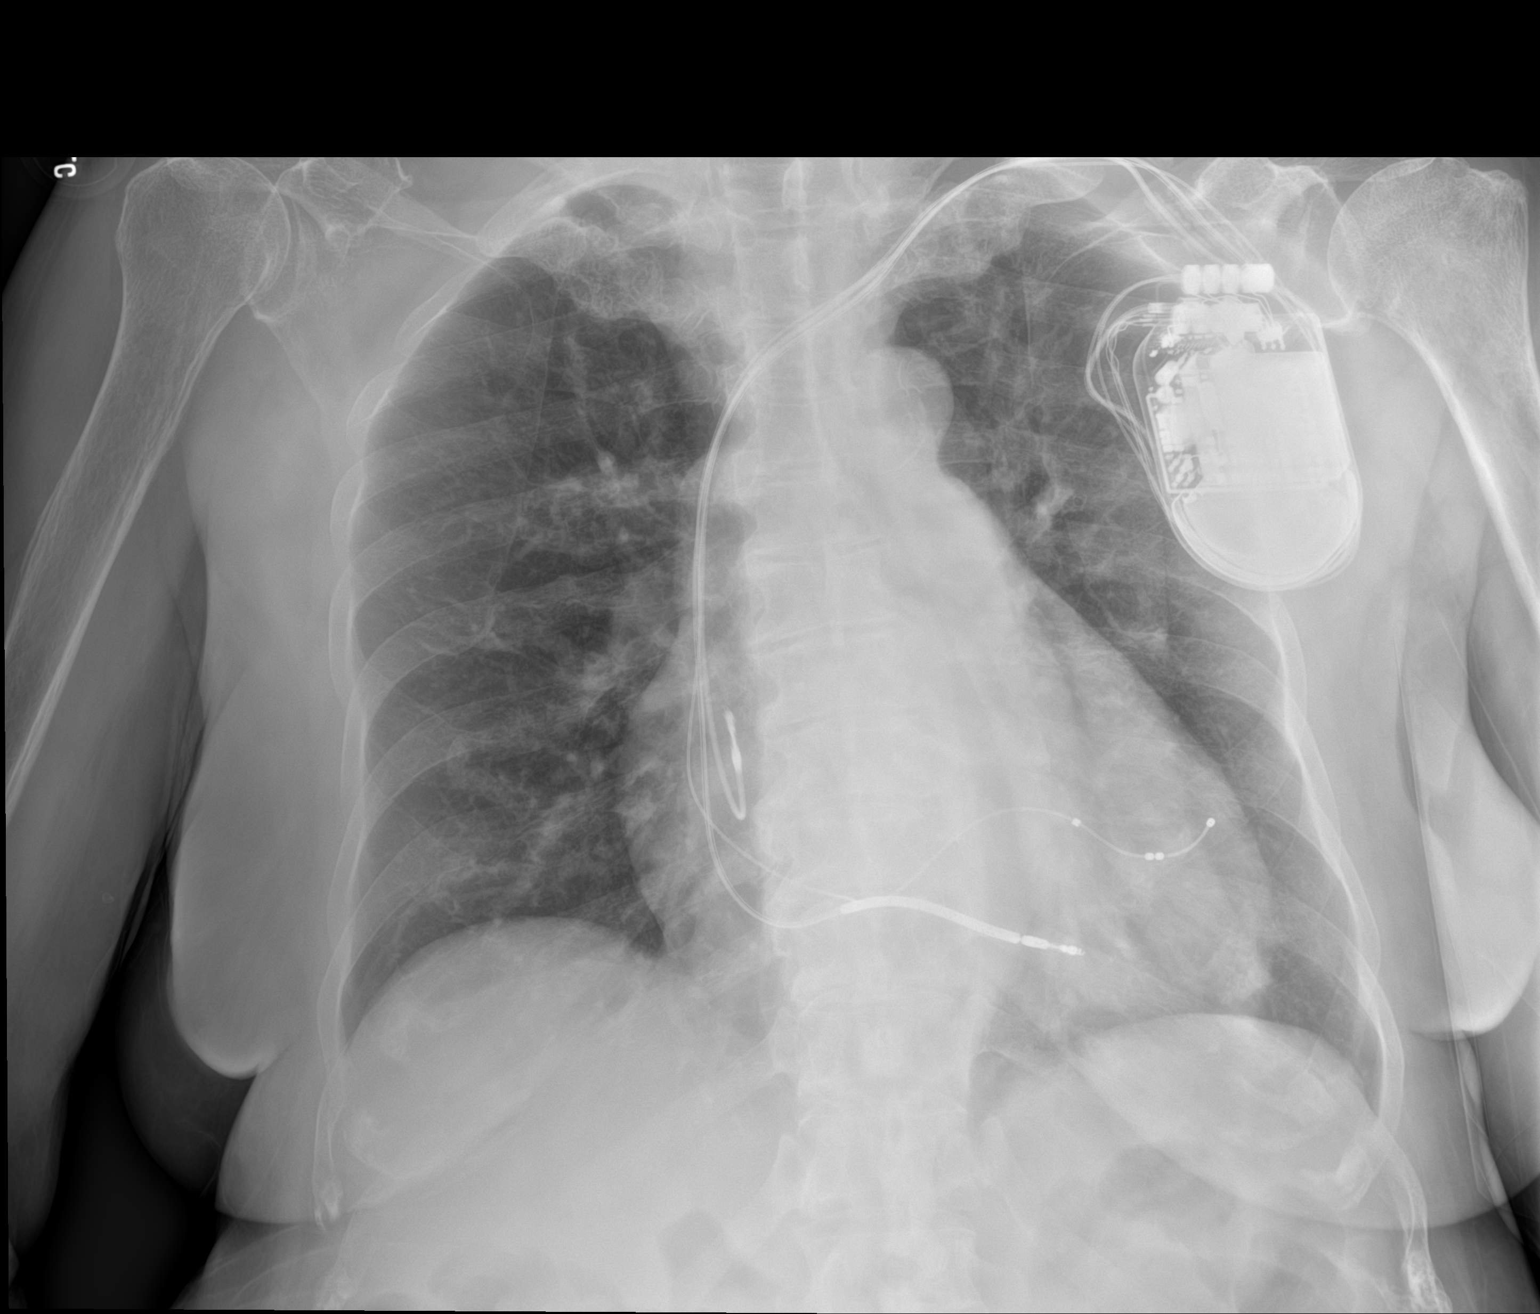

[1 of 1 positions shown; findings below may reference images not displayed]

FINDINGS: A left chest wall cardiac device and 3 associated leads are stable.

The heart is enlarged, unchanged. The mediastinal contours are
within normal limits.

There is patchy opacity in the left base not seen on the prior
study. There is no other focal consolidation. There is no pulmonary
edema. There is no pleural effusion or pneumothorax.

There is no acute osseous abnormality.
IMPRESSION: 1. Patchy opacity in the left base not seen on the prior study could
reflect pneumonia in the correct clinical setting. Recommend
follow-up PA and lateral radiographs in 6-8 weeks to assess for
resolution.
2. Cardiomegaly.

## 2022-12-26 DIAGNOSIS — Z95810 Presence of automatic (implantable) cardiac defibrillator: Secondary | ICD-10-CM | POA: Diagnosis not present

## 2022-12-26 DIAGNOSIS — E7800 Pure hypercholesterolemia, unspecified: Secondary | ICD-10-CM | POA: Diagnosis not present

## 2022-12-26 DIAGNOSIS — M069 Rheumatoid arthritis, unspecified: Secondary | ICD-10-CM | POA: Diagnosis not present

## 2022-12-26 DIAGNOSIS — I428 Other cardiomyopathies: Secondary | ICD-10-CM | POA: Diagnosis not present

## 2022-12-26 DIAGNOSIS — I5042 Chronic combined systolic (congestive) and diastolic (congestive) heart failure: Secondary | ICD-10-CM | POA: Diagnosis not present

## 2022-12-26 DIAGNOSIS — Z7985 Long-term (current) use of injectable non-insulin antidiabetic drugs: Secondary | ICD-10-CM | POA: Diagnosis not present

## 2022-12-26 DIAGNOSIS — G8191 Hemiplegia, unspecified affecting right dominant side: Secondary | ICD-10-CM | POA: Diagnosis not present

## 2022-12-26 DIAGNOSIS — I110 Hypertensive heart disease with heart failure: Secondary | ICD-10-CM | POA: Diagnosis not present

## 2022-12-26 DIAGNOSIS — Z794 Long term (current) use of insulin: Secondary | ICD-10-CM | POA: Diagnosis not present

## 2022-12-26 DIAGNOSIS — E119 Type 2 diabetes mellitus without complications: Secondary | ICD-10-CM | POA: Diagnosis not present

## 2023-01-16 DIAGNOSIS — M79671 Pain in right foot: Secondary | ICD-10-CM | POA: Diagnosis not present

## 2023-01-16 DIAGNOSIS — N3281 Overactive bladder: Secondary | ICD-10-CM | POA: Diagnosis not present

## 2023-02-05 ENCOUNTER — Ambulatory Visit (INDEPENDENT_AMBULATORY_CARE_PROVIDER_SITE_OTHER): Payer: Medicare Other | Admitting: Podiatry

## 2023-02-05 DIAGNOSIS — Z794 Long term (current) use of insulin: Secondary | ICD-10-CM | POA: Diagnosis not present

## 2023-02-05 DIAGNOSIS — R600 Localized edema: Secondary | ICD-10-CM | POA: Diagnosis not present

## 2023-02-05 DIAGNOSIS — M21371 Foot drop, right foot: Secondary | ICD-10-CM | POA: Diagnosis not present

## 2023-02-05 DIAGNOSIS — E1165 Type 2 diabetes mellitus with hyperglycemia: Secondary | ICD-10-CM | POA: Diagnosis not present

## 2023-02-05 NOTE — Progress Notes (Signed)
  Subjective:  Patient ID: Linda Lowe, female    DOB: 06-12-1951,   MRN: [id number]  Chief Complaint  Patient presents with   Foot Swelling    RM 23 Bilateral edema right over left x +4 months. Pt also complains of numbness and tingling. Pt is diabetic and take BP medications daily.     72 y.o. female presents for concern of bilateral edema and pain and weakness in right foot. Here for diabetic foot check Relates burning and tingling in their feet. Patient is diabetic and last A1c was  Lab Results  Component Value Date   HGBA1C 6.3 (H) 05/18/2021   .   PCP:  Renaye Rakers, MD     . Denies any other pedal complaints. Denies n/v/f/c.   Past Medical History:  Diagnosis Date   Arthritis    Cardiomyopathy (HCC)    a. 05/2012 Echo: EF 30-35%, Gr 1 DD, mild LVH, mild MR, pericardial effusion   Chronic systolic heart failure (HCC) 08/26/2013   Diabetes mellitus    a. 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 [phone] Quadra Assura 11/26/2013    St Jude    Pericardial effusion    a. 05/2012 Echo: circumferential pericardial effusion, mostly 10mm, largest pocket posterior - 23mm, mild RA indentation, no RV collapse, no tamponade.   Right patella fracture 07/08/2017   Stroke (HCC) 04/29/2018   Right hemiparesis in [place]    Objective:  Physical Exam: Vascular: DP/PT pulses 2/4 bilateral. CFT <3 seconds. Absent hair growth on digits. Edema noted to bilateral lower extremities. Xerosis noted bilaterally.  Skin. No lacerations or abrasions bilateral feet. Nails 1-5 bilateral  are thickened discolored  with subungual debris.  Musculoskeletal: MMT 5/5 leftl lower extremities in DF, PF, Inversion and Eversion. About 4/5 with DF on the right lower extremity.  Deceased ROM in DF of ankle joint.  Neurological: Sensation intact to light  touch. Protective sensation diminished bilateral.    Assessment:   1. Uncontrolled type 2 diabetes mellitus with hyperglycemia, with long-term current use of insulin (HCC)   2. Localized edema   3. Foot drop, right      Plan:  Patient was evaluated and treated and all questions answered. -Discussed and educated patient on diabetic foot care, especially with  regards to the vascular, neurological and musculoskeletal systems.  -Stressed the importance of good glycemic control and the detriment of not  controlling glucose levels in relation to the foot. -Discussed supportive shoes at all times and checking feet regularly.  -Discussed edema may be contributing to foot stiffness. Recommend elevation and compression stockings and taking BP medications regularly.  -Answered all patient questions -Patient to return  in  1 year for DM foot check.  -Patient advised to call the office if any problems or questions arise in the meantime.   Louann Sjogren, DPM

## 2023-02-07 DIAGNOSIS — M25511 Pain in right shoulder: Secondary | ICD-10-CM | POA: Diagnosis not present

## 2023-02-17 DIAGNOSIS — Z4502 Encounter for adjustment and management of automatic implantable cardiac defibrillator: Secondary | ICD-10-CM | POA: Diagnosis not present

## 2023-02-17 DIAGNOSIS — Z95810 Presence of automatic (implantable) cardiac defibrillator: Secondary | ICD-10-CM | POA: Diagnosis not present

## 2023-02-17 DIAGNOSIS — I5022 Chronic systolic (congestive) heart failure: Secondary | ICD-10-CM | POA: Diagnosis not present

## 2023-02-27 ENCOUNTER — Ambulatory Visit: Payer: TRICARE For Life (TFL) | Admitting: Cardiology

## 2023-03-03 ENCOUNTER — Encounter: Payer: Self-pay | Admitting: Cardiology

## 2023-03-03 ENCOUNTER — Ambulatory Visit: Payer: TRICARE For Life (TFL) | Admitting: Cardiology

## 2023-03-03 VITALS — BP 112/59 | HR 64 | Ht 64.5 in | Wt 172.0 lb

## 2023-03-03 DIAGNOSIS — Z95810 Presence of automatic (implantable) cardiac defibrillator: Secondary | ICD-10-CM | POA: Diagnosis not present

## 2023-03-03 DIAGNOSIS — I428 Other cardiomyopathies: Secondary | ICD-10-CM | POA: Diagnosis not present

## 2023-03-03 DIAGNOSIS — E119 Type 2 diabetes mellitus without complications: Secondary | ICD-10-CM | POA: Diagnosis not present

## 2023-03-03 DIAGNOSIS — G8191 Hemiplegia, unspecified affecting right dominant side: Secondary | ICD-10-CM

## 2023-03-03 DIAGNOSIS — Z794 Long term (current) use of insulin: Secondary | ICD-10-CM | POA: Diagnosis not present

## 2023-03-03 MED ORDER — SPIRONOLACTONE 25 MG PO TABS: 25.0000 mg | ORAL_TABLET | Freq: Every day | ORAL | 3 refills | Status: DC

## 2023-03-03 NOTE — Progress Notes (Signed)
Primary Physician/Referring:  Renaye Rakers, MD  Patient ID: Linda Lowe, female    DOB: 04/14/1951, 72 y.o.   MRN: [id number]  Chief Complaint  Patient presents with   Nonischemic cardiomyopathy [facility])   Follow-up   HPI:    Linda Lowe  is a 72 y.o. 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  Had BiV ICD generator change on 07/15/2022.  Presently asymptomatic except for occasional episodes of shortness of breath and orthopnea.  Continues to have difficulty with any kind of physical activity due to right hemiplegia.  Accompanied by her husband.  Past Medical History:  Diagnosis Date   Arthritis    Cardiomyopathy (HCC)    a. 05/2012 Echo: EF 30-35%, Gr 1 DD, mild LVH, mild MR, pericardial effusion   Chronic systolic heart failure (HCC) 08/26/2013   Diabetes mellitus    a. 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 [phone] Quadra Assura 11/26/2013    St Jude    Pericardial effusion    a. 05/2012 Echo: circumferential pericardial effusion, mostly 10mm, largest pocket posterior - 23mm, mild RA indentation, no RV collapse, no tamponade.   Right patella fracture 07/08/2017   Stroke (HCC) 04/29/2018   Right hemiparesis in [place]   Past Surgical History:  Procedure Laterality Date   ABDOMINAL HYSTERECTOMY     BI-VENTRICULAR IMPLANTABLE CARDIOVERTER DEFIBRILLATOR N/A 12/01/2013   St. Jude BI-VENTRICULAR IMPLANTABLE CARDIOVERTER DEFIBRILLATOR  (CRT-D);  Surgeon: Duke Salvia, MD;    BIOPSY  05/06/2021    Procedure: BIOPSY;  Surgeon: Jenel Lucks, MD;  Location: [facility] ENDOSCOPY;  Service: Gastroenterology;;   BIV ICD [place] N/A 07/15/2022   Procedure: BIV ICD GENERATOR CHANGEOUT;  Surgeon: Duke Salvia, MD;  Location: [facility] INVASIVE CV LAB;  Service: Cardiovascular;  Laterality: N/A;   COLONOSCOPY WITH PROPOFOL Left 05/06/2021   Procedure: COLONOSCOPY WITH PROPOFOL;  Surgeon: Jenel Lucks, MD;  Location: [facility] ENDOSCOPY;  Service: Gastroenterology;  Laterality: Left;   ESOPHAGOGASTRODUODENOSCOPY (EGD) WITH PROPOFOL Left 05/06/2021   Procedure: ESOPHAGOGASTRODUODENOSCOPY (EGD) WITH PROPOFOL;  Surgeon: Jenel Lucks, MD;  Location: [facility] ENDOSCOPY;  Service: Gastroenterology;  Laterality: Left;   FOOT SURGERY     LEFT AND RIGHT HEART CATHETERIZATION WITH CORONARY ANGIOGRAM  05/26/2012   Procedure: LEFT AND RIGHT HEART CATHETERIZATION WITH CORONARY ANGIOGRAM;  Surgeon: Tonny Bollman, MD;  Location: [facility] CATH LAB;  Service: Cardiovascular;;   ORIF PATELLA Right 07/08/2017   Procedure: OPEN REDUCTION INTERNAL (ORIF) FIXATION PATELLA;  Surgeon: Sheral Apley, MD;  Location: [facility] OR;  Service: Orthopedics;  Laterality: Right;   Social History   Tobacco Use   Smoking status: Former    Packs/day: 2.00    Years: 3.00    Additional pack years: 0.00    Total pack years: 6.00    Types: Cigarettes    Quit date: 1986    Years since quitting: 38.4   Smokeless tobacco: Never   Tobacco comments:    Quit "long time ago."  Substance Use Topics   Alcohol use: No  Marital status: Married   ROS  Review of Systems  Cardiovascular:  Positive for dyspnea on exertion and orthopnea. Negative for chest pain and leg swelling.  Neurological:  Positive for focal weakness (right hemiplegia).   Objective  Blood pressure (!) 112/59, pulse 64, height 5' 4.5" (1.638 m), weight 172 lb (78 kg), SpO2 96 %.     03/03/2023    1:13 PM 10/22/2022    3:45 PM 08/30/2022   10:45 AM  Vitals with BMI   Height 5' 4.5" 5' 4.5"   Weight 172 lbs 165 lbs   BMI 29.08 27.9   Systolic 112 100 130  Diastolic 59 56 68  Pulse 64 65 83     Physical Exam Neck:     Vascular: No carotid bruit or JVD.  Cardiovascular:     Rate and Rhythm: Normal rate and regular rhythm.     Pulses: Intact distal pulses.     Heart sounds: Normal heart sounds. No murmur heard.    No gallop.  Pulmonary:     Effort: Pulmonary effort is normal.     Breath sounds: Normal breath sounds.  Abdominal:     General: Bowel sounds are normal.     Palpations: Abdomen is soft.  Musculoskeletal:     Right lower leg: No edema.     Left lower leg: No edema.    Laboratory examination:      Latest Ref Rng & Units 08/29/2022   11:43 PM 07/09/2022    3:05 PM 03/18/2022   12:29 PM  CMP  Glucose 70 - 99 mg/dL 865  784  696   BUN 8 - 23 mg/dL 23  55  29   Creatinine 0.44 - 1.00 mg/dL 2.95  2.84  1.32   Sodium 135 - 145 mmol/L 131  138  139   Potassium 3.5 - 5.1 mmol/L 4.1  5.0  4.6   Chloride 98 - 111 mmol/L 99  108  104   CO2 22 - 32 mmol/L 21  22  22    Calcium 8.9 - 10.3 mg/dL 8.6  8.9  9.0       Latest Ref Rng & Units 08/29/2022   11:43 PM 07/09/2022    3:05 PM 05/18/2021   10:39 AM  CBC  WBC 4.0 - 10.5 K/uL 9.4  9.5  8.3   Hemoglobin 12.0 - 15.0 g/dL 44.0  10.2  72.5   Hematocrit 36.0 - 46.0 % 36.9  38.2  38.9   Platelets 150 - 400 K/uL 167  194  464    Lab Results  Component Value Date   CHOL 150 05/18/2021   HDL 41 05/18/2021   LDLCALC 91 05/18/2021   LDLDIRECT 71 03/15/2020   TRIG 95 05/18/2021   CHOLHDL 3 04/10/2017     HEMOGLOBIN A1C Lab Results  Component Value Date   HGBA1C 6.3 (H) 05/18/2021   MPG 182.9 05/03/2021   Lab Results  Component Value Date   TSH 1.930 05/18/2021    External labs:  Cholesterol, total 169.000 m 08/06/2022 HDL 64.000 mg 08/06/2022 LDL 83.000 mg 08/06/2022 Triglycerides 119.000 m 08/06/2022  A1C 6.700 % of 08/06/2022 TSH 3.650 08/06/2022  Hemoglobin 13.100  g/d 08/06/2022 Platelets 208.000 Th 08/06/2022  Creatinine, Serum 1.050 mg/ 08/06/2022 Potassium 5.000 mm 08/06/2022 ALT (SGPT) 35.000 U/L 08/06/2022  BNP 107.300 p 03/18/2022 no change from 03/18/2022  Allergies  No Known Allergies   Medications    Current Outpatient Medications:    acetaminophen (TYLENOL) 500  MG tablet, Take 1,000 mg by mouth every 6 (six) hours as needed for moderate pain., Disp: , Rfl:    amLODipine (NORVASC) 5 MG tablet, Take 5 mg by mouth daily., Disp: , Rfl:    atorvastatin (LIPITOR) 80 MG tablet, TAKE 1 TABLET(80 MG) BY MOUTH DAILY, Disp: 90 tablet, Rfl: 2   B-D UF III MINI PEN NEEDLES 31G X 5 MM MISC, USE 5 NEEDLES EVERY DAY AS DIRECTED, Disp: 200 each, Rfl: 0   carvedilol (COREG) 25 MG tablet, Take 25 mg by mouth 2 (two) times daily., Disp: , Rfl: 4   citalopram (CELEXA) 10 MG tablet, Take 20 mg by mouth daily., Disp: , Rfl:    Continuous Blood Gluc Receiver (FREESTYLE LIBRE 2 READER) DEVI, , Disp: , Rfl:    Continuous Blood Gluc Sensor (FREESTYLE LIBRE 2 SENSOR) MISC, 4 (four) times daily., Disp: , Rfl:    diclofenac Sodium (VOLTAREN) 1 % GEL, Apply 1 Application topically 2 (two) times daily., Disp: , Rfl:    diphenhydrAMINE (BENADRYL) 25 MG tablet, Take 25 mg by mouth daily as needed for allergies., Disp: , Rfl:    ferrous sulfate 325 (65 FE) MG tablet, Take 1 tablet (325 mg total) by mouth 2 (two) times daily with a meal., Disp: 60 tablet, Rfl: 1   furosemide (LASIX) 40 MG tablet, Take 1 tablet (40 mg total) by mouth daily as needed for fluid., Disp: 30 tablet, Rfl: 3   hydrALAZINE (APRESOLINE) 25 MG tablet, Take 25 mg by mouth 3 (three) times daily., Disp: , Rfl:    insulin lispro (HUMALOG KWIKPEN) 100 UNIT/ML KwikPen, Inject 12-15 Units into the skin 2 (two) times daily as needed (high blood sugar)., Disp: , Rfl:    Insulin Pen Needle (B-D UF III MINI PEN NEEDLES) 31G X 5 MM MISC, USE 5 PEN NEEDLES PER DAY AS INSTRUCTED, Disp: 150 each, Rfl: 10   Insulin  Pen Needle (B-D UF III MINI PEN NEEDLES) 31G X 5 MM MISC, USE 5 NEEDLES EVERY DAY AS DIRECTED, Disp: 200 each, Rfl: 0   irbesartan (AVAPRO) 300 MG tablet, TAKE 1 TABLET(300 MG) BY MOUTH DAILY, Disp: 90 tablet, Rfl: 0   isosorbide dinitrate (ISORDIL) 30 MG tablet, Take 30 mg by mouth 3 (three) times daily., Disp: , Rfl:    lidocaine (LIDODERM) 5 %, Place 1 patch onto the skin 2 (two) times daily., Disp: , Rfl:    NOVOLOG FLEXPEN 100 UNIT/ML FlexPen, ADMINISTER 16 UNITS UNDER THE SKIN TWICE DAILY BEFORE LUNCH AND SUPPER, Disp: 15 mL, Rfl: 0   OZEMPIC, 0.25 OR 0.5 MG/DOSE, 2 MG/3ML SOPN, Inject 0.5 mg into the skin every Monday., Disp: , Rfl:    spironolactone (ALDACTONE) 25 MG tablet, Take 1 tablet (25 mg total) by mouth daily., Disp: 90 tablet, Rfl: 3   tizanidine (ZANAFLEX) 2 MG capsule, Take 1 capsule (2 mg total) by mouth 3 (three) times daily as needed for muscle spasms., Disp: 15 capsule, Rfl: 0   TRESIBA FLEXTOUCH 200 UNIT/ML FlexTouch Pen, Inject 44 Units into the skin daily., Disp: , Rfl:    Radiology:   CT Head 09/16/2019: No evidence of acute intracranial injury. 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  Cardiac Studies:   Coronary Angiogram   [2012/06/24]: No significant coronary artery disease. Ejection fraction 35%. Mild pulmonary hypertension.  Echocardiogram 07/20/2021:   1. Left ventricular ejection fraction, by estimation, is 60 to 65%. The left ventricle has normal function. The left ventricle has no regional wall  motion abnormalities. Left ventricular diastolic parameters were normal.  2. Right ventricular systolic function is normal. The right ventricular size is normal. Right ventricular wall thickness is increased.  3. The mitral valve is grossly normal. Trivial mitral valve regurgitation. No evidence of mitral stenosis.  4. The aortic valve is tricuspid. Aortic valve regurgitation is not visualized. Aortic valve sclerosis is present, with no evidence of aortic  valve stenosis.  5. The inferior vena cava is normal in size with greater than 50% respiratory variability, suggesting right atrial pressure of 3 mmHg.   ICD Biventricular (ICD) in situ [employer] 07/15/2022 (Gen Change)    Remote ICD check 02/16/2023: Longevity 5 years and 11 months.  AP 64 %, LV 99%. There were brief reverse mode switches, no high ventricular rate episodes.  Lead impedance and thresholds are normal.  Thoracic impedance is back to baseline after transient fluid overload state in the middle of May 2024.  Normal ICD function.    EKG   EKG 03/03/2023: Atrially paced and biventricular paced rhythm.  No further analysis.  Assessment     ICD-10-CM   1. Nonischemic cardiomyopathy (HCC)  I42.8 EKG 12-Lead    spironolactone (ALDACTONE) 25 MG tablet    Pro b natriuretic peptide (BNP)    Basic metabolic panel    2. ICD Biventricular implantable cardioverter-defibrillator (ICD) in situ [employer] 07/15/2022 (Gen Change)  Z95.810     3. Type 2 diabetes mellitus without complication, with long-term current use of insulin (HCC)  E11.9    Z79.4     4. Right hemiplegia (HCC)  G81.91       Meds ordered this encounter  Medications   spironolactone (ALDACTONE) 25 MG tablet    Sig: Take 1 tablet (25 mg total) by mouth daily.    Dispense:  90 tablet    Refill:  3   Medications Discontinued During This Encounter  Medication Reason   potassium chloride SA (KLOR-CON M) 20 MEQ tablet Discontinued by provider    Recommendations:   Meds ordered this encounter  Medications   spironolactone (ALDACTONE) 25 MG tablet    Sig: Take 1 tablet (25 mg total) by mouth daily.    Dispense:  90 tablet    Refill:  3    Linda Lowe  is a 72 y.o. 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  Had BiV ICD generator change on 07/15/2022.  Presently asymptomatic except for occasional episodes of shortness of breath and orthopnea.  1. Nonischemic cardiomyopathy (HCC) Patient is well compensated, both by sitting and by physical examination.  Faint bibasilar crackles mostly on the right is chronic and suspect atelectasis.  No clinical evidence of heart failure.  However I would like to start her on spironolactone 25 mg daily both for cardiomyopathy and chronic systolic heart failure.  Fortunately since BiV ICD placement, LVEF is improved and normalized.  In view of occasional episodes of shortness of breath and also orthopnea, hopefully spironolactone will keep her symptoms stable.  Will check BMP and NT proBNP in 2 weeks.  - EKG 12-Lead - spironolactone (ALDACTONE) 25 MG tablet; Take 1 tablet (25 mg total) by mouth daily.  Dispense: 90 tablet; Refill: 3 -  Pro b natriuretic peptide (BNP) - Basic metabolic panel  2. ICD Biventricular implantable cardioverter-defibrillator (ICD) in situ [employer] 07/15/2022 (Gen Change) ICD is functioning normally.  No suggestion of fluid overload state.  3. Type 2 diabetes mellitus without complication, with long-term current use of insulin (HCC) Patient has been gaining weight, has gained about 30 pounds in weight over the past 6 months.  This is since she returned from [place].  Discussed with the patient and her husband that they eat out pretty much frequently, to decrease food intake, importance of diabetes control.  She is presently on Ozempic, 0.2 mg every week, could consider increasing this and titrating this up all the way to 2 mg every week.  4. Right hemiplegia (HCC) Old stroke with no recurrence, fortunately vascular examination is normal.  I will see her back in 6 months for  follow-up.     Linda Decamp, MD, [facility] 03/03/2023, 2:00 PM Office: [phone] Fax: [phone] Pager: [phone]

## 2023-03-09 ENCOUNTER — Other Ambulatory Visit: Payer: Self-pay | Admitting: Cardiology

## 2023-03-17 DIAGNOSIS — I428 Other cardiomyopathies: Secondary | ICD-10-CM | POA: Diagnosis not present

## 2023-03-18 LAB — BASIC METABOLIC PANEL
BUN/Creatinine Ratio: 36 — ABNORMAL HIGH (ref 12–28)
BUN: 27 mg/dL (ref 8–27)
CO2: 21 mmol/L (ref 20–29)
Calcium: 8.6 mg/dL — ABNORMAL LOW (ref 8.7–10.3)
Chloride: 110 mmol/L — ABNORMAL HIGH (ref 96–106)
Creatinine, Ser: 0.76 mg/dL (ref 0.57–1.00)
Glucose: 187 mg/dL — ABNORMAL HIGH (ref 70–99)
Potassium: 4.3 mmol/L (ref 3.5–5.2)
Sodium: 143 mmol/L (ref 134–144)
eGFR: 84 mL/min/{1.73_m2} (ref >59)

## 2023-03-18 LAB — PRO B NATRIURETIC PEPTIDE: NT-Pro BNP: 330 pg/mL — ABNORMAL HIGH (ref 0–301)

## 2023-04-16 DIAGNOSIS — I10 Essential (primary) hypertension: Secondary | ICD-10-CM | POA: Diagnosis not present

## 2023-04-16 DIAGNOSIS — E1142 Type 2 diabetes mellitus with diabetic polyneuropathy: Secondary | ICD-10-CM | POA: Diagnosis not present

## 2023-04-16 DIAGNOSIS — E785 Hyperlipidemia, unspecified: Secondary | ICD-10-CM | POA: Diagnosis not present

## 2023-04-18 DIAGNOSIS — E559 Vitamin D deficiency, unspecified: Secondary | ICD-10-CM | POA: Diagnosis not present

## 2023-04-18 DIAGNOSIS — E782 Mixed hyperlipidemia: Secondary | ICD-10-CM | POA: Diagnosis not present

## 2023-04-18 DIAGNOSIS — I119 Hypertensive heart disease without heart failure: Secondary | ICD-10-CM | POA: Diagnosis not present

## 2023-04-18 DIAGNOSIS — I959 Hypotension, unspecified: Secondary | ICD-10-CM | POA: Diagnosis not present

## 2023-04-18 DIAGNOSIS — E1165 Type 2 diabetes mellitus with hyperglycemia: Secondary | ICD-10-CM | POA: Diagnosis not present

## 2023-04-18 DIAGNOSIS — I5022 Chronic systolic (congestive) heart failure: Secondary | ICD-10-CM | POA: Diagnosis not present

## 2023-04-18 DIAGNOSIS — I10 Essential (primary) hypertension: Secondary | ICD-10-CM | POA: Diagnosis not present

## 2023-04-23 ENCOUNTER — Other Ambulatory Visit: Payer: Self-pay | Admitting: Cardiology

## 2023-04-23 DIAGNOSIS — E7800 Pure hypercholesterolemia, unspecified: Secondary | ICD-10-CM

## 2023-05-02 DIAGNOSIS — Z0001 Encounter for general adult medical examination with abnormal findings: Secondary | ICD-10-CM | POA: Diagnosis not present

## 2023-05-02 DIAGNOSIS — E1165 Type 2 diabetes mellitus with hyperglycemia: Secondary | ICD-10-CM | POA: Diagnosis not present

## 2023-05-02 DIAGNOSIS — E782 Mixed hyperlipidemia: Secondary | ICD-10-CM | POA: Diagnosis not present

## 2023-05-02 DIAGNOSIS — N1831 Chronic kidney disease, stage 3a: Secondary | ICD-10-CM | POA: Diagnosis not present

## 2023-05-02 DIAGNOSIS — I10 Essential (primary) hypertension: Secondary | ICD-10-CM | POA: Diagnosis not present

## 2023-05-05 ENCOUNTER — Other Ambulatory Visit: Payer: Self-pay | Admitting: Cardiology

## 2023-05-05 DIAGNOSIS — I428 Other cardiomyopathies: Secondary | ICD-10-CM

## 2023-05-05 DIAGNOSIS — G8191 Hemiplegia, unspecified affecting right dominant side: Secondary | ICD-10-CM

## 2023-05-05 DIAGNOSIS — I5042 Chronic combined systolic (congestive) and diastolic (congestive) heart failure: Secondary | ICD-10-CM

## 2023-05-05 NOTE — Progress Notes (Signed)
ICD-10-CM   1. Nonischemic cardiomyopathy (HCC)  I42.8 Ambulatory referral to Physical Therapy    2. Chronic combined systolic and diastolic heart failure (HCC)  W09.81 Ambulatory referral to Physical Therapy    3. Right hemiplegia (HCC)  G81.91 Ambulatory referral to Physical Therapy     Orders Placed This Encounter  Procedures   Ambulatory referral to Physical Therapy    Referral Priority:   Routine    Referral Type:   Physical Medicine    Referral Reason:   Specialty Services Required    Requested Specialty:   Physical Therapy    Number of Visits Requested:   1

## 2023-05-22 DIAGNOSIS — N1831 Chronic kidney disease, stage 3a: Secondary | ICD-10-CM | POA: Diagnosis not present

## 2023-05-22 DIAGNOSIS — E1165 Type 2 diabetes mellitus with hyperglycemia: Secondary | ICD-10-CM | POA: Diagnosis not present

## 2023-05-22 DIAGNOSIS — M25511 Pain in right shoulder: Secondary | ICD-10-CM | POA: Diagnosis not present

## 2023-05-26 ENCOUNTER — Ambulatory Visit: Payer: Medicare Other | Attending: Cardiology | Admitting: Physical Therapy

## 2023-05-26 ENCOUNTER — Encounter: Payer: Self-pay | Admitting: Physical Therapy

## 2023-05-26 VITALS — BP 127/74

## 2023-05-26 DIAGNOSIS — I428 Other cardiomyopathies: Secondary | ICD-10-CM | POA: Diagnosis not present

## 2023-05-26 DIAGNOSIS — R29898 Other symptoms and signs involving the musculoskeletal system: Secondary | ICD-10-CM | POA: Insufficient documentation

## 2023-05-26 DIAGNOSIS — M6281 Muscle weakness (generalized): Secondary | ICD-10-CM | POA: Insufficient documentation

## 2023-05-26 DIAGNOSIS — R269 Unspecified abnormalities of gait and mobility: Secondary | ICD-10-CM | POA: Diagnosis not present

## 2023-05-26 DIAGNOSIS — G8191 Hemiplegia, unspecified affecting right dominant side: Secondary | ICD-10-CM | POA: Insufficient documentation

## 2023-05-26 DIAGNOSIS — I5042 Chronic combined systolic (congestive) and diastolic (congestive) heart failure: Secondary | ICD-10-CM | POA: Insufficient documentation

## 2023-05-26 NOTE — Therapy (Unsigned)
OUTPATIENT PHYSICAL THERAPY NEURO EVALUATION   Patient Name: Linda Lowe MRN: [id number] DOB:[birth year], 72 y.o., female Today's Date: 05/26/2023   PCP: Renaye Rakers, MD REFERRING PROVIDER: [name], MD  END OF SESSION:  PT End of Session - 05/26/23 1239     Visit Number 1    Authorization Type [employer]    PT Start Time 1238   patient had to use bathroom prior to start of eval   Equipment Utilized During Treatment Gait belt    Activity Tolerance Patient tolerated treatment well    Behavior During Therapy [facility] for tasks assessed/performed             Past Medical History:  Diagnosis Date   Arthritis    Cardiomyopathy (HCC)    a. 05/2012 Echo: EF 30-35%, Gr 1 DD, mild LVH, mild MR, pericardial effusion   Chronic systolic heart failure (HCC) 08/26/2013   Diabetes mellitus    a. 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 [phone] [employer] 11/26/2013    St Jude    Pericardial effusion    a. 05/2012 Echo: circumferential pericardial effusion, mostly 10mm, largest pocket posterior - 23mm, mild RA indentation, no RV collapse, no tamponade.   Right patella fracture 07/08/2017   Stroke (HCC) 04/29/2018   Right hemiparesis in [place]   Past Surgical History:  Procedure Laterality Date   ABDOMINAL HYSTERECTOMY     BI-VENTRICULAR IMPLANTABLE CARDIOVERTER DEFIBRILLATOR N/A 12/01/2013   St. Jude BI-VENTRICULAR IMPLANTABLE CARDIOVERTER DEFIBRILLATOR  (CRT-D);  Surgeon: Duke Salvia, MD;    BIOPSY  05/06/2021   Procedure: BIOPSY;  Surgeon: Jenel Lucks, MD;  Location: [facility] ENDOSCOPY;  Service: Gastroenterology;;   BIV ICD [place] N/A 07/15/2022   Procedure: BIV ICD GENERATOR CHANGEOUT;  Surgeon: Duke Salvia, MD;  Location: [facility] INVASIVE CV LAB;  Service: Cardiovascular;  Laterality:  N/A;   COLONOSCOPY WITH PROPOFOL Left 05/06/2021   Procedure: COLONOSCOPY WITH PROPOFOL;  Surgeon: Jenel Lucks, MD;  Location: [facility] ENDOSCOPY;  Service: Gastroenterology;  Laterality: Left;   ESOPHAGOGASTRODUODENOSCOPY (EGD) WITH PROPOFOL Left 05/06/2021   Procedure: ESOPHAGOGASTRODUODENOSCOPY (EGD) WITH PROPOFOL;  Surgeon: Jenel Lucks, MD;  Location: [facility] ENDOSCOPY;  Service: Gastroenterology;  Laterality: Left;   FOOT SURGERY     LEFT AND RIGHT HEART CATHETERIZATION WITH CORONARY ANGIOGRAM  05/26/2012   Procedure: LEFT AND RIGHT HEART CATHETERIZATION WITH CORONARY ANGIOGRAM;  Surgeon: Tonny Bollman, MD;  Location: [facility] CATH LAB;  Service: Cardiovascular;;   ORIF PATELLA Right 07/08/2017   Procedure: OPEN REDUCTION INTERNAL (ORIF) FIXATION PATELLA;  Surgeon: Sheral Apley, MD;  Location: [facility] OR;  Service: Orthopedics;  Laterality: Right;   Patient Active Problem List   Diagnosis Date Noted   Gastric ulcer with hemorrhage    Anemia 05/02/2021   Influenza A 05/02/2021   Encounter for management of biventricular implantable cardioverter-defibrillator (ICD) 10/13/2019   ICD Biventricular implantable cardioverter-defibrillator (ICD) in situ [employer] 07/15/2022 (Gen Change)    Uncontrolled type 2 diabetes mellitus with hyperglycemia, with long-term current use of insulin (HCC) 09/28/2016   Nonischemic cardiomyopathy (HCC) 08/26/2013   Chronic systolic heart failure (HCC) 08/26/2013   LBBB (left bundle branch block) 08/26/2013   Hyperlipidemia 06/23/2013   Essential hypertension 05/23/2012    ONSET DATE: 05/05/2023 (referral date)  REFERRING  DIAG: I42.8 (ICD-10-CM) - Nonischemic cardiomyopathy (HCC) I50.42 (ICD-10-CM) - Chronic combined systolic and diastolic heart failure (HCC) G81.91 (ICD-10-CM) - Right hemiplegia (HCC)  THERAPY DIAG:  No diagnosis found.  Rationale for Evaluation and Treatment: Rehabilitation  SUBJECTIVE:                                                                                                                                                                                              SUBJECTIVE STATEMENT: Patient reports that since her stroke she has been walking with 2WW short distance < 20 feet. Patient is primarily using basic manual wheelchair to get around. Patient reports having some ankle brace but she is not using it. Patient has a pacemaker in place. Patient has been using [employer] as primary mode of transportation and they are getting a new chair next month with leg rests. Patient has complex cardiac history and reports dyspnea with exertion. Patient reports last having home health physical therapy in December of last year.   Pt accompanied by: significant other - [place] husband  PERTINENT HISTORY: I42.8 (ICD-10-CM) - Nonischemic cardiomyopathy (HCC) I50.42 (ICD-10-CM) - Chronic combined systolic and diastolic heart failure (HCC) G81.91 (ICD-10-CM) - Right hemiplegia (HCC)  PAIN:  Are you having pain? Yes: NPRS scale: 9/10 Pain location: R shoulder pain (known history) Pain description: sharp Aggravating factors: tylenol Relieving factors: none  PRECAUTIONS: Fall and ICD/Pacemaker  RED FLAGS: None   WEIGHT BEARING RESTRICTIONS: No  FALLS: Has patient fallen in last 6 months? Yes. Number of falls 2 - lost balance in bathroom  LIVING ENVIRONMENT: Lives with: lives with their spouse Lives in: House/apartment Stairs: No Has following equipment at home: Dan Humphreys - 2 wheeled, Wheelchair (manual), [employer], and Grab bars  PLOF:  Since stroke in 2020 patient needs help with ADLs including dressing, making meals, driving but prior to stroke fully independent  PATIENT GOALS: "I would like to be walking more easily."   OBJECTIVE:   DIAGNOSTIC FINDINGS:  CT Head 05/16/2021 CT HEAD WO CONTRAST:  IMPRESSION: 1. No acute intracranial hemorrhage or calvarial fracture. 2. Unchanged remote infarcts in the right  occipital lobe and left pons.  COGNITION: Overall cognitive status: Impaired   SENSATION: Reports intermittent numbness in RLE  COORDINATION: WFL on LUE, reduced mobilty on RUE impacts accuracy of testing  EDEMA:  Mild RLE edema 1+ on pitting edema scale  POSTURE: rounded shoulders, forward head, and posterior pelvic tilt seated in manual wheelchair  LOWER EXTREMITY ROM:     Active  Right Eval Left Eval  Hip flexion    Hip extension  20 degree flexion contracture  Hip abduction    Hip adduction    Hip internal rotation    Hip external rotation    Knee flexion    Knee extension    Ankle dorsiflexion    Ankle plantarflexion    Ankle inversion    Ankle eversion     (Blank rows = not tested)  R hemiparetic side grossly reduced, various with tone  LOWER EXTREMITY MMT:    MMT Right Eval Left Eval  Hip flexion 2+/5 3/5  Hip extension    Hip abduction    Hip adduction    Hip internal rotation    Hip external rotation    Knee flexion 2+/5 3/5  Knee extension 2+/5 3-/5  Ankle dorsiflexion    Ankle plantarflexion    Ankle inversion    Ankle eversion    (Blank rows = not tested)  TRANSFERS: Assistive device utilized: {Assistive devices:23999}  Sit to stand: {Levels of assistance:24026} Stand to sit: {Levels of assistance:24026} Chair to chair: {Levels of assistance:24026} Floor: {Levels of assistance:24026}  RAMP:  Level of Assistance: {Levels of assistance:24026} Assistive device utilized: {Assistive devices:23999} Ramp Comments: ***  CURB:  Level of Assistance: {Levels of assistance:24026} Assistive device utilized: {Assistive devices:23999} Curb Comments: ***  STAIRS: Level of Assistance: {Levels of assistance:24026} Stair Negotiation Technique: {Stair Technique:27161} with {Rail Assistance:27162} Number of Stairs: ***  Height of Stairs: ***  Comments: ***  GAIT: Gait pattern: {gait characteristics:25376} Distance walked: *** Assistive device  utilized: {Assistive devices:23999} Level of assistance: {Levels of assistance:24026} Comments: ***  FUNCTIONAL TESTS:  {Functional tests:24029}  PATIENT SURVEYS:  {rehab surveys:24030}  TODAY'S TREATMENT:                                                                                                                              DATE: ***    PATIENT EDUCATION: Education details: *** Person educated: {Person educated:25204} Education method: {Education Method:25205} Education comprehension: {Education Comprehension:25206}  HOME EXERCISE PROGRAM: ***  GOALS: Goals reviewed with patient? {yes/no:20286}  SHORT TERM GOALS: Target date: ***  *** Baseline: Goal status: INITIAL  2.  *** Baseline:  Goal status: INITIAL  3.  *** Baseline:  Goal status: INITIAL  4.  *** Baseline:  Goal status: INITIAL  5.  *** Baseline:  Goal status: INITIAL  6.  *** Baseline:  Goal status: INITIAL  LONG TERM GOALS: Target date: ***  *** Baseline:  Goal status: INITIAL  2.  *** Baseline:  Goal status: INITIAL  3.  *** Baseline:  Goal status: INITIAL  4.  *** Baseline:  Goal status: INITIAL  5.  *** Baseline:  Goal status: INITIAL  6.  *** Baseline:  Goal status: INITIAL  ASSESSMENT:  CLINICAL IMPRESSION: Patient is a *** y.o. *** who was seen today for physical therapy evaluation and treatment for ***.   OBJECTIVE IMPAIRMENTS: {opptimpairments:25111}.   ACTIVITY LIMITATIONS: {activitylimitations:27494}  PARTICIPATION LIMITATIONS: {participationrestrictions:25113}  PERSONAL FACTORS: {Personal factors:25162} are also affecting patient's functional  outcome.   REHAB POTENTIAL: {rehabpotential:25112}  CLINICAL DECISION MAKING: {clinical decision making:25114}  EVALUATION COMPLEXITY: {Evaluation complexity:25115}  PLAN:  PT FREQUENCY: {rehab frequency:25116}  PT DURATION: {rehab duration:25117}  PLANNED INTERVENTIONS: {rehab planned  interventions:25118::"Therapeutic exercises","Therapeutic activity","Neuromuscular re-education","Balance training","Gait training","Patient/Family education","Self Care","Joint mobilization"}  PLAN FOR NEXT SESSION: ***   Carmelia Bake, PT 05/26/2023, 12:42 PM

## 2023-05-27 ENCOUNTER — Encounter: Payer: Self-pay | Admitting: Physical Therapy

## 2023-05-28 DIAGNOSIS — M25511 Pain in right shoulder: Secondary | ICD-10-CM | POA: Diagnosis not present

## 2023-05-30 DIAGNOSIS — Z95810 Presence of automatic (implantable) cardiac defibrillator: Secondary | ICD-10-CM | POA: Diagnosis not present

## 2023-05-30 DIAGNOSIS — I5022 Chronic systolic (congestive) heart failure: Secondary | ICD-10-CM | POA: Diagnosis not present

## 2023-05-30 DIAGNOSIS — Z4502 Encounter for adjustment and management of automatic implantable cardiac defibrillator: Secondary | ICD-10-CM | POA: Diagnosis not present

## 2023-06-05 ENCOUNTER — Other Ambulatory Visit: Payer: Self-pay | Admitting: Cardiology

## 2023-06-05 DIAGNOSIS — I428 Other cardiomyopathies: Secondary | ICD-10-CM

## 2023-06-05 DIAGNOSIS — I69359 Hemiplegia and hemiparesis following cerebral infarction affecting unspecified side: Secondary | ICD-10-CM

## 2023-06-05 DIAGNOSIS — I5022 Chronic systolic (congestive) heart failure: Secondary | ICD-10-CM

## 2023-06-05 DIAGNOSIS — E1165 Type 2 diabetes mellitus with hyperglycemia: Secondary | ICD-10-CM

## 2023-06-05 NOTE — Progress Notes (Signed)
ICD-10-CM   1. Nonischemic cardiomyopathy (HCC)  I42.8 Ambulatory referral to Home Health    2. Chronic systolic heart failure (HCC)  J47.82 Ambulatory referral to Home Health    3. Uncontrolled type 2 diabetes mellitus with hyperglycemia, with long-term current use of insulin (HCC)  E11.65 Ambulatory referral to Home Health   Z79.4     4. 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

## 2023-06-08 ENCOUNTER — Other Ambulatory Visit: Payer: Self-pay | Admitting: Cardiology

## 2023-06-10 DIAGNOSIS — K219 Gastro-esophageal reflux disease without esophagitis: Secondary | ICD-10-CM | POA: Diagnosis not present

## 2023-06-10 DIAGNOSIS — K5730 Diverticulosis of large intestine without perforation or abscess without bleeding: Secondary | ICD-10-CM | POA: Diagnosis not present

## 2023-06-10 DIAGNOSIS — K449 Diaphragmatic hernia without obstruction or gangrene: Secondary | ICD-10-CM | POA: Diagnosis not present

## 2023-06-16 ENCOUNTER — Telehealth: Payer: Self-pay | Admitting: *Deleted

## 2023-06-16 NOTE — Telephone Encounter (Signed)
I s/w the pt and her husband, we will need a DPR filled out at next in office appt. Pt gave verbal ok to s/w with her husband. Pt's husband tells me that they are not sure when the GI office is going to do procedure for his wife. He said they told him that it may be sometime as they are going to do her procedure at [facility] hospital. It is agreeable to hold off scheduling tele pre op appt until they have been given a tentative date for procedure.   I will send message to GI office asking to advise our office when they have a date for procedure so we can then schedule the tele pre op appt.

## 2023-06-16 NOTE — Telephone Encounter (Signed)
   Pre-operative Risk Assessment    Patient Name: Linda Lowe  DOB: Sep 11, 1951 MRN: [id number]    DATE OF LAST VISIT: 03/03/23 DR. Jacinto Halim DATE OF NEXT VISIT: 08/28/23 DR. [facility]  Request for Surgical Clearance    Procedure:   EGD  Date of Surgery:  Clearance TBD                                 Surgeon:  DR. HUNG Surgeon's Group or Practice Name:  [facility] Phone number:  [phone] Fax number:  [phone]   Type of Clearance Requested:   - Medical ; NO MEDICATIONS LISTED AS NEEDING TO BE HELD   Type of Anesthesia:   PROPOFOL   Additional requests/questions:    Elpidio Anis   06/16/2023, 10:39 AM

## 2023-06-16 NOTE — Telephone Encounter (Signed)
   Name: Linda Lowe  DOB: July 10, 1951  MRN: [id number]  Primary Cardiologist: None   Preoperative team, please contact this patient and set up a phone call appointment for further preoperative risk assessment. Please obtain consent and complete medication review. Thank you for your help.  I confirm that guidance regarding antiplatelet and oral anticoagulation therapy has been completed and, if necessary, noted below.  None Requested  I also confirmed the patient resides in the state of [place]. As per [facility] Medical Board telemedicine laws, the patient must reside in the state in which the provider is licensed.   Napoleon Form, Leodis Rains, NP 06/16/2023, 11:09 AM [facility]

## 2023-06-16 NOTE — Telephone Encounter (Signed)
PRIMARY CARD IS DR. Jacinto Halim. Left message to call back to schedule a tele pre op appt.

## 2023-07-11 DIAGNOSIS — I10 Essential (primary) hypertension: Secondary | ICD-10-CM | POA: Diagnosis not present

## 2023-07-11 DIAGNOSIS — I110 Hypertensive heart disease with heart failure: Secondary | ICD-10-CM | POA: Diagnosis not present

## 2023-07-11 DIAGNOSIS — E1165 Type 2 diabetes mellitus with hyperglycemia: Secondary | ICD-10-CM | POA: Diagnosis not present

## 2023-07-11 DIAGNOSIS — I509 Heart failure, unspecified: Secondary | ICD-10-CM | POA: Diagnosis not present

## 2023-07-11 DIAGNOSIS — I5022 Chronic systolic (congestive) heart failure: Secondary | ICD-10-CM | POA: Diagnosis not present

## 2023-07-11 DIAGNOSIS — R600 Localized edema: Secondary | ICD-10-CM | POA: Diagnosis not present

## 2023-07-21 ENCOUNTER — Ambulatory Visit: Payer: Medicare Other | Admitting: Student

## 2023-08-28 ENCOUNTER — Ambulatory Visit: Payer: Self-pay | Admitting: Cardiology

## 2023-09-02 ENCOUNTER — Ambulatory Visit: Payer: TRICARE For Life (TFL) | Admitting: Cardiology

## 2023-10-09 DIAGNOSIS — R262 Difficulty in walking, not elsewhere classified: Secondary | ICD-10-CM | POA: Diagnosis not present

## 2023-10-09 DIAGNOSIS — M6281 Muscle weakness (generalized): Secondary | ICD-10-CM | POA: Diagnosis not present

## 2023-10-09 DIAGNOSIS — G8191 Hemiplegia, unspecified affecting right dominant side: Secondary | ICD-10-CM | POA: Diagnosis not present

## 2023-11-09 DIAGNOSIS — R262 Difficulty in walking, not elsewhere classified: Secondary | ICD-10-CM | POA: Diagnosis not present

## 2023-11-09 DIAGNOSIS — G8191 Hemiplegia, unspecified affecting right dominant side: Secondary | ICD-10-CM | POA: Diagnosis not present

## 2023-11-09 DIAGNOSIS — M6281 Muscle weakness (generalized): Secondary | ICD-10-CM | POA: Diagnosis not present

## 2023-12-07 DIAGNOSIS — R262 Difficulty in walking, not elsewhere classified: Secondary | ICD-10-CM | POA: Diagnosis not present

## 2023-12-07 DIAGNOSIS — G8191 Hemiplegia, unspecified affecting right dominant side: Secondary | ICD-10-CM | POA: Diagnosis not present

## 2023-12-07 DIAGNOSIS — M6281 Muscle weakness (generalized): Secondary | ICD-10-CM | POA: Diagnosis not present

## 2024-01-07 DIAGNOSIS — G8191 Hemiplegia, unspecified affecting right dominant side: Secondary | ICD-10-CM | POA: Diagnosis not present

## 2024-01-07 DIAGNOSIS — M6281 Muscle weakness (generalized): Secondary | ICD-10-CM | POA: Diagnosis not present

## 2024-01-07 DIAGNOSIS — R262 Difficulty in walking, not elsewhere classified: Secondary | ICD-10-CM | POA: Diagnosis not present

## 2024-01-15 ENCOUNTER — Other Ambulatory Visit: Payer: Self-pay | Admitting: Cardiology

## 2024-01-15 DIAGNOSIS — E7800 Pure hypercholesterolemia, unspecified: Secondary | ICD-10-CM

## 2024-02-06 DIAGNOSIS — G8191 Hemiplegia, unspecified affecting right dominant side: Secondary | ICD-10-CM | POA: Diagnosis not present

## 2024-02-06 DIAGNOSIS — R262 Difficulty in walking, not elsewhere classified: Secondary | ICD-10-CM | POA: Diagnosis not present

## 2024-02-06 DIAGNOSIS — M6281 Muscle weakness (generalized): Secondary | ICD-10-CM | POA: Diagnosis not present

## 2024-02-11 ENCOUNTER — Ambulatory Visit: Payer: TRICARE For Life (TFL) | Admitting: Podiatry

## 2024-03-03 ENCOUNTER — Other Ambulatory Visit: Payer: Self-pay | Admitting: Cardiology

## 2024-03-04 ENCOUNTER — Other Ambulatory Visit: Payer: Self-pay | Admitting: Cardiology

## 2024-03-04 DIAGNOSIS — I428 Other cardiomyopathies: Secondary | ICD-10-CM

## 2024-03-05 MED ORDER — IRBESARTAN 300 MG PO TABS: ORAL_TABLET | ORAL | 0 refills | Status: DC

## 2024-03-08 DIAGNOSIS — R262 Difficulty in walking, not elsewhere classified: Secondary | ICD-10-CM | POA: Diagnosis not present

## 2024-03-08 DIAGNOSIS — G8191 Hemiplegia, unspecified affecting right dominant side: Secondary | ICD-10-CM | POA: Diagnosis not present

## 2024-03-08 DIAGNOSIS — M6281 Muscle weakness (generalized): Secondary | ICD-10-CM | POA: Diagnosis not present

## 2024-04-02 ENCOUNTER — Encounter

## 2024-04-13 ENCOUNTER — Other Ambulatory Visit: Payer: Self-pay | Admitting: Cardiology

## 2024-04-13 DIAGNOSIS — E7800 Pure hypercholesterolemia, unspecified: Secondary | ICD-10-CM

## 2024-04-28 ENCOUNTER — Other Ambulatory Visit: Payer: Self-pay

## 2024-04-28 DIAGNOSIS — I428 Other cardiomyopathies: Secondary | ICD-10-CM

## 2024-04-28 MED ORDER — SPIRONOLACTONE 25 MG PO TABS: 25.0000 mg | ORAL_TABLET | Freq: Every day | ORAL | 0 refills | Status: AC

## 2024-05-13 ENCOUNTER — Other Ambulatory Visit: Payer: Self-pay | Admitting: Cardiology

## 2024-05-13 DIAGNOSIS — E7800 Pure hypercholesterolemia, unspecified: Secondary | ICD-10-CM

## 2024-06-01 ENCOUNTER — Other Ambulatory Visit: Payer: Self-pay | Admitting: Cardiology

## 2024-06-14 ENCOUNTER — Other Ambulatory Visit: Payer: Self-pay | Admitting: Cardiology

## 2024-06-14 DIAGNOSIS — E7800 Pure hypercholesterolemia, unspecified: Secondary | ICD-10-CM

## 2024-07-02 ENCOUNTER — Encounter

## 2024-07-21 ENCOUNTER — Other Ambulatory Visit: Payer: Self-pay | Admitting: Cardiology

## 2024-07-21 DIAGNOSIS — E7800 Pure hypercholesterolemia, unspecified: Secondary | ICD-10-CM

## 2024-07-23 ENCOUNTER — Other Ambulatory Visit: Payer: Self-pay | Admitting: Cardiology

## 2024-07-23 DIAGNOSIS — E7800 Pure hypercholesterolemia, unspecified: Secondary | ICD-10-CM

## 2024-07-26 NOTE — Telephone Encounter (Signed)
 Pt of Dr. Ladona. Passed her 3rd attempt. Does Dr Ladona want to refill? Please advise.

## 2024-07-28 MED ORDER — ATORVASTATIN CALCIUM 80 MG PO TABS: ORAL_TABLET | ORAL | 3 refills | Status: AC

## 2024-10-01 ENCOUNTER — Encounter

## 2024-12-31 ENCOUNTER — Encounter

## 2025-04-01 ENCOUNTER — Encounter

## 2025-07-01 ENCOUNTER — Encounter

## 2025-09-30 ENCOUNTER — Encounter

## 2025-12-30 ENCOUNTER — Encounter
